# Patient Record
Sex: Female | Born: 1940
Health system: Southern US, Community
[De-identification: ages and names within clinical notes are randomized; demographics above are authoritative.]

## PROBLEM LIST (undated history)

## (undated) DIAGNOSIS — C801 Malignant (primary) neoplasm, unspecified: Secondary | ICD-10-CM

## (undated) DIAGNOSIS — E785 Hyperlipidemia, unspecified: Secondary | ICD-10-CM

## (undated) DIAGNOSIS — R569 Unspecified convulsions: Secondary | ICD-10-CM

## (undated) DIAGNOSIS — N189 Chronic kidney disease, unspecified: Secondary | ICD-10-CM

## (undated) DIAGNOSIS — M1990 Unspecified osteoarthritis, unspecified site: Secondary | ICD-10-CM

## (undated) DIAGNOSIS — D472 Monoclonal gammopathy: Secondary | ICD-10-CM

## (undated) DIAGNOSIS — I639 Cerebral infarction, unspecified: Secondary | ICD-10-CM

## (undated) DIAGNOSIS — I10 Essential (primary) hypertension: Secondary | ICD-10-CM

## (undated) DIAGNOSIS — E079 Disorder of thyroid, unspecified: Secondary | ICD-10-CM

## (undated) DIAGNOSIS — H269 Unspecified cataract: Secondary | ICD-10-CM

## (undated) HISTORY — PX: THYROID SURGERY: SHX805

## (undated) HISTORY — DX: Cerebral infarction, unspecified: I63.9

## (undated) HISTORY — PX: TUBAL LIGATION: SHX77

## (undated) HISTORY — DX: Unspecified convulsions: R56.9

## (undated) HISTORY — DX: Unspecified osteoarthritis, unspecified site: M19.90

## (undated) HISTORY — DX: Disorder of thyroid, unspecified: E07.9

## (undated) HISTORY — DX: Hyperlipidemia, unspecified: E78.5

## (undated) HISTORY — PX: HERNIA REPAIR: SHX51

## (undated) HISTORY — DX: Chronic kidney disease, unspecified: N18.9

## (undated) HISTORY — PX: EYE SURGERY: SHX253

## (undated) HISTORY — PX: JOINT REPLACEMENT: SHX530

## (undated) HISTORY — DX: Essential (primary) hypertension: I10

## (undated) HISTORY — DX: Malignant (primary) neoplasm, unspecified: C80.1

## (undated) HISTORY — PX: TONSILLECTOMY: SUR1361

## (undated) HISTORY — DX: Unspecified cataract: H26.9

## (undated) HISTORY — PX: CHOLECYSTECTOMY: SHX55

## (undated) HISTORY — PX: TOTAL ABDOMINAL HYSTERECTOMY: SHX209

## (undated) HISTORY — DX: Monoclonal gammopathy: D47.2

---

## 1992-03-10 HISTORY — PX: ROTATOR CUFF REPAIR: SHX139

## 1997-10-25 ENCOUNTER — Ambulatory Visit (HOSPITAL_COMMUNITY): Admission: RE | Admit: 1997-10-25 | Discharge: 1997-10-25 | Payer: Self-pay | Admitting: Cardiology

## 1997-12-18 ENCOUNTER — Other Ambulatory Visit: Admission: RE | Admit: 1997-12-18 | Discharge: 1997-12-18 | Payer: Self-pay | Admitting: Obstetrics and Gynecology

## 1998-03-22 ENCOUNTER — Ambulatory Visit (HOSPITAL_COMMUNITY): Admission: RE | Admit: 1998-03-22 | Discharge: 1998-03-22 | Payer: Self-pay | Admitting: Cardiology

## 1999-01-03 ENCOUNTER — Ambulatory Visit (HOSPITAL_COMMUNITY): Admission: RE | Admit: 1999-01-03 | Discharge: 1999-01-03 | Payer: Self-pay | Admitting: Cardiology

## 1999-01-04 ENCOUNTER — Encounter: Payer: Self-pay | Admitting: Cardiology

## 1999-01-10 ENCOUNTER — Ambulatory Visit (HOSPITAL_COMMUNITY): Admission: RE | Admit: 1999-01-10 | Discharge: 1999-01-10 | Payer: Self-pay | Admitting: Cardiology

## 2000-12-01 ENCOUNTER — Other Ambulatory Visit: Admission: RE | Admit: 2000-12-01 | Discharge: 2000-12-01 | Payer: Self-pay | Admitting: Obstetrics and Gynecology

## 2001-04-07 ENCOUNTER — Encounter: Payer: Self-pay | Admitting: Gastroenterology

## 2001-04-07 ENCOUNTER — Encounter: Admission: RE | Admit: 2001-04-07 | Discharge: 2001-04-07 | Payer: Self-pay | Admitting: Gastroenterology

## 2001-05-10 ENCOUNTER — Encounter: Payer: Self-pay | Admitting: Surgery

## 2001-05-17 ENCOUNTER — Inpatient Hospital Stay (HOSPITAL_COMMUNITY): Admission: RE | Admit: 2001-05-17 | Discharge: 2001-05-20 | Payer: Self-pay | Admitting: Surgery

## 2002-11-09 ENCOUNTER — Ambulatory Visit (HOSPITAL_COMMUNITY): Admission: RE | Admit: 2002-11-09 | Discharge: 2002-11-09 | Payer: Self-pay | Admitting: Gastroenterology

## 2002-11-09 ENCOUNTER — Encounter (INDEPENDENT_AMBULATORY_CARE_PROVIDER_SITE_OTHER): Payer: Self-pay | Admitting: Specialist

## 2002-12-12 ENCOUNTER — Encounter: Admission: RE | Admit: 2002-12-12 | Discharge: 2002-12-12 | Payer: Self-pay | Admitting: Gastroenterology

## 2002-12-12 ENCOUNTER — Encounter: Payer: Self-pay | Admitting: Gastroenterology

## 2003-02-15 ENCOUNTER — Encounter (HOSPITAL_COMMUNITY): Admission: RE | Admit: 2003-02-15 | Discharge: 2003-05-16 | Payer: Self-pay | Admitting: Cardiology

## 2004-05-30 ENCOUNTER — Encounter (HOSPITAL_COMMUNITY): Admission: RE | Admit: 2004-05-30 | Discharge: 2004-08-28 | Payer: Self-pay | Admitting: Cardiology

## 2004-06-11 ENCOUNTER — Inpatient Hospital Stay (HOSPITAL_BASED_OUTPATIENT_CLINIC_OR_DEPARTMENT_OTHER): Admission: RE | Admit: 2004-06-11 | Discharge: 2004-06-11 | Payer: Self-pay | Admitting: Cardiology

## 2009-05-02 ENCOUNTER — Encounter: Admission: RE | Admit: 2009-05-02 | Discharge: 2009-05-02 | Payer: Self-pay | Admitting: Orthopedic Surgery

## 2009-12-08 DIAGNOSIS — N189 Chronic kidney disease, unspecified: Secondary | ICD-10-CM

## 2009-12-08 HISTORY — DX: Chronic kidney disease, unspecified: N18.9

## 2010-02-14 ENCOUNTER — Ambulatory Visit: Payer: Self-pay | Admitting: Oncology

## 2010-03-10 DIAGNOSIS — D472 Monoclonal gammopathy: Secondary | ICD-10-CM

## 2010-03-10 HISTORY — DX: Monoclonal gammopathy: D47.2

## 2010-03-14 LAB — CBC WITH DIFFERENTIAL/PLATELET
BASO%: 1.1 % (ref 0.0–2.0)
Basophils Absolute: 0.1 10*3/uL (ref 0.0–0.1)
EOS%: 2.8 % (ref 0.0–7.0)
Eosinophils Absolute: 0.3 10*3/uL (ref 0.0–0.5)
HCT: 33.3 % — ABNORMAL LOW (ref 34.8–46.6)
HGB: 11.1 g/dL — ABNORMAL LOW (ref 11.6–15.9)
LYMPH%: 34.8 % (ref 14.0–49.7)
MCH: 32.8 pg (ref 25.1–34.0)
MCHC: 33.5 g/dL (ref 31.5–36.0)
MCV: 97.9 fL (ref 79.5–101.0)
MONO#: 0.6 10*3/uL (ref 0.1–0.9)
MONO%: 6.4 % (ref 0.0–14.0)
NEUT#: 5.3 10*3/uL (ref 1.5–6.5)
NEUT%: 54.9 % (ref 38.4–76.8)
Platelets: 303 10*3/uL (ref 145–400)
RBC: 3.4 10*6/uL — ABNORMAL LOW (ref 3.70–5.45)
RDW: 12.5 % (ref 11.2–14.5)
WBC: 9.7 10*3/uL (ref 3.9–10.3)
lymph#: 3.4 10*3/uL — ABNORMAL HIGH (ref 0.9–3.3)

## 2010-03-18 LAB — COMPREHENSIVE METABOLIC PANEL
ALT: 27 U/L (ref 0–35)
AST: 31 U/L (ref 0–37)
Albumin: 4 g/dL (ref 3.5–5.2)
Alkaline Phosphatase: 37 U/L — ABNORMAL LOW (ref 39–117)
BUN: 37 mg/dL — ABNORMAL HIGH (ref 6–23)
CO2: 20 mEq/L (ref 19–32)
Calcium: 9.5 mg/dL (ref 8.4–10.5)
Chloride: 110 mEq/L (ref 96–112)
Creatinine, Ser: 1.92 mg/dL — ABNORMAL HIGH (ref 0.40–1.20)
Glucose, Bld: 96 mg/dL (ref 70–99)
Potassium: 4.6 mEq/L (ref 3.5–5.3)
Sodium: 141 mEq/L (ref 135–145)
Total Bilirubin: 0.4 mg/dL (ref 0.3–1.2)
Total Protein: 6.5 g/dL (ref 6.0–8.3)

## 2010-03-18 LAB — IMMUNOFIXATION ELECTROPHORESIS
IgA: 179 mg/dL (ref 68–378)
IgG (Immunoglobin G), Serum: 981 mg/dL (ref 694–1618)
IgM, Serum: 49 mg/dL — ABNORMAL LOW (ref 60–263)
Total Protein, Serum Electrophoresis: 6.5 g/dL (ref 6.0–8.3)

## 2010-03-18 LAB — LACTATE DEHYDROGENASE: LDH: 98 U/L (ref 94–250)

## 2010-03-18 LAB — CREATININE CLEARANCE, URINE, 24 HOUR
Collection Interval-CRCL: 24 hours
Creatinine Clearance: 36 mL/min — ABNORMAL LOW (ref 75–115)
Creatinine, 24H Ur: 921 mg/d (ref 700–1800)
Creatinine, Urine: 141.7 mg/dL
Creatinine: 1.77 mg/dL — ABNORMAL HIGH (ref 0.40–1.20)
Urine Total Volume-CRCL: 650 mL

## 2010-03-18 LAB — BETA 2 MICROGLOBULIN, SERUM: Beta-2 Microglobulin: 6.34 mg/L — ABNORMAL HIGH (ref 1.01–1.73)

## 2010-03-18 LAB — KAPPA/LAMBDA LIGHT CHAINS
Kappa free light chain: 16.8 mg/dL — ABNORMAL HIGH (ref 0.33–1.94)
Kappa:Lambda Ratio: 15.27 — ABNORMAL HIGH (ref 0.26–1.65)
Lambda Free Lght Chn: 1.1 mg/dL (ref 0.57–2.63)

## 2010-03-20 LAB — UIFE/LIGHT CHAINS/TP QN, 24-HR UR
Albumin, U: DETECTED
Alpha 1, Urine: DETECTED — AB
Alpha 2, Urine: DETECTED — AB
Beta, Urine: DETECTED — AB
Free Kappa Lt Chains,Ur: 9.56 mg/dL — ABNORMAL HIGH (ref 0.04–1.51)
Free Kappa/Lambda Ratio: 35.41 ratio — ABNORMAL HIGH (ref 0.46–4.00)
Free Lambda Excretion/Day: 1.76 mg/d
Free Lambda Lt Chains,Ur: 0.27 mg/dL (ref 0.08–1.01)
Free Lt Chn Excr Rate: 62.14 mg/d
Gamma Globulin, Urine: DETECTED — AB
Time: 24 hours
Total Protein, Urine-Ur/day: 70 mg/d (ref 10–140)
Total Protein, Urine: 10.8 mg/dL
Volume, Urine: 650 mL

## 2010-03-31 ENCOUNTER — Encounter: Payer: Self-pay | Admitting: Cardiology

## 2010-05-10 ENCOUNTER — Encounter (HOSPITAL_BASED_OUTPATIENT_CLINIC_OR_DEPARTMENT_OTHER): Payer: Medicare Other | Admitting: Oncology

## 2010-05-10 ENCOUNTER — Other Ambulatory Visit (HOSPITAL_COMMUNITY): Payer: Self-pay | Admitting: Oncology

## 2010-05-10 DIAGNOSIS — D472 Monoclonal gammopathy: Secondary | ICD-10-CM

## 2010-05-10 DIAGNOSIS — N289 Disorder of kidney and ureter, unspecified: Secondary | ICD-10-CM

## 2010-05-10 DIAGNOSIS — D649 Anemia, unspecified: Secondary | ICD-10-CM

## 2010-05-10 LAB — CBC WITH DIFFERENTIAL/PLATELET
Basophils Absolute: 0 10*3/uL (ref 0.0–0.1)
LYMPH%: 30.6 % (ref 14.0–49.7)
MCHC: 32.8 g/dL (ref 31.5–36.0)
MONO#: 0.5 10*3/uL (ref 0.1–0.9)
MONO%: 5.8 % (ref 0.0–14.0)
Platelets: 282 10*3/uL (ref 145–400)
RBC: 3.37 10*6/uL — ABNORMAL LOW (ref 3.70–5.45)
RDW: 13.3 % (ref 11.2–14.5)
WBC: 9.2 10*3/uL (ref 3.9–10.3)

## 2010-05-10 LAB — IGG, IGA, IGM: IgM, Serum: 50 mg/dL — ABNORMAL LOW (ref 60–263)

## 2010-05-10 LAB — IRON AND TIBC
%SAT: 35 % (ref 20–55)
Iron: 149 ug/dL — ABNORMAL HIGH (ref 42–145)
TIBC: 429 ug/dL (ref 250–470)
UIBC: 280 ug/dL

## 2010-05-10 LAB — COMPREHENSIVE METABOLIC PANEL
AST: 22 U/L (ref 0–37)
Albumin: 4.4 g/dL (ref 3.5–5.2)
BUN: 36 mg/dL — ABNORMAL HIGH (ref 6–23)
Glucose, Bld: 96 mg/dL (ref 70–99)
Potassium: 4.7 mEq/L (ref 3.5–5.3)
Total Bilirubin: 0.4 mg/dL (ref 0.3–1.2)

## 2010-05-10 LAB — SEDIMENTATION RATE: Sed Rate: 17 mm/hr (ref 0–22)

## 2010-07-12 ENCOUNTER — Other Ambulatory Visit (HOSPITAL_COMMUNITY): Payer: Self-pay | Admitting: Oncology

## 2010-07-12 ENCOUNTER — Encounter (HOSPITAL_BASED_OUTPATIENT_CLINIC_OR_DEPARTMENT_OTHER): Payer: Medicare Other | Admitting: Oncology

## 2010-07-12 DIAGNOSIS — E119 Type 2 diabetes mellitus without complications: Secondary | ICD-10-CM

## 2010-07-12 DIAGNOSIS — D649 Anemia, unspecified: Secondary | ICD-10-CM

## 2010-07-12 DIAGNOSIS — N289 Disorder of kidney and ureter, unspecified: Secondary | ICD-10-CM

## 2010-07-12 DIAGNOSIS — D472 Monoclonal gammopathy: Secondary | ICD-10-CM

## 2010-07-12 DIAGNOSIS — E559 Vitamin D deficiency, unspecified: Secondary | ICD-10-CM

## 2010-07-12 LAB — CBC WITH DIFFERENTIAL/PLATELET
BASO%: 0.7 % (ref 0.0–2.0)
EOS%: 2.3 % (ref 0.0–7.0)
MCH: 32.9 pg (ref 25.1–34.0)
MCHC: 33.2 g/dL (ref 31.5–36.0)
MONO#: 0.5 10*3/uL (ref 0.1–0.9)
NEUT#: 5 10*3/uL (ref 1.5–6.5)
RBC: 3.3 10*6/uL — ABNORMAL LOW (ref 3.70–5.45)
RDW: 13.3 % (ref 11.2–14.5)
lymph#: 2.7 10*3/uL (ref 0.9–3.3)

## 2010-07-12 LAB — PROTEIN / CREATININE RATIO, URINE
Creatinine, Urine: 114.4 mg/dL
Protein Creatinine Ratio: 0.06 (ref <0.15)
Total Protein, Urine: 7 mg/dL

## 2010-07-15 LAB — COMPREHENSIVE METABOLIC PANEL
Albumin: 4.2 g/dL (ref 3.5–5.2)
BUN: 34 mg/dL — ABNORMAL HIGH (ref 6–23)
CO2: 16 mEq/L — ABNORMAL LOW (ref 19–32)
Calcium: 9.2 mg/dL (ref 8.4–10.5)
Chloride: 110 mEq/L (ref 96–112)
Potassium: 4.6 mEq/L (ref 3.5–5.3)
Sodium: 144 mEq/L (ref 135–145)
Total Protein: 6.9 g/dL (ref 6.0–8.3)

## 2010-07-15 LAB — IGG, IGA, IGM
IgG (Immunoglobin G), Serum: 959 mg/dL (ref 700–1600)
IgM, Serum: 58 mg/dL — ABNORMAL LOW (ref 60–263)

## 2010-07-15 LAB — KAPPA/LAMBDA LIGHT CHAINS: Lambda Free Lght Chn: 1.32 mg/dL (ref 0.57–2.63)

## 2010-07-26 NOTE — Discharge Summary (Signed)
[facility]  Patient:    Rita Yoder, Rita Yoder [facility] Visit Number: [id number] MRN: [id number]          Service Type: SUR Location: 3W [id number] Attending Physician:  Harl Bowie Dictated by:   Coralie Keens, M.D. Admit Date:  05/17/2001 Discharge Date: 05/20/2001                             Discharge Summary  DISCHARGE DIAGNOSES: 1. Ventral incisional hernia, status post laparoscopic, converted to open,    repair with mesh. 2. Morbid obesity.  HISTORY OF PRESENT ILLNESS:  Rita Yoder is a pleasant 70 year old female who presents with a ventral incisional hernia.  She is admitted for elective repair of this.  HOSPITAL COURSE:  The patient was admitted on May 17, 2001, and was taken to the operating room.  She underwent a laparoscopic, converted to open, ventral hernia repair with mesh.  A 12 x 12 cm Bard composite mesh was used.  She tolerated the procedure well and was taken in stable condition to a regular surgical floor.  Postoperative day 1 she was doing very well and was comfortable.  At this point her Foley catheter was removed, an abdominal binder was placed for comfort.  By postoperative day 2 she was still ambulating poorly but was improving.  Her ileus was resolving, and ambulation was encouraged.  On postoperative day 3 she was tolerating a regular diet, was having bowel movements, was tolerating oral pain medications, her incisions were healing well, and the decision was made to discharge the patient to home.  DISCHARGE INSTRUCTIONS:  Discharge diet:  Regular.  Discharge activity:  She is to do no heavy lifting for six weeks.  DISCHARGE MEDICATIONS:  She will resume her home medications.  She will take Tylox and ibuprofen as pain.  She will take milk of magnesia for constipation. I have put her on Keflex 500 mg three times a day for five days.  She may shower.  She will follow up in my office in five days for staple  removal. Dictated by:   Coralie Keens, M.D. Attending Physician:  Harl Bowie DD:  06/07/01 TD:  06/07/01 Job: [id number]

## 2010-07-26 NOTE — Op Note (Signed)
[facility]  Patient:    Rita Yoder, SALSBERRY [facility] Visit Number: [id number] MRN: [id number]          Service Type: SUR Location: 3W [id number] Attending Physician:  Harl Bowie Dictated by:   Marco Collie Ninfa Linden, M.D. Proc. Date: 05/17/01 Admit Date:  05/17/2001                             Operative Report  PREOPERATIVE DIAGNOSIS:  Ventral incisional hernia.  POSTOPERATIVE DIAGNOSIS:  Ventral incisional hernia.  PROCEDURE:  Laparoscopic converted to open ventral hernia repair with mesh.  SURGEON:  Douglas A. Ninfa Linden, M.D.  ASSISTANT:  Vernona Rieger, M.D.  ANESTHESIA:  General endotracheal.  ESTIMATED BLOOD LOSS:  Minimal.  INDICATIONS:  Rita Yoder is a 70 year old female, who has previous hysterectomy as well as an open cholecystectomy in the past through midline incisions.  She now presents with abdominal pain and an easily reducible ventral hernia.  PROCEDURE IN DETAIL:  The patient was brought to the operating room and identified as Rita Yoder.  She was placed supine on the operating room table, and general anesthesia was induced.  Her abdomen was then prepped and draped in the usual sterile fashion.  Using a #15 blade, a small transverse incision was made in the patients left upper quadrant.  The incision was carried down through the fascial layers with scalpel.  The peritoneum was identified and opened.  A pursestring suture was then placed around the fascia opening with 0 Vicryl.  The Hasson port was placed through the opening and insufflation of the abdomen was begun.  The camera was then inserted, and the patient was found to have a large amount of omentum caked to the abdominal wall.  A separate 5 mm port was placed in the patients epigastrium under direct vision; however, with blunt dissection, the omentum could not be taken away from the abdominal wall safely in order to perform the procedure laparoscopically.  At  this point, the decision was made to terminate the laparoscopic procedure and convert to an open procedure.  The patients midline was then opened around the umbilicus with a 123XX123 scalpel.  Incision was carried down to the hernia sac which was easily identified and opened.  The patients hernia sac was found to contain omentum as well as small intestines. The hernia sac was freed up circumferentially along with adhesions with the electrocautery as well as clamps and silk tied.  The contents could finally be reduced into the abdominal cavity.  Next, a piece of composite mesh from Bard in a 12 x 12 fashion was brought onto the field.  The mesh was passed through the hernia defect and then tacked circumferentially to the fascia.  Then #1 Novofil sutures were used circumferentially to tack the mesh to the fascia as well.  Excellent coverage of the hernia defect appeared to be achieved circumferentially.  Next, the subcutaneous layer was closed with interrupted 3-0 Vicryl sutures, and the skin was closed with skin staples.  The fascial defect at the camera port site was closed with figure-of-eight #1 Novofil sutures as well.  The skin here was closed with skin staples, too.  The patient tolerated the procedure well.  All sponge, needle, and instrument counts were correct at the end of the procedure.  The patient was then extubated in the operating room and taken in stable condition to the recovery room. Dictated by:   Marco Collie  Ninfa Linden, M.D. Attending Physician:  Harl Bowie DD:  05/17/01 TD:  05/17/01 Job: [id number]

## 2010-07-26 NOTE — Op Note (Signed)
NAME:  Rita Yoder, Rita Yoder                      ACCOUNT NO.:  [id number]   MEDICAL RECORD NO.:  [id number]                   PATIENT TYPE:  AMB   LOCATION:  ENDO                                 FACILITY:  [facility]   PHYSICIAN:  Nelwyn Salisbury, M.D.               DATE OF BIRTH:  31-Jul-1940   DATE OF PROCEDURE:  11/09/2002  DATE OF DISCHARGE:                                 OPERATIVE REPORT   PROCEDURE PERFORMED:  Colonoscopy with snare polypectomy times one and cold  biopsies times two.   ENDOSCOPIST:  Juanita Craver, M.D.   INSTRUMENT USED:  Olympus video colonoscope.   INDICATIONS FOR PROCEDURE:  The patient is a 70 year old African-American  female with history of change in bowel habits.  Rule out colonic polyps,  masses, etc.   PREPROCEDURE PREPARATION:  Informed consent was procured from the patient.  The patient was fasted for eight hours prior to the procedure and prepped  with a bottle of magnesium citrate and a gallon of GoLYTELY the night prior  to the procedure.   PREPROCEDURE PHYSICAL:  The patient had stable vital signs.  Neck supple.  Chest clear to auscultation.  S1 and S2 regular.  Abdomen soft with normal  bowel sounds.   DESCRIPTION OF PROCEDURE:  The patient was placed in left lateral decubitus  position and sedated with an additional 30 mg of Demerol and 3 mg of Versed  intravenously.  Once the patient was adequately sedated and maintained on  low flow oxygen and continuous cardiac monitoring, the Olympus video  colonoscope was advanced form the rectum to the cecum with difficulty.  There was a large amount of residual stool in the colon and multiple washes  were done.  A small sessile polyp was snared from the proximal right colon  but was lost in the stool.  Two small sessile polyps were biopsied from the  cecum.  There was evidence of scattered diverticulosis especially in the  left colon.  No large masses or polyps were present.  Retroflexion in the  rectum  revealed no abnormalities.   IMPRESSION:  1. Multiple colonic polyps (see description above) removed by biopsies and     snare polypectomy.  2. Scattered diverticulosis with more prominent changes in the sigmoid     colon.  3. Significant amount of residual stool in the colon, small lesions could     have been missed.   RECOMMENDATIONS:  1. Await pathology results.  2. A high fiber diet with liberal fluid intake.  3. Outpatient follow-up in the next two weeks for further recommendations.                                                   Nelwyn Salisbury, M.D.    JNM/MEDQ  D:  11/09/2002  T:  11/10/2002  Job:  [id number]   cc:   Allegra Lai. Terrence Dupont, M.D.  [address]  [place] [address]  Fax: [phone]

## 2010-07-26 NOTE — Cardiovascular Report (Signed)
Rita Yoder, Rita Yoder              ACCOUNT NO.:  [id number]   MEDICAL RECORD NO.:  [id number]          PATIENT TYPE:  OIB   LOCATION:  6501                         FACILITY:  [place]   PHYSICIAN:  Mohan N. Terrence Dupont, M.D. DATE OF BIRTH:  10/31/1940   DATE OF PROCEDURE:  06/11/2004  DATE OF DISCHARGE:                              CARDIAC CATHETERIZATION   PROCEDURE:  Left cardiac cath with selective left and right coronary  angiograph, left ventriculography via right groin using Judkins technique.   INDICATIONS FOR PROCEDURE:  Rita Yoder is a 70 year old black female with  past medical history significant for hypertension, history of mitral valve  prolapse, morbid obesity, hiatus hernia, hypercholesteremia, non-insulin-  dependent diabetes mellitus controlled by diet. Complains of retrosternal  chest pain off and on grade 3/10, relieved with sublingual nitro without  associated nausea, vomiting or diaphoresis. States chest pain lasts for 10-  15 minutes. Denies relation of chest pain to food, breathing or lying down.  The patient underwent Persantine Myoview on May 30, 2004, which showed  reversible ischemia of the anterior wall with EF of 61%. Due to multiple  risk factors and chest pain and positive Persantine Myoview, I discussed  with the patient regarding left cath and consents for the procedure.   PAST MEDICAL HISTORY:  As above.   PAST SURGICAL HISTORY:  1.  She had right shoulder surgery right shoulder rotator cuff repair in      1994.  2.  Subtotally pallidectomy 30+ years ago.  3.  Hysterectomy for fibroid tumor many years ago.   MEDICATIONS AT HOME:  1.  Toprol XL 100 mg p.o. daily.  2.  Avalide 300/12.5 mg p.o. daily.  3.  Norvasc 5 mg  p.o. daily.  4.  Cardura 4 mg p.o. q.h.s.  5.  Ditropan XL 10 mg p.o. daily.  6.  Lipitor 40 mg p.o. daily.  7.  Baby aspirin 81 mg p.o. daily.  8.  Nitrostat sublingual p.r.n.   ALLERGY:  No known drug allergies.   She is  married. His four children.  Born in [place], lives in  [place], [place].  No history of smoking or alcohol abuse.   FAMILY HISTORY:  Negative for coronary artery disease.   PHYSICAL EXAMINATION:  GENERAL:  She is alert, awake and oriented x3 in no  acute distress.  VITAL SIGNS:  Blood pressure was 140/80, pulse was 52 regular and  conjunctiva pink.  NECK: Supple. No JVD, no bruit.  LUNGS:  Clear to auscultation without rhonchi or rales.  CARDIOVASCULAR:  S1 and S2 was soft. There was no S3 gallop.  ABDOMEN:  Soft. Bowel sounds present, nontender.  EXTREMITIES: There is no clubbing, cyanosis or edema.   Discussed with the patient regarding abnormal Persantine Myoview and left  cath, its risks, i.e. death, MI, stroke, need for emergency CABG, local  vascular complications, etc. and consented for the procedure.   PROCEDURE:  After obtaining informed consent the patient was brought to the  cath lab and was placed on fluoroscopy table. Right groin was prepped and  draped in usual fashion.  Xylocaine 2% was used for local anesthesia in the  right groin.  With the help of thin-wall needle, 4-French arterial sheath  was placed. The sheath was aspirated and flushed. Next, 4-French left  Judkins catheter was advanced over the wire under fluoroscopic guidance up  to the ascending aorta. Wire was pulled out, the catheter was aspirated and  connected to the manifold. Catheter was further advanced and engaged into  left coronary ostium. Multiple views of the left system were taken. Next,  the catheter was disengaged and was pulled out over the wire was replaced  with 4-French right Judkins catheter which was advanced over the wire under  fluoroscopic guidance into the ascending aorta. Wire was pulled out, the  catheter was aspirated and connected to the manifold. Catheter was further  advanced and engaged into right coronary ostium.  Multiple views of the  right system were taken.  Next, the catheter was disengaged and was pulled  out over the wire was replaced with 4-French pigtail catheter which was  advanced over the wire under fluoroscopic guidance up to the ascending  aorta.  The wire was pulled out, the catheter was aspirated and connected to  the manifold. Catheter was further advanced across aortic valve into the LV.  LV pressures were recorded. Next, left ventriculography was done in 30  degree RAO position. Post angiographic pressures were recorded from LV and  then pullback pressures were recorded from the aorta. There was no gradient  across aortic valve. Next, a pigtail catheter was pulled out over the wire.  Sheaths aspirated and flushed.   FINDINGS:  LV showed good LV systolic function, EF of 99991111. There was 2+  MR.  Left main was long which was patent. LAD had 10-15% mid stenosis.  Diagonal I was moderate size which was patent. Diagonal II and III were  small which were patent. Left circumflex was patent. OM I was patent. Left  circumflex was patent. The patient has right dominant coronary system. The  patient tolerated procedure well. There were no complications. The patient  was transferred to recovery room in stable condition.      MNH/MEDQ  D:  06/11/2004  T:  06/11/2004  Job:  [id number]   cc:   Cath Lab

## 2010-07-26 NOTE — Op Note (Signed)
   NAME:  Rita Yoder, Rita Yoder                      ACCOUNT NO.:  [id number]   MEDICAL RECORD NO.:  [id number]                   PATIENT TYPE:  AMB   LOCATION:  ENDO                                 FACILITY:  [facility]   PHYSICIAN:  Nelwyn Salisbury, M.D.               DATE OF BIRTH:  Jun 07, 1940   DATE OF PROCEDURE:  11/09/2002  DATE OF DISCHARGE:                                 OPERATIVE REPORT   PROCEDURE PERFORMED:  Esophagogastroduodenoscopy.   ENDOSCOPIST:  Nelwyn Salisbury, M.D.   INSTRUMENT USED:  Olympus video panendoscope.   INDICATION FOR PROCEDURE:  A 70 year old African-American female with a  history of epigastric pain, status post cholecystectomy.  Rule out peptic  ulcer disease, esophagitis, gastritis, etc.   PREPROCEDURE PREPARATION:  Informed consent was procured from the patient.  The patient was fasted for eight hours prior to the procedure.   PREPROCEDURE PHYSICAL:  VITAL SIGNS:  The patient had stable vital signs.  NECK:  Supple.  CHEST:  Clear to auscultation.  S1, S2 regular.  ABDOMEN:  Obese with normal bowel sounds.   DESCRIPTION OF PROCEDURE:  The patient was placed in the left lateral  decubitus position and sedated with 100 mg of Demerol and 10 mg of Versed in  slow incremental doses.  Once the patient was adequately sedate and  maintained on low-flow oxygen and continuous cardiac monitoring, the Olympus  video panendoscope was advanced through the mouthpiece, over the tongue,  into the esophagus under direct vision.  The entire esophagus appeared  normal with a healthy Z-line.  No esophagitis, Barrett's mucosa, or  stricture was seen.  The scope was then advanced to the stomach.  There was  antral gastritis noted.  Retroflexion in the high cardia did not reveal a  hiatal hernia.  The rest of the gastric mucosa appeared healthy, and so did  the proximal small bowel.   IMPRESSION:  Mild antral gastritis, otherwise normal  esophagogastroduodenoscopy.    RECOMMENDATIONS:  Proceed with a colonoscopy at this time.  Further  recommendations will be made thereafter.                                               Nelwyn Salisbury, M.D.    JNM/MEDQ  D:  11/09/2002  T:  11/10/2002  Job:  [id number]   cc:   Allegra Lai. Terrence Dupont, M.D.  [address]  [place] [address]  Fax: [phone]

## 2010-10-18 ENCOUNTER — Other Ambulatory Visit (HOSPITAL_COMMUNITY): Payer: Self-pay | Admitting: Oncology

## 2010-10-18 ENCOUNTER — Encounter (HOSPITAL_BASED_OUTPATIENT_CLINIC_OR_DEPARTMENT_OTHER): Payer: Medicare Other | Admitting: Oncology

## 2010-10-18 DIAGNOSIS — D472 Monoclonal gammopathy: Secondary | ICD-10-CM

## 2010-10-18 DIAGNOSIS — N289 Disorder of kidney and ureter, unspecified: Secondary | ICD-10-CM

## 2010-10-18 DIAGNOSIS — D649 Anemia, unspecified: Secondary | ICD-10-CM

## 2010-10-18 LAB — PROTEIN / CREATININE RATIO, URINE
Creatinine, Urine: 215.7 mg/dL
Protein Creatinine Ratio: 0.07 (ref <0.15)
Total Protein, Urine: 15 mg/dL

## 2010-10-18 LAB — CBC WITH DIFFERENTIAL/PLATELET
BASO%: 0.4 % (ref 0.0–2.0)
Basophils Absolute: 0 10*3/uL (ref 0.0–0.1)
EOS%: 2.7 % (ref 0.0–7.0)
Eosinophils Absolute: 0.2 10*3/uL (ref 0.0–0.5)
MCH: 31.6 pg (ref 25.1–34.0)
MCHC: 31.8 g/dL (ref 31.5–36.0)
MONO#: 0.4 10*3/uL (ref 0.1–0.9)
RBC: 3.35 10*6/uL — ABNORMAL LOW (ref 3.70–5.45)
RDW: 13 % (ref 11.2–14.5)

## 2010-10-21 LAB — KAPPA/LAMBDA LIGHT CHAINS: Kappa free light chain: 40.7 mg/dL — ABNORMAL HIGH (ref 0.33–1.94)

## 2010-10-21 LAB — IGG, IGA, IGM: IgG (Immunoglobin G), Serum: 1050 mg/dL (ref 690–1700)

## 2010-10-21 LAB — LACTATE DEHYDROGENASE: LDH: 114 U/L (ref 94–250)

## 2010-10-21 LAB — COMPREHENSIVE METABOLIC PANEL
ALT: 20 U/L (ref 0–35)
AST: 23 U/L (ref 0–37)
Albumin: 4.3 g/dL (ref 3.5–5.2)
CO2: 21 mEq/L (ref 19–32)
Calcium: 9.7 mg/dL (ref 8.4–10.5)
Chloride: 109 mEq/L (ref 96–112)
Creatinine, Ser: 1.44 mg/dL — ABNORMAL HIGH (ref 0.50–1.10)
Potassium: 4.4 mEq/L (ref 3.5–5.3)
Sodium: 144 mEq/L (ref 135–145)
Total Protein: 6.8 g/dL (ref 6.0–8.3)

## 2010-12-25 ENCOUNTER — Encounter: Payer: Self-pay | Admitting: Medical Oncology

## 2011-01-20 ENCOUNTER — Other Ambulatory Visit: Payer: Medicare Other | Admitting: Lab

## 2011-01-20 ENCOUNTER — Ambulatory Visit: Payer: Medicare Other | Admitting: Oncology

## 2013-04-20 DIAGNOSIS — N183 Chronic kidney disease, stage 3 unspecified: Secondary | ICD-10-CM | POA: Insufficient documentation

## 2013-05-19 ENCOUNTER — Ambulatory Visit: Payer: Self-pay | Admitting: Podiatry

## 2015-01-18 DIAGNOSIS — M545 Low back pain: Secondary | ICD-10-CM | POA: Diagnosis not present

## 2015-01-18 DIAGNOSIS — N08 Glomerular disorders in diseases classified elsewhere: Secondary | ICD-10-CM | POA: Diagnosis not present

## 2015-01-18 DIAGNOSIS — R634 Abnormal weight loss: Secondary | ICD-10-CM | POA: Diagnosis not present

## 2015-01-18 DIAGNOSIS — E1122 Type 2 diabetes mellitus with diabetic chronic kidney disease: Secondary | ICD-10-CM | POA: Diagnosis not present

## 2015-01-18 DIAGNOSIS — N183 Chronic kidney disease, stage 3 (moderate): Secondary | ICD-10-CM | POA: Diagnosis not present

## 2015-01-18 DIAGNOSIS — E559 Vitamin D deficiency, unspecified: Secondary | ICD-10-CM | POA: Diagnosis not present

## 2015-01-18 DIAGNOSIS — I129 Hypertensive chronic kidney disease with stage 1 through stage 4 chronic kidney disease, or unspecified chronic kidney disease: Secondary | ICD-10-CM | POA: Diagnosis not present

## 2015-04-18 DIAGNOSIS — H6121 Impacted cerumen, right ear: Secondary | ICD-10-CM | POA: Diagnosis not present

## 2015-04-18 DIAGNOSIS — E559 Vitamin D deficiency, unspecified: Secondary | ICD-10-CM | POA: Diagnosis not present

## 2015-04-18 DIAGNOSIS — R5383 Other fatigue: Secondary | ICD-10-CM | POA: Diagnosis not present

## 2015-04-18 DIAGNOSIS — I129 Hypertensive chronic kidney disease with stage 1 through stage 4 chronic kidney disease, or unspecified chronic kidney disease: Secondary | ICD-10-CM | POA: Diagnosis not present

## 2015-04-18 DIAGNOSIS — R51 Headache: Secondary | ICD-10-CM | POA: Diagnosis not present

## 2015-07-05 DIAGNOSIS — N183 Chronic kidney disease, stage 3 (moderate): Secondary | ICD-10-CM | POA: Diagnosis not present

## 2015-07-05 DIAGNOSIS — I129 Hypertensive chronic kidney disease with stage 1 through stage 4 chronic kidney disease, or unspecified chronic kidney disease: Secondary | ICD-10-CM | POA: Diagnosis not present

## 2015-07-31 DIAGNOSIS — E1122 Type 2 diabetes mellitus with diabetic chronic kidney disease: Secondary | ICD-10-CM | POA: Diagnosis not present

## 2015-07-31 DIAGNOSIS — I129 Hypertensive chronic kidney disease with stage 1 through stage 4 chronic kidney disease, or unspecified chronic kidney disease: Secondary | ICD-10-CM | POA: Diagnosis not present

## 2015-07-31 DIAGNOSIS — N08 Glomerular disorders in diseases classified elsewhere: Secondary | ICD-10-CM | POA: Diagnosis not present

## 2015-07-31 DIAGNOSIS — N183 Chronic kidney disease, stage 3 (moderate): Secondary | ICD-10-CM | POA: Diagnosis not present

## 2015-08-08 DIAGNOSIS — E119 Type 2 diabetes mellitus without complications: Secondary | ICD-10-CM | POA: Diagnosis not present

## 2015-08-08 DIAGNOSIS — N182 Chronic kidney disease, stage 2 (mild): Secondary | ICD-10-CM | POA: Diagnosis not present

## 2015-08-08 DIAGNOSIS — I10 Essential (primary) hypertension: Secondary | ICD-10-CM | POA: Diagnosis not present

## 2015-08-16 DIAGNOSIS — M25552 Pain in left hip: Secondary | ICD-10-CM | POA: Diagnosis not present

## 2015-09-14 DIAGNOSIS — M25551 Pain in right hip: Secondary | ICD-10-CM | POA: Diagnosis not present

## 2015-11-16 DIAGNOSIS — M25552 Pain in left hip: Secondary | ICD-10-CM | POA: Diagnosis not present

## 2015-12-24 DIAGNOSIS — Z79899 Other long term (current) drug therapy: Secondary | ICD-10-CM | POA: Diagnosis not present

## 2015-12-24 DIAGNOSIS — N184 Chronic kidney disease, stage 4 (severe): Secondary | ICD-10-CM | POA: Diagnosis not present

## 2015-12-24 DIAGNOSIS — N183 Chronic kidney disease, stage 3 (moderate): Secondary | ICD-10-CM | POA: Diagnosis not present

## 2015-12-24 DIAGNOSIS — Z0000 Encounter for general adult medical examination without abnormal findings: Secondary | ICD-10-CM | POA: Diagnosis not present

## 2015-12-24 DIAGNOSIS — I129 Hypertensive chronic kidney disease with stage 1 through stage 4 chronic kidney disease, or unspecified chronic kidney disease: Secondary | ICD-10-CM | POA: Diagnosis not present

## 2015-12-24 DIAGNOSIS — M25552 Pain in left hip: Secondary | ICD-10-CM | POA: Diagnosis not present

## 2015-12-24 DIAGNOSIS — E1122 Type 2 diabetes mellitus with diabetic chronic kidney disease: Secondary | ICD-10-CM | POA: Diagnosis not present

## 2015-12-31 DIAGNOSIS — Z1231 Encounter for screening mammogram for malignant neoplasm of breast: Secondary | ICD-10-CM | POA: Diagnosis not present

## 2016-02-21 ENCOUNTER — Encounter: Payer: Self-pay | Admitting: Podiatry

## 2016-02-21 ENCOUNTER — Ambulatory Visit (INDEPENDENT_AMBULATORY_CARE_PROVIDER_SITE_OTHER): Payer: Medicare Other | Admitting: Podiatry

## 2016-02-21 ENCOUNTER — Ambulatory Visit (INDEPENDENT_AMBULATORY_CARE_PROVIDER_SITE_OTHER): Payer: Medicare Other

## 2016-02-21 VITALS — BP 161/78 | HR 63 | Resp 16 | Ht 61.0 in | Wt 235.0 lb

## 2016-02-21 DIAGNOSIS — E1140 Type 2 diabetes mellitus with diabetic neuropathy, unspecified: Secondary | ICD-10-CM

## 2016-02-21 DIAGNOSIS — S99921A Unspecified injury of right foot, initial encounter: Secondary | ICD-10-CM

## 2016-02-21 DIAGNOSIS — M779 Enthesopathy, unspecified: Secondary | ICD-10-CM

## 2016-02-21 DIAGNOSIS — M79676 Pain in unspecified toe(s): Secondary | ICD-10-CM

## 2016-02-21 DIAGNOSIS — E1149 Type 2 diabetes mellitus with other diabetic neurological complication: Secondary | ICD-10-CM | POA: Diagnosis not present

## 2016-02-21 DIAGNOSIS — M79604 Pain in right leg: Secondary | ICD-10-CM

## 2016-02-21 DIAGNOSIS — M79605 Pain in left leg: Secondary | ICD-10-CM

## 2016-02-21 DIAGNOSIS — B351 Tinea unguium: Secondary | ICD-10-CM

## 2016-02-21 MED ORDER — TRIAMCINOLONE ACETONIDE 10 MG/ML IJ SUSP
10.0000 mg | Freq: Once | INTRAMUSCULAR | Status: AC
  Administered 2016-02-21: 10 mg

## 2016-02-21 NOTE — Progress Notes (Signed)
   Subjective:    Patient ID: Rita Yoder, female    DOB: 02/13/1941, 75 y.o.   MRN: [id number]  HPI Chief Complaint  Patient presents with  . Foot Injury    Right foot; dorsal; swelling; pt stated, "[place] when trying to get into her car on February 08, 2016"  . Debridement    Bilateral nail trim; pt Diabetic Type 2; Sugar=did not check today; A1C=Does not know      Review of Systems  Constitutional: Positive for fatigue and unexpected weight change.  HENT: Positive for hearing loss.   Cardiovascular: Positive for leg swelling.  Gastrointestinal: Positive for diarrhea.  Endocrine: Positive for cold intolerance and polydipsia.  Genitourinary: Positive for urgency.  Musculoskeletal: Positive for myalgias.  Hematological: Bruises/bleeds easily.  All other systems reviewed and are negative.      Objective:   Physical Exam        Assessment & Plan:

## 2016-02-22 NOTE — Progress Notes (Signed)
Subjective:     Patient ID: Rita Yoder, female   DOB: [birth year], 75 y.o.   MRN: [id number]  HPI patient states she's a long-term diabetic who has pain in the right ankle nail disease that she cannot cut and become bothersome and also needs diabetic shoes and she's had them in the past and they have effectively helped her structure   Review of Systems  All other systems reviewed and are negative.      Objective:   Physical Exam  Constitutional: She is oriented to person, place, and time.  Cardiovascular: Intact distal pulses.   Musculoskeletal: Normal range of motion.  Neurological: She is oriented to person, place, and time.  Skin: Skin is warm and dry.  Nursing note and vitals reviewed.  neurovascular status was found to be diminished with diminished DP PT pulses and patient also noted to have diminished sharp [employer] vibratory. Patient's noted to have digital deformities with rotation keratotic lesion formation that can become painful and also is noted to have nail disease 1-5 both feet that become thickened incurvated and she cannot cut and states that are painful. She has quite a bit of discomfort in the right ankle within the sinus tarsi of a ankle sprain several weeks ago     Assessment:     Sprained ankle diabetic risk patient with digital deformities and diminished neurological vascular status along with nail disease and pain in thickness 1-5 both feet    Plan:     H&P diabetic education rendered and today I went ahead and I debrided nailbeds 1-5 both feet with no iatrogenic bleeding noted and debrided lesions and injected the sinus tarsi right 3 mg Kenalog 5 mg Xylocaine mixture we will get her approved for diabetic shoes  X-ray report indicates there is quite a bit of osteoporosis collapse medial longitudinal arch arthritis but no indication of fracture

## 2016-02-27 DIAGNOSIS — R350 Frequency of micturition: Secondary | ICD-10-CM | POA: Diagnosis not present

## 2016-04-02 ENCOUNTER — Telehealth: Payer: Self-pay | Admitting: *Deleted

## 2016-04-03 NOTE — Telephone Encounter (Signed)
Pt's dtr, Para Skeans states pt received a shot in her ankle at last appt, and had bruising. Ms Sales states pt now has a knot on the bottom of the same foot and wanted to know if pt needed to see a podiatrist. I told her yes a podiatrist would be able to evaluate, and took her the the back reception schedulers to set up an appt.

## 2016-04-10 ENCOUNTER — Ambulatory Visit: Payer: Medicare Other | Admitting: Podiatry

## 2016-05-13 ENCOUNTER — Ambulatory Visit (INDEPENDENT_AMBULATORY_CARE_PROVIDER_SITE_OTHER): Payer: Medicare Other | Admitting: *Deleted

## 2016-05-13 DIAGNOSIS — E1140 Type 2 diabetes mellitus with diabetic neuropathy, unspecified: Secondary | ICD-10-CM

## 2016-05-13 DIAGNOSIS — E1149 Type 2 diabetes mellitus with other diabetic neurological complication: Principal | ICD-10-CM

## 2016-05-13 NOTE — Progress Notes (Signed)
Patient ID: Rita Yoder, female   DOB: 1940/05/21, 76 y.o.   MRN: [id number]  Patient presents to be measured and molded for diabetic shoes and inserts. Ordering [employer]pex Ethel Rana A330W black in 9 wide.

## 2016-06-19 ENCOUNTER — Ambulatory Visit: Payer: Medicare Other

## 2016-06-30 ENCOUNTER — Other Ambulatory Visit: Payer: Medicare Other

## 2016-07-14 ENCOUNTER — Ambulatory Visit (INDEPENDENT_AMBULATORY_CARE_PROVIDER_SITE_OTHER): Payer: Self-pay | Admitting: Podiatry

## 2016-07-14 DIAGNOSIS — E1142 Type 2 diabetes mellitus with diabetic polyneuropathy: Secondary | ICD-10-CM | POA: Diagnosis not present

## 2016-07-14 DIAGNOSIS — M7750 Other enthesopathy of unspecified foot: Secondary | ICD-10-CM | POA: Diagnosis not present

## 2016-07-14 NOTE — Progress Notes (Signed)
Patient presents today to pick up diabetic shoes.  Patient tried on shoes and they fit her well...no concern of heel slippage or areas of pressure...full contact foot orthotics check for fit as well..patient pleased with out come..  Charges a5000 x 2  And a5513 x 6

## 2016-07-17 DIAGNOSIS — I10 Essential (primary) hypertension: Secondary | ICD-10-CM | POA: Diagnosis not present

## 2016-07-17 DIAGNOSIS — N182 Chronic kidney disease, stage 2 (mild): Secondary | ICD-10-CM | POA: Diagnosis not present

## 2016-07-17 DIAGNOSIS — E119 Type 2 diabetes mellitus without complications: Secondary | ICD-10-CM | POA: Diagnosis not present

## 2016-08-07 DIAGNOSIS — M25552 Pain in left hip: Secondary | ICD-10-CM | POA: Diagnosis not present

## 2016-08-18 DIAGNOSIS — Z1389 Encounter for screening for other disorder: Secondary | ICD-10-CM | POA: Diagnosis not present

## 2016-08-18 DIAGNOSIS — I129 Hypertensive chronic kidney disease with stage 1 through stage 4 chronic kidney disease, or unspecified chronic kidney disease: Secondary | ICD-10-CM | POA: Diagnosis not present

## 2016-08-18 DIAGNOSIS — R7309 Other abnormal glucose: Secondary | ICD-10-CM | POA: Diagnosis not present

## 2016-08-18 DIAGNOSIS — R296 Repeated falls: Secondary | ICD-10-CM | POA: Diagnosis not present

## 2016-08-18 DIAGNOSIS — N183 Chronic kidney disease, stage 3 (moderate): Secondary | ICD-10-CM | POA: Diagnosis not present

## 2016-08-21 DIAGNOSIS — R2689 Other abnormalities of gait and mobility: Secondary | ICD-10-CM | POA: Diagnosis not present

## 2016-12-08 DIAGNOSIS — M25552 Pain in left hip: Secondary | ICD-10-CM | POA: Diagnosis not present

## 2017-01-14 DIAGNOSIS — N08 Glomerular disorders in diseases classified elsewhere: Secondary | ICD-10-CM | POA: Diagnosis not present

## 2017-01-14 DIAGNOSIS — I129 Hypertensive chronic kidney disease with stage 1 through stage 4 chronic kidney disease, or unspecified chronic kidney disease: Secondary | ICD-10-CM | POA: Diagnosis not present

## 2017-01-14 DIAGNOSIS — Z1239 Encounter for other screening for malignant neoplasm of breast: Secondary | ICD-10-CM | POA: Diagnosis not present

## 2017-01-14 DIAGNOSIS — E782 Mixed hyperlipidemia: Secondary | ICD-10-CM | POA: Diagnosis not present

## 2017-01-14 DIAGNOSIS — N183 Chronic kidney disease, stage 3 (moderate): Secondary | ICD-10-CM | POA: Diagnosis not present

## 2017-01-14 DIAGNOSIS — Z0100 Encounter for examination of eyes and vision without abnormal findings: Secondary | ICD-10-CM | POA: Diagnosis not present

## 2017-01-14 DIAGNOSIS — R229 Localized swelling, mass and lump, unspecified: Secondary | ICD-10-CM | POA: Diagnosis not present

## 2017-01-14 DIAGNOSIS — Z0000 Encounter for general adult medical examination without abnormal findings: Secondary | ICD-10-CM | POA: Diagnosis not present

## 2017-02-04 ENCOUNTER — Other Ambulatory Visit: Payer: Self-pay | Admitting: Internal Medicine

## 2017-02-04 DIAGNOSIS — E2839 Other primary ovarian failure: Secondary | ICD-10-CM

## 2017-02-07 DIAGNOSIS — Z1231 Encounter for screening mammogram for malignant neoplasm of breast: Secondary | ICD-10-CM | POA: Diagnosis not present

## 2017-02-23 ENCOUNTER — Ambulatory Visit
Admission: RE | Admit: 2017-02-23 | Discharge: 2017-02-23 | Disposition: A | Discharge status: Home / Self Care | Payer: Medicare Other | Source: Ambulatory Visit | Attending: Internal Medicine | Admitting: Internal Medicine

## 2017-02-23 DIAGNOSIS — Z780 Asymptomatic menopausal state: Secondary | ICD-10-CM | POA: Diagnosis not present

## 2017-02-23 DIAGNOSIS — E2839 Other primary ovarian failure: Secondary | ICD-10-CM

## 2017-02-23 DIAGNOSIS — M85851 Other specified disorders of bone density and structure, right thigh: Secondary | ICD-10-CM | POA: Diagnosis not present

## 2017-06-18 DIAGNOSIS — N183 Chronic kidney disease, stage 3 (moderate): Secondary | ICD-10-CM | POA: Diagnosis not present

## 2017-06-18 DIAGNOSIS — M1612 Unilateral primary osteoarthritis, left hip: Secondary | ICD-10-CM | POA: Diagnosis not present

## 2017-06-18 DIAGNOSIS — R946 Abnormal results of thyroid function studies: Secondary | ICD-10-CM | POA: Diagnosis not present

## 2017-06-18 DIAGNOSIS — I129 Hypertensive chronic kidney disease with stage 1 through stage 4 chronic kidney disease, or unspecified chronic kidney disease: Secondary | ICD-10-CM | POA: Diagnosis not present

## 2017-07-07 DIAGNOSIS — H524 Presbyopia: Secondary | ICD-10-CM | POA: Diagnosis not present

## 2017-07-07 DIAGNOSIS — H43811 Vitreous degeneration, right eye: Secondary | ICD-10-CM | POA: Diagnosis not present

## 2017-07-07 DIAGNOSIS — H538 Other visual disturbances: Secondary | ICD-10-CM | POA: Diagnosis not present

## 2017-07-07 DIAGNOSIS — H2510 Age-related nuclear cataract, unspecified eye: Secondary | ICD-10-CM | POA: Diagnosis not present

## 2017-08-20 DIAGNOSIS — I129 Hypertensive chronic kidney disease with stage 1 through stage 4 chronic kidney disease, or unspecified chronic kidney disease: Secondary | ICD-10-CM | POA: Diagnosis not present

## 2017-08-20 DIAGNOSIS — Z1389 Encounter for screening for other disorder: Secondary | ICD-10-CM | POA: Diagnosis not present

## 2017-08-20 DIAGNOSIS — N183 Chronic kidney disease, stage 3 (moderate): Secondary | ICD-10-CM | POA: Diagnosis not present

## 2017-08-20 DIAGNOSIS — M1612 Unilateral primary osteoarthritis, left hip: Secondary | ICD-10-CM | POA: Diagnosis not present

## 2017-08-20 DIAGNOSIS — E039 Hypothyroidism, unspecified: Secondary | ICD-10-CM | POA: Diagnosis not present

## 2017-12-14 ENCOUNTER — Other Ambulatory Visit: Payer: Self-pay

## 2017-12-14 MED ORDER — SYNTHROID 25 MCG PO TABS: 25.0000 ug | ORAL_TABLET | Freq: Every day | ORAL | 1 refills | Status: DC

## 2018-01-20 ENCOUNTER — Encounter: Payer: Self-pay | Admitting: Internal Medicine

## 2018-01-20 ENCOUNTER — Ambulatory Visit (INDEPENDENT_AMBULATORY_CARE_PROVIDER_SITE_OTHER): Payer: Medicare Other | Admitting: Internal Medicine

## 2018-01-20 ENCOUNTER — Ambulatory Visit: Payer: Medicare Other

## 2018-01-20 VITALS — BP 130/70 | HR 60 | Temp 97.8°F | Ht 60.0 in | Wt 229.8 lb

## 2018-01-20 DIAGNOSIS — R7309 Other abnormal glucose: Secondary | ICD-10-CM

## 2018-01-20 DIAGNOSIS — Z6841 Body Mass Index (BMI) 40.0 and over, adult: Secondary | ICD-10-CM

## 2018-01-20 DIAGNOSIS — M1612 Unilateral primary osteoarthritis, left hip: Secondary | ICD-10-CM

## 2018-01-20 DIAGNOSIS — I10 Essential (primary) hypertension: Secondary | ICD-10-CM

## 2018-01-20 DIAGNOSIS — E039 Hypothyroidism, unspecified: Secondary | ICD-10-CM

## 2018-01-20 DIAGNOSIS — Z0000 Encounter for general adult medical examination without abnormal findings: Secondary | ICD-10-CM

## 2018-01-20 LAB — POCT URINALYSIS DIPSTICK
Bilirubin, UA: NEGATIVE
Blood, UA: NEGATIVE
GLUCOSE UA: NEGATIVE
Ketones, UA: NEGATIVE
LEUKOCYTES UA: NEGATIVE
Nitrite, UA: NEGATIVE
Protein, UA: NEGATIVE
SPEC GRAV UA: 1.02 (ref 1.010–1.025)
Urobilinogen, UA: 0.2 E.U./dL
pH, UA: 5 (ref 5.0–8.0)

## 2018-01-20 LAB — POCT UA - MICROALBUMIN
ALBUMIN/CREATININE RATIO, URINE, POC: 30
Creatinine, POC: 200 mg/dL
Microalbumin Ur, POC: 30 mg/L

## 2018-01-20 NOTE — Addendum Note (Signed)
Addended by: Electa Sniff on: 01/20/2018 12:25 PM   Modules accepted: Orders

## 2018-01-20 NOTE — Progress Notes (Signed)
Subjective:   Rita Yoder is a 77 y.o. female who presents for Medicare Annual (Subsequent) preventive examination.  Review of Systems:  n/a Cardiac Risk Factors include: advanced age (>60men, >12 women);diabetes mellitus;hypertension;obesity (BMI >30kg/m2);sedentary lifestyle     Objective:     Vitals: BP 130/70 (BP Location: Left Arm)   Pulse 60   Temp 97.8 F (36.6 C)   Ht 5' (1.524 m)   Wt 229 lb 12.8 oz (104.2 kg)   BMI 44.88 kg/m   Body mass index is 44.88 kg/m.  Advanced Directives 01/20/2018  Does Patient Have a Medical Advance Directive? Yes  Type of Advance Directive [place] in Chart? No - copy requested    Tobacco Social History   Tobacco Use  Smoking Status Never Smoker  Smokeless Tobacco Never Used     Counseling given: Not Answered   Clinical Intake:  Pre-visit preparation completed: Yes  Pain : 0-10 Pain Score: 7  Pain Type: Chronic pain Pain Location: Hip Pain Orientation: Left Pain Radiating Towards: none Pain Descriptors / Indicators: Other (Comment)(aggrevaiting) Pain Onset: More than a month ago Pain Frequency: Intermittent Pain Relieving Factors: APAP Effect of Pain on Daily Activities: slows down  Pain Relieving Factors: APAP  Nutritional Status: BMI > 30  Obese Nutritional Risks: None Diabetes: Yes CBG done?: No Did pt. bring in CBG monitor from home?: No  How often do you need to have someone help you when you read instructions, pamphlets, or other written materials from your doctor or pharmacy?: 1 - Never What is the last grade level you completed in school?: 11th grade  Interpreter Needed?: No  Information entered by :: NAllen LPN  Past Medical History:  Diagnosis Date  . Chronic kidney disease 12/2009  . Diabetes mellitus 2010  . Dyslipidemia   . Hypertension   . Monoclonal gammopathy 03/2010   Past Surgical History:  Procedure Laterality Date    . CHOLECYSTECTOMY    . HERNIA REPAIR    . ROTATOR CUFF REPAIR  1994  . THYROID SURGERY    . TONSILLECTOMY    . TOTAL ABDOMINAL HYSTERECTOMY     Family History  Problem Relation Age of Onset  . Heart attack Brother   . Cancer Maternal Aunt   . Cancer Maternal Uncle    Social History   Socioeconomic History  . Marital status: Married    Spouse name: Not on file  . Number of children: Not on file  . Years of education: Not on file  . Highest education level: Not on file  Occupational History  . Not on file  Social Needs  . Financial resource strain: Not hard at all  . Food insecurity:    Worry: Never true    Inability: Never true  . Transportation needs:    Medical: No    Non-medical: No  Tobacco Use  . Smoking status: Never Smoker  . Smokeless tobacco: Never Used  Substance and Sexual Activity  . Alcohol use: No  . Drug use: Never  . Sexual activity: Not Currently  Lifestyle  . Physical activity:    Days per week: 0 days    Minutes per session: 0 min  . Stress: Not at all  Relationships  . Social connections:    Talks on phone: Not on file    Gets together: Not on file    Attends religious service: Not on file    Active member of  club or organization: Not on file    Attends meetings of clubs or organizations: Not on file    Relationship status: Not on file  Other Topics Concern  . Not on file  Social History Narrative  . Not on file    Outpatient Encounter Medications as of 01/20/2018  Medication Sig  . aspirin 81 MG tablet Take 81 mg by mouth daily.    Marland Kitchen atorvastatin (LIPITOR) 20 MG tablet   . Blood Glucose Calibration (OT ULTRA/FASTTK CNTRL SOLN) SOLN   . cholecalciferol (VITAMIN D) 400 UNITS TABS Take 2,000 Units by mouth daily.    . hydrochlorothiazide (MICROZIDE) 12.5 MG capsule   . losartan (COZAAR) 100 MG tablet Take 100 mg by mouth daily.  . nitroGLYCERIN (NITROSTAT) 0.4 MG SL tablet Place 0.4 mg under the tongue every 5 (five) minutes as  needed.    Marland Kitchen SYNTHROID 25 MCG tablet Take 1 tablet (25 mcg total) by mouth daily before breakfast.  . VESICARE 5 MG tablet   . Mirabegron (MYRBETRIQ PO) Take 25 mg by mouth daily.   No facility-administered encounter medications on file as of 01/20/2018.     Activities of Daily Living In your present state of health, do you have any difficulty performing the following activities: 01/20/2018  Hearing? Y  Comment left ear some times  Vision? N  Difficulty concentrating or making decisions? N  Walking or climbing stairs? Y  Comment hip pain  Dressing or bathing? N  Doing errands, shopping? N  Preparing Food and eating ? N  Using the Toilet? N  In the past six months, have you accidently leaked urine? N  Do you have problems with loss of bowel control? N  Managing your Medications? N  Managing your Finances? N  Housekeeping or managing your Housekeeping? N  Some recent data might be hidden    Patient Care Team: Glendale Chard, MD as PCP - General (Internal Medicine) [place] [place] (Pediatric Ophthalmology) Elsie Saas, MD as Consulting Physician (Orthopedic Surgery)    Assessment:   This is a routine wellness examination for Rita Yoder.  Exercise Activities and Dietary recommendations Current Exercise Habits: The patient does not participate in regular exercise at present, Exercise limited by: orthopedic condition(s)(using rolling walker)  Goals    . Weight (lb) < 200 lb (90.7 kg) (pt-stated)     Needs to lose 20 pounds to have hip surgery       Fall Risk Fall Risk  01/20/2018  Falls in the past year? 1  Number falls in past yr: 1  Comment just fell  Injury with Fall? 0  Risk for fall due to : History of fall(s);Medication side effect;Impaired balance/gait;Impaired mobility   Is the patient's home free of loose throw rugs in walkways, pet beds, electrical cords, etc?   yes      Grab bars in the bathroom? yes      Handrails on the stairs?   n/a      Adequate  lighting?   yes  Timed Get Up and Go performed: n/a  Depression Screen PHQ 2/9 Scores 01/20/2018  PHQ - 2 Score 0     Cognitive Function     6CIT Screen 01/20/2018  What Year? 0 points  What month? 0 points  What time? 0 points  Count back from 20 0 points  Months in reverse 0 points  Repeat phrase 2 points  Total Score 2     There is no immunization history on file for this  patient.  Qualifies for Shingles Vaccine? yes  Screening Tests Health Maintenance  Topic Date Due  . HEMOGLOBIN A1C  Jan 08, 1940  . FOOT EXAM  01/16/1950  . OPHTHALMOLOGY EXAM  01/16/1950  . INFLUENZA VACCINE  01/21/2019 (Originally 10/08/2017)  . PNA vac Low Risk Adult (1 of 2 - PCV13) 01/21/2019 (Originally 01/16/2005)  . TETANUS/TDAP  11/11/2022  . DEXA SCAN  Completed    Cancer Screenings: Lung: Low Dose CT Chest recommended if Age 69-80 years, 30 pack-year currently smoking OR have quit w/in 15years. Patient does not qualify. Breast:  Up to date on Mammogram? Yes   Up to date of Bone Density/Dexa? Yes Colorectal: not required  Additional Screenings: : Hepatitis C Screening:      Plan:    Patient wants to lose 20 pounds in order to have hip surgery.   I have personally reviewed and noted the following in the patient's chart:   . Medical and social history . Use of alcohol, tobacco or illicit drugs  . Current medications and supplements . Functional ability and status . Nutritional status . Physical activity . Advanced directives . List of other physicians . Hospitalizations, surgeries, and ER visits in previous 12 months . Vitals . Screenings to include cognitive, depression, and falls . Referrals and appointments  In addition, I have reviewed and discussed with patient certain preventive protocols, quality metrics, and best practice recommendations. A written personalized care plan for preventive services as well as general preventive health recommendations were provided to  patient.     Kellie Simmering, LPN  [id number]/[id number]

## 2018-01-20 NOTE — Progress Notes (Signed)
Subjective:     Patient ID: Rita Yoder , female    DOB: 04/30/1940 , 77 y.o.   MRN: [id number]   CC- here for HTN FU  HPI  Here for HTN and hyperglycemia FU, on average glucose is 130 fasting.  Denies hypoglycemic episodes. Has not been on medication for years and her A1c the last time in June was 5.1. She is supposed to loose 20 lbs in order to have L hip replacement.   Past Medical History:  Diagnosis Date  . Chronic kidney disease 12/2009  . Diabetes mellitus 2010  . Dyslipidemia   . Hypertension   . Monoclonal gammopathy 03/2010     Family History  Problem Relation Age of Onset  . Heart attack Brother   . Cancer Maternal Aunt   . Cancer Maternal Uncle      Current Outpatient Medications:  .  aspirin 81 MG tablet, Take 81 mg by mouth daily.  , Disp: , Rfl:  .  atorvastatin (LIPITOR) 20 MG tablet, , Disp: , Rfl:  .  Blood Glucose Calibration (OT ULTRA/FASTTK CNTRL SOLN) SOLN, , Disp: , Rfl:  .  cholecalciferol (VITAMIN D) 400 UNITS TABS, Take 2,000 Units by mouth daily.  , Disp: , Rfl:  .  hydrochlorothiazide (MICROZIDE) 12.5 MG capsule, , Disp: , Rfl:  .  losartan (COZAAR) 100 MG tablet, Take 100 mg by mouth daily., Disp: , Rfl:  .  Mirabegron (MYRBETRIQ PO), Take 25 mg by mouth daily., Disp: , Rfl:  .  nitroGLYCERIN (NITROSTAT) 0.4 MG SL tablet, Place 0.4 mg under the tongue every 5 (five) minutes as needed.  , Disp: , Rfl:  .  SYNTHROID 25 MCG tablet, Take 1 tablet (25 mcg total) by mouth daily before breakfast., Disp: 90 tablet, Rfl: 1 .  VESICARE 5 MG tablet, , Disp: , Rfl:    No Known Allergies   Review of Systems  Constitutional: Negative for chills, diaphoresis, fever and unexpected weight change.       She has not been intentional to loose wt, has not changed her diet.   HENT: Negative for tinnitus.   Eyes: Negative for visual disturbance.  Respiratory: Negative for shortness of breath.   Cardiovascular: Negative for chest pain, palpitations and leg  swelling.  Endocrine: Negative for polydipsia and polyphagia.  Genitourinary: Positive for frequency. Negative for dysuria.       + incontinence which is not any worse  Musculoskeletal: Positive for arthralgias and gait problem. Negative for neck pain and neck stiffness.       Chronic L hip pain and needs to loose 20 lbs before she can have the surgery. Uses a walker to ambulate due to L hip pain  Skin: Negative for rash.  Neurological: Negative for speech difficulty, weakness and headaches.  Psychiatric/Behavioral: The patient is not nervous/anxious.      Today's Vitals   01/20/18 1127  BP: 130/70  Pulse: 60  Temp: 97.8 F (36.6 C)  TempSrc: Oral  Weight: 229 lb 12.8 oz (104.2 kg)  Height: 5' (1.524 m)   Body mass index is 44.88 kg/m.   Objective:  Physical Exam   Constitutional: She is oriented to person, place, and time. She appears well-developed and well-nourished. No distress. Uses a walker to ambulate HENT:  Head: Normocephalic and atraumatic.  Right Ear: External ear normal.  Left Ear: External ear normal.  Nose: Nose normal.  Eyes: Conjunctivae are normal. Right eye exhibits no discharge. Left eye exhibits no discharge.  No scleral icterus.  Neck: Neck supple. No thyromegaly present. No carotid bruits bilaterally  Cardiovascular: Normal rate and regular rhythm.  No murmur heard. Pulmonary/Chest: Effort normal and breath sounds normal. No respiratory distress.  Musculoskeletal: She exhibits no edema.  Lymphadenopathy:    She has no cervical adenopathy.  Neurological: She is alert and oriented to person, place, and time.  Skin: Skin is warm and dry. Capillary refill takes less than 2 seconds. No rash noted. She is not diaphoretic.  Psychiatric: She has a normal mood and affect. Her behavior is normal. Judgment and thought content normal.  Nursing note reviewed. Assessment And Plan:    1. Hypothyroidism, unspecified type- chronic - TSH - T4, Free - T3,  free  2. Essential hypertension- chronic      Pt seen for Medicare Wellness by LPN today - [id number] + Anion Gap - CBC no Diff  3. Abnormal glucose- with past hx of being a DM, but off meds now - Hemoglobin A1c 4- Obesity- advised to try to cut her portions in half.  She may continue her current medications. FU in 3 months.  5- OA L hip- no action  Taesha Goodell RODRIGUEZ-SOUTH[place], PA-C

## 2018-01-20 NOTE — Patient Instructions (Addendum)
Ms. Boileau , Thank you for taking time to come for your Medicare Wellness Visit. I appreciate your ongoing commitment to your health goals. Please review the following plan we discussed and let me know if I can assist you in the future.   Screening recommendations/referrals: Colonoscopy: not required Mammogram: pt. To make appt. Bone Density: up to date Recommended yearly ophthalmology/optometry visit for glaucoma screening and checkup Recommended yearly dental visit for hygiene and checkup  Vaccinations: Influenza vaccine: declines Pneumococcal vaccine: declines Tdap vaccine: 11/2012 Shingles vaccine: declines    Advanced directives: Please bring a copy of your POA (Power of Attorney) and/or Living Will to your next appointment.    Conditions/risks identified: obesity: States needs to lose 20 pounds in order to have hip surgery  Next appointment: 04/28/2018 at 10:00   Preventive Care 8 Years and Older, Female Preventive care refers to lifestyle choices and visits with your health care provider that can promote health and wellness. What does preventive care include?  A yearly physical exam. This is also called an annual well check.  Dental exams once or twice a year.  Routine eye exams. Ask your health care provider how often you should have your eyes checked.  Personal lifestyle choices, including:  Daily care of your teeth and gums.  Regular physical activity.  Eating a healthy diet.  Avoiding tobacco and drug use.  Limiting alcohol use.  Practicing safe sex.  Taking low-dose aspirin every day.  Taking vitamin and mineral supplements as recommended by your health care provider. What happens during an annual well check? The services and screenings done by your health care provider during your annual well check will depend on your age, overall health, lifestyle risk factors, and family history of disease. Counseling  Your health care provider may ask you  questions about your:  Alcohol use.  Tobacco use.  Drug use.  Emotional well-being.  Home and relationship well-being.  Sexual activity.  Eating habits.  History of falls.  Memory and ability to understand (cognition).  Work and work Statistician.  Reproductive health. Screening  You may have the following tests or measurements:  Height, weight, and BMI.  Blood pressure.  Lipid and cholesterol levels. These may be checked every 5 years, or more frequently if you are over 73 years old.  Skin check.  Lung cancer screening. You may have this screening every year starting at age 76 if you have a 30-pack-year history of smoking and currently smoke or have quit within the past 15 years.  Fecal occult blood test (FOBT) of the stool. You may have this test every year starting at age 65.  Flexible sigmoidoscopy or colonoscopy. You may have a sigmoidoscopy every 5 years or a colonoscopy every 10 years starting at age 37.  Hepatitis C blood test.  Hepatitis B blood test.  Sexually transmitted disease (STD) testing.  Diabetes screening. This is done by checking your blood sugar (glucose) after you have not eaten for a while (fasting). You may have this done every 1-3 years.  Bone density scan. This is done to screen for osteoporosis. You may have this done starting at age 16.  Mammogram. This may be done every 1-2 years. Talk to your health care provider about how often you should have regular mammograms. Talk with your health care provider about your test results, treatment options, and if necessary, the need for more tests. Vaccines  Your health care provider may recommend certain vaccines, such as:  Influenza vaccine. This is  recommended every year.  Tetanus, diphtheria, and acellular pertussis (Tdap, Td) vaccine. You may need a Td booster every 10 years.  Zoster vaccine. You may need this after age 42.  Pneumococcal 13-valent conjugate (PCV13) vaccine. One dose is  recommended after age 42.  Pneumococcal polysaccharide (PPSV23) vaccine. One dose is recommended after age 57. Talk to your health care provider about which screenings and vaccines you need and how often you need them. This information is not intended to replace advice given to you by your health care provider. Make sure you discuss any questions you have with your health care provider. Document Released: 03/23/2015 Document Revised: 11/14/2015 Document Reviewed: 12/26/2014 Elsevier Interactive Patient Education  2017 [place] Prevention in the Home Falls can cause injuries. They can happen to people of all ages. There are many things you can do to make your home safe and to help prevent falls. What can I do on the outside of my home?  Regularly fix the edges of walkways and driveways and fix any cracks.  Remove anything that might make you trip as you walk through a door, such as a raised step or threshold.  Trim any bushes or trees on the path to your home.  Use bright outdoor lighting.  Clear any walking paths of anything that might make someone trip, such as rocks or tools.  Regularly check to see if handrails are loose or broken. Make sure that both sides of any steps have handrails.  Any raised decks and porches should have guardrails on the edges.  Have any leaves, snow, or ice cleared regularly.  Use sand or salt on walking paths during winter.  Clean up any spills in your garage right away. This includes oil or grease spills. What can I do in the bathroom?  Use night lights.  Install grab bars by the toilet and in the tub and shower. Do not use towel bars as grab bars.  Use non-skid mats or decals in the tub or shower.  If you need to sit down in the shower, use a plastic, non-slip stool.  Keep the floor dry. Clean up any water that spills on the floor as soon as it happens.  Remove soap buildup in the tub or shower regularly.  Attach bath mats  securely with double-sided non-slip rug tape.  Do not have throw rugs and other things on the floor that can make you trip. What can I do in the bedroom?  Use night lights.  Make sure that you have a light by your bed that is easy to reach.  Do not use any sheets or blankets that are too big for your bed. They should not hang down onto the floor.  Have a firm chair that has side arms. You can use this for support while you get dressed.  Do not have throw rugs and other things on the floor that can make you trip. What can I do in the kitchen?  Clean up any spills right away.  Avoid walking on wet floors.  Keep items that you use a lot in easy-to-reach places.  If you need to reach something above you, use a strong step stool that has a grab bar.  Keep electrical cords out of the way.  Do not use floor polish or wax that makes floors slippery. If you must use wax, use non-skid floor wax.  Do not have throw rugs and other things on the floor that can make you trip.  What can I do with my stairs?  Do not leave any items on the stairs.  Make sure that there are handrails on both sides of the stairs and use them. Fix handrails that are broken or loose. Make sure that handrails are as long as the stairways.  Check any carpeting to make sure that it is firmly attached to the stairs. Fix any carpet that is loose or worn.  Avoid having throw rugs at the top or bottom of the stairs. If you do have throw rugs, attach them to the floor with carpet tape.  Make sure that you have a light switch at the top of the stairs and the bottom of the stairs. If you do not have them, ask someone to add them for you. What else can I do to help prevent falls?  Wear shoes that:  Do not have high heels.  Have rubber bottoms.  Are comfortable and fit you well.  Are closed at the toe. Do not wear sandals.  If you use a stepladder:  Make sure that it is fully opened. Do not climb a closed  stepladder.  Make sure that both sides of the stepladder are locked into place.  Ask someone to hold it for you, if possible.  Clearly mark and make sure that you can see:  Any grab bars or handrails.  First and last steps.  Where the edge of each step is.  Use tools that help you move around (mobility aids) if they are needed. These include:  Canes.  Walkers.  Scooters.  Crutches.  Turn on the lights when you go into a dark area. Replace any light bulbs as soon as they burn out.  Set up your furniture so you have a clear path. Avoid moving your furniture around.  If any of your floors are uneven, fix them.  If there are any pets around you, be aware of where they are.  Review your medicines with your doctor. Some medicines can make you feel dizzy. This can increase your chance of falling. Ask your doctor what other things that you can do to help prevent falls. This information is not intended to replace advice given to you by your health care provider. Make sure you discuss any questions you have with your health care provider. Document Released: 12/21/2008 Document Revised: 08/02/2015 Document Reviewed: 03/31/2014 Elsevier Interactive Patient Education  2017 [employer].

## 2018-01-20 NOTE — Patient Instructions (Signed)

## 2018-01-26 LAB — CMP14 + ANION GAP

## 2018-01-26 LAB — CBC
HEMATOCRIT: 34.3 % (ref 34.0–46.6)
Hemoglobin: 11.4 g/dL (ref 11.1–15.9)
MCH: 32.7 pg (ref 26.6–33.0)
MCHC: 33.2 g/dL (ref 31.5–35.7)
MCV: 98 fL — AB (ref 79–97)
Platelets: 193 10*3/uL (ref 150–450)
RBC: 3.49 x10E6/uL — ABNORMAL LOW (ref 3.77–5.28)
RDW: 11 % — ABNORMAL LOW (ref 12.3–15.4)
WBC: 9.2 10*3/uL (ref 3.4–10.8)

## 2018-01-26 LAB — HEMOGLOBIN A1C
ESTIMATED AVERAGE GLUCOSE: 94 mg/dL
HEMOGLOBIN A1C: 4.9 % (ref 4.8–5.6)

## 2018-01-26 LAB — T3, FREE: T3 FREE: 3 pg/mL (ref 2.0–4.4)

## 2018-01-26 LAB — T4, FREE: FREE T4: 1.13 ng/dL (ref 0.82–1.77)

## 2018-01-26 LAB — TSH: TSH: 3.13 u[IU]/mL (ref 0.450–4.500)

## 2018-02-09 ENCOUNTER — Other Ambulatory Visit: Payer: Self-pay | Admitting: Internal Medicine

## 2018-02-18 ENCOUNTER — Other Ambulatory Visit: Payer: Self-pay | Admitting: Internal Medicine

## 2018-02-19 ENCOUNTER — Other Ambulatory Visit: Payer: Self-pay | Admitting: Internal Medicine

## 2018-03-12 ENCOUNTER — Other Ambulatory Visit: Payer: Self-pay | Admitting: Internal Medicine

## 2018-04-01 ENCOUNTER — Other Ambulatory Visit: Payer: Self-pay

## 2018-04-01 MED ORDER — LEVOTHYROXINE SODIUM 25 MCG PO TABS: 25.0000 ug | ORAL_TABLET | Freq: Every day | ORAL | 1 refills | Status: DC

## 2018-04-28 ENCOUNTER — Ambulatory Visit: Payer: Medicare Other | Admitting: Internal Medicine

## 2018-05-14 ENCOUNTER — Other Ambulatory Visit: Payer: Self-pay | Admitting: Internal Medicine

## 2018-08-12 ENCOUNTER — Other Ambulatory Visit: Payer: Self-pay | Admitting: Internal Medicine

## 2018-08-19 ENCOUNTER — Other Ambulatory Visit: Payer: Self-pay | Admitting: Internal Medicine

## 2018-08-25 ENCOUNTER — Telehealth: Payer: Self-pay

## 2018-08-25 NOTE — Telephone Encounter (Signed)
The pt was notified that her SCAT application has been faxed and a copy has been left up front for pickup.

## 2018-10-22 ENCOUNTER — Other Ambulatory Visit: Payer: Self-pay | Admitting: Internal Medicine

## 2018-10-28 ENCOUNTER — Telehealth: Payer: Self-pay

## 2018-10-28 NOTE — Telephone Encounter (Signed)
Left a message for the pt to call the office back.  I called the pt to let her know that her handicap placard is ready for pickup.

## 2018-11-02 ENCOUNTER — Telehealth: Payer: Self-pay | Admitting: Internal Medicine

## 2018-11-02 NOTE — Telephone Encounter (Signed)
I called the patient about telephone AWV on 9/9.  She doesn't have access for interent/video, so she agreed to come into the office. VDM (DD)

## 2018-11-05 ENCOUNTER — Other Ambulatory Visit: Payer: Self-pay | Admitting: Internal Medicine

## 2018-11-12 ENCOUNTER — Ambulatory Visit: Payer: Self-pay | Admitting: Cardiology

## 2018-11-18 ENCOUNTER — Other Ambulatory Visit: Payer: Self-pay

## 2018-11-18 ENCOUNTER — Ambulatory Visit (INDEPENDENT_AMBULATORY_CARE_PROVIDER_SITE_OTHER): Payer: Medicare Other

## 2018-11-18 VITALS — BP 116/82 | HR 59 | Temp 98.2°F | Ht 59.4 in | Wt 220.8 lb

## 2018-11-18 DIAGNOSIS — Z0000 Encounter for general adult medical examination without abnormal findings: Secondary | ICD-10-CM

## 2018-11-18 NOTE — Patient Instructions (Signed)
Rita Yoder , Thank you for taking time to come for your Medicare Wellness Visit. I appreciate your ongoing commitment to your health goals. Please review the following plan we discussed and let me know if I can assist you in the future.   Screening recommendations/referrals: Colonoscopy: not required Mammogram: 02/2018 Bone Density: 02/2017 Recommended yearly ophthalmology/optometry visit for glaucoma screening and checkup Recommended yearly dental visit for hygiene and checkup  Vaccinations: Influenza vaccine: declines Pneumococcal vaccine: declines Tdap vaccine: 11/2012 Shingles vaccine: discused    Advanced directives: Advance directive discussed with you today. Even though you declined this today please call our office should you change your mind and we can give you the proper paperwork for you to fill out.   Conditions/risks identified: obesity  Next appointment: 01/26/2019 at 9:45   Preventive Care 65 Years and Older, Female Preventive care refers to lifestyle choices and visits with your health care provider that can promote health and wellness. What does preventive care include?  A yearly physical exam. This is also called an annual well check.  Dental exams once or twice a year.  Routine eye exams. Ask your health care provider how often you should have your eyes checked.  Personal lifestyle choices, including:  Daily care of your teeth and gums.  Regular physical activity.  Eating a healthy diet.  Avoiding tobacco and drug use.  Limiting alcohol use.  Practicing safe sex.  Taking low-dose aspirin every day.  Taking vitamin and mineral supplements as recommended by your health care provider. What happens during an annual well check? The services and screenings done by your health care provider during your annual well check will depend on your age, overall health, lifestyle risk factors, and family history of disease. Counseling  Your health care  provider may ask you questions about your:  Alcohol use.  Tobacco use.  Drug use.  Emotional well-being.  Home and relationship well-being.  Sexual activity.  Eating habits.  History of falls.  Memory and ability to understand (cognition).  Work and work Statistician.  Reproductive health. Screening  You may have the following tests or measurements:  Height, weight, and BMI.  Blood pressure.  Lipid and cholesterol levels. These may be checked every 5 years, or more frequently if you are over 35 years old.  Skin check.  Lung cancer screening. You may have this screening every year starting at age 79 if you have a 30-pack-year history of smoking and currently smoke or have quit within the past 15 years.  Fecal occult blood test (FOBT) of the stool. You may have this test every year starting at age 79.  Flexible sigmoidoscopy or colonoscopy. You may have a sigmoidoscopy every 5 years or a colonoscopy every 10 years starting at age 75.  Hepatitis C blood test.  Hepatitis B blood test.  Sexually transmitted disease (STD) testing.  Diabetes screening. This is done by checking your blood sugar (glucose) after you have not eaten for a while (fasting). You may have this done every 1-3 years.  Bone density scan. This is done to screen for osteoporosis. You may have this done starting at age 79.  Mammogram. This may be done every 1-2 years. Talk to your health care provider about how often you should have regular mammograms. Talk with your health care provider about your test results, treatment options, and if necessary, the need for more tests. Vaccines  Your health care provider may recommend certain vaccines, such as:  Influenza vaccine. This is recommended  every year.  Tetanus, diphtheria, and acellular pertussis (Tdap, Td) vaccine. You may need a Td booster every 10 years.  Zoster vaccine. You may need this after age 79.  Pneumococcal 13-valent conjugate (PCV13)  vaccine. One dose is recommended after age 79.  Pneumococcal polysaccharide (PPSV23) vaccine. One dose is recommended after age 55. Talk to your health care provider about which screenings and vaccines you need and how often you need them. This information is not intended to replace advice given to you by your health care provider. Make sure you discuss any questions you have with your health care provider. Document Released: 03/23/2015 Document Revised: 11/14/2015 Document Reviewed: 12/26/2014 Elsevier Interactive Patient Education  2017 [employer] Prevention in the Home Falls can cause injuries. They can happen to people of all ages. There are many things you can do to make your home safe and to help prevent falls. What can I do on the outside of my home?  Regularly fix the edges of walkways and driveways and fix any cracks.  Remove anything that might make you trip as you walk through a door, such as a raised step or threshold.  Trim any bushes or trees on the path to your home.  Use bright outdoor lighting.  Clear any walking paths of anything that might make someone trip, such as rocks or tools.  Regularly check to see if handrails are loose or broken. Make sure that both sides of any steps have handrails.  Any raised decks and porches should have guardrails on the edges.  Have any leaves, snow, or ice cleared regularly.  Use sand or salt on walking paths during winter.  Clean up any spills in your garage right away. This includes oil or grease spills. What can I do in the bathroom?  Use night lights.  Install grab bars by the toilet and in the tub and shower. Do not use towel bars as grab bars.  Use non-skid mats or decals in the tub or shower.  If you need to sit down in the shower, use a plastic, non-slip stool.  Keep the floor dry. Clean up any water that spills on the floor as soon as it happens.  Remove soap buildup in the tub or shower regularly.   Attach bath mats securely with double-sided non-slip rug tape.  Do not have throw rugs and other things on the floor that can make you trip. What can I do in the bedroom?  Use night lights.  Make sure that you have a light by your bed that is easy to reach.  Do not use any sheets or blankets that are too big for your bed. They should not hang down onto the floor.  Have a firm chair that has side arms. You can use this for support while you get dressed.  Do not have throw rugs and other things on the floor that can make you trip. What can I do in the kitchen?  Clean up any spills right away.  Avoid walking on wet floors.  Keep items that you use a lot in easy-to-reach places.  If you need to reach something above you, use a strong step stool that has a grab bar.  Keep electrical cords out of the way.  Do not use floor polish or wax that makes floors slippery. If you must use wax, use non-skid floor wax.  Do not have throw rugs and other things on the floor that can make you trip. What  can I do with my stairs?  Do not leave any items on the stairs.  Make sure that there are handrails on both sides of the stairs and use them. Fix handrails that are broken or loose. Make sure that handrails are as long as the stairways.  Check any carpeting to make sure that it is firmly attached to the stairs. Fix any carpet that is loose or worn.  Avoid having throw rugs at the top or bottom of the stairs. If you do have throw rugs, attach them to the floor with carpet tape.  Make sure that you have a light switch at the top of the stairs and the bottom of the stairs. If you do not have them, ask someone to add them for you. What else can I do to help prevent falls?  Wear shoes that:  Do not have high heels.  Have rubber bottoms.  Are comfortable and fit you well.  Are closed at the toe. Do not wear sandals.  If you use a stepladder:  Make sure that it is fully opened. Do not climb  a closed stepladder.  Make sure that both sides of the stepladder are locked into place.  Ask someone to hold it for you, if possible.  Clearly mark and make sure that you can see:  Any grab bars or handrails.  First and last steps.  Where the edge of each step is.  Use tools that help you move around (mobility aids) if they are needed. These include:  Canes.  Walkers.  Scooters.  Crutches.  Turn on the lights when you go into a dark area. Replace any light bulbs as soon as they burn out.  Set up your furniture so you have a clear path. Avoid moving your furniture around.  If any of your floors are uneven, fix them.  If there are any pets around you, be aware of where they are.  Review your medicines with your doctor. Some medicines can make you feel dizzy. This can increase your chance of falling. Ask your doctor what other things that you can do to help prevent falls. This information is not intended to replace advice given to you by your health care provider. Make sure you discuss any questions you have with your health care provider. Document Released: 12/21/2008 Document Revised: 08/02/2015 Document Reviewed: 03/31/2014 Elsevier Interactive Patient Education  2017 [employer].

## 2018-11-18 NOTE — Progress Notes (Signed)
Subjective:   Rita Yoder is a 78 y.o. female who presents for Medicare Annual (Subsequent) preventive examination.  Review of Systems:  n/a Cardiac Risk Factors include: dyslipidemia;hypertension;obesity (BMI >30kg/m2);sedentary lifestyle;advanced age (>9men, >2 women)     Objective:     Vitals: BP 116/82   Pulse (!) 59   Temp 98.2 F (36.8 C) (Oral)   Ht 4' 11.4" (1.509 m)   Wt 220 lb 12.8 oz (100.2 kg)   SpO2 99%   BMI 44.00 kg/m   Body mass index is 44 kg/m.  Advanced Directives 11/18/2018 01/20/2018  Does Patient Have a Medical Advance Directive? No Yes  Type of Advance Directive - [place] in Chart? - No - copy requested  Would patient like information on creating a medical advance directive? No - Patient declined -    Tobacco Social History   Tobacco Use  Smoking Status Never Smoker  Smokeless Tobacco Never Used     Counseling given: Not Answered   Clinical Intake:  Pre-visit preparation completed: Yes  Pain : 0-10 Pain Score: 7  Pain Type: Chronic pain Pain Location: Hip Pain Orientation: Left Pain Descriptors / Indicators: Aching Pain Onset: More than a month ago Pain Frequency: Intermittent Pain Relieving Factors: tylenol  Pain Relieving Factors: tylenol  Nutritional Status: BMI > 30  Obese Nutritional Risks: None Diabetes: No  How often do you need to have someone help you when you read instructions, pamphlets, or other written materials from your doctor or pharmacy?: 1 - Never What is the last grade level you completed in school?: 11th grade  Interpreter Needed?: No  Information entered by :: CJohnson, LPN  Past Medical History:  Diagnosis Date  . Chronic kidney disease 12/2009  . Diabetes mellitus 2010  . Dyslipidemia   . Hypertension   . Monoclonal gammopathy 03/2010   Past Surgical History:  Procedure Laterality Date  . CHOLECYSTECTOMY    . HERNIA REPAIR     . ROTATOR CUFF REPAIR  1994  . THYROID SURGERY    . TONSILLECTOMY    . TOTAL ABDOMINAL HYSTERECTOMY     Family History  Problem Relation Age of Onset  . Heart attack Brother   . Cancer Maternal Aunt   . Cancer Maternal Uncle    Social History   Socioeconomic History  . Marital status: Married    Spouse name: Not on file  . Number of children: Not on file  . Years of education: Not on file  . Highest education level: Not on file  Occupational History  . Not on file  Social Needs  . Financial resource strain: Not hard at all  . Food insecurity    Worry: Never true    Inability: Never true  . Transportation needs    Medical: No    Non-medical: No  Tobacco Use  . Smoking status: Never Smoker  . Smokeless tobacco: Never Used  Substance and Sexual Activity  . Alcohol use: No  . Drug use: Never  . Sexual activity: Not Currently  Lifestyle  . Physical activity    Days per week: 0 days    Minutes per session: 0 min  . Stress: Not at all  Relationships  . Social Herbalist on phone: Not on file    Gets together: Not on file    Attends religious service: Not on file    Active member of club or organization: Not  on file    Attends meetings of clubs or organizations: Not on file    Relationship status: Not on file  Other Topics Concern  . Not on file  Social History Narrative  . Not on file    Outpatient Encounter Medications as of 11/18/2018  Medication Sig  . aspirin 81 MG tablet Take 81 mg by mouth daily.    Marland Kitchen atorvastatin (LIPITOR) 20 MG tablet TAKE 1 TABLET BY MOUTH EVERY DAY  . Blood Glucose Calibration (OT ULTRA/FASTTK CNTRL SOLN) SOLN   . cholecalciferol (VITAMIN D) 400 UNITS TABS Take 2,000 Units by mouth daily.    . hydrochlorothiazide (MICROZIDE) 12.5 MG capsule TAKE 1 CAPSULE BY MOUTH EVERY DAY  . levothyroxine (SYNTHROID) 25 MCG tablet TAKE 1 TABLET BY MOUTH  DAILY BEFORE BREAKFAST  . losartan (COZAAR) 100 MG tablet TAKE 1 TABLET BY MOUTH   EVERY DAY  . Mirabegron (MYRBETRIQ PO) Take 25 mg by mouth daily.  . nitroGLYCERIN (NITROSTAT) 0.4 MG SL tablet DISSOLVE 1 TABLET UNDER THE TONGUE AT FIRST SIGN OF ATTACKS, MAY REPEAT EVERY 5 MINUTES UP TO 3 TABLETS, IF NO RELLIEF SEEK MEDICAL HELP  . omega-3 acid ethyl esters (LOVAZA) 1 g capsule TAKE 2 CAPSULES BY MOUTH TWICE DAILY  . VESICARE 5 MG tablet    No facility-administered encounter medications on file as of 11/18/2018.     Activities of Daily Living In your present state of health, do you have any difficulty performing the following activities: 11/18/2018 01/20/2018  Hearing? Y Y  Comment left ear left ear some times  Vision? N N  Difficulty concentrating or making decisions? N N  Walking or climbing stairs? Y Y  Comment left hip pain, walks with walker and cane hip pain  Dressing or bathing? N N  Doing errands, shopping? N N  Preparing Food and eating ? N N  Using the Toilet? N N  In the past six months, have you accidently leaked urine? N N  Do you have problems with loss of bowel control? N N  Managing your Medications? N N  Managing your Finances? N N  Housekeeping or managing your Housekeeping? N N  Some recent data might be hidden    Patient Care Team: Glendale Chard, MD as PCP - General (Internal Medicine) [place] [place] (Pediatric Ophthalmology) Elsie Saas, MD as Consulting Physician (Orthopedic Surgery)    Assessment:   This is a routine wellness examination for Rita Yoder.  Exercise Activities and Dietary recommendations Current Exercise Habits: The patient does not participate in regular exercise at present, Exercise limited by: orthopedic condition(s)  Goals    . Weight (lb) < 200 lb (90.7 kg) (pt-stated)     Needs to lose 20 pounds to have hip surgery    . Weight (lb) < 200 lb (90.7 kg)     Wants to lose about 20 lbs       Fall Risk Fall Risk  11/18/2018 01/20/2018  Falls in the past year? 1 1  Comment 3 falls -  Number falls in past yr:  1 1  Comment - just fell  Injury with Fall? 0 0  Risk for fall due to : History of fall(s);Impaired balance/gait;Medication side effect History of fall(s);Medication side effect;Impaired balance/gait;Impaired mobility  Follow up Falls evaluation completed;Falls prevention discussed -   Is the patient's home free of loose throw rugs in walkways, pet beds, electrical cords, etc?   no      Grab bars in the bathroom? yes  Handrails on the stairs?   n/a      Adequate lighting?   Yes  Timed Get Up and Go performed: n/a  Depression Screen PHQ 2/9 Scores 11/18/2018 01/20/2018  PHQ - 2 Score 0 0  PHQ- 9 Score 0 -     Cognitive Function     6CIT Screen 11/18/2018 01/20/2018  What Year? 0 points 0 points  What month? 0 points 0 points  What time? 0 points 0 points  Count back from 20 0 points 0 points  Months in reverse 0 points 0 points  Repeat phrase 0 points 2 points  Total Score 0 2     There is no immunization history on file for this patient.  Qualifies for Shingles Vaccine?Yes  Screening Tests Health Maintenance  Topic Date Due  . FOOT EXAM  01/16/1951  . OPHTHALMOLOGY EXAM  01/16/1951  . HEMOGLOBIN A1C  07/21/2018  . INFLUENZA VACCINE  10/09/2018  . PNA vac Low Risk Adult (1 of 2 - PCV13) 01/21/2019 (Originally 01/16/2005)  . TETANUS/TDAP  11/11/2022  . DEXA SCAN  Completed    Cancer Screenings: Lung: Low Dose CT Chest recommended if Age 79-80 years, 30 pack-year currently smoking OR have quit w/in 15years. Patient does not qualify. Breast:  Up to date on Mammogram? Yes   Up to date of Bone Density/Dexa? Yes Colorectal: no longer required  Additional Screenings: : Hepatitis C Screening: n/a     Plan:    Patient wants to lose 20 lbs    I have personally reviewed and noted the following in the patient's chart:   . Medical and social history . Use of alcohol, tobacco or illicit drugs  . Current medications and supplements . Functional ability and status  . Nutritional status . Physical activity . Advanced directives . List of other physicians . Hospitalizations, surgeries, and ER visits in previous 12 months . Vitals . Screenings to include cognitive, depression, and falls . Referrals and appointments  In addition, I have reviewed and discussed with patient certain preventive protocols, quality metrics, and best practice recommendations. A written personalized care plan for preventive services as well as general preventive health recommendations were provided to patient.     Kellie Simmering, LPN  [id number]

## 2018-11-19 NOTE — Progress Notes (Addendum)
Patient referred by Glendale Chard, MD for chest pain  Subjective:   Rita Yoder, female    DOB: 1940-12-16, 78 y.o.   MRN: [id number]   Chief Complaint  Patient presents with  . Hypertension  . Chest Pain  . New Patient (Initial Visit)     HPI  78 y.o. African American female with hypertension, hyperlipidemia, type 2 DM, mild nonobstructive coronary artery disease (cath 2006), monoclonal gammopathy, morbid obesity, referred for evaluation of chest pain.  Patient has had episodes of retrosternal chest pain, dull, lasting for couple minutes.  Episodes have occurred both on exertion-such as walking, improved with sublingual nitroglycerin and rest; as well as while at rest at night after eating.  She denies any significant shortness of breath or any other associated symptoms.  She is compliant with medical therapy with well-controlled blood pressure and diabetes.  She usually walks with a walker.   Past Medical History:  Diagnosis Date  . Chronic kidney disease 12/2009  . Diabetes mellitus 2010  . Dyslipidemia   . Hypertension   . Monoclonal gammopathy 03/2010     Past Surgical History:  Procedure Laterality Date  . CHOLECYSTECTOMY    . HERNIA REPAIR    . ROTATOR CUFF REPAIR  1994  . THYROID SURGERY    . TONSILLECTOMY    . TOTAL ABDOMINAL HYSTERECTOMY       Social History   Socioeconomic History  . Marital status: Married    Spouse name: Not on file  . Number of children: Not on file  . Years of education: Not on file  . Highest education level: Not on file  Occupational History  . Not on file  Social Needs  . Financial resource strain: Not hard at all  . Food insecurity    Worry: Never true    Inability: Never true  . Transportation needs    Medical: No    Non-medical: No  Tobacco Use  . Smoking status: Never Smoker  . Smokeless tobacco: Never Used  Substance and Sexual Activity  . Alcohol use: No  . Drug use: Never  . Sexual activity: Not  Currently  Lifestyle  . Physical activity    Days per week: 0 days    Minutes per session: 0 min  . Stress: Not at all  Relationships  . Social Herbalist on phone: Not on file    Gets together: Not on file    Attends religious service: Not on file    Active member of club or organization: Not on file    Attends meetings of clubs or organizations: Not on file    Relationship status: Not on file  . Intimate partner violence    Fear of current or ex partner: No    Emotionally abused: No    Physically abused: No    Forced sexual activity: No  Other Topics Concern  . Not on file  Social History Narrative  . Not on file     Family History  Problem Relation Age of Onset  . Heart attack Brother   . Cancer Maternal Aunt   . Cancer Maternal Uncle      Current Outpatient Medications on File Prior to Visit  Medication Sig Dispense Refill  . aspirin 81 MG tablet Take 81 mg by mouth daily.      Marland Kitchen atorvastatin (LIPITOR) 20 MG tablet TAKE 1 TABLET BY MOUTH EVERY DAY 90 tablet 0  . Blood Glucose Calibration (OT  ULTRA/FASTTK CNTRL SOLN) SOLN     . cholecalciferol (VITAMIN D) 400 UNITS TABS Take 2,000 Units by mouth daily.      . hydrochlorothiazide (MICROZIDE) 12.5 MG capsule TAKE 1 CAPSULE BY MOUTH EVERY DAY 90 capsule 0  . levothyroxine (SYNTHROID) 25 MCG tablet TAKE 1 TABLET BY MOUTH  DAILY BEFORE BREAKFAST 90 tablet 1  . losartan (COZAAR) 100 MG tablet TAKE 1 TABLET BY MOUTH  EVERY DAY 90 tablet 3  . Mirabegron (MYRBETRIQ PO) Take 25 mg by mouth daily.    . nitroGLYCERIN (NITROSTAT) 0.4 MG SL tablet DISSOLVE 1 TABLET UNDER THE TONGUE AT FIRST SIGN OF ATTACKS, MAY REPEAT EVERY 5 MINUTES UP TO 3 TABLETS, IF NO RELLIEF SEEK MEDICAL HELP 25 tablet 1  . omega-3 acid ethyl esters (LOVAZA) 1 g capsule TAKE 2 CAPSULES BY MOUTH TWICE DAILY 360 capsule 3  . VESICARE 5 MG tablet      No current facility-administered medications on file prior to visit.     Cardiovascular studies:   EKG 11/25/2018: Sinus rhythm 66 bpm. Normal EKG.  Echocardiogram 06/30/12: 1.Left ventricular cavity is normal in size.   Mild concentric hypertrophy.   Normal global wall motion.   Normal systolic global function.   Calculated EF 66%.   Doppler evidence of Grade I (impaired) diastolic dysfunction. 2.Left atrial cavity is mildly dilated. 3.Mitral valve structurally normal.   Trace mitral regurgitation.   Mitral valve inflow A > E ratio. 4.Tricuspid valve structurally normal.   Trace tricuspid regurgitation.  Lexiscan stress 06/18/12: 1. Resting EKG NSR, Poor R wave progression. Non specific ST-T change. Stress EKG was non diagnostic for ischemia. No ST-T changes of ischemia noted with pharmacologic stress testing.  Stress symptoms included light headed.  Stress terminated due to completion of protocol. 2. The perfusion study demonstrated mild diaphragmatic attenuation artifact both at rest and improved with stress. There was no evidence of ischemia. Dynamic gated images reveal the ventricular ejection fraction mildly depressed at 41%. Visually the EF appears normal. 3. This represents a low risk study.  Cardiac catheterization 2006:  Normal coronary arteries except for mild 10-20% focal stenosis in the LAD with normal left ventricle systolic function, 2+ mitral regurgitation.   Recent labs: No labs available   Review of Systems  Constitution: Negative for decreased appetite, malaise/fatigue, weight gain and weight loss.  HENT: Negative for congestion.   Eyes: Negative for visual disturbance.  Cardiovascular: Positive for chest pain and claudication (Occasional). Negative for dyspnea on exertion, leg swelling, palpitations and syncope.  Respiratory: Negative for cough.   Endocrine: Negative for cold intolerance.  Hematologic/Lymphatic: Does not bruise/bleed easily.  Skin: Negative for itching and rash.  Musculoskeletal: Positive for joint pain. Negative for myalgias.   Gastrointestinal: Negative for abdominal pain, nausea and vomiting.  Genitourinary: Negative for dysuria.  Neurological: Negative for dizziness and weakness.  Psychiatric/Behavioral: The patient is not nervous/anxious.   All other systems reviewed and are negative.        Vitals:   11/25/18 1408  BP: 137/80  Pulse: 80  Temp: (!) 97.3 F (36.3 C)  SpO2: 98%     Body mass index is 48.88 kg/m. Filed Weights   11/25/18 1408  Weight: 242 lb (109.8 kg)     Objective:   Physical Exam  Constitutional: She is oriented to person, place, and time. She appears well-developed and well-nourished. No distress.  Obesity   HENT:  Head: Normocephalic and atraumatic.  Eyes: Pupils are equal, round, and reactive  to light. Conjunctivae are normal.  Neck: No JVD present.  Cardiovascular: Normal rate, regular rhythm and intact distal pulses.  No murmur heard. Pulses:      Dorsalis pedis pulses are 1+ on the right side and 2+ on the left side.       Posterior tibial pulses are 0 on the right side and 1+ on the left side.  Pulmonary/Chest: Effort normal and breath sounds normal. She has no wheezes. She has no rales.  Abdominal: Soft. Bowel sounds are normal. There is no rebound.  Musculoskeletal:        General: No edema.  Lymphadenopathy:    She has no cervical adenopathy.  Neurological: She is alert and oriented to person, place, and time. No cranial nerve deficit.  Skin: Skin is warm and dry.  Psychiatric: She has a normal mood and affect.  Nursing note and vitals reviewed.         Assessment & Recommendations:   78 y.o. African American female with hypertension, hyperlipidemia, type 2 DM, mild nonobstructive coronary artery disease (cath 2006), monoclonal gammopathy, morbid obesity, referred for evaluation of chest pain.  Chest pain: Features of both typical and atypical angina.  Etiology may include coronary artery disease versus esophageal spasm.  Recommend isosorbide  mononitrate 30 mg daily.  Given her risk factors, will obtain echocardiogram and Lexiscan nuclear stress test-as her walking is limited due to hip pain.  Continue aspirin and statin.  Hypertension: Fairly well controlled.  Continue current medical therapy.  Diabetes: Continue management as per PCP.    Thank you for referring the patient to [place]. Please feel free to contact with any questions.  Nigel Mormon, MD [facility] Pager: [phone] Office: [phone] If no answer Cell [phone]

## 2018-11-25 ENCOUNTER — Ambulatory Visit: Payer: Medicare Other | Admitting: Cardiology

## 2018-11-25 ENCOUNTER — Other Ambulatory Visit: Payer: Self-pay

## 2018-11-25 ENCOUNTER — Encounter: Payer: Self-pay | Admitting: Cardiology

## 2018-11-25 VITALS — BP 137/80 | HR 80 | Temp 97.3°F | Ht 59.0 in | Wt 242.0 lb

## 2018-11-25 DIAGNOSIS — E782 Mixed hyperlipidemia: Secondary | ICD-10-CM

## 2018-11-25 DIAGNOSIS — R072 Precordial pain: Secondary | ICD-10-CM

## 2018-11-25 DIAGNOSIS — I10 Essential (primary) hypertension: Secondary | ICD-10-CM

## 2018-11-25 MED ORDER — ISOSORBIDE MONONITRATE ER 30 MG PO TB24: 30.0000 mg | ORAL_TABLET | Freq: Every day | ORAL | 3 refills | Status: DC

## 2018-12-13 ENCOUNTER — Telehealth: Payer: Self-pay

## 2018-12-13 NOTE — Telephone Encounter (Signed)
I called patient and left her a v/m to call the office so we can schedule an appointment because she has not been seen for her HTN since nov 2019 and she was supposed to f/u in feb. YRL,RMA

## 2018-12-14 ENCOUNTER — Telehealth: Payer: Self-pay

## 2018-12-14 NOTE — Telephone Encounter (Signed)
Called to make appt with pt but pt refused. Pt stated that she would call back when she got her husband situated. Offered pt a virtual visit and she still refused. Pt visit would need to be outside if scheduled to come to office

## 2018-12-20 ENCOUNTER — Ambulatory Visit (INDEPENDENT_AMBULATORY_CARE_PROVIDER_SITE_OTHER): Payer: Medicare Other

## 2018-12-20 DIAGNOSIS — R072 Precordial pain: Secondary | ICD-10-CM | POA: Diagnosis not present

## 2018-12-21 NOTE — Progress Notes (Deleted)
Patient referred by Glendale Chard, MD for chest pain  Subjective:   Rita Yoder, female    DOB: 1941/01/04, 78 y.o.   MRN: [id number]   No chief complaint on file.    HPI  78 y.o. African American female with hypertension, hyperlipidemia, type 2 DM, mild nonobstructive coronary artery disease (cath 2006), monoclonal gammopathy, morbid obesity, referred for evaluation of chest pain.  Patient has had episodes of retrosternal chest pain, dull, lasting for couple minutes.  Episodes have occurred both on exertion-such as walking, improved with sublingual nitroglycerin and rest; as well as while at rest at night after eating.  She denies any significant shortness of breath or any other associated symptoms.  She is compliant with medical therapy with well-controlled blood pressure and diabetes.  She usually walks with a walker.   Past Medical History:  Diagnosis Date  . Chronic kidney disease 12/2009  . Diabetes mellitus 2010  . Dyslipidemia   . Hypertension   . Monoclonal gammopathy 03/2010     Past Surgical History:  Procedure Laterality Date  . CHOLECYSTECTOMY    . HERNIA REPAIR    . ROTATOR CUFF REPAIR  1994  . THYROID SURGERY    . TONSILLECTOMY    . TOTAL ABDOMINAL HYSTERECTOMY       Social History   Socioeconomic History  . Marital status: Married    Spouse name: Not on file  . Number of children: 4  . Years of education: Not on file  . Highest education level: Not on file  Occupational History  . Not on file  Social Needs  . Financial resource strain: Not hard at all  . Food insecurity    Worry: Never true    Inability: Never true  . Transportation needs    Medical: No    Non-medical: No  Tobacco Use  . Smoking status: Never Smoker  . Smokeless tobacco: Never Used  Substance and Sexual Activity  . Alcohol use: No  . Drug use: Never  . Sexual activity: Not Currently  Lifestyle  . Physical activity    Days per week: 0 days    Minutes per  session: 0 min  . Stress: Not at all  Relationships  . Social Herbalist on phone: Not on file    Gets together: Not on file    Attends religious service: Not on file    Active member of club or organization: Not on file    Attends meetings of clubs or organizations: Not on file    Relationship status: Not on file  . Intimate partner violence    Fear of current or ex partner: No    Emotionally abused: No    Physically abused: No    Forced sexual activity: No  Other Topics Concern  . Not on file  Social History Narrative  . Not on file     Family History  Problem Relation Age of Onset  . Heart attack Brother   . Cancer Maternal Aunt   . Cancer Maternal Uncle      Current Outpatient Medications on File Prior to Visit  Medication Sig Dispense Refill  . aspirin 81 MG tablet Take 81 mg by mouth daily.      Marland Kitchen atorvastatin (LIPITOR) 20 MG tablet TAKE 1 TABLET BY MOUTH EVERY DAY 90 tablet 0  . Blood Glucose Calibration (OT ULTRA/FASTTK CNTRL SOLN) SOLN     . cholecalciferol (VITAMIN D) 400 UNITS TABS Take 2,000  Units by mouth daily.      . hydrochlorothiazide (MICROZIDE) 12.5 MG capsule TAKE 1 CAPSULE BY MOUTH EVERY DAY 90 capsule 0  . isosorbide mononitrate (IMDUR) 30 MG 24 hr tablet Take 1 tablet (30 mg total) by mouth daily. 90 tablet 3  . levothyroxine (SYNTHROID) 25 MCG tablet TAKE 1 TABLET BY MOUTH  DAILY BEFORE BREAKFAST 90 tablet 1  . losartan (COZAAR) 100 MG tablet TAKE 1 TABLET BY MOUTH  EVERY DAY 90 tablet 3  . Mirabegron (MYRBETRIQ PO) Take 25 mg by mouth daily.    . nitroGLYCERIN (NITROSTAT) 0.4 MG SL tablet DISSOLVE 1 TABLET UNDER THE TONGUE AT FIRST SIGN OF ATTACKS, MAY REPEAT EVERY 5 MINUTES UP TO 3 TABLETS, IF NO RELLIEF SEEK MEDICAL HELP 25 tablet 1  . omega-3 acid ethyl esters (LOVAZA) 1 g capsule TAKE 2 CAPSULES BY MOUTH TWICE DAILY 360 capsule 3  . solifenacin (VESICARE) 5 MG tablet Take 5 mg by mouth daily.     No current facility-administered  medications on file prior to visit.     Cardiovascular studies:  Echocardiogram 12/20/2018:  Normal LV systolic function with EF 56%. Left ventricle cavity is normal in size. Moderate concentric hypertrophy of the left ventricle. Normal global wall motion. Doppler evidence of grade I (impaired) diastolic dysfunction, normal LAP. Calculated EF 56%. Left atrial cavity is mild to moderately dilated by volume. Right atrial cavity is mild to moderately dilated. Mild calcification of the mitral valve annulus. Mild to moderate (Grade II) mitral regurgitation. Structurally normal tricuspid valve. Mild tricuspid regurgitation. Mild to moderate pulmonary hypertension. Estimated pulmonary artery systolic pressure is 42 mm Hg with RA pressure estimated at 8 mm Hg. RVSP measures 42 mmHg. IVC is normal in size with a respiratory response of <50%. May suggest elevated central venous pressure.  EKG 11/25/2018: Sinus rhythm 66 bpm. Normal EKG.  Echocardiogram 06/30/12: 1.Left ventricular cavity is normal in size.   Mild concentric hypertrophy.   Normal global wall motion.   Normal systolic global function.   Calculated EF 66%.   Doppler evidence of Grade I (impaired) diastolic dysfunction. 2.Left atrial cavity is mildly dilated. 3.Mitral valve structurally normal.   Trace mitral regurgitation.   Mitral valve inflow A > E ratio. 4.Tricuspid valve structurally normal.   Trace tricuspid regurgitation.  Lexiscan stress 06/18/12: 1. Resting EKG NSR, Poor R wave progression. Non specific ST-T change. Stress EKG was non diagnostic for ischemia. No ST-T changes of ischemia noted with pharmacologic stress testing.  Stress symptoms included light headed.  Stress terminated due to completion of protocol. 2. The perfusion study demonstrated mild diaphragmatic attenuation artifact both at rest and improved with stress. There was no evidence of ischemia. Dynamic gated images reveal the ventricular ejection fraction mildly  depressed at 41%. Visually the EF appears normal. 3. This represents a low risk study.  Cardiac catheterization 2006:  Normal coronary arteries except for mild 10-20% focal stenosis in the LAD with normal left ventricle systolic function, 2+ mitral regurgitation.   Recent labs: No labs available   Review of Systems  Constitution: Negative for decreased appetite, malaise/fatigue, weight gain and weight loss.  HENT: Negative for congestion.   Eyes: Negative for visual disturbance.  Cardiovascular: Positive for chest pain and claudication (Occasional). Negative for dyspnea on exertion, leg swelling, palpitations and syncope.  Respiratory: Negative for cough.   Endocrine: Negative for cold intolerance.  Hematologic/Lymphatic: Does not bruise/bleed easily.  Skin: Negative for itching and rash.  Musculoskeletal: Positive for joint  pain. Negative for myalgias.  Gastrointestinal: Negative for abdominal pain, nausea and vomiting.  Genitourinary: Negative for dysuria.  Neurological: Negative for dizziness and weakness.  Psychiatric/Behavioral: The patient is not nervous/anxious.   All other systems reviewed and are negative.       *** There were no vitals filed for this visit.  *** There is no height or weight on file to calculate BMI. There were no vitals filed for this visit.   Objective:   Physical Exam  Constitutional: She is oriented to person, place, and time. She appears well-developed and well-nourished. No distress.  Obesity   HENT:  Head: Normocephalic and atraumatic.  Eyes: Pupils are equal, round, and reactive to light. Conjunctivae are normal.  Neck: No JVD present.  Cardiovascular: Normal rate, regular rhythm and intact distal pulses.  No murmur heard. Pulses:      Dorsalis pedis pulses are 1+ on the right side and 2+ on the left side.       Posterior tibial pulses are 0 on the right side and 1+ on the left side.  Pulmonary/Chest: Effort normal and breath  sounds normal. She has no wheezes. She has no rales.  Abdominal: Soft. Bowel sounds are normal. There is no rebound.  Musculoskeletal:        General: No edema.  Lymphadenopathy:    She has no cervical adenopathy.  Neurological: She is alert and oriented to person, place, and time. No cranial nerve deficit.  Skin: Skin is warm and dry.  Psychiatric: She has a normal mood and affect.  Nursing note and vitals reviewed.         Assessment & Recommendations:   78 y.o. African American female with hypertension, hyperlipidemia, type 2 DM, mild nonobstructive coronary artery disease (cath 2006), monoclonal gammopathy, morbid obesity, referred for evaluation of chest pain.  Chest pain: Features of both typical and atypical angina.  Etiology may include coronary artery disease versus esophageal spasm.  Recommend isosorbide mononitrate 30 mg daily.  Given her risk factors, will obtain echocardiogram and Lexiscan nuclear stress test-as her walking is limited due to hip pain.  Continue aspirin and statin.  Hypertension: Fairly well controlled.  Continue current medical therapy.  Diabetes: Continue management as per PCP.    Thank you for referring the patient to [place]. Please feel free to contact with any questions.  Nigel Mormon, MD [facility] Pager: [phone] Office: [phone] If no answer Cell [phone]

## 2018-12-24 ENCOUNTER — Other Ambulatory Visit: Payer: Self-pay | Admitting: Internal Medicine

## 2018-12-27 ENCOUNTER — Ambulatory Visit: Payer: Medicare Other | Admitting: Cardiology

## 2018-12-28 ENCOUNTER — Telehealth: Payer: Self-pay

## 2018-12-28 NOTE — Telephone Encounter (Signed)
I returned the pt's daughter Rita Yoder call [phone] to let her know that the office is sorry to hear about Rita Yoder passing away but that the pt needed an appt to be evaluated for a medication to help her sleep because she was last in the office Nov. 2019 and the office needs current lab results for the pt especially due to her kidney function.

## 2018-12-29 ENCOUNTER — Other Ambulatory Visit: Payer: Self-pay | Admitting: Internal Medicine

## 2018-12-29 ENCOUNTER — Other Ambulatory Visit: Payer: Self-pay

## 2018-12-29 MED ORDER — TRAZODONE HCL 50 MG PO TABS: ORAL_TABLET | ORAL | 0 refills | Status: DC

## 2018-12-30 ENCOUNTER — Encounter: Payer: Self-pay | Admitting: Internal Medicine

## 2019-01-06 ENCOUNTER — Encounter: Payer: Self-pay | Admitting: Internal Medicine

## 2019-01-06 ENCOUNTER — Other Ambulatory Visit: Payer: Self-pay

## 2019-01-06 ENCOUNTER — Ambulatory Visit (INDEPENDENT_AMBULATORY_CARE_PROVIDER_SITE_OTHER): Payer: Medicare Other | Admitting: Internal Medicine

## 2019-01-06 VITALS — BP 132/82 | HR 74 | Temp 98.5°F | Ht 60.6 in | Wt 221.0 lb

## 2019-01-06 DIAGNOSIS — E782 Mixed hyperlipidemia: Secondary | ICD-10-CM

## 2019-01-06 DIAGNOSIS — R7309 Other abnormal glucose: Secondary | ICD-10-CM

## 2019-01-06 DIAGNOSIS — E1142 Type 2 diabetes mellitus with diabetic polyneuropathy: Secondary | ICD-10-CM | POA: Insufficient documentation

## 2019-01-06 DIAGNOSIS — Z6841 Body Mass Index (BMI) 40.0 and over, adult: Secondary | ICD-10-CM | POA: Insufficient documentation

## 2019-01-06 DIAGNOSIS — F4321 Adjustment disorder with depressed mood: Secondary | ICD-10-CM

## 2019-01-06 DIAGNOSIS — E039 Hypothyroidism, unspecified: Secondary | ICD-10-CM | POA: Diagnosis not present

## 2019-01-06 DIAGNOSIS — I10 Essential (primary) hypertension: Secondary | ICD-10-CM | POA: Diagnosis not present

## 2019-01-06 MED ORDER — LEVOTHYROXINE SODIUM 25 MCG PO TABS: ORAL_TABLET | ORAL | 1 refills | Status: DC

## 2019-01-06 MED ORDER — ATORVASTATIN CALCIUM 20 MG PO TABS: 20.0000 mg | ORAL_TABLET | Freq: Every day | ORAL | 1 refills | Status: DC

## 2019-01-06 MED ORDER — HYDROCHLOROTHIAZIDE 12.5 MG PO CAPS: ORAL_CAPSULE | ORAL | 1 refills | Status: DC

## 2019-01-06 NOTE — Patient Instructions (Signed)
Complicated Grief Grief is a normal response to the death of someone close to you. Feelings of fear, anger, and guilt can affect almost everyone who loses a loved one. It is also common to have symptoms of depression while you are grieving. These include problems with sleep, loss of appetite, and lack of energy. They may last for weeks or months after a loss. Complicated grief is different from normal grief or depression. Normal grieving involves sadness and feelings of loss, but those feelings get better and heal over time. Complicated grief is a severe type of grief that lasts for a long time, usually for several months to a year or longer. It interferes with your ability to function normally. Complicated grief may require treatment from a mental health care provider. What are the causes? The cause of this condition is not known. It is not clear why some people continue to struggle with grief and others do not. What increases the risk? You are more likely to develop this condition if:  The death of your loved one was sudden or unexpected.  The death of your loved one was due to a violent event.  Your loved one died from suicide.  Your loved one was a child or a young person.  You were very close to your loved one, or you were dependent on him or her.  You have a history of depression or anxiety. What are the signs or symptoms? Symptoms of this condition include:  Feeling disbelief or having a lack of emotion (numbness).  Being unable to enjoy good memories of your loved one.  Needing to avoid anything or anyone that reminds you of your loved one.  Being unable to stop thinking about the death.  Feeling intense anger or guilt.  Feeling alone and hopeless.  Feeling that your life is meaningless and empty.  Losing the desire to move on with your life. How is this diagnosed? This condition may be diagnosed based on:  Your symptoms. Complicated grief will be diagnosed if you have  ongoing symptoms of grief for 6-12 months or longer.  The effect of symptoms on your life. You may be diagnosed with this condition if your symptoms are interfering with your ability to live your life. Your health care provider may recommend that you see a mental health care provider. Many symptoms of depression are similar to the symptoms of complicated grief. It is important to be evaluated for complicated grief along with other mental health conditions. How is this treated? This condition is most commonly treated with talk therapy. This therapy is offered by a mental health specialist (psychiatrist). During therapy:  You will learn healthy ways to cope with the loss of your loved one.  Your mental health care provider may recommend antidepressant medicines. Follow these instructions at home: Lifestyle   Take care of yourself. ? Eat on a regular basis, and maintain a healthy diet. Eat plenty of fruits, vegetables, lean protein, and whole grains. ? Try to get some exercise each day. Aim for 30 minutes of exercise on most days of the week. ? Keep a consistent sleep schedule. Try to get 8 or more hours of sleep each night. ? Start doing the things that you used to enjoy.  Do not use drugs or alcohol to ease your symptoms.  Spend time with friends and loved ones. General instructions  Take over-the-counter and prescription medicines only as told by your health care provider.  Consider joining a grief (bereavement) support group   to help you deal with your loss.  Keep all follow-up visits as told by your health care provider. This is important. Contact a health care provider if:  Your symptoms prevent you from functioning normally.  Your symptoms do not get better with treatment. Get help right away if:  You have serious thoughts about hurting yourself or someone else.  You have suicidal feelings. If you ever feel like you may hurt yourself or others, or have thoughts about taking  your own life, get help right away. You can go to your nearest emergency department or call:  Your local emergency services (911 in the [place]  A suicide crisis helpline, such as the [place] at (951)150-5264. This is open 24 hours a day. Summary  Complicated grief is a severe type of grief that lasts for a long time. This grief is not likely to go away on its own. Get the help you need.  Some griefs are more difficult than others and can cause this condition. You may need a certain type of treatment to help you recover if the loss of your loved one was sudden, violent, or due to suicide.  You may feel guilty about moving on with your life. Getting help does not mean that you are forgetting your loved one. It means that you are taking care of yourself.  Complicated grief is best treated with talk therapy. Medicines may also be prescribed.  Seek the help you need, and find support that will help you recover. This information is not intended to replace advice given to you by your health care provider. Make sure you discuss any questions you have with your health care provider. Document Released: 02/24/2005 Document Revised: 02/06/2017 Document Reviewed: 12/10/2016 Elsevier Patient Education  2020 [employer].

## 2019-01-06 NOTE — Progress Notes (Signed)
Subjective:     Patient ID: Rita Yoder , female    DOB: 14-Jan-1941 , 78 y.o.   MRN: [id number]   Chief Complaint  Patient presents with  . Hypertension    HPI Pt is here for HTN FU, normally run around 120's/ 80's.  Her husband died last week and has been going though a lot this week.  She has not check her glucose in a long time. Denies polydipsia or polyuria.     Past Medical History:  Diagnosis Date  . Chronic kidney disease 12/2009  . Diabetes mellitus 2010  . Dyslipidemia   . Hypertension   . Monoclonal gammopathy 03/2010     Family History  Problem Relation Age of Onset  . Heart attack Brother   . Cancer Maternal Aunt   . Cancer Maternal Uncle      Current Outpatient Medications:  .  acetaminophen (TYLENOL) 500 MG tablet, Take 500 mg by mouth every 6 (six) hours as needed., Disp: , Rfl:  .  aspirin 81 MG tablet, Take 81 mg by mouth daily.  , Disp: , Rfl:  .  atorvastatin (LIPITOR) 20 MG tablet, TAKE 1 TABLET BY MOUTH EVERY DAY, Disp: 90 tablet, Rfl: 0 .  cholecalciferol (VITAMIN D) 400 UNITS TABS, Take 2,000 Units by mouth daily.  , Disp: , Rfl:  .  hydrochlorothiazide (MICROZIDE) 12.5 MG capsule, TAKE 1 CAPSULE BY MOUTH EVERY DAY, Disp: 90 capsule, Rfl: 0 .  isosorbide mononitrate (IMDUR) 30 MG 24 hr tablet, Take 1 tablet (30 mg total) by mouth daily., Disp: 90 tablet, Rfl: 3 .  levothyroxine (SYNTHROID) 25 MCG tablet, TAKE 1 TABLET BY MOUTH  DAILY BEFORE BREAKFAST, Disp: 90 tablet, Rfl: 3 .  nitroGLYCERIN (NITROSTAT) 0.4 MG SL tablet, DISSOLVE 1 TABLET UNDER THE TONGUE AT FIRST SIGN OF ATTACKS, MAY REPEAT EVERY 5 MINUTES UP TO 3 TABLETS, IF NO RELLIEF SEEK MEDICAL HELP, Disp: 25 tablet, Rfl: 1 .  omega-3 acid ethyl esters (LOVAZA) 1 g capsule, TAKE 2 CAPSULES BY MOUTH TWICE DAILY, Disp: 360 capsule, Rfl: 3 .  solifenacin (VESICARE) 5 MG tablet, Take 10 mg by mouth daily. , Disp: , Rfl:  .  traZODone (DESYREL) 50 MG tablet, 1/2 tab po qhs prn, Disp: 30  tablet, Rfl: 0 .  Blood Glucose Calibration (OT ULTRA/FASTTK CNTRL SOLN) SOLN, , Disp: , Rfl:  .  losartan (COZAAR) 100 MG tablet, TAKE 1 TABLET BY MOUTH  EVERY DAY (Patient not taking: Reported on 01/06/2019), Disp: 90 tablet, Rfl: 3 .  Mirabegron (MYRBETRIQ PO), Take 25 mg by mouth daily., Disp: , Rfl:    No Known Allergies   Review of Systems   Review of Systems  Constitutional: Negative for diaphoresis and unexpected weight change, her appetite is low.  HENT: Negative for tinnitus.   Eyes: Negative for visual disturbance.  Respiratory: Negative for chest tightness and shortness of breath.   Cardiovascular: Negative for chest pain, palpitations and leg swelling.  Gastrointestinal: Negative for constipation, diarrhea and nausea.  Endocrine: Negative for polydipsia, polyphagia and polyuria.  Genitourinary: Negative for dysuria and frequency.  Skin: Negative for rash and wound.  Neurological: Negative for dizziness, speech difficulty, weakness, numbness and headaches.  Psych- grieving the loss of her husband  Today's Vitals   01/06/19 1538  BP: 132/82  Pulse: 74  Temp: 98.5 F (36.9 C)  TempSrc: Oral  Weight: 221 lb (100.2 kg)  Height: 5' 0.6" (1.539 m)   Body mass index is 42.31 kg/m.  Objective:  Physical Exam  Constitutional: She is oriented to person, place, and time. She appears well-developed and well-nourished. No distress.  HENT:  Head: Normocephalic and atraumatic.  Right Ear: External ear normal.  Left Ear: External ear normal.  Nose: Nose normal.  Eyes: Conjunctivae are normal. Right eye exhibits no discharge. Left eye exhibits no discharge. No scleral icterus.  Neck: Neck supple. No thyromegaly present.  No carotid bruits bilaterally  Cardiovascular: Normal rate and regular rhythm.  No murmur heard. Pulmonary/Chest: Effort normal and breath sounds normal. No respiratory distress.  Musculoskeletal: Normal range of motion. She exhibits no edema.   Lymphadenopathy:    She has no cervical adenopathy.  Neurological: She is alert and oriented to person, place, and time.  Skin: Skin is warm and dry. Capillary refill takes less than 2 seconds. No rash noted. She is not diaphoretic.  Psychiatric: She has a normal mood and affect. Her behavior is normal. Judgment and thought content normal.  Nursing note reviewed.    Assessment And Plan:   1. Essential hypertension- stable. May continue current med.  - CMP14 + Anion Gap - CBC no Diff  2. Mixed hyperlipidemia- chronic.  - Lipid Profile  3. Hypothyroidism, unspecified type- chronic. May continue same med dose until we get labs back.  - TSH - T3, free - T4, Free  4. Abnormal glucose- chronic - Hemoglobin A1c  5. Grieving- new. No action.   Fu in 3-6 months with her PCP for HTN FU  Stevie Charter RODRIGUEZ-SOUTHWORTH, PA-C    THE PATIENT IS ENCOURAGED TO PRACTICE SOCIAL DISTANCING DUE TO THE COVID-19 PANDEMIC.

## 2019-01-07 LAB — LIPID PANEL
Chol/HDL Ratio: 2.9 ratio (ref 0.0–4.4)
Cholesterol, Total: 141 mg/dL (ref 100–199)
HDL: 49 mg/dL (ref >39)
LDL Chol Calc (NIH): 71 mg/dL (ref 0–99)
Triglycerides: 118 mg/dL (ref 0–149)
VLDL Cholesterol Cal: 21 mg/dL (ref 5–40)

## 2019-01-07 LAB — CMP14 + ANION GAP
ALT: 27 IU/L (ref 0–32)
AST: 28 IU/L (ref 0–40)
Albumin/Globulin Ratio: 1.5 (ref 1.2–2.2)
Albumin: 4.1 g/dL (ref 3.7–4.7)
Alkaline Phosphatase: 98 IU/L (ref 39–117)
Anion Gap: 13 mmol/L (ref 10.0–18.0)
BUN/Creatinine Ratio: 24 (ref 12–28)
BUN: 44 mg/dL — ABNORMAL HIGH (ref 8–27)
Bilirubin Total: 0.3 mg/dL (ref 0.0–1.2)
CO2: 21 mmol/L (ref 20–29)
Calcium: 9.7 mg/dL (ref 8.7–10.3)
Chloride: 107 mmol/L — ABNORMAL HIGH (ref 96–106)
Creatinine, Ser: 1.86 mg/dL — ABNORMAL HIGH (ref 0.57–1.00)
GFR calc Af Amer: 29 mL/min/{1.73_m2} — ABNORMAL LOW (ref >59)
GFR calc non Af Amer: 26 mL/min/{1.73_m2} — ABNORMAL LOW (ref >59)
Globulin, Total: 2.8 g/dL (ref 1.5–4.5)
Glucose: 108 mg/dL — ABNORMAL HIGH (ref 65–99)
Potassium: 5 mmol/L (ref 3.5–5.2)
Sodium: 141 mmol/L (ref 134–144)
Total Protein: 6.9 g/dL (ref 6.0–8.5)

## 2019-01-07 LAB — CBC
Hematocrit: 33.3 % — ABNORMAL LOW (ref 34.0–46.6)
Hemoglobin: 11.3 g/dL (ref 11.1–15.9)
MCH: 32.8 pg (ref 26.6–33.0)
MCHC: 33.9 g/dL (ref 31.5–35.7)
MCV: 97 fL (ref 79–97)
Platelets: 253 10*3/uL (ref 150–450)
RBC: 3.44 x10E6/uL — ABNORMAL LOW (ref 3.77–5.28)
RDW: 10.9 % — ABNORMAL LOW (ref 11.7–15.4)
WBC: 8.7 10*3/uL (ref 3.4–10.8)

## 2019-01-07 LAB — T4, FREE: Free T4: 1.27 ng/dL (ref 0.82–1.77)

## 2019-01-07 LAB — TSH: TSH: 2.06 u[IU]/mL (ref 0.450–4.500)

## 2019-01-07 LAB — HEMOGLOBIN A1C
Est. average glucose Bld gHb Est-mCnc: 97 mg/dL
Hgb A1c MFr Bld: 5 % (ref 4.8–5.6)

## 2019-01-07 LAB — T3, FREE: T3, Free: 2.6 pg/mL (ref 2.0–4.4)

## 2019-01-13 ENCOUNTER — Telehealth: Payer: Self-pay

## 2019-01-13 ENCOUNTER — Encounter: Payer: Self-pay | Admitting: Internal Medicine

## 2019-01-13 NOTE — Telephone Encounter (Signed)
Spoke w/pt to give her provider message, pt stated ok, per provider pt is ok to just come back at her scheduled physical appt the end of this month   Notes recorded by Shelby Mattocks, PA-C on 01/13/2019 at 8:13 AM EST  Also her cell count is stable  ------   Notes recorded by Shelby Mattocks, PA-C on 01/13/2019 at 8:13 AM EST  Please inform pt that her kidney function is a little worse than before. If she is taking the Myrbetric, needs to d/c since this can affect her kidney and needs to d/c the HCTZ. After one week of being off this, needs to come in for BP check and kidney function recheck via provider or MA visit. I have ordered the blood work. The rest of her labs are normal.   Rodriguez-Southworth, Sunday Spillers, PA-C  Candiss [place]se T, [place]        Also her cell count is stable

## 2019-01-26 ENCOUNTER — Ambulatory Visit: Payer: Medicare Other | Admitting: Internal Medicine

## 2019-01-26 ENCOUNTER — Ambulatory Visit: Payer: Medicare Other

## 2019-02-07 ENCOUNTER — Other Ambulatory Visit: Payer: Self-pay

## 2019-02-07 ENCOUNTER — Ambulatory Visit (INDEPENDENT_AMBULATORY_CARE_PROVIDER_SITE_OTHER): Payer: Medicare Other | Admitting: Internal Medicine

## 2019-02-07 ENCOUNTER — Encounter: Payer: Self-pay | Admitting: Internal Medicine

## 2019-02-07 VITALS — BP 132/86 | HR 54 | Temp 98.1°F | Ht <= 58 in | Wt 226.2 lb

## 2019-02-07 DIAGNOSIS — Z6841 Body Mass Index (BMI) 40.0 and over, adult: Secondary | ICD-10-CM

## 2019-02-07 DIAGNOSIS — E1142 Type 2 diabetes mellitus with diabetic polyneuropathy: Secondary | ICD-10-CM

## 2019-02-07 DIAGNOSIS — Z0000 Encounter for general adult medical examination without abnormal findings: Secondary | ICD-10-CM | POA: Diagnosis not present

## 2019-02-07 DIAGNOSIS — I10 Essential (primary) hypertension: Secondary | ICD-10-CM

## 2019-02-07 DIAGNOSIS — E782 Mixed hyperlipidemia: Secondary | ICD-10-CM | POA: Diagnosis not present

## 2019-02-07 NOTE — Patient Instructions (Signed)
Health Maintenance, Female Adopting a healthy lifestyle and getting preventive care are important in promoting health and wellness. Ask your health care provider about:  The right schedule for you to have regular tests and exams.  Things you can do on your own to prevent diseases and keep yourself healthy. What should I know about diet, weight, and exercise? Eat a healthy diet   Eat a diet that includes plenty of vegetables, fruits, low-fat dairy products, and lean protein.  Do not eat a lot of foods that are high in solid fats, added sugars, or sodium. Maintain a healthy weight Body mass index (BMI) is used to identify weight problems. It estimates body fat based on height and weight. Your health care provider can help determine your BMI and help you achieve or maintain a healthy weight. Get regular exercise Get regular exercise. This is one of the most important things you can do for your health. Most adults should:  Exercise for at least 150 minutes each week. The exercise should increase your heart rate and make you sweat (moderate-intensity exercise).  Do strengthening exercises at least twice a week. This is in addition to the moderate-intensity exercise.  Spend less time sitting. Even light physical activity can be beneficial. Watch cholesterol and blood lipids Have your blood tested for lipids and cholesterol at 79 years of age, then have this test every 5 years. Have your cholesterol levels checked more often if:  Your lipid or cholesterol levels are high.  You are older than 79 years of age.  You are at high risk for heart disease. What should I know about cancer screening? Depending on your health history and family history, you may need to have cancer screening at various ages. This may include screening for:  Breast cancer.  Cervical cancer.  Colorectal cancer.  Skin cancer.  Lung cancer. What should I know about heart disease, diabetes, and high blood  pressure? Blood pressure and heart disease  High blood pressure causes heart disease and increases the risk of stroke. This is more likely to develop in people who have high blood pressure readings, are of African descent, or are overweight.  Have your blood pressure checked: ? Every 3-5 years if you are 18-39 years of age. ? Every year if you are 40 years old or older. Diabetes Have regular diabetes screenings. This checks your fasting blood sugar level. Have the screening done:  Once every three years after age 40 if you are at a normal weight and have a low risk for diabetes.  More often and at a younger age if you are overweight or have a high risk for diabetes. What should I know about preventing infection? Hepatitis B If you have a higher risk for hepatitis B, you should be screened for this virus. Talk with your health care provider to find out if you are at risk for hepatitis B infection. Hepatitis C Testing is recommended for:  Everyone born from 1945 through 1965.  Anyone with known risk factors for hepatitis C. Sexually transmitted infections (STIs)  Get screened for STIs, including gonorrhea and chlamydia, if: ? You are sexually active and are younger than 79 years of age. ? You are older than 79 years of age and your health care provider tells you that you are at risk for this type of infection. ? Your sexual activity has changed since you were last screened, and you are at increased risk for chlamydia or gonorrhea. Ask your health care provider if   you are at risk.  Ask your health care provider about whether you are at high risk for HIV. Your health care provider may recommend a prescription medicine to help prevent HIV infection. If you choose to take medicine to prevent HIV, you should first get tested for HIV. You should then be tested every 3 months for as long as you are taking the medicine. Pregnancy  If you are about to stop having your period (premenopausal) and  you may become pregnant, seek counseling before you get pregnant.  Take 400 to 800 micrograms (mcg) of folic acid every day if you become pregnant.  Ask for birth control (contraception) if you want to prevent pregnancy. Osteoporosis and menopause Osteoporosis is a disease in which the bones lose minerals and strength with aging. This can result in bone fractures. If you are 65 years old or older, or if you are at risk for osteoporosis and fractures, ask your health care provider if you should:  Be screened for bone loss.  Take a calcium or vitamin D supplement to lower your risk of fractures.  Be given hormone replacement therapy (HRT) to treat symptoms of menopause. Follow these instructions at home: Lifestyle  Do not use any products that contain nicotine or tobacco, such as cigarettes, e-cigarettes, and chewing tobacco. If you need help quitting, ask your health care provider.  Do not use street drugs.  Do not share needles.  Ask your health care provider for help if you need support or information about quitting drugs. Alcohol use  Do not drink alcohol if: ? Your health care provider tells you not to drink. ? You are pregnant, may be pregnant, or are planning to become pregnant.  If you drink alcohol: ? Limit how much you use to 0-1 drink a day. ? Limit intake if you are breastfeeding.  Be aware of how much alcohol is in your drink. In the [place] one drink equals one 12 oz bottle of beer (355 mL), one 5 oz glass of wine (148 mL), or one 1 oz glass of hard liquor (44 mL). General instructions  Schedule regular health, dental, and eye exams.  Stay current with your vaccines.  Tell your health care provider if: ? You often feel depressed. ? You have ever been abused or do not feel safe at home. Summary  Adopting a healthy lifestyle and getting preventive care are important in promoting health and wellness.  Follow your health care provider's instructions about healthy  diet, exercising, and getting tested or screened for diseases.  Follow your health care provider's instructions on monitoring your cholesterol and blood pressure. This information is not intended to replace advice given to you by your health care provider. Make sure you discuss any questions you have with your health care provider. Document Released: 09/09/2010 Document Revised: 02/17/2018 Document Reviewed: 02/17/2018 Elsevier Patient Education  2020 [employer] Inc[employer]  

## 2019-02-08 LAB — POCT URINALYSIS DIPSTICK
Bilirubin, UA: NEGATIVE
Blood, UA: NEGATIVE
Glucose, UA: NEGATIVE
Ketones, UA: NEGATIVE
Leukocytes, UA: NEGATIVE
Nitrite, UA: NEGATIVE
Protein, UA: NEGATIVE
Spec Grav, UA: 1.02 (ref 1.010–1.025)
Urobilinogen, UA: 0.2 E.U./dL
pH, UA: 6 (ref 5.0–8.0)

## 2019-02-08 LAB — POCT UA - MICROALBUMIN
Albumin/Creatinine Ratio, Urine, POC: <30
Creatinine, POC: 100 mg/dL
Microalbumin Ur, POC: 30 mg/L

## 2019-02-10 ENCOUNTER — Other Ambulatory Visit: Payer: Self-pay

## 2019-02-10 MED ORDER — ATORVASTATIN CALCIUM 20 MG PO TABS: 20.0000 mg | ORAL_TABLET | Freq: Every day | ORAL | 1 refills | Status: DC

## 2019-02-11 ENCOUNTER — Ambulatory Visit: Payer: Medicare Other

## 2019-02-11 LAB — HM DIABETES EYE EXAM

## 2019-02-17 ENCOUNTER — Encounter: Payer: Self-pay | Admitting: Internal Medicine

## 2019-02-28 NOTE — Progress Notes (Signed)
This visit occurred during the SARS-CoV-2 public health emergency.  Safety protocols were in place, including screening questions prior to the visit, additional usage of staff PPE, and extensive cleaning of exam room while observing appropriate contact time as indicated for disinfecting solutions.  Subjective:     Patient ID: Rita Yoder , female    DOB: 1940-05-29 , 78 y.o.   MRN: [id number]   Chief Complaint  Patient presents with  . Annual Exam  . Diabetes  . Hypertension    HPI  She is here today for a full physical examination. She is no longer followed by GYN. She has no specific concerns or complaints at this time.   Diabetes She presents for her follow-up diabetic visit. She has type 2 diabetes mellitus. There are no hypoglycemic associated symptoms. There are no diabetic associated symptoms. Pertinent negatives for diabetes include no blurred vision and no chest pain. There are no hypoglycemic complications. There are no diabetic complications. Risk factors for coronary artery disease include diabetes mellitus, dyslipidemia, hypertension, obesity, sedentary lifestyle and post-menopausal. She is compliant with treatment most of the time. She never participates in exercise. An ACE inhibitor/angiotensin II receptor blocker is not being taken.  Hypertension This is a chronic problem. The current episode started more than 1 year ago. The problem has been gradually improving since onset. The problem is controlled. Pertinent negatives include no blurred vision, chest pain, palpitations or shortness of breath. The current treatment provides moderate improvement.     Past Medical History:  Diagnosis Date  . Chronic kidney disease 12/2009  . Diabetes mellitus 2010  . Dyslipidemia   . Hypertension   . Monoclonal gammopathy 03/2010     Family History  Problem Relation Age of Onset  . Healthy Mother   . Heart attack Brother   . Cancer Maternal Aunt   . Cancer Maternal Uncle    . Heart Problems Maternal Grandmother      Current Outpatient Medications:  .  acetaminophen (TYLENOL) 500 MG tablet, Take 500 mg by mouth every 6 (six) hours as needed., Disp: , Rfl:  .  aspirin 81 MG tablet, Take 81 mg by mouth daily.  , Disp: , Rfl:  .  Blood Glucose Calibration (OT ULTRA/FASTTK CNTRL SOLN) SOLN, , Disp: , Rfl:  .  cholecalciferol (VITAMIN D) 400 UNITS TABS, Take 2,000 Units by mouth daily.  , Disp: , Rfl:  .  hydrochlorothiazide (MICROZIDE) 12.5 MG capsule, TAKE 1 CAPSULE BY MOUTH EVERY DAY, Disp: 90 capsule, Rfl: 1 .  isosorbide mononitrate (IMDUR) 30 MG 24 hr tablet, Take 1 tablet (30 mg total) by mouth daily., Disp: 90 tablet, Rfl: 3 .  levothyroxine (SYNTHROID) 25 MCG tablet, TAKE 1 TABLET BY MOUTH  DAILY BEFORE BREAKFAST, Disp: 90 tablet, Rfl: 1 .  Mirabegron (MYRBETRIQ PO), Take 25 mg by mouth daily., Disp: , Rfl:  .  nitroGLYCERIN (NITROSTAT) 0.4 MG SL tablet, DISSOLVE 1 TABLET UNDER THE TONGUE AT FIRST SIGN OF ATTACKS, MAY REPEAT EVERY 5 MINUTES UP TO 3 TABLETS, IF NO RELLIEF SEEK MEDICAL HELP, Disp: 25 tablet, Rfl: 1 .  omega-3 acid ethyl esters (LOVAZA) 1 g capsule, TAKE 2 CAPSULES BY MOUTH TWICE DAILY, Disp: 360 capsule, Rfl: 3 .  traZODone (DESYREL) 50 MG tablet, 1/2 tab po qhs prn, Disp: 30 tablet, Rfl: 0 .  atorvastatin (LIPITOR) 20 MG tablet, Take 1 tablet (20 mg total) by mouth daily., Disp: 90 tablet, Rfl: 1   No Known Allergies  The patient states she uses post menopausal status for birth control. Last LMP was No LMP recorded.. Negative for Dysmenorrhea Negative for: breast discharge, breast lump(s), breast pain and breast self exam. Associated symptoms include abnormal vaginal bleeding. Pertinent negatives include abnormal bleeding (hematology), anxiety, decreased libido, depression, difficulty falling sleep, dyspareunia, history of infertility, nocturia, sexual dysfunction, sleep disturbances, urinary incontinence, urinary urgency, vaginal discharge  and vaginal itching. Diet regular.The patient states her exercise level is    . The patient's tobacco use is:  Social History   Tobacco Use  Smoking Status Never Smoker  Smokeless Tobacco Never Used  . She has been exposed to passive smoke. The patient's alcohol use is:  Social History   Substance and Sexual Activity  Alcohol Use No    Review of Systems  Constitutional: Negative.   HENT: Negative.   Eyes: Negative.  Negative for blurred vision.  Respiratory: Negative.  Negative for shortness of breath.   Cardiovascular: Negative.  Negative for chest pain and palpitations.  Endocrine: Negative.   Genitourinary: Negative.   Musculoskeletal: Negative.   Skin: Negative.   Allergic/Immunologic: Negative.   Neurological: Negative.   Hematological: Negative.   Psychiatric/Behavioral: Negative.      Today's Vitals   02/07/19 1441  BP: 132/86  Pulse: (!) 54  Temp: 98.1 F (36.7 C)  TempSrc: Oral  Weight: 226 lb 3.2 oz (102.6 kg)  Height: 4' 8.2" (1.427 m)  PainSc: 0-No pain   Body mass index is 50.35 kg/m.   Objective:  Physical Exam Vitals and nursing note reviewed.  Constitutional:      Appearance: Normal appearance. She is obese.  HENT:     Head: Normocephalic and atraumatic.     Right Ear: Tympanic membrane, ear canal and external ear normal.     Left Ear: Tympanic membrane, ear canal and external ear normal.     Nose:     Comments: Deferred, masked    Mouth/Throat:     Comments: Deferred, masked Eyes:     Extraocular Movements: Extraocular movements intact.     Conjunctiva/sclera: Conjunctivae normal.     Pupils: Pupils are equal, round, and reactive to light.  Cardiovascular:     Rate and Rhythm: Normal rate and regular rhythm.     Pulses: Normal pulses.          Dorsalis pedis pulses are 2+ on the right side and 2+ on the left side.     Heart sounds: Normal heart sounds.  Pulmonary:     Effort: Pulmonary effort is normal.     Breath sounds: Normal  breath sounds.  Chest:     Breasts: Tanner Score is 5.        Right: Normal.        Left: Normal.  Abdominal:     General: Bowel sounds are normal.     Palpations: Abdomen is soft.     Comments: Rounded, soft. No tenderness.   Genitourinary:    Comments: deferred Musculoskeletal:        General: Normal range of motion.     Cervical back: Normal range of motion and neck supple.  Feet:     Right foot:     Protective Sensation: 5 sites tested. 5 sites sensed.     Skin integrity: Callus and dry skin present.     Toenail Condition: Right toenails are long.     Left foot:     Protective Sensation: 5 sites tested. 5 sites sensed.  Skin integrity: Callus and dry skin present.     Toenail Condition: Left toenails are long.  Skin:    General: Skin is warm and dry.  Neurological:     General: No focal deficit present.     Mental Status: She is alert and oriented to person, place, and time.  Psychiatric:        Mood and Affect: Mood normal.        Behavior: Behavior normal.         Assessment And Plan:     1. Routine general medical examination at health care facility  A full exam was performed. She is encouraged to perform monthly self breast exams. PATIENT HAS BEEN ADVISED TO GET 30-45 MINUTES REGULAR EXERCISE NO LESS THAN FOUR TO FIVE DAYS PER WEEK - BOTH WEIGHTBEARING EXERCISES AND AEROBIC ARE RECOMMENDED.  SHE IS ADVISED TO FOLLOW A HEALTHY DIET WITH AT LEAST SIX FRUITS/VEGGIES PER DAY, DECREASE INTAKE OF RED MEAT, AND TO INCREASE FISH INTAKE TO TWO DAYS PER WEEK.  MEATS/FISH SHOULD NOT BE FRIED, BAKED OR BROILED IS PREFERABLE.  I SUGGEST WEARING SPF 50 SUNSCREEN ON EXPOSED PARTS AND ESPECIALLY WHEN IN THE DIRECT SUNLIGHT FOR AN EXTENDED PERIOD OF TIME.  PLEASE AVOID FAST FOOD RESTAURANTS AND INCREASE YOUR WATER INTAKE.   2. Diabetic polyneuropathy associated with type 2 diabetes mellitus ([place])  Diabetic foot exam was performed. I DISCUSSED WITH THE PATIENT AT LENGTH REGARDING  THE GOALS OF GLYCEMIC CONTROL AND POSSIBLE LONG-TERM COMPLICATIONS.  I  ALSO STRESSED THE IMPORTANCE OF COMPLIANCE WITH HOME GLUCOSE MONITORING, DIETARY RESTRICTIONS INCLUDING AVOIDANCE OF SUGARY DRINKS/PROCESSED FOODS,  ALONG WITH REGULAR EXERCISE.  I  ALSO STRESSED THE IMPORTANCE OF ANNUAL EYE EXAMS, SELF FOOT CARE AND COMPLIANCE WITH OFFICE VISITS.  - EKG 12-Lead - POCT Urinalysis Dipstick (81002) - POCT UA - Microalbumin  3. Essential hypertension  Chronic, controlled. She will continue with current meds. She is encouraged to avoid adding salt to her foods. EKG performed, no new changes noted. She will rto in six months for re-evaluation.   4. Mixed hyperlipidemia  Chronic. She is encouraged to avoid fried foods, increase daily activity and to increase her fish intake.   5. Class 3 severe obesity due to excess calories with serious comorbidity and body mass index (BMI) of 50.0 to 59.9 in adult [facility])  She is encouraged to strive for BMI less than 40 to decrease cardiac risk. She is encouraged to perform chair exercise five days per week and to decrease her portion sizes. Also encouraged to cut back on her intake of sugary beverages.     [name], MD    THE PATIENT IS ENCOURAGED TO PRACTICE SOCIAL DISTANCING DUE TO THE COVID-19 PANDEMIC.

## 2019-04-13 DIAGNOSIS — Z1231 Encounter for screening mammogram for malignant neoplasm of breast: Secondary | ICD-10-CM | POA: Diagnosis not present

## 2019-04-13 LAB — HM MAMMOGRAPHY

## 2019-04-18 ENCOUNTER — Encounter: Payer: Self-pay | Admitting: Internal Medicine

## 2019-04-25 ENCOUNTER — Encounter: Payer: Self-pay | Admitting: Cardiology

## 2019-04-25 ENCOUNTER — Other Ambulatory Visit: Payer: Self-pay

## 2019-04-25 ENCOUNTER — Ambulatory Visit: Payer: Medicare Other | Admitting: Cardiology

## 2019-04-25 VITALS — BP 142/64 | HR 65 | Temp 94.4°F | Ht <= 58 in | Wt 219.0 lb

## 2019-04-25 DIAGNOSIS — I10 Essential (primary) hypertension: Secondary | ICD-10-CM

## 2019-04-25 DIAGNOSIS — E782 Mixed hyperlipidemia: Secondary | ICD-10-CM

## 2019-04-25 NOTE — Progress Notes (Signed)
Patient referred by Glendale Chard, MD for chest pain  Subjective:   Rita Yoder, female    DOB: 04/22/1940, 79 y.o.   MRN: [id number]   Chief Complaint  Patient presents with  . Chest Pain  . Follow-up  . Results     HPI  79 y.o. African American female with hypertension, hyperlipidemia, type 2 DM, mild nonobstructive coronary artery disease (cath 2006), monoclonal gammopathy, morbid obesity  Nuclear stress test did not show definite evidence of ischemia. Echocardiogram findings detailed below. Since her last visit, she lost her husband. She had one episode of dizziness getting off the table at her mammogram. She has not had any chest pain, shortness of breath. Cr increased to 1.8 on BMP in 12/2018. She is seeing nephrologist Dr. Moshe Cipro.  Initial consultation HPI 11/2018: Patient has had episodes of retrosternal chest pain, dull, lasting for couple minutes.  Episodes have occurred both on exertion-such as walking, improved with sublingual nitroglycerin and rest; as well as while at rest at night after eating.  She denies any significant shortness of breath or any other associated symptoms.  She is compliant with medical therapy with well-controlled blood pressure and diabetes.  She usually walks with a walker.   Current Outpatient Medications on File Prior to Visit  Medication Sig Dispense Refill  . acetaminophen (TYLENOL) 500 MG tablet Take 500 mg by mouth every 6 (six) hours as needed.    Marland Kitchen aspirin 81 MG tablet Take 81 mg by mouth daily.      Marland Kitchen atorvastatin (LIPITOR) 20 MG tablet Take 1 tablet (20 mg total) by mouth daily. 90 tablet 1  . Blood Glucose Calibration (OT ULTRA/FASTTK CNTRL SOLN) SOLN     . cholecalciferol (VITAMIN D) 400 UNITS TABS Take 2,000 Units by mouth daily.      . hydrochlorothiazide (MICROZIDE) 12.5 MG capsule TAKE 1 CAPSULE BY MOUTH EVERY DAY 90 capsule 1  . Mirabegron (MYRBETRIQ PO) Take 25 mg by mouth daily.    . nitroGLYCERIN (NITROSTAT)  0.4 MG SL tablet DISSOLVE 1 TABLET UNDER THE TONGUE AT FIRST SIGN OF ATTACKS, MAY REPEAT EVERY 5 MINUTES UP TO 3 TABLETS, IF NO RELLIEF SEEK MEDICAL HELP 25 tablet 1  . omega-3 acid ethyl esters (LOVAZA) 1 g capsule TAKE 2 CAPSULES BY MOUTH TWICE DAILY 360 capsule 3  . losartan (COZAAR) 100 MG tablet Take 100 mg by mouth daily.    . solifenacin (VESICARE) 10 MG tablet Take 10 mg by mouth daily.     No current facility-administered medications on file prior to visit.    Cardiovascular studies:  Echocardiogram 12/20/2018:  Normal LV systolic function with EF 56%. Left ventricle cavity is normal  in size. Moderate concentric hypertrophy of the left ventricle. Normal  global wall motion. Doppler evidence of grade I (impaired) diastolic  dysfunction, normal LAP. Calculated EF 56%.  Left atrial cavity is mild to moderately dilated by volume.  Right atrial cavity is mild to moderately dilated.  Mild calcification of the mitral valve annulus. Mild to moderate (Grade  II) mitral regurgitation.  Structurally normal tricuspid valve. Mild tricuspid regurgitation. Mild to  moderate pulmonary hypertension. Estimated pulmonary artery systolic  pressure is 42 mm Hg with RA pressure estimated at 8 mm Hg. RVSP measures  42 mmHg.  IVC is normal in size with a respiratory response of <50%. May suggest  elevated central venous pressure.  [place] Stress Test 12/20/2018: 1.  Resting EKG demonstrates frequent PACs in bigeminal pattern,  stress EKG nondiagnostic due to pharmacologic stress.  There are frequent PACs and occasional atrial couplets.  Stress symptoms included dizziness and abdominal discomfort. 2.  Perfusion images reveal soft tissue attenuation artifact in the inferior wall without demonstrable ischemia or infarct.  Polar plot images do not reveal significant difference in counts.  Dynamic gated images reveal normal LVEF at 53% without wall motion abnormality. This is a low risk study.   Compared to the study done on 06/18/2012, soft tissue artifact was present, EF was 41% without ischemia previously.  EKG 11/25/2018: Sinus rhythm 66 bpm. Normal EKG.  Cardiac catheterization 2006:  Normal coronary arteries except for mild 10-20% focal stenosis in the LAD with normal left ventricle systolic function, 2+ mitral regurgitation.   Recent labs: 01/06/2019: Glucose 108, BUN/Cr 44/1.86. EGFR 26. Na/K 141/5.0. Rest of the CMP normal H/H 11/33. MCV 97. Platelets 253 HbA1C 5.0% Chol 141, TG 118, HDL 49, LDL 71 TSH 2.0 normal    Review of Systems  Cardiovascular: Negative for chest pain, dyspnea on exertion, leg swelling, palpitations and syncope.         Vitals:   04/25/19 1554  BP: (!) 142/64  Pulse: 65  Temp: (!) 94.4 F (34.7 C)  SpO2: 100%     Body mass index is 49.1 kg/m. Filed Weights   04/25/19 1554  Weight: 219 lb (99.3 kg)     Objective:   Physical Exam  Constitutional: She appears well-developed and well-nourished.  Neck: No JVD present.  Cardiovascular: Normal rate, regular rhythm, normal heart sounds and intact distal pulses.  No murmur heard. Pulmonary/Chest: Effort normal and breath sounds normal. She has no wheezes. She has no rales.  Musculoskeletal:        General: No edema.  Nursing note and vitals reviewed.         Assessment & Recommendations:   79 y.o. African American female with hypertension, hyperlipidemia, type 2 DM, mild nonobstructive coronary artery disease (cath 2006), monoclonal gammopathy, morbid obesity  Chest pain: No recurrence. No high risk findings on stress test. Known mild CAD on cath 2006. Conitnue Aspirin, statin.  Hypertension: Fairly well controlled.  Continue current medical therapy.  Diabetes: Continue management as per PCP.   Thank you for referring the patient to [place]. Please feel free to contact with any questions.  Nigel Mormon, MD [facility] Pager:  [phone] Office: [phone] If no answer Cell [phone]

## 2019-05-17 DIAGNOSIS — E1122 Type 2 diabetes mellitus with diabetic chronic kidney disease: Secondary | ICD-10-CM | POA: Diagnosis not present

## 2019-05-17 DIAGNOSIS — E119 Type 2 diabetes mellitus without complications: Secondary | ICD-10-CM | POA: Diagnosis not present

## 2019-05-17 DIAGNOSIS — N1832 Chronic kidney disease, stage 3b: Secondary | ICD-10-CM | POA: Diagnosis not present

## 2019-05-17 DIAGNOSIS — N182 Chronic kidney disease, stage 2 (mild): Secondary | ICD-10-CM | POA: Diagnosis not present

## 2019-05-17 DIAGNOSIS — I129 Hypertensive chronic kidney disease with stage 1 through stage 4 chronic kidney disease, or unspecified chronic kidney disease: Secondary | ICD-10-CM | POA: Diagnosis not present

## 2019-06-04 ENCOUNTER — Ambulatory Visit: Payer: Medicare Other | Attending: Internal Medicine

## 2019-06-04 DIAGNOSIS — Z23 Encounter for immunization: Secondary | ICD-10-CM

## 2019-06-04 NOTE — Progress Notes (Signed)
   Covid-19 Vaccination Clinic  Name:  Rita Yoder    MRN: [id number] DOB: 1941-02-01  06/04/2019  [name] was observed post Covid-19 immunization for 15 minutes without incident. She was provided with Vaccine Information Sheet and instruction to access the V-Safe system.   [name] was instructed to call 911 with any severe reactions post vaccine: Marland Kitchen Difficulty breathing  . Swelling of face and throat  . A fast heartbeat  . A bad rash all over body  . Dizziness and weakness   Immunizations Administered    Name Date Dose VIS Date Route   Pfizer COVID-19 Vaccine 06/04/2019 12:44 PM 0.3 mL 02/18/2019 Intramuscular   Manufacturer: [place]   Lot: [id number]   [place]: [id number]

## 2019-06-14 ENCOUNTER — Other Ambulatory Visit (HOSPITAL_COMMUNITY): Payer: Self-pay | Admitting: Internal Medicine

## 2019-06-14 ENCOUNTER — Encounter: Payer: Self-pay | Admitting: Internal Medicine

## 2019-06-14 ENCOUNTER — Other Ambulatory Visit: Payer: Self-pay | Admitting: Internal Medicine

## 2019-06-14 ENCOUNTER — Other Ambulatory Visit: Payer: Self-pay

## 2019-06-14 ENCOUNTER — Ambulatory Visit (INDEPENDENT_AMBULATORY_CARE_PROVIDER_SITE_OTHER): Payer: Medicare Other | Admitting: Internal Medicine

## 2019-06-14 VITALS — BP 132/84 | HR 60 | Temp 98.1°F | Ht <= 58 in | Wt 217.4 lb

## 2019-06-14 DIAGNOSIS — Z79899 Other long term (current) drug therapy: Secondary | ICD-10-CM

## 2019-06-14 DIAGNOSIS — I129 Hypertensive chronic kidney disease with stage 1 through stage 4 chronic kidney disease, or unspecified chronic kidney disease: Secondary | ICD-10-CM | POA: Diagnosis not present

## 2019-06-14 DIAGNOSIS — R1319 Other dysphagia: Secondary | ICD-10-CM | POA: Diagnosis not present

## 2019-06-14 DIAGNOSIS — R233 Spontaneous ecchymoses: Secondary | ICD-10-CM | POA: Diagnosis not present

## 2019-06-14 DIAGNOSIS — Z872 Personal history of diseases of the skin and subcutaneous tissue: Secondary | ICD-10-CM

## 2019-06-14 DIAGNOSIS — R1310 Dysphagia, unspecified: Secondary | ICD-10-CM

## 2019-06-14 DIAGNOSIS — Z6841 Body Mass Index (BMI) 40.0 and over, adult: Secondary | ICD-10-CM

## 2019-06-14 NOTE — Progress Notes (Signed)
This visit occurred during the SARS-CoV-2 public health emergency.  Safety protocols were in place, including screening questions prior to the visit, additional usage of staff PPE, and extensive cleaning of exam room while observing appropriate contact time as indicated for disinfecting solutions.  Subjective:     Patient ID: Rita Yoder , female    DOB: November 27, 1940 , 79 y.o.   MRN: [id number]   Chief Complaint  Patient presents with  . Referral    ENT, bruise on (R) leg    HPI  She is brought in today by her daughter for further evaluation of difficulty swallowing. She reports that she has most difficulty with eating meats. She denies having any issues chewing, she does have dentures. Her sx started about 2 months ago. She denies having any n/v associated with food intake. She admits that it feels as if food sometimes gets stuck in her throat. She denies having any issues with swallowing liquids. She will drink Ensure on occasions.    Past Medical History:  Diagnosis Date  . Chronic kidney disease 12/2009  . Diabetes mellitus 2010  . Dyslipidemia   . Hypertension   . Monoclonal gammopathy 03/2010     Family History  Problem Relation Age of Onset  . Healthy Mother   . Heart attack Brother   . Cancer Maternal Aunt   . Cancer Maternal Uncle   . Heart Problems Maternal Grandmother      Current Outpatient Medications:  .  acetaminophen (TYLENOL) 500 MG tablet, Take 500 mg by mouth every 6 (six) hours as needed., Disp: , Rfl:  .  aspirin 81 MG tablet, Take 81 mg by mouth daily.  , Disp: , Rfl:  .  atorvastatin (LIPITOR) 20 MG tablet, Take 1 tablet (20 mg total) by mouth daily., Disp: 90 tablet, Rfl: 1 .  cholecalciferol (VITAMIN D) 400 UNITS TABS, Take 2,000 Units by mouth daily.  , Disp: , Rfl:  .  hydrochlorothiazide (MICROZIDE) 12.5 MG capsule, TAKE 1 CAPSULE BY MOUTH EVERY DAY, Disp: 90 capsule, Rfl: 1 .  losartan (COZAAR) 100 MG tablet, Take 100 mg by mouth daily.,  Disp: , Rfl:  .  Mirabegron (MYRBETRIQ PO), Take 25 mg by mouth daily., Disp: , Rfl:  .  nitroGLYCERIN (NITROSTAT) 0.4 MG SL tablet, DISSOLVE 1 TABLET UNDER THE TONGUE AT FIRST SIGN OF ATTACKS, MAY REPEAT EVERY 5 MINUTES UP TO 3 TABLETS, IF NO RELLIEF SEEK MEDICAL HELP, Disp: 25 tablet, Rfl: 1 .  omega-3 acid ethyl esters (LOVAZA) 1 g capsule, TAKE 2 CAPSULES BY MOUTH TWICE DAILY, Disp: 360 capsule, Rfl: 3 .  solifenacin (VESICARE) 10 MG tablet, Take 10 mg by mouth daily., Disp: , Rfl:  .  Blood Glucose Calibration (OT ULTRA/FASTTK CNTRL SOLN) SOLN, , Disp: , Rfl:    No Known Allergies   Review of Systems  Constitutional: Negative.   Respiratory: Negative.   Cardiovascular: Negative.   Gastrointestinal: Negative.   Skin:       She c/o bruising on her RLE. She does not recall having any trauma to this area. She is not having any pain in this area at this time.   Neurological: Negative.   Psychiatric/Behavioral: Negative.      Today's Vitals   06/14/19 1129  BP: 132/84  Pulse: 60  Temp: 98.1 F (36.7 C)  TempSrc: Oral  SpO2: 94%  Weight: 217 lb 6.4 oz (98.6 kg)  Height: 4\' 8"  (1.422 m)   Body mass index is 48.74 kg/m.  Wt Readings from Last 3 Encounters:  06/14/19 217 lb 6.4 oz (98.6 kg)  04/25/19 219 lb (99.3 kg)  02/07/19 226 lb 3.2 oz (102.6 kg)     Objective:  Physical Exam Vitals and nursing note reviewed.  Constitutional:      Appearance: Normal appearance.  HENT:     Head: Normocephalic and atraumatic.  Cardiovascular:     Rate and Rhythm: Normal rate and regular rhythm.     Heart sounds: Normal heart sounds.  Pulmonary:     Effort: Pulmonary effort is normal.     Breath sounds: Normal breath sounds.  Skin:    General: Skin is warm.     Comments: No bruising noted on RLE  Neurological:     General: No focal deficit present.     Mental Status: She is alert.  Psychiatric:        Mood and Affect: Mood normal.        Behavior: Behavior normal.          Assessment And Plan:     1. Other dysphagia  She agrees to barium swallow. I will make further recommendations once these results are available.   2. Ecchymoses, spontaneous  Resolved. I will check labwork as listed below.   - CBC with Diff  3. Parenchymal renal hypertension, stage 1 through stage 4 or unspecified chronic kidney disease  Chronic, fair control. She will continue with current meds. She reports seeing nephrologist. I will request these records for review.   4. Class 3 severe obesity due to excess calories with serious comorbidity and body mass index (BMI) of 45.0 to 49.9 in adult (HCC)  BMI 48. She is encouraged to perform chair exercises while watching TV.  Also encouraged to increase her daily activity.   5. Drug therapy  - Vitamin B12     Rita Greenland, MD    THE PATIENT IS ENCOURAGED TO PRACTICE SOCIAL DISTANCING DUE TO THE COVID-19 PANDEMIC.

## 2019-06-15 LAB — CBC WITH DIFFERENTIAL/PLATELET
Basophils Absolute: 0.1 10*3/uL (ref 0.0–0.2)
Basos: 1 %
EOS (ABSOLUTE): 0.2 10*3/uL (ref 0.0–0.4)
Eos: 2 %
Hematocrit: 36.8 % (ref 34.0–46.6)
Hemoglobin: 11.7 g/dL (ref 11.1–15.9)
Immature Grans (Abs): 0 10*3/uL (ref 0.0–0.1)
Immature Granulocytes: 1 %
Lymphocytes Absolute: 2.3 10*3/uL (ref 0.7–3.1)
Lymphs: 31 %
MCH: 31.8 pg (ref 26.6–33.0)
MCHC: 31.8 g/dL (ref 31.5–35.7)
MCV: 100 fL — ABNORMAL HIGH (ref 79–97)
Monocytes Absolute: 0.4 10*3/uL (ref 0.1–0.9)
Monocytes: 6 %
Neutrophils Absolute: 4.6 10*3/uL (ref 1.4–7.0)
Neutrophils: 59 %
Platelets: 221 10*3/uL (ref 150–450)
RBC: 3.68 x10E6/uL — ABNORMAL LOW (ref 3.77–5.28)
RDW: 11.2 % — ABNORMAL LOW (ref 11.7–15.4)
WBC: 7.6 10*3/uL (ref 3.4–10.8)

## 2019-06-15 LAB — VITAMIN B12: Vitamin B-12: 340 pg/mL (ref 232–1245)

## 2019-06-28 ENCOUNTER — Ambulatory Visit: Payer: Medicare Other | Attending: Internal Medicine

## 2019-06-28 DIAGNOSIS — Z23 Encounter for immunization: Secondary | ICD-10-CM

## 2019-06-28 NOTE — Progress Notes (Signed)
   Covid-19 Vaccination Clinic  Name:  Rita Yoder    MRN: [id number] DOB: May 31, 1940  06/28/2019  [name] was observed post Covid-19 immunization for 15 minutes   without incident. She was provided with Vaccine Information Sheet and instruction to access the V-Safe system.   [name] was instructed to call 911 with any severe reactions post vaccine: Marland Kitchen Difficulty breathing  . Swelling of face and throat  . A fast heartbeat  . A bad rash all over body  . Dizziness and weakness   Immunizations Administered    Name Date Dose VIS Date Route   Pfizer COVID-19 Vaccine 06/28/2019 11:30 AM 0.3 mL 05/04/2018 Intramuscular   Manufacturer: [place]   Lot: [id number]   [place]: [id number]

## 2019-06-29 ENCOUNTER — Other Ambulatory Visit: Payer: Self-pay

## 2019-06-29 ENCOUNTER — Ambulatory Visit (HOSPITAL_COMMUNITY)
Admission: RE | Admit: 2019-06-29 | Discharge: 2019-06-29 | Disposition: A | Discharge status: Home / Self Care | Payer: Medicare Other | Source: Ambulatory Visit | Attending: Internal Medicine | Admitting: Internal Medicine

## 2019-06-29 DIAGNOSIS — K224 Dyskinesia of esophagus: Secondary | ICD-10-CM | POA: Diagnosis not present

## 2019-06-29 DIAGNOSIS — R1310 Dysphagia, unspecified: Secondary | ICD-10-CM | POA: Diagnosis not present

## 2019-06-29 NOTE — Progress Notes (Signed)
Barium swallow results: she has severe esophageal motility disorder. I would like to refer her to GI for further evaluation. If she agrees, please refer her to Dr. Collene Mares. (Does she recall who did her colonoscopy)

## 2019-07-01 ENCOUNTER — Other Ambulatory Visit: Payer: Self-pay

## 2019-07-01 DIAGNOSIS — R1319 Other dysphagia: Secondary | ICD-10-CM

## 2019-07-12 DIAGNOSIS — R1310 Dysphagia, unspecified: Secondary | ICD-10-CM | POA: Diagnosis not present

## 2019-07-12 DIAGNOSIS — R933 Abnormal findings on diagnostic imaging of other parts of digestive tract: Secondary | ICD-10-CM | POA: Diagnosis not present

## 2019-07-12 DIAGNOSIS — K5730 Diverticulosis of large intestine without perforation or abscess without bleeding: Secondary | ICD-10-CM | POA: Diagnosis not present

## 2019-08-05 DIAGNOSIS — M25552 Pain in left hip: Secondary | ICD-10-CM | POA: Diagnosis not present

## 2019-08-09 ENCOUNTER — Ambulatory Visit (INDEPENDENT_AMBULATORY_CARE_PROVIDER_SITE_OTHER): Payer: Medicare Other | Admitting: Internal Medicine

## 2019-08-09 ENCOUNTER — Other Ambulatory Visit: Payer: Self-pay

## 2019-08-09 ENCOUNTER — Encounter: Payer: Self-pay | Admitting: Internal Medicine

## 2019-08-09 VITALS — BP 126/74 | HR 60 | Temp 98.0°F | Ht <= 58 in | Wt 221.8 lb

## 2019-08-09 DIAGNOSIS — K224 Dyskinesia of esophagus: Secondary | ICD-10-CM | POA: Diagnosis not present

## 2019-08-09 DIAGNOSIS — I129 Hypertensive chronic kidney disease with stage 1 through stage 4 chronic kidney disease, or unspecified chronic kidney disease: Secondary | ICD-10-CM

## 2019-08-09 DIAGNOSIS — D519 Vitamin B12 deficiency anemia, unspecified: Secondary | ICD-10-CM | POA: Diagnosis not present

## 2019-08-09 DIAGNOSIS — M25552 Pain in left hip: Secondary | ICD-10-CM

## 2019-08-09 DIAGNOSIS — Z6841 Body Mass Index (BMI) 40.0 and over, adult: Secondary | ICD-10-CM

## 2019-08-09 MED ORDER — LEVOTHYROXINE SODIUM 25 MCG PO TABS: 25.0000 ug | ORAL_TABLET | Freq: Every morning | ORAL | 0 refills | Status: DC

## 2019-08-09 MED ORDER — LOSARTAN POTASSIUM 100 MG PO TABS: 100.0000 mg | ORAL_TABLET | Freq: Every day | ORAL | 1 refills | Status: DC

## 2019-08-09 MED ORDER — CYANOCOBALAMIN 1000 MCG/ML IJ SOLN
1000.0000 ug | Freq: Once | INTRAMUSCULAR | Status: AC
  Administered 2019-08-09: 1000 ug via INTRAMUSCULAR

## 2019-08-09 NOTE — Patient Instructions (Signed)
Vitamin B12 Deficiency Vitamin B12 deficiency means that your body does not have enough vitamin B12. The body needs this vitamin:  To make red blood cells.  To make genes (DNA).  To help the nerves work. If you do not have enough vitamin B12 in your body, you can have health problems. What are the causes?  Not eating enough foods that contain vitamin B12.  Not being able to absorb vitamin B12 from the food that you eat.  Certain digestive system diseases.  A condition in which the body does not make enough of a certain protein, which results in too few red blood cells (pernicious anemia).  Having a surgery in which part of the stomach or small intestine is removed.  Taking medicines that make it hard for the body to absorb vitamin B12. These medicines include: ? Heartburn medicines. ? Some antibiotic medicines. ? Other medicines that are used to treat certain conditions. What increases the risk?  Being older than age 50.  Eating a vegetarian or vegan diet, especially while you are pregnant.  Eating a poor diet while you are pregnant.  Taking certain medicines.  Having alcoholism. What are the signs or symptoms? In some cases, there are no symptoms. If the condition leads to too few blood cells or nerve damage, symptoms can occur, such as:  Feeling weak.  Feeling tired (fatigued).  Not being hungry.  Weight loss.  A loss of feeling (numbness) or tingling in your hands and feet.  Redness and burning of the tongue.  Being mixed up (confused) or having memory problems.  Sadness (depression).  Problems with your senses. This can include color blindness, ringing in the ears, or loss of taste.  Watery poop (diarrhea) or trouble pooping (constipation).  Trouble walking. If anemia is very bad, symptoms can include:  Being short of breath.  Being dizzy.  Having a very fast heartbeat. How is this treated?  Changing the way you eat and drink, such  as: ? Eating more foods that contain vitamin B12. ? Drinking little or no alcohol.  Getting vitamin B12 shots.  Taking vitamin B12 supplements. Your doctor will tell you the dose that is best for you. Follow these instructions at home: Eating and drinking   Eat lots of healthy foods that contain vitamin B12. These include: ? Meats and poultry, such as beef, pork, chicken, turkey, and organ meats, such as liver. ? Seafood, such as clams, rainbow trout, salmon, tuna, and haddock. ? Eggs. ? Cereal and dairy products that have vitamin B12 added to them. Check the label. The items listed above may not be a complete list of what you can eat and drink. Contact a dietitian for more options. General instructions  Get any shots as told by your doctor.  Take supplements only as told by your doctor.  Do not drink alcohol if your doctor tells you not to. In some cases, you may only be asked to limit alcohol use.  Keep all follow-up visits as told by your doctor. This is important. Contact a doctor if:  Your symptoms come back. Get help right away if:  You have trouble breathing.  You have a very fast heartbeat.  You have chest pain.  You get dizzy.  You pass out. Summary  Vitamin B12 deficiency means that your body is not getting enough vitamin B12.  In some cases, there are no symptoms of this condition.  Treatment may include making a change in the way you eat and drink,   getting vitamin B12 shots, or taking supplements.  Eat lots of healthy foods that contain vitamin B12. This information is not intended to replace advice given to you by your health care provider. Make sure you discuss any questions you have with your health care provider. Document Revised: 11/03/2017 Document Reviewed: 11/03/2017 Elsevier Patient Education  2020 Elsevier Inc.  

## 2019-08-09 NOTE — Progress Notes (Signed)
This visit occurred during the SARS-CoV-2 public health emergency.  Safety protocols were in place, including screening questions prior to the visit, additional usage of staff PPE, and extensive cleaning of exam room while observing appropriate contact time as indicated for disinfecting solutions.  Subjective:     Patient ID: Rita Yoder , female    DOB: 1940-07-27 , 79 y.o.   MRN: [id number]   Chief Complaint  Patient presents with  . Hypertension  . Hip Pain    left side    HPI  She presents today for BP check. She reports compliance with meds. She c/o left hip pain. She denies fall/trauma. Has been evaluated by Ortho. They have recommended that she get under 200 pounds prior to having surgery.   Hypertension This is a chronic problem. The current episode started more than 1 year ago. The problem has been gradually improving since onset. The problem is controlled. The current treatment provides moderate improvement.  Hip Pain  There was no injury mechanism. The pain is present in the left hip. The quality of the pain is described as aching. The pain is at a severity of 8/10. The pain is severe. The pain has been constant since onset. Associated symptoms include a loss of motion. She reports no foreign bodies present.     Past Medical History:  Diagnosis Date  . Chronic kidney disease 12/2009  . Diabetes mellitus 2010  . Dyslipidemia   . Hypertension   . Monoclonal gammopathy 03/2010     Family History  Problem Relation Age of Onset  . Healthy Mother   . Heart attack Brother   . Cancer Maternal Aunt   . Cancer Maternal Uncle   . Heart Problems Maternal Grandmother      Current Outpatient Medications:  .  aspirin 81 MG tablet, Take 81 mg by mouth daily.  , Disp: , Rfl:  .  atorvastatin (LIPITOR) 20 MG tablet, Take 1 tablet (20 mg total) by mouth daily., Disp: 90 tablet, Rfl: 1 .  Blood Glucose Calibration (OT ULTRA/FASTTK CNTRL SOLN) SOLN, , Disp: , Rfl:  .   cholecalciferol (VITAMIN D) 400 UNITS TABS, Take 2,000 Units by mouth daily.  , Disp: , Rfl:  .  hydrochlorothiazide (MICROZIDE) 12.5 MG capsule, TAKE 1 CAPSULE BY MOUTH EVERY DAY, Disp: 90 capsule, Rfl: 1 .  isosorbide mononitrate (IMDUR) 30 MG 24 hr tablet, Take 30 mg by mouth daily., Disp: , Rfl:  .  solifenacin (VESICARE) 10 MG tablet, Take 10 mg by mouth daily., Disp: , Rfl:  .  acetaminophen (TYLENOL) 500 MG tablet, Take 500 mg by mouth every 6 (six) hours as needed., Disp: , Rfl:  .  levothyroxine (SYNTHROID) 25 MCG tablet, Take 1 tablet (25 mcg total) by mouth every morning., Disp: 90 tablet, Rfl: 0 .  losartan (COZAAR) 100 MG tablet, Take 1 tablet (100 mg total) by mouth daily., Disp: 90 tablet, Rfl: 1 .  Mirabegron (MYRBETRIQ PO), Take 25 mg by mouth daily., Disp: , Rfl:  .  MOBIC 15 MG tablet, Take 15 mg by mouth daily as needed., Disp: , Rfl:  .  nitroGLYCERIN (NITROSTAT) 0.4 MG SL tablet, DISSOLVE 1 TABLET UNDER THE TONGUE AT FIRST SIGN OF ATTACKS, MAY REPEAT EVERY 5 MINUTES UP TO 3 TABLETS, IF NO RELLIEF SEEK MEDICAL HELP, Disp: 25 tablet, Rfl: 1 .  omega-3 acid ethyl esters (LOVAZA) 1 g capsule, TAKE 2 CAPSULES BY MOUTH TWICE DAILY, Disp: 360 capsule, Rfl: 3  Current Facility-Administered  Medications:  .  cyanocobalamin ((VITAMIN B-12)) injection 1,000 mcg, 1,000 mcg, Intramuscular, Once, Glendale Chard, MD   No Known Allergies   Review of Systems  Constitutional: Negative.   Respiratory: Negative.   Cardiovascular: Negative.   Gastrointestinal: Negative.        She is still having issues with swallowing. She has been diagnosed with esophageal motility d/o - GI told her "there is nothing more to do". This has affected her ability to eat.   Musculoskeletal: Positive for arthralgias.  Neurological: Negative.   Psychiatric/Behavioral: Negative.      Today's Vitals   08/09/19 1413  BP: 126/74  Pulse: 60  Temp: 98 F (36.7 C)  TempSrc: Other (Comment)  Weight: 221 lb 12.8  oz (100.6 kg)  Height: 4\' 8"  (1.422 m)  PainSc: 7   PainLoc: Hip   Body mass index is 49.73 kg/m.   Objective:  Physical Exam Vitals and nursing note reviewed.  Constitutional:      Appearance: Normal appearance. She is obese.  HENT:     Head: Normocephalic and atraumatic.  Cardiovascular:     Rate and Rhythm: Normal rate and regular rhythm.     Heart sounds: Normal heart sounds.  Pulmonary:     Effort: Pulmonary effort is normal.     Breath sounds: Normal breath sounds.  Skin:    General: Skin is warm.  Neurological:     General: No focal deficit present.     Mental Status: She is alert.  Psychiatric:        Mood and Affect: Mood normal.        Behavior: Behavior normal.         Assessment And Plan:     1. Parenchymal renal hypertension, stage 1 through stage 4 or unspecified chronic kidney disease  Chronic, well controlled. She will continue with current meds. She is encouraged to avoid adding salt to her foods.   2. Pain of left hip  Chronic, secondary to moderate - severe osteoarthritis. She does not wish to have rx pain meds. Encouraged to consider use of topical pain cream.   3. Esophageal motility disorder  She states this condition has greatly affected her quality of life. She is agreeable to [facility] for further evaluation and treatment options.   - Ambulatory referral to Gastroenterology  4. Anemia due to vitamin B12 deficiency, unspecified B12 deficiency type  She was given vitamin B12 IM x 1.  I will also check methylmalonic acid level. She is encouraged to comply with vitamin B12 dosing schedule.   - cyanocobalamin ((VITAMIN B-12)) injection 1,000 mcg - Methylmalonic Acid  5. Class 3 severe obesity due to excess calories with serious comorbidity and body mass index (BMI) of 45.0 to 49.9 in adult (HCC)  BMI 49. She is encouraged to strive for BMI less than 40 to decrease cardiac risk. She is encouraged to perform chair exercises. Advised to  avoid sugary beverages and processed foods as well.     [name], MD    THE PATIENT IS ENCOURAGED TO PRACTICE SOCIAL DISTANCING DUE TO THE COVID-19 PANDEMIC.

## 2019-08-12 DIAGNOSIS — M25552 Pain in left hip: Secondary | ICD-10-CM | POA: Diagnosis not present

## 2019-08-15 LAB — METHYLMALONIC ACID, SERUM: Methylmalonic Acid: 246 nmol/L (ref 0–378)

## 2019-08-24 ENCOUNTER — Other Ambulatory Visit: Payer: Self-pay

## 2019-08-24 MED ORDER — ATORVASTATIN CALCIUM 20 MG PO TABS: 20.0000 mg | ORAL_TABLET | Freq: Every day | ORAL | 1 refills | Status: DC

## 2019-08-25 DIAGNOSIS — M25551 Pain in right hip: Secondary | ICD-10-CM | POA: Diagnosis not present

## 2019-09-07 DIAGNOSIS — R350 Frequency of micturition: Secondary | ICD-10-CM | POA: Diagnosis not present

## 2019-10-03 ENCOUNTER — Other Ambulatory Visit: Payer: Self-pay

## 2019-10-03 MED ORDER — ISOSORBIDE MONONITRATE ER 30 MG PO TB24: 30.0000 mg | ORAL_TABLET | Freq: Every day | ORAL | 1 refills | Status: DC

## 2019-11-07 ENCOUNTER — Other Ambulatory Visit: Payer: Self-pay

## 2019-11-07 ENCOUNTER — Ambulatory Visit: Payer: Medicare Other | Admitting: Podiatry

## 2019-11-07 ENCOUNTER — Encounter: Payer: Self-pay | Admitting: Podiatry

## 2019-11-07 VITALS — Temp 96.6°F

## 2019-11-07 DIAGNOSIS — M722 Plantar fascial fibromatosis: Secondary | ICD-10-CM

## 2019-11-07 DIAGNOSIS — L84 Corns and callosities: Secondary | ICD-10-CM | POA: Diagnosis not present

## 2019-11-07 NOTE — Progress Notes (Signed)
Subjective:   Patient ID: Rita Yoder, female   DOB: 79 y.o.   MRN: [id number]   HPI Patient presents stating that she is developed a lot of pain on bottom of her left heel and she does not remember what might of occurred.  She does have diabetes and states that this area has been sore with ambulation.  Never smoked and does like to be active   Review of Systems  All other systems reviewed and are negative.       Objective:  Physical Exam Vitals and nursing note reviewed.  Constitutional:      Appearance: She is well-developed.  Pulmonary:     Effort: Pulmonary effort is normal.  Musculoskeletal:        General: Normal range of motion.  Skin:    General: Skin is warm.  Neurological:     Mental Status: She is alert.     Neurovascular status was found to be intact muscle strength found to be adequate range of motion within normal limits.  Patient is found to have a plantar keratotic lesion underneath the left heel that is painful when pressed and makes walking difficult and does have moderate obesity which is complicating factor and diabetes     Assessment:  Chronic lesion formation left plantar aspect left heel with risk factors     Plan:  H&P conditions reviewed and I went ahead today and I recommended debridement which was accomplished no iatrogenic bleeding along with padding and shoe gear modification.  Patient will be seen back as indicated for symptoms

## 2019-11-13 ENCOUNTER — Other Ambulatory Visit: Payer: Self-pay | Admitting: Internal Medicine

## 2019-11-16 DIAGNOSIS — N189 Chronic kidney disease, unspecified: Secondary | ICD-10-CM | POA: Diagnosis not present

## 2019-11-16 DIAGNOSIS — N182 Chronic kidney disease, stage 2 (mild): Secondary | ICD-10-CM | POA: Diagnosis not present

## 2019-11-16 DIAGNOSIS — E1122 Type 2 diabetes mellitus with diabetic chronic kidney disease: Secondary | ICD-10-CM | POA: Diagnosis not present

## 2019-11-16 DIAGNOSIS — I129 Hypertensive chronic kidney disease with stage 1 through stage 4 chronic kidney disease, or unspecified chronic kidney disease: Secondary | ICD-10-CM | POA: Diagnosis not present

## 2019-11-23 ENCOUNTER — Other Ambulatory Visit (HOSPITAL_COMMUNITY): Payer: Self-pay | Admitting: *Deleted

## 2019-11-24 ENCOUNTER — Other Ambulatory Visit: Payer: Self-pay

## 2019-11-24 ENCOUNTER — Ambulatory Visit (HOSPITAL_COMMUNITY)
Admission: RE | Admit: 2019-11-24 | Discharge: 2019-11-24 | Disposition: A | Discharge status: Home / Self Care | Payer: Medicare Other | Source: Ambulatory Visit | Attending: Nephrology | Admitting: Nephrology

## 2019-11-24 DIAGNOSIS — D631 Anemia in chronic kidney disease: Secondary | ICD-10-CM | POA: Diagnosis not present

## 2019-11-24 DIAGNOSIS — N1832 Chronic kidney disease, stage 3b: Secondary | ICD-10-CM | POA: Diagnosis not present

## 2019-11-24 MED ORDER — SODIUM CHLORIDE 0.9 % IV SOLN
510.0000 mg | INTRAVENOUS | Status: DC
  Administered 2019-11-24: 510 mg via INTRAVENOUS
  Filled 2019-11-24: qty 17

## 2019-11-24 NOTE — Discharge Instructions (Signed)

## 2019-11-30 ENCOUNTER — Encounter: Payer: Self-pay | Admitting: Internal Medicine

## 2019-11-30 ENCOUNTER — Ambulatory Visit (INDEPENDENT_AMBULATORY_CARE_PROVIDER_SITE_OTHER): Payer: Medicare Other | Admitting: Internal Medicine

## 2019-11-30 ENCOUNTER — Other Ambulatory Visit: Payer: Self-pay

## 2019-11-30 ENCOUNTER — Ambulatory Visit (INDEPENDENT_AMBULATORY_CARE_PROVIDER_SITE_OTHER): Payer: Medicare Other

## 2019-11-30 VITALS — BP 156/90 | HR 64 | Temp 98.1°F | Ht <= 58 in | Wt 225.0 lb

## 2019-11-30 DIAGNOSIS — E039 Hypothyroidism, unspecified: Secondary | ICD-10-CM | POA: Diagnosis not present

## 2019-11-30 DIAGNOSIS — E1142 Type 2 diabetes mellitus with diabetic polyneuropathy: Secondary | ICD-10-CM | POA: Diagnosis not present

## 2019-11-30 DIAGNOSIS — R2689 Other abnormalities of gait and mobility: Secondary | ICD-10-CM

## 2019-11-30 DIAGNOSIS — I129 Hypertensive chronic kidney disease with stage 1 through stage 4 chronic kidney disease, or unspecified chronic kidney disease: Secondary | ICD-10-CM | POA: Diagnosis not present

## 2019-11-30 DIAGNOSIS — R1030 Lower abdominal pain, unspecified: Secondary | ICD-10-CM | POA: Diagnosis not present

## 2019-11-30 DIAGNOSIS — Z0000 Encounter for general adult medical examination without abnormal findings: Secondary | ICD-10-CM

## 2019-11-30 DIAGNOSIS — Z6841 Body Mass Index (BMI) 40.0 and over, adult: Secondary | ICD-10-CM

## 2019-11-30 NOTE — Progress Notes (Signed)
I,[name],acting as a Education administrator for [name], MD.,have documented all relevant documentation on the behalf of [name], MD,as directed by  [name], MD while in the presence of [name], MD.  This visit occurred during the SARS-CoV-2 public health emergency.  Safety protocols were in place, including screening questions prior to the visit, additional usage of staff PPE, and extensive cleaning of exam room while observing appropriate contact time as indicated for disinfecting solutions.  Subjective:     Patient ID: Rita Yoder , female    DOB: 04-15-1940 , 79 y.o.   MRN: [id number]   Chief Complaint  Patient presents with  . Diabetes  . Hypertension    HPI  She presents today for diabetes and BP check.  She has been able to control her diabetes without medication. She reports compliance with BP meds. Denies headaches, chest pain and shortness of breath. She is accompanied by her daughter today - who states that patient's appetite has decreased Pt states this is due to abdominal discomfort.   Diabetes She presents for her follow-up diabetic visit. She has type 2 diabetes mellitus. Her disease course has been stable. There are no hypoglycemic associated symptoms. There are no diabetic associated symptoms. Diabetic complications include nephropathy. Risk factors for coronary artery disease include post-menopausal, sedentary lifestyle and hypertension. Current diabetic treatment includes diet.  Hypertension This is a chronic problem. The current episode started more than 1 year ago. The problem has been gradually improving since onset. The problem is controlled. The current treatment provides moderate improvement. Hypertensive end-organ damage includes kidney disease.     Past Medical History:  Diagnosis Date  . Chronic kidney disease 12/2009  . Diabetes mellitus 2010  . Dyslipidemia   . Hypertension   . Monoclonal gammopathy 03/2010     Family  History  Problem Relation Age of Onset  . Healthy Mother   . Heart attack Brother   . Cancer Maternal Aunt   . Cancer Maternal Uncle   . Heart Problems Maternal Grandmother      Current Outpatient Medications:  .  acetaminophen (TYLENOL) 500 MG tablet, Take 500 mg by mouth every 6 (six) hours as needed., Disp: , Rfl:  .  aspirin 81 MG tablet, Take 81 mg by mouth daily.  , Disp: , Rfl:  .  atorvastatin (LIPITOR) 20 MG tablet, Take 1 tablet (20 mg total) by mouth daily., Disp: 90 tablet, Rfl: 1 .  Blood Glucose Calibration (OT ULTRA/FASTTK CNTRL SOLN) SOLN, , Disp: , Rfl:  .  cholecalciferol (VITAMIN D) 400 UNITS TABS, Take 2,000 Units by mouth daily.  , Disp: , Rfl:  .  hydrochlorothiazide (MICROZIDE) 12.5 MG capsule, TAKE 1 CAPSULE BY MOUTH EVERY DAY, Disp: 90 capsule, Rfl: 1 .  isosorbide mononitrate (IMDUR) 30 MG 24 hr tablet, Take 1 tablet (30 mg total) by mouth daily., Disp: 90 tablet, Rfl: 1 .  levothyroxine (SYNTHROID) 25 MCG tablet, TAKE 1 TABLET(25 MCG) BY MOUTH EVERY MORNING, Disp: 90 tablet, Rfl: 0 .  losartan (COZAAR) 100 MG tablet, Take 1 tablet (100 mg total) by mouth daily., Disp: 90 tablet, Rfl: 1 .  Mirabegron (MYRBETRIQ PO), Take 25 mg by mouth daily., Disp: , Rfl:  .  MOBIC 15 MG tablet, Take 15 mg by mouth daily as needed., Disp: , Rfl:  .  nitroGLYCERIN (NITROSTAT) 0.4 MG SL tablet, DISSOLVE 1 TABLET UNDER THE TONGUE AT FIRST SIGN OF ATTACKS, MAY REPEAT EVERY 5 MINUTES UP  TO 3 TABLETS, IF NO RELLIEF SEEK MEDICAL HELP, Disp: 25 tablet, Rfl: 1 .  solifenacin (VESICARE) 10 MG tablet, Take 10 mg by mouth daily., Disp: , Rfl:  .  omega-3 acid ethyl esters (LOVAZA) 1 g capsule, TAKE 2 CAPSULES BY MOUTH TWICE DAILY (Patient not taking: Reported on 11/30/2019), Disp: 360 capsule, Rfl: 3   No Known Allergies   Review of Systems  Constitutional: Negative.   Respiratory: Negative.   Cardiovascular: Negative.   Gastrointestinal: Positive for abdominal pain.  Musculoskeletal:  Positive for arthralgias (about a few months.).  Psychiatric/Behavioral: Negative.   All other systems reviewed and are negative.    Today's Vitals   11/30/19 1411  BP: (!) 156/90  Pulse: 64  Temp: 98.1 F (36.7 C)  TempSrc: Oral  Weight: 225 lb (102.1 kg)  Height: _0  (1.422 m)  PainSc: 8   PainLoc: Abdomen   Body mass index is 50.44 kg/m.  Wt Readings from Last 3 Encounters:  11/30/19 225 lb (102.1 kg)  11/30/19 225 lb (102.1 kg)  08/09/19 221 lb 12.8 oz (100.6 kg)   Objective:  Physical Exam Vitals and nursing note reviewed.  Constitutional:      Appearance: Normal appearance. She is obese.  HENT:     Head: Normocephalic and atraumatic.  Cardiovascular:     Rate and Rhythm: Normal rate and regular rhythm.     Heart sounds: Normal heart sounds.  Pulmonary:     Breath sounds: Normal breath sounds.  Abdominal:     General: Bowel sounds are normal.     Palpations: Abdomen is soft.     Tenderness: There is abdominal tenderness.     Comments: Obese, difficult to assess organomegaly. Examined while in chair and clothed.   Skin:    General: Skin is warm.  Neurological:     General: No focal deficit present.     Mental Status: She is alert and oriented to person, place, and time.         Assessment And Plan:     1. Diabetic polyneuropathy associated with type 2 diabetes mellitus ([place]) Comments: Chronic, controlled with diet - not on any meds.  - Hemoglobin A1c - Lipid panel - CMP14+EGFR  2. Parenchymal renal hypertension, stage 1 through stage 4 or unspecified chronic kidney disease Comments: Chronic, uncontrolled. She will continue with current meds for now. Importance of compliance was discussed with the patient.   3. Lower abdominal pain Comments: I will schedule her for CT abdomen/pelvis without contrast - given her renal function. She is in agreement with her treatment plan.   4. Primary hypothyroidism Comments: I will check thyroid panel and adjust  meds as needed.  - TSH - T4, Free  5. Balance problems Comments: I will check vitamin B12 levels.  - Vitamin B12  6. Class 3 severe obesity due to excess calories with serious comorbidity and body mass index (BMI) of 50.0 to 59.9 in adult [facility]) Comments: BMI 50- Encouraged to strive for BMI less than 45 to decrease cardiac risk. Encouraged to perform chair exercises while watching TV.   She is encouraged to strive for BMI less than 30 to decrease cardiac risk. Advised to aim for at least 150 minutes of exercise per week.   Patient was given opportunity to ask questions. Patient verbalized understanding of the plan and was able to repeat key elements of the plan. All questions were answered to their satisfaction.  [name], MD   I, Theda Belfast  Baird Cancer, MD, have reviewed all documentation for this visit. The documentation on 12/05/19 for the exam, diagnosis, procedures, and orders are all accurate and complete.  THE PATIENT IS ENCOURAGED TO PRACTICE SOCIAL DISTANCING DUE TO THE COVID-19 PANDEMIC.

## 2019-11-30 NOTE — Patient Instructions (Signed)
Rita Yoder , Thank you for taking time to come for your Medicare Wellness Visit. I appreciate your ongoing commitment to your health goals. Please review the following plan we discussed and let me know if I can assist you in the future.   Screening recommendations/referrals: Colonoscopy: not required Mammogram: completed 04/13/2019 Bone Density: completed 02/23/2017 Recommended yearly ophthalmology/optometry visit for glaucoma screening and checkup Recommended yearly dental visit for hygiene and checkup  Vaccinations: Influenza vaccine: decline Pneumococcal vaccine: decline Tdap vaccine: completed 11/10/2012 Shingles vaccine: discussed   Covid-19: 06/28/2019, 06/04/2019  Advanced directives: Please bring a copy of your POA (Power of Attorney) and/or Living Will to your next appointment.   Conditions/risks identified: abdominal pain with eating (updated PCP)  Next appointment: 02/14/2020 at 2:45 Follow up in one year for your annual wellness visit    Preventive Care 65 Years and Older, Female Preventive care refers to lifestyle choices and visits with your health care provider that can promote health and wellness. What does preventive care include?  A yearly physical exam. This is also called an annual well check.  Dental exams once or twice a year.  Routine eye exams. Ask your health care provider how often you should have your eyes checked.  Personal lifestyle choices, including:  Daily care of your teeth and gums.  Regular physical activity.  Eating a healthy diet.  Avoiding tobacco and drug use.  Limiting alcohol use.  Practicing safe sex.  Taking low-dose aspirin every day.  Taking vitamin and mineral supplements as recommended by your health care provider. What happens during an annual well check? The services and screenings done by your health care provider during your annual well check will depend on your age, overall health, lifestyle risk factors, and family  history of disease. Counseling  Your health care provider may ask you questions about your:  Alcohol use.  Tobacco use.  Drug use.  Emotional well-being.  Home and relationship well-being.  Sexual activity.  Eating habits.  History of falls.  Memory and ability to understand (cognition).  Work and work Statistician.  Reproductive health. Screening  You may have the following tests or measurements:  Height, weight, and BMI.  Blood pressure.  Lipid and cholesterol levels. These may be checked every 5 years, or more frequently if you are over 18 years old.  Skin check.  Lung cancer screening. You may have this screening every year starting at age 59 if you have a 30-pack-year history of smoking and currently smoke or have quit within the past 15 years.  Fecal occult blood test (FOBT) of the stool. You may have this test every year starting at age 49.  Flexible sigmoidoscopy or colonoscopy. You may have a sigmoidoscopy every 5 years or a colonoscopy every 10 years starting at age 47.  Hepatitis C blood test.  Hepatitis B blood test.  Sexually transmitted disease (STD) testing.  Diabetes screening. This is done by checking your blood sugar (glucose) after you have not eaten for a while (fasting). You may have this done every 1-3 years.  Bone density scan. This is done to screen for osteoporosis. You may have this done starting at age 78.  Mammogram. This may be done every 1-2 years. Talk to your health care provider about how often you should have regular mammograms. Talk with your health care provider about your test results, treatment options, and if necessary, the need for more tests. Vaccines  Your health care provider may recommend certain vaccines, such as:  Influenza vaccine. This is recommended every year.  Tetanus, diphtheria, and acellular pertussis (Tdap, Td) vaccine. You may need a Td booster every 10 years.  Zoster vaccine. You may need this after  age 76.  Pneumococcal 13-valent conjugate (PCV13) vaccine. One dose is recommended after age 1.  Pneumococcal polysaccharide (PPSV23) vaccine. One dose is recommended after age 64. Talk to your health care provider about which screenings and vaccines you need and how often you need them. This information is not intended to replace advice given to you by your health care provider. Make sure you discuss any questions you have with your health care provider. Document Released: 03/23/2015 Document Revised: 11/14/2015 Document Reviewed: 12/26/2014 Elsevier Interactive Patient Education  2017 [place] Prevention in the Home Falls can cause injuries. They can happen to people of all ages. There are many things you can do to make your home safe and to help prevent falls. What can I do on the outside of my home?  Regularly fix the edges of walkways and driveways and fix any cracks.  Remove anything that might make you trip as you walk through a door, such as a raised step or threshold.  Trim any bushes or trees on the path to your home.  Use bright outdoor lighting.  Clear any walking paths of anything that might make someone trip, such as rocks or tools.  Regularly check to see if handrails are loose or broken. Make sure that both sides of any steps have handrails.  Any raised decks and porches should have guardrails on the edges.  Have any leaves, snow, or ice cleared regularly.  Use sand or salt on walking paths during winter.  Clean up any spills in your garage right away. This includes oil or grease spills. What can I do in the bathroom?  Use night lights.  Install grab bars by the toilet and in the tub and shower. Do not use towel bars as grab bars.  Use non-skid mats or decals in the tub or shower.  If you need to sit down in the shower, use a plastic, non-slip stool.  Keep the floor dry. Clean up any water that spills on the floor as soon as it  happens.  Remove soap buildup in the tub or shower regularly.  Attach bath mats securely with double-sided non-slip rug tape.  Do not have throw rugs and other things on the floor that can make you trip. What can I do in the bedroom?  Use night lights.  Make sure that you have a light by your bed that is easy to reach.  Do not use any sheets or blankets that are too big for your bed. They should not hang down onto the floor.  Have a firm chair that has side arms. You can use this for support while you get dressed.  Do not have throw rugs and other things on the floor that can make you trip. What can I do in the kitchen?  Clean up any spills right away.  Avoid walking on wet floors.  Keep items that you use a lot in easy-to-reach places.  If you need to reach something above you, use a strong step stool that has a grab bar.  Keep electrical cords out of the way.  Do not use floor polish or wax that makes floors slippery. If you must use wax, use non-skid floor wax.  Do not have throw rugs and other things on the floor that  can make you trip. What can I do with my stairs?  Do not leave any items on the stairs.  Make sure that there are handrails on both sides of the stairs and use them. Fix handrails that are broken or loose. Make sure that handrails are as long as the stairways.  Check any carpeting to make sure that it is firmly attached to the stairs. Fix any carpet that is loose or worn.  Avoid having throw rugs at the top or bottom of the stairs. If you do have throw rugs, attach them to the floor with carpet tape.  Make sure that you have a light switch at the top of the stairs and the bottom of the stairs. If you do not have them, ask someone to add them for you. What else can I do to help prevent falls?  Wear shoes that:  Do not have high heels.  Have rubber bottoms.  Are comfortable and fit you well.  Are closed at the toe. Do not wear sandals.  If you  use a stepladder:  Make sure that it is fully opened. Do not climb a closed stepladder.  Make sure that both sides of the stepladder are locked into place.  Ask someone to hold it for you, if possible.  Clearly mark and make sure that you can see:  Any grab bars or handrails.  First and last steps.  Where the edge of each step is.  Use tools that help you move around (mobility aids) if they are needed. These include:  Canes.  Walkers.  Scooters.  Crutches.  Turn on the lights when you go into a dark area. Replace any light bulbs as soon as they burn out.  Set up your furniture so you have a clear path. Avoid moving your furniture around.  If any of your floors are uneven, fix them.  If there are any pets around you, be aware of where they are.  Review your medicines with your doctor. Some medicines can make you feel dizzy. This can increase your chance of falling. Ask your doctor what other things that you can do to help prevent falls. This information is not intended to replace advice given to you by your health care provider. Make sure you discuss any questions you have with your health care provider. Document Released: 12/21/2008 Document Revised: 08/02/2015 Document Reviewed: 03/31/2014 Elsevier Interactive Patient Education  2017 [employer].

## 2019-11-30 NOTE — Progress Notes (Signed)
This visit occurred during the SARS-CoV-2 public health emergency.  Safety protocols were in place, including screening questions prior to the visit, additional usage of staff PPE, and extensive cleaning of exam room while observing appropriate contact time as indicated for disinfecting solutions.  Subjective:   Rita Yoder is a 79 y.o. female who presents for Medicare Annual (Subsequent) preventive examination.  Review of Systems     Cardiac Risk Factors include: advanced age (>92men, >87 women);diabetes mellitus;dyslipidemia;hypertension;obesity (BMI >30kg/m2);sedentary lifestyle     Objective:    Today's Vitals   11/30/19 1420 11/30/19 1421  BP: (!) 156/90   Pulse: 64   Temp: 98.1 F (36.7 C)   TempSrc: Oral   Weight: 225 lb (102.1 kg)   Height: 4\' 8"  (1.422 m)   PainSc:  8    Body mass index is 50.44 kg/m.  Advanced Directives 11/30/2019 11/18/2018 01/20/2018  Does Patient Have a Medical Advance Directive? Yes No Yes  Type of Paramedic of [place];Living will - [place] in Chart? No - copy requested - No - copy requested  Would patient like information on creating a medical advance directive? - No - Patient declined -    Current Medications (verified) Outpatient Encounter Medications as of 11/30/2019  Medication Sig  . acetaminophen (TYLENOL) 500 MG tablet Take 500 mg by mouth every 6 (six) hours as needed.  Marland Kitchen aspirin 81 MG tablet Take 81 mg by mouth daily.    Marland Kitchen atorvastatin (LIPITOR) 20 MG tablet Take 1 tablet (20 mg total) by mouth daily.  . Blood Glucose Calibration (OT ULTRA/FASTTK CNTRL SOLN) SOLN   . cholecalciferol (VITAMIN D) 400 UNITS TABS Take 2,000 Units by mouth daily.    . hydrochlorothiazide (MICROZIDE) 12.5 MG capsule TAKE 1 CAPSULE BY MOUTH EVERY DAY  . isosorbide mononitrate (IMDUR) 30 MG 24 hr tablet Take 1 tablet (30 mg total) by mouth daily.  Marland Kitchen levothyroxine  (SYNTHROID) 25 MCG tablet TAKE 1 TABLET(25 MCG) BY MOUTH EVERY MORNING  . losartan (COZAAR) 100 MG tablet Take 1 tablet (100 mg total) by mouth daily.  . Mirabegron (MYRBETRIQ PO) Take 25 mg by mouth daily.  Marland Kitchen MOBIC 15 MG tablet Take 15 mg by mouth daily as needed.  . nitroGLYCERIN (NITROSTAT) 0.4 MG SL tablet DISSOLVE 1 TABLET UNDER THE TONGUE AT FIRST SIGN OF ATTACKS, MAY REPEAT EVERY 5 MINUTES UP TO 3 TABLETS, IF NO RELLIEF SEEK MEDICAL HELP  . omega-3 acid ethyl esters (LOVAZA) 1 g capsule TAKE 2 CAPSULES BY MOUTH TWICE DAILY (Patient not taking: Reported on 11/30/2019)  . solifenacin (VESICARE) 10 MG tablet Take 10 mg by mouth daily.   No facility-administered encounter medications on file as of 11/30/2019.    Allergies (verified) Patient has no known allergies.   History: Past Medical History:  Diagnosis Date  . Chronic kidney disease 12/2009  . Diabetes mellitus 2010  . Dyslipidemia   . Hypertension   . Monoclonal gammopathy 03/2010   Past Surgical History:  Procedure Laterality Date  . CHOLECYSTECTOMY    . HERNIA REPAIR    . ROTATOR CUFF REPAIR  1994  . THYROID SURGERY    . TONSILLECTOMY    . TOTAL ABDOMINAL HYSTERECTOMY     Family History  Problem Relation Age of Onset  . Healthy Mother   . Heart attack Brother   . Cancer Maternal Aunt   . Cancer Maternal Uncle   . Heart Problems  Maternal Grandmother    Social History   Socioeconomic History  . Marital status: Married    Spouse name: Not on file  . Number of children: 4  . Years of education: Not on file  . Highest education level: Not on file  Occupational History  . Occupation: retrired  Tobacco Use  . Smoking status: Never Smoker  . Smokeless tobacco: Never Used  Vaping Use  . Vaping Use: Never used  Substance and Sexual Activity  . Alcohol use: No  . Drug use: Never  . Sexual activity: Not Currently  Other Topics Concern  . Not on file  Social History Narrative  . Not on file   Social  Determinants of Health   Financial Resource Strain: Low Risk   . Difficulty of Paying Living Expenses: Not hard at all  Food Insecurity: No Food Insecurity  . Worried About Charity fundraiser in the Last Year: Never true  . Ran Out of Food in the Last Year: Never true  Transportation Needs: No Transportation Needs  . Lack of Transportation (Medical): No  . Lack of Transportation (Non-Medical): No  Physical Activity: Inactive  . Days of Exercise per Week: 0 days  . Minutes of Exercise per Session: 0 min  Stress:   . Feeling of Stress : Not on file  Social Connections:   . Frequency of Communication with Friends and Family: Not on file  . Frequency of Social Gatherings with Friends and Family: Not on file  . Attends Religious Services: Not on file  . Active Member of Clubs or Organizations: Not on file  . Attends Archivist Meetings: Not on file  . Marital Status: Not on file    Tobacco Counseling Counseling given: Not Answered   Clinical Intake:  Pre-visit preparation completed: Yes  Pain : 0-10 Pain Score: 8  Pain Type: Chronic pain Pain Location: Abdomen Pain Descriptors / Indicators: Cramping Pain Onset: More than a month ago Pain Frequency: Intermittent     Nutritional Status: BMI > 30  Obese Nutritional Risks: None Diabetes: Yes  How often do you need to have someone help you when you read instructions, pamphlets, or other written materials from your doctor or pharmacy?: 1 - Never What is the last grade level you completed in school?: 11th grade  Diabetic? Yes Nutrition Risk Assessment:  Has the patient had any N/V/D within the last 2 months?  No  Does the patient have any non-healing wounds?  No  Has the patient had any unintentional weight loss or weight gain?  No   Diabetes:  Is the patient diabetic?  Yes  If diabetic, was a CBG obtained today?  No  Did the patient bring in their glucometer from home?  No  How often do you monitor your  CBG's? none.   Financial Strains and Diabetes Management:  Are you having any financial strains with the device, your supplies or your medication? No .  Does the patient want to be seen by Chronic Care Management for management of their diabetes?  No  Would the patient like to be referred to a Nutritionist or for Diabetic Management?  No   Diabetic Exams:  Diabetic Eye Exam: Completed 02/11/2019 Diabetic Foot Exam: Completed 02/07/2019   Interpreter Needed?: No  Information entered by :: NAllen LPN   Activities of Daily Living In your present state of health, do you have any difficulty performing the following activities: 11/30/2019 11/30/2019  Hearing? Y Y  Comment left  ear -  Vision? Y Y  Comment need some new glasses -  Difficulty concentrating or making decisions? N N  Walking or climbing stairs? N Y  Dressing or bathing? N N  Doing errands, shopping? Tempie Donning  Comment has someone with her -  Conservation officer, nature and eating ? N -  Using the Toilet? N -  In the past six months, have you accidently leaked urine? N -  Do you have problems with loss of bowel control? N -  Managing your Medications? N -  Managing your Finances? N -  Housekeeping or managing your Housekeeping? Y -  Some recent data might be hidden    Patient Care Team: Glendale Chard, MD as PCP - General (Internal Medicine) [place] [place] (Pediatric Ophthalmology) Elsie Saas, MD as Consulting Physician (Orthopedic Surgery)  Indicate any recent Medical Services you may have received from other than [facility] providers in the past year (date may be approximate).     Assessment:   This is a routine wellness examination for [place].  Hearing/Vision screen  Hearing Screening   125Hz  250Hz  500Hz  1000Hz  2000Hz  3000Hz  4000Hz  6000Hz  8000Hz   Right ear:           Left ear:           Vision Screening Comments: Regular eye exams,   Dietary issues and exercise activities discussed: Current Exercise Habits: The patient  does not participate in regular exercise at present  Goals    .  Patient Stated      11/30/2019, wants to get back to normal    .  Weight (lb) < 200 lb (90.7 kg) (pt-stated)      Needs to lose 20 pounds to have hip surgery    .  Weight (lb) < 200 lb (90.7 kg)      Wants to lose about 20 lbs      Depression Screen PHQ 2/9 Scores 11/30/2019 11/30/2019 11/18/2018 01/20/2018  PHQ - 2 Score 0 0 0 0  PHQ- 9 Score - - 0 -    Fall Risk Fall Risk  11/30/2019 01/06/2019 11/18/2018 01/20/2018  Falls in the past year? 0 0 1 1  Comment - - 3 falls -  Number falls in past yr: - - 1 1  Comment - - - just fell  Injury with Fall? - - 0 0  Risk for fall due to : Medication side effect;Impaired balance/gait - History of fall(s);Impaired balance/gait;Medication side effect History of fall(s);Medication side effect;Impaired balance/gait;Impaired mobility  Follow up Falls evaluation completed;Education provided;Falls prevention discussed - Falls evaluation completed;Falls prevention discussed -    Any stairs in or around the home? No  If so, are there any without handrails? n/a Home free of loose throw rugs in walkways, pet beds, electrical cords, etc? Yes  Adequate lighting in your home to reduce risk of falls? Yes   ASSISTIVE DEVICES UTILIZED TO PREVENT FALLS:  Life alert? No  Use of a cane, walker or w/c? No  Grab bars in the bathroom? Yes  Shower chair or bench in shower? Yes  Elevated toilet seat or a handicapped toilet? Yes   TIMED UP AND GO:  Was the test performed? No   Cognitive Function:     6CIT Screen 11/30/2019 11/18/2018 01/20/2018  What Year? 0 points 0 points 0 points  What month? 0 points 0 points 0 points  What time? 0 points 0 points 0 points  Count back from 20 2 points 0 points 0 points  Months in reverse 4 points 0 points 0 points  Repeat phrase 8 points 0 points 2 points  Total Score 14 0 2    Immunizations Immunization History  Administered Date(s) Administered   . PFIZER SARS-COV-2 Vaccination 06/04/2019, 06/28/2019    TDAP status: Up to date Flu Vaccine status: Declined, Education has been provided regarding the importance of this vaccine but patient still declined. Advised may receive this vaccine at local pharmacy or Health Dept. Aware to provide a copy of the vaccination record if obtained from local pharmacy or Health Dept. Verbalized acceptance and understanding. Pneumococcal vaccine status: Declined,  Education has been provided regarding the importance of this vaccine but patient still declined. Advised may receive this vaccine at local pharmacy or Health Dept. Aware to provide a copy of the vaccination record if obtained from local pharmacy or Health Dept. Verbalized acceptance and understanding.  Covid-19 vaccine status: Completed vaccines  Qualifies for Shingles Vaccine? Yes   Zostavax completed No   Shingrix Completed?: No.    Education has been provided regarding the importance of this vaccine. Patient has been advised to call insurance company to determine out of pocket expense if they have not yet received this vaccine. Advised may also receive vaccine at local pharmacy or Health Dept. Verbalized acceptance and understanding.  Screening Tests Health Maintenance  Topic Date Due  . HEMOGLOBIN A1C  07/07/2019  . INFLUENZA VACCINE  06/07/2020 (Originally 10/09/2019)  . Hepatitis C Screening  11/29/2020 (Originally 1941-02-24)  . PNA vac Low Risk Adult (1 of 2 - PCV13) 11/29/2020 (Originally 01/16/2005)  . FOOT EXAM  02/07/2020  . OPHTHALMOLOGY EXAM  02/11/2020  . TETANUS/TDAP  11/11/2022  . DEXA SCAN  Completed  . COVID-19 Vaccine  Completed    Health Maintenance  Health Maintenance Due  Topic Date Due  . HEMOGLOBIN A1C  07/07/2019    Colorectal cancer screening: No longer required.  Mammogram status: Completed 04/13/2019. Repeat every year Bone Density status: Completed 02/23/2017.   Lung Cancer Screening: (Low Dose CT Chest  recommended if Age 71-80 years, 30 pack-year currently smoking OR have quit w/in 15years.) does not qualify.   Lung Cancer Screening Referral: no  Additional Screening:  Hepatitis C Screening: does qualify; declines  Vision Screening: Recommended annual ophthalmology exams for early detection of glaucoma and other disorders of the eye. Is the patient up to date with their annual eye exam?  Yes  Who is the provider or what is the name of the office in which the patient attends annual eye exams? Does not remember If pt is not established with a provider, would they like to be referred to a provider to establish care? No .   Dental Screening: Recommended annual dental exams for proper oral hygiene  Community Resource Referral / Chronic Care Management: CRR required this visit?  No   CCM required this visit?  No      Plan:     I have personally reviewed and noted the following in the patient's chart:   . Medical and social history . Use of alcohol, tobacco or illicit drugs  . Current medications and supplements . Functional ability and status . Nutritional status . Physical activity . Advanced directives . List of other physicians . Hospitalizations, surgeries, and ER visits in previous 12 months . Vitals . Screenings to include cognitive, depression, and falls . Referrals and appointments  In addition, I have reviewed and discussed with patient certain preventive protocols, quality metrics, and best practice recommendations.  A written personalized care plan for preventive services as well as general preventive health recommendations were provided to patient.     Kellie Simmering, LPN   09/16/2955   Nurse Notes:

## 2019-12-01 ENCOUNTER — Ambulatory Visit (HOSPITAL_COMMUNITY)
Admission: RE | Admit: 2019-12-01 | Discharge: 2019-12-01 | Disposition: A | Discharge status: Home / Self Care | Payer: Medicare Other | Attending: Nephrology | Admitting: Nephrology

## 2019-12-01 DIAGNOSIS — D631 Anemia in chronic kidney disease: Secondary | ICD-10-CM | POA: Insufficient documentation

## 2019-12-01 DIAGNOSIS — N1832 Chronic kidney disease, stage 3b: Secondary | ICD-10-CM | POA: Diagnosis not present

## 2019-12-01 LAB — CMP14+EGFR
ALT: 21 IU/L (ref 0–32)
AST: 20 IU/L (ref 0–40)
Albumin/Globulin Ratio: 2.2 (ref 1.2–2.2)
Albumin: 4.2 g/dL (ref 3.7–4.7)
Alkaline Phosphatase: 82 IU/L (ref 44–121)
BUN/Creatinine Ratio: 18 (ref 12–28)
BUN: 47 mg/dL — ABNORMAL HIGH (ref 8–27)
Bilirubin Total: 0.3 mg/dL (ref 0.0–1.2)
CO2: 20 mmol/L (ref 20–29)
Calcium: 9.3 mg/dL (ref 8.7–10.3)
Chloride: 109 mmol/L — ABNORMAL HIGH (ref 96–106)
Creatinine, Ser: 2.63 mg/dL — ABNORMAL HIGH (ref 0.57–1.00)
GFR calc Af Amer: 19 mL/min/{1.73_m2} — ABNORMAL LOW (ref >59)
GFR calc non Af Amer: 17 mL/min/{1.73_m2} — ABNORMAL LOW (ref >59)
Globulin, Total: 1.9 g/dL (ref 1.5–4.5)
Glucose: 107 mg/dL — ABNORMAL HIGH (ref 65–99)
Potassium: 5 mmol/L (ref 3.5–5.2)
Sodium: 143 mmol/L (ref 134–144)
Total Protein: 6.1 g/dL (ref 6.0–8.5)

## 2019-12-01 LAB — VITAMIN B12: Vitamin B-12: 530 pg/mL (ref 232–1245)

## 2019-12-01 LAB — T4, FREE: Free T4: 1.17 ng/dL (ref 0.82–1.77)

## 2019-12-01 LAB — LIPID PANEL
Chol/HDL Ratio: 2.9 ratio (ref 0.0–4.4)
Cholesterol, Total: 138 mg/dL (ref 100–199)
HDL: 48 mg/dL (ref >39)
LDL Chol Calc (NIH): 72 mg/dL (ref 0–99)
Triglycerides: 93 mg/dL (ref 0–149)
VLDL Cholesterol Cal: 18 mg/dL (ref 5–40)

## 2019-12-01 LAB — TSH: TSH: 4.45 u[IU]/mL (ref 0.450–4.500)

## 2019-12-01 LAB — HEMOGLOBIN A1C
Est. average glucose Bld gHb Est-mCnc: 91 mg/dL
Hgb A1c MFr Bld: 4.8 % (ref 4.8–5.6)

## 2019-12-01 MED ORDER — SODIUM CHLORIDE 0.9 % IV SOLN
510.0000 mg | INTRAVENOUS | Status: DC
  Administered 2019-12-01: 510 mg via INTRAVENOUS
  Filled 2019-12-01: qty 17

## 2019-12-22 ENCOUNTER — Encounter: Payer: Self-pay | Admitting: Internal Medicine

## 2019-12-27 ENCOUNTER — Ambulatory Visit
Admission: RE | Admit: 2019-12-27 | Discharge: 2019-12-27 | Disposition: A | Discharge status: Home / Self Care | Payer: Medicare Other | Source: Ambulatory Visit | Attending: Internal Medicine | Admitting: Internal Medicine

## 2019-12-27 DIAGNOSIS — N200 Calculus of kidney: Secondary | ICD-10-CM | POA: Diagnosis not present

## 2019-12-27 DIAGNOSIS — R1030 Lower abdominal pain, unspecified: Secondary | ICD-10-CM

## 2020-01-17 ENCOUNTER — Other Ambulatory Visit: Payer: Self-pay | Admitting: Cardiology

## 2020-02-14 ENCOUNTER — Encounter: Payer: Self-pay | Admitting: Internal Medicine

## 2020-02-20 ENCOUNTER — Other Ambulatory Visit: Payer: Self-pay

## 2020-02-20 ENCOUNTER — Ambulatory Visit (INDEPENDENT_AMBULATORY_CARE_PROVIDER_SITE_OTHER): Payer: Medicare Other | Admitting: Internal Medicine

## 2020-02-20 ENCOUNTER — Encounter: Payer: Self-pay | Admitting: Internal Medicine

## 2020-02-20 VITALS — BP 136/84 | HR 64 | Temp 98.1°F | Ht 58.6 in | Wt 219.4 lb

## 2020-02-20 DIAGNOSIS — N184 Chronic kidney disease, stage 4 (severe): Secondary | ICD-10-CM | POA: Diagnosis not present

## 2020-02-20 DIAGNOSIS — Z6841 Body Mass Index (BMI) 40.0 and over, adult: Secondary | ICD-10-CM

## 2020-02-20 DIAGNOSIS — K5720 Diverticulitis of large intestine with perforation and abscess without bleeding: Secondary | ICD-10-CM

## 2020-02-20 DIAGNOSIS — I129 Hypertensive chronic kidney disease with stage 1 through stage 4 chronic kidney disease, or unspecified chronic kidney disease: Secondary | ICD-10-CM | POA: Diagnosis not present

## 2020-02-20 DIAGNOSIS — Z0000 Encounter for general adult medical examination without abnormal findings: Secondary | ICD-10-CM

## 2020-02-20 DIAGNOSIS — R946 Abnormal results of thyroid function studies: Secondary | ICD-10-CM | POA: Diagnosis not present

## 2020-02-20 MED ORDER — CEFTRIAXONE SODIUM 500 MG IJ SOLR
500.0000 mg | Freq: Once | INTRAMUSCULAR | Status: AC
  Administered 2020-02-20: 13:00:00 500 mg via INTRAMUSCULAR

## 2020-02-20 MED ORDER — HYDROCHLOROTHIAZIDE 12.5 MG PO CAPS: ORAL_CAPSULE | ORAL | 2 refills | Status: DC

## 2020-02-20 NOTE — Progress Notes (Signed)
I,[name],acting as a Neurosurgeon for Rita Aliment, MD.,have documented all relevant documentation on the behalf of Rita Aliment, MD,as directed by  Rita Aliment, MD while in the presence of Rita Aliment, MD.  This visit occurred during the SARS-CoV-2 public health emergency.  Safety protocols were in place, including screening questions prior to the visit, additional usage of staff PPE, and extensive cleaning of exam room while observing appropriate contact time as indicated for disinfecting solutions.  Subjective:     Patient ID: Rita Yoder , female    DOB: 1940/05/28 , 79 y.o.   MRN: [id number]   Chief Complaint  Patient presents with  . Annual Exam  . Hypertension    HPI  She is here today for a full physical exam. She is accompanied by her son. She states she feels well. However, he adds that she has been having abdominal discomfort. Denies fever/chills. Also with decreased appetite.   Hypertension This is a chronic problem. The current episode started more than 1 year ago. The problem has been gradually improving since onset. The problem is controlled. The current treatment provides moderate improvement. Compliance problems include exercise.  Hypertensive end-organ damage includes kidney disease.     Past Medical History:  Diagnosis Date  . Chronic kidney disease 12/2009  . Diabetes mellitus 2010  . Dyslipidemia   . Hypertension   . Monoclonal gammopathy 03/2010     Family History  Problem Relation Age of Onset  . Healthy Mother   . Heart attack Brother   . Cancer Maternal Aunt   . Cancer Maternal Uncle   . Heart Problems Maternal Grandmother      Current Outpatient Medications:  .  isosorbide mononitrate (IMDUR) 30 MG 24 hr tablet, Take 1 tablet (30 mg total) by mouth daily., Disp: 90 tablet, Rfl: 1 .  levothyroxine (SYNTHROID) 25 MCG tablet, TAKE 1 TABLET(25 MCG) BY MOUTH EVERY MORNING, Disp: 90 tablet, Rfl: 0 .  losartan (COZAAR) 100 MG  tablet, Take 1 tablet (100 mg total) by mouth daily., Disp: 90 tablet, Rfl: 1 .  MOBIC 15 MG tablet, Take 15 mg by mouth daily as needed., Disp: , Rfl:  .  solifenacin (VESICARE) 10 MG tablet, Take 10 mg by mouth daily., Disp: , Rfl:  .  acetaminophen (TYLENOL) 500 MG tablet, Take 500 mg by mouth every 6 (six) hours as needed., Disp: , Rfl:  .  amoxicillin-clavulanate (AUGMENTIN) 500-125 MG tablet, Take one tab po twice daily, Disp: 14 tablet, Rfl: 0 .  atorvastatin (LIPITOR) 20 MG tablet, TAKE 1 TABLET BY MOUTH ONCE DAILY, Disp: 90 tablet, Rfl: 3 .  Blood Glucose Calibration (OT ULTRA/FASTTK CNTRL SOLN) SOLN, , Disp: , Rfl:  .  Cholecalciferol (VITAMIN D3) 125 MCG (5000 UT) CAPS, Take by mouth. 1 per day, Disp: , Rfl:  .  hydrochlorothiazide (MICROZIDE) 12.5 MG capsule, TAKE 1 CAPSULE BY MOUTH EVERY DAY, Disp: 90 capsule, Rfl: 2 .  Mirabegron (MYRBETRIQ PO), Take 25 mg by mouth daily., Disp: , Rfl:  .  nitroGLYCERIN (NITROSTAT) 0.4 MG SL tablet, DISSOLVE 1 TABLET UNDER THE TONGUE AT FIRST SIGN OF ATTACKS, MAY REPEAT EVERY 5 MINUTES UP TO 3 TABLETS, IF NO RELLIEF SEEK MEDICAL HELP, Disp: 25 tablet, Rfl: 1 .  omega-3 acid ethyl esters (LOVAZA) 1 g capsule, TAKE 2 CAPSULES BY MOUTH TWICE DAILY (Patient not taking: Reported on 11/30/2019), Disp: 360 capsule, Rfl: 3   No Known Allergies    The patient states  she uses none for birth control. Last LMP was No LMP recorded.. Negative for Dysmenorrhea. Negative for: breast discharge, breast lump(s), breast pain and breast self exam. Associated symptoms include abnormal vaginal bleeding. Pertinent negatives include abnormal bleeding (hematology), anxiety, decreased libido, depression, difficulty falling sleep, dyspareunia, history of infertility, nocturia, sexual dysfunction, sleep disturbances, urinary incontinence, urinary urgency, vaginal discharge and vaginal itching. Diet regular.The patient states her exercise level is  minimal.  . The patient's tobacco  use is:  Social History   Tobacco Use  Smoking Status Never Smoker  Smokeless Tobacco Never Used  . She has been exposed to passive smoke. The patient's alcohol use is:  Social History   Substance and Sexual Activity  Alcohol Use No    Review of Systems  Constitutional: Negative.   HENT: Negative.   Eyes: Negative.   Respiratory: Negative.   Cardiovascular: Negative.   Gastrointestinal: Positive for abdominal pain.  Endocrine: Negative.   Genitourinary: Negative.   Musculoskeletal: Negative.   Skin: Negative.   Allergic/Immunologic: Negative.   Neurological: Negative.   Hematological: Negative.   Psychiatric/Behavioral: Negative.      Today's Vitals   02/20/20 1218  BP: 136/84  Pulse: 64  Temp: 98.1 F (36.7 C)  TempSrc: Oral  Weight: 219 lb 6.4 oz (99.5 kg)  Height: 4' 10.6" (1.488 m)  PainSc: 0-No pain   Body mass index is 44.92 kg/m.   Objective:  Physical Exam Vitals and nursing note reviewed.  Constitutional:      Appearance: Normal appearance. She is obese.     Comments: Pt is fully clothed for exam  HENT:     Head: Normocephalic and atraumatic.     Right Ear: Tympanic membrane, ear canal and external ear normal.     Left Ear: Tympanic membrane, ear canal and external ear normal.     Nose:     Comments: Deferred, masked    Mouth/Throat:     Comments: Deferred, masked Eyes:     Extraocular Movements: Extraocular movements intact.     Conjunctiva/sclera: Conjunctivae normal.     Pupils: Pupils are equal, round, and reactive to light.  Cardiovascular:     Rate and Rhythm: Normal rate and regular rhythm.     Pulses: Normal pulses.     Heart sounds: Normal heart sounds.  Pulmonary:     Effort: Pulmonary effort is normal.     Breath sounds: Normal breath sounds.  Chest:  Breasts:     Tanner Score is 5.      Comments: Not examined. Pt fully clothed per her request Abdominal:     General: Bowel sounds are normal.     Palpations: Abdomen is  soft.     Tenderness: There is abdominal tenderness.     Comments: Obese, soft LLQ tenderness to palpation Difficult to assess organomegaly due to body habitus  Genitourinary:    Comments: deferred Musculoskeletal:        General: Normal range of motion.     Cervical back: Normal range of motion and neck supple.  Skin:    General: Skin is warm and dry.  Neurological:     General: No focal deficit present.     Mental Status: She is alert and oriented to person, place, and time.  Psychiatric:        Mood and Affect: Mood normal.        Behavior: Behavior normal.         Assessment And Plan:  1. Routine general medical examination at health care facility Comments: A full exam was performed. She is encouraged to perform monthly self breast exams on the 9th of every month. PATIENT IS ADVISED TO GET 30-45 MINUTES REGULAR EXERCISE NO LESS THAN FOUR TO FIVE DAYS PER WEEK - BOTH WEIGHTBEARING EXERCISES AND AEROBIC ARE RECOMMENDED.  PATIENT IS ADVISED TO FOLLOW A HEALTHY DIET WITH AT LEAST SIX FRUITS/VEGGIES PER DAY, DECREASE INTAKE OF RED MEAT, AND TO INCREASE FISH INTAKE TO TWO DAYS PER WEEK.  MEATS/FISH SHOULD NOT BE FRIED, BAKED OR BROILED IS PREFERABLE.  I SUGGEST WEARING SPF 50 SUNSCREEN ON EXPOSED PARTS AND ESPECIALLY WHEN IN THE DIRECT SUNLIGHT FOR AN EXTENDED PERIOD OF TIME.  PLEASE AVOID FAST FOOD RESTAURANTS AND INCREASE YOUR WATER INTAKE.  2. Parenchymal renal hypertension, stage 1 through stage 4 or unspecified chronic kidney disease Comments: Chronic, fair control. Pt is aware optimal BP is less than 130/80. She will c/w current meds. EKG performed, SB w/o acute changes. Advised to follow low sodium diet. She will f/u in six months for re-evaluation.  - EKG 12-Lead - CBC no Diff - BMP8+EGFR - TSH - POCT Urinalysis Dipstick (81002) - POCT UA - Microalbumin  3. Chronic kidney disease, stage IV (severe) (HCC) Comments: Chronic, she is followed by Renal. Advised to keep BP  well controlled and to stay well hydrated in order to decrease risk of CKD progression.   4. Diverticulitis of large intestine with abscess without bleeding Comments: She was given Rocephin, $RemoveBeforeD'[id number]$  IM x 1. She was also given rx Augmentin, reduced dose due to renal clearance. Advised to take full course of abx. - cefTRIAXone (ROCEPHIN) injection 500 mg  5. Class 3 severe obesity due to excess calories with serious comorbidity and body mass index (BMI) of 45.0 to 49.9 in adult [facility]) Comments: She is encouraged to strive for BMI less than 40 to decrease cardiac risk. Advised to perform chair exercises while watching TV   Patient was given opportunity to ask questions. Patient verbalized understanding of the plan and was able to repeat key elements of the plan. All questions were answered to their satisfaction.  [name], MD   I, [name], MD, have reviewed all documentation for this visit. The documentation on 03/26/20 for the exam, diagnosis, procedures, and orders are all accurate and complete.  THE PATIENT IS ENCOURAGED TO PRACTICE SOCIAL DISTANCING DUE TO THE COVID-19 PANDEMIC.

## 2020-02-21 ENCOUNTER — Other Ambulatory Visit: Payer: Self-pay | Admitting: Internal Medicine

## 2020-02-21 ENCOUNTER — Telehealth: Payer: Self-pay

## 2020-02-21 LAB — BMP8+EGFR
BUN/Creatinine Ratio: 20 (ref 12–28)
BUN: 44 mg/dL — ABNORMAL HIGH (ref 8–27)
CO2: 20 mmol/L (ref 20–29)
Calcium: 9.1 mg/dL (ref 8.7–10.3)
Chloride: 112 mmol/L — ABNORMAL HIGH (ref 96–106)
Creatinine, Ser: 2.24 mg/dL — ABNORMAL HIGH (ref 0.57–1.00)
GFR calc Af Amer: 23 mL/min/{1.73_m2} — ABNORMAL LOW (ref >59)
GFR calc non Af Amer: 20 mL/min/{1.73_m2} — ABNORMAL LOW (ref >59)
Glucose: 87 mg/dL (ref 65–99)
Potassium: 5.2 mmol/L (ref 3.5–5.2)
Sodium: 143 mmol/L (ref 134–144)

## 2020-02-21 LAB — CBC
Hematocrit: 31 % — ABNORMAL LOW (ref 34.0–46.6)
Hemoglobin: 10.4 g/dL — ABNORMAL LOW (ref 11.1–15.9)
MCH: 32.3 pg (ref 26.6–33.0)
MCHC: 33.5 g/dL (ref 31.5–35.7)
MCV: 96 fL (ref 79–97)
Platelets: 187 10*3/uL (ref 150–450)
RBC: 3.22 x10E6/uL — ABNORMAL LOW (ref 3.77–5.28)
RDW: 11.4 % — ABNORMAL LOW (ref 11.7–15.4)
WBC: 7.8 10*3/uL (ref 3.4–10.8)

## 2020-02-21 LAB — TSH: TSH: 5.1 u[IU]/mL — ABNORMAL HIGH (ref 0.450–4.500)

## 2020-02-21 NOTE — Telephone Encounter (Signed)
The pt was asked how she is feeling this morning and did she tolerate the antibiotic injection that was given yesterday and the pt said she feels better and yes she tolerated the medication.

## 2020-02-22 ENCOUNTER — Other Ambulatory Visit: Payer: Self-pay | Admitting: Internal Medicine

## 2020-02-22 LAB — POCT URINALYSIS DIPSTICK
Bilirubin, UA: NEGATIVE
Blood, UA: NEGATIVE
Glucose, UA: NEGATIVE
Ketones, UA: NEGATIVE
Leukocytes, UA: NEGATIVE
Nitrite, UA: NEGATIVE
Protein, UA: POSITIVE — AB
Spec Grav, UA: 1.025 (ref 1.010–1.025)
Urobilinogen, UA: 0.2 E.U./dL
pH, UA: 5.5 (ref 5.0–8.0)

## 2020-02-22 LAB — POCT UA - MICROALBUMIN
Creatinine, POC: 300 mg/dL
Microalbumin Ur, POC: 150 mg/L

## 2020-02-22 MED ORDER — AMOXICILLIN-POT CLAVULANATE 500-125 MG PO TABS: ORAL_TABLET | ORAL | 0 refills | Status: DC

## 2020-02-27 ENCOUNTER — Encounter: Payer: Self-pay | Admitting: Internal Medicine

## 2020-02-29 ENCOUNTER — Encounter: Payer: Self-pay | Admitting: Internal Medicine

## 2020-02-29 DIAGNOSIS — H25813 Combined forms of age-related cataract, bilateral: Secondary | ICD-10-CM | POA: Diagnosis not present

## 2020-02-29 LAB — SPECIMEN STATUS REPORT

## 2020-02-29 LAB — T4, FREE: Free T4: 1.21 ng/dL (ref 0.82–1.77)

## 2020-03-24 LAB — HEMOCCULT GUIAC POC 1CARD (OFFICE)
Card #2 Fecal Occult Blod, POC: NEGATIVE
Card #3 Fecal Occult Blood, POC: NEGATIVE
Fecal Occult Blood, POC: NEGATIVE

## 2020-03-26 NOTE — Patient Instructions (Signed)
Health Maintenance, Female Adopting a healthy lifestyle and getting preventive care are important in promoting health and wellness. Ask your health care provider about:  The right schedule for you to have regular tests and exams.  Things you can do on your own to prevent diseases and keep yourself healthy. What should I know about diet, weight, and exercise? Eat a healthy diet  Eat a diet that includes plenty of vegetables, fruits, low-fat dairy products, and lean protein.  Do not eat a lot of foods that are high in solid fats, added sugars, or sodium.   Maintain a healthy weight Body mass index (BMI) is used to identify weight problems. It estimates body fat based on height and weight. Your health care provider can help determine your BMI and help you achieve or maintain a healthy weight. Get regular exercise Get regular exercise. This is one of the most important things you can do for your health. Most adults should:  Exercise for at least 150 minutes each week. The exercise should increase your heart rate and make you sweat (moderate-intensity exercise).  Do strengthening exercises at least twice a week. This is in addition to the moderate-intensity exercise.  Spend less time sitting. Even light physical activity can be beneficial. Watch cholesterol and blood lipids Have your blood tested for lipids and cholesterol at 81 years of age, then have this test every 5 years. Have your cholesterol levels checked more often if:  Your lipid or cholesterol levels are high.  You are older than 81 years of age.  You are at high risk for heart disease. What should I know about cancer screening? Depending on your health history and family history, you may need to have cancer screening at various ages. This may include screening for:  Breast cancer.  Cervical cancer.  Colorectal cancer.  Skin cancer.  Lung cancer. What should I know about heart disease, diabetes, and high blood  pressure? Blood pressure and heart disease  High blood pressure causes heart disease and increases the risk of stroke. This is more likely to develop in people who have high blood pressure readings, are of African descent, or are overweight.  Have your blood pressure checked: ? Every 3-5 years if you are 18-39 years of age. ? Every year if you are 40 years old or older. Diabetes Have regular diabetes screenings. This checks your fasting blood sugar level. Have the screening done:  Once every three years after age 40 if you are at a normal weight and have a low risk for diabetes.  More often and at a younger age if you are overweight or have a high risk for diabetes. What should I know about preventing infection? Hepatitis B If you have a higher risk for hepatitis B, you should be screened for this virus. Talk with your health care provider to find out if you are at risk for hepatitis B infection. Hepatitis C Testing is recommended for:  Everyone born from 1945 through 1965.  Anyone with known risk factors for hepatitis C. Sexually transmitted infections (STIs)  Get screened for STIs, including gonorrhea and chlamydia, if: ? You are sexually active and are younger than 81 years of age. ? You are older than 81 years of age and your health care provider tells you that you are at risk for this type of infection. ? Your sexual activity has changed since you were last screened, and you are at increased risk for chlamydia or gonorrhea. Ask your health care provider   if you are at risk.  Ask your health care provider about whether you are at high risk for HIV. Your health care provider may recommend a prescription medicine to help prevent HIV infection. If you choose to take medicine to prevent HIV, you should first get tested for HIV. You should then be tested every 3 months for as long as you are taking the medicine. Pregnancy  If you are about to stop having your period (premenopausal) and  you may become pregnant, seek counseling before you get pregnant.  Take 400 to 800 micrograms (mcg) of folic acid every day if you become pregnant.  Ask for birth control (contraception) if you want to prevent pregnancy. Osteoporosis and menopause Osteoporosis is a disease in which the bones lose minerals and strength with aging. This can result in bone fractures. If you are 65 years old or older, or if you are at risk for osteoporosis and fractures, ask your health care provider if you should:  Be screened for bone loss.  Take a calcium or vitamin D supplement to lower your risk of fractures.  Be given hormone replacement therapy (HRT) to treat symptoms of menopause. Follow these instructions at home: Lifestyle  Do not use any products that contain nicotine or tobacco, such as cigarettes, e-cigarettes, and chewing tobacco. If you need help quitting, ask your health care provider.  Do not use street drugs.  Do not share needles.  Ask your health care provider for help if you need support or information about quitting drugs. Alcohol use  Do not drink alcohol if: ? Your health care provider tells you not to drink. ? You are pregnant, may be pregnant, or are planning to become pregnant.  If you drink alcohol: ? Limit how much you use to 0-1 drink a day. ? Limit intake if you are breastfeeding.  Be aware of how much alcohol is in your drink. In the [place] one drink equals one 12 oz bottle of beer (355 mL), one 5 oz glass of wine (148 mL), or one 1 oz glass of hard liquor (44 mL). General instructions  Schedule regular health, dental, and eye exams.  Stay current with your vaccines.  Tell your health care provider if: ? You often feel depressed. ? You have ever been abused or do not feel safe at home. Summary  Adopting a healthy lifestyle and getting preventive care are important in promoting health and wellness.  Follow your health care provider's instructions about healthy  diet, exercising, and getting tested or screened for diseases.  Follow your health care provider's instructions on monitoring your cholesterol and blood pressure. This information is not intended to replace advice given to you by your health care provider. Make sure you discuss any questions you have with your health care provider. Document Revised: 02/17/2018 Document Reviewed: 02/17/2018 Elsevier Patient Education  2021 Elsevier Inc.  

## 2020-03-27 ENCOUNTER — Telehealth: Payer: Self-pay

## 2020-03-27 NOTE — Telephone Encounter (Signed)
I called the pt because Dr. Baird Cancer wanted to check and see how the pt is doing, is her appetite better and is her belly better.  The pt needs to also be scheduled for an appt for a blood pressure check in June and for a physical after 02/19/2021.

## 2020-04-04 ENCOUNTER — Other Ambulatory Visit (INDEPENDENT_AMBULATORY_CARE_PROVIDER_SITE_OTHER): Payer: Medicare Other

## 2020-04-04 DIAGNOSIS — D649 Anemia, unspecified: Secondary | ICD-10-CM

## 2020-04-19 DIAGNOSIS — H2513 Age-related nuclear cataract, bilateral: Secondary | ICD-10-CM | POA: Diagnosis not present

## 2020-04-19 DIAGNOSIS — H25043 Posterior subcapsular polar age-related cataract, bilateral: Secondary | ICD-10-CM | POA: Diagnosis not present

## 2020-04-19 DIAGNOSIS — H18413 Arcus senilis, bilateral: Secondary | ICD-10-CM | POA: Diagnosis not present

## 2020-04-19 DIAGNOSIS — H25013 Cortical age-related cataract, bilateral: Secondary | ICD-10-CM | POA: Diagnosis not present

## 2020-04-19 DIAGNOSIS — H2512 Age-related nuclear cataract, left eye: Secondary | ICD-10-CM | POA: Diagnosis not present

## 2020-05-01 ENCOUNTER — Other Ambulatory Visit: Payer: Self-pay

## 2020-05-01 MED ORDER — LOSARTAN POTASSIUM 100 MG PO TABS
100.0000 mg | ORAL_TABLET | Freq: Every day | ORAL | 1 refills | Status: DC
Start: 2020-05-01 — End: 2020-10-03

## 2020-05-02 DIAGNOSIS — H2512 Age-related nuclear cataract, left eye: Secondary | ICD-10-CM | POA: Diagnosis not present

## 2020-05-03 DIAGNOSIS — H2511 Age-related nuclear cataract, right eye: Secondary | ICD-10-CM | POA: Diagnosis not present

## 2020-05-04 ENCOUNTER — Telehealth: Payer: Self-pay

## 2020-05-04 NOTE — Telephone Encounter (Signed)
I called patient and left her a vm to call the office. Dr.Sanders wanted me to tell her that she should stop taking the mobic if she is currently taking it because it is not good for her kidneys. YL,RMA

## 2020-05-16 DIAGNOSIS — H2511 Age-related nuclear cataract, right eye: Secondary | ICD-10-CM | POA: Diagnosis not present

## 2020-06-06 ENCOUNTER — Other Ambulatory Visit: Payer: Self-pay | Admitting: Internal Medicine

## 2020-06-11 DIAGNOSIS — E1122 Type 2 diabetes mellitus with diabetic chronic kidney disease: Secondary | ICD-10-CM | POA: Diagnosis not present

## 2020-06-11 DIAGNOSIS — I129 Hypertensive chronic kidney disease with stage 1 through stage 4 chronic kidney disease, or unspecified chronic kidney disease: Secondary | ICD-10-CM | POA: Diagnosis not present

## 2020-06-11 DIAGNOSIS — M25559 Pain in unspecified hip: Secondary | ICD-10-CM | POA: Diagnosis not present

## 2020-06-11 DIAGNOSIS — N182 Chronic kidney disease, stage 2 (mild): Secondary | ICD-10-CM | POA: Diagnosis not present

## 2020-06-11 DIAGNOSIS — D509 Iron deficiency anemia, unspecified: Secondary | ICD-10-CM | POA: Diagnosis not present

## 2020-07-17 ENCOUNTER — Other Ambulatory Visit: Payer: Self-pay | Admitting: Internal Medicine

## 2020-07-23 DIAGNOSIS — Z1231 Encounter for screening mammogram for malignant neoplasm of breast: Secondary | ICD-10-CM | POA: Diagnosis not present

## 2020-07-23 LAB — HM MAMMOGRAPHY

## 2020-07-25 ENCOUNTER — Encounter: Payer: Self-pay | Admitting: Internal Medicine

## 2020-08-20 ENCOUNTER — Other Ambulatory Visit: Payer: Self-pay

## 2020-08-20 ENCOUNTER — Ambulatory Visit (INDEPENDENT_AMBULATORY_CARE_PROVIDER_SITE_OTHER): Payer: Medicare Other | Admitting: Internal Medicine

## 2020-08-20 ENCOUNTER — Encounter: Payer: Self-pay | Admitting: Internal Medicine

## 2020-08-20 VITALS — BP 128/76 | HR 62 | Temp 97.9°F | Ht <= 58 in | Wt 214.8 lb

## 2020-08-20 DIAGNOSIS — E1142 Type 2 diabetes mellitus with diabetic polyneuropathy: Secondary | ICD-10-CM

## 2020-08-20 DIAGNOSIS — R634 Abnormal weight loss: Secondary | ICD-10-CM | POA: Diagnosis not present

## 2020-08-20 DIAGNOSIS — N184 Chronic kidney disease, stage 4 (severe): Secondary | ICD-10-CM | POA: Diagnosis not present

## 2020-08-20 DIAGNOSIS — Z6841 Body Mass Index (BMI) 40.0 and over, adult: Secondary | ICD-10-CM

## 2020-08-20 DIAGNOSIS — Z79899 Other long term (current) drug therapy: Secondary | ICD-10-CM

## 2020-08-20 DIAGNOSIS — D649 Anemia, unspecified: Secondary | ICD-10-CM | POA: Diagnosis not present

## 2020-08-20 DIAGNOSIS — R7989 Other specified abnormal findings of blood chemistry: Secondary | ICD-10-CM | POA: Diagnosis not present

## 2020-08-20 DIAGNOSIS — I129 Hypertensive chronic kidney disease with stage 1 through stage 4 chronic kidney disease, or unspecified chronic kidney disease: Secondary | ICD-10-CM | POA: Diagnosis not present

## 2020-08-20 DIAGNOSIS — I700 Atherosclerosis of aorta: Secondary | ICD-10-CM

## 2020-08-20 DIAGNOSIS — M25552 Pain in left hip: Secondary | ICD-10-CM

## 2020-08-20 MED ORDER — NITROGLYCERIN 0.4 MG SL SUBL: SUBLINGUAL_TABLET | SUBLINGUAL | 1 refills | Status: AC

## 2020-08-20 NOTE — Progress Notes (Signed)
I,[name],acting as a Education administrator for [name], MD.,have documented all relevant documentation on the behalf of [name], MD,as directed by  [name], MD while in the presence of [name], MD.  This visit occurred during the SARS-CoV-2 public health emergency.  Safety protocols were in place, including screening questions prior to the visit, additional usage of staff PPE, and extensive cleaning of exam room while observing appropriate contact time as indicated for disinfecting solutions.  Subjective:     Patient ID: Rita Yoder , female    DOB: Jul 09, 1940 , 80 y.o.   MRN: [id number]   Chief Complaint  Patient presents with   Diabetes   Hypertension    HPI  She presents today for diabetes and BP check.  She reports compliance with meds. She denies headaches, chest pain and shortness of breath. She has been able to control DM with dietary changes, she is not on any meds.   Diabetes She presents for her follow-up diabetic visit. She has type 2 diabetes mellitus. Her disease course has been stable. There are no hypoglycemic associated symptoms. There are no diabetic associated symptoms. Diabetic complications include nephropathy. Risk factors for coronary artery disease include post-menopausal, sedentary lifestyle and hypertension. Current diabetic treatment includes diet.  Hypertension This is a chronic problem. The current episode started more than 1 year ago. The problem has been gradually improving since onset. The problem is controlled. The current treatment provides moderate improvement. Hypertensive end-organ damage includes kidney disease.    Past Medical History:  Diagnosis Date   Chronic kidney disease 12/2009   Diabetes mellitus 2010   Dyslipidemia    Hypertension    Monoclonal gammopathy 03/2010     Family History  Problem Relation Age of Onset   Healthy Mother    Heart attack Brother    Cancer Maternal Aunt    Cancer Maternal Uncle     Heart Problems Maternal Grandmother      Current Outpatient Medications:    atorvastatin (LIPITOR) 20 MG tablet, TAKE 1 TABLET BY MOUTH ONCE DAILY, Disp: 90 tablet, Rfl: 3   Blood Glucose Calibration (OT ULTRA/FASTTK CNTRL SOLN) SOLN, , Disp: , Rfl:    Cholecalciferol (VITAMIN D3) 50 MCG (2000 UT) capsule, Take by mouth. 1 per day, Disp: , Rfl:    hydrochlorothiazide (MICROZIDE) 12.5 MG capsule, TAKE 1 CAPSULE BY MOUTH EVERY DAY, Disp: 90 capsule, Rfl: 2   isosorbide mononitrate (IMDUR) 30 MG 24 hr tablet, TAKE 1 TABLET(30 MG) BY MOUTH DAILY, Disp: 90 tablet, Rfl: 1   levothyroxine (SYNTHROID) 25 MCG tablet, TAKE 1 TABLET(25 MCG) BY MOUTH EVERY MORNING, Disp: 90 tablet, Rfl: 0   losartan (COZAAR) 100 MG tablet, Take 1 tablet (100 mg total) by mouth daily., Disp: 90 tablet, Rfl: 1   solifenacin (VESICARE) 5 MG tablet, Take 5 mg by mouth daily., Disp: , Rfl:    acetaminophen (TYLENOL) 500 MG tablet, Take 500 mg by mouth every 6 (six) hours as needed., Disp: , Rfl:    MOBIC 15 MG tablet, Take 15 mg by mouth daily as needed. (Patient not taking: Reported on 08/20/2020), Disp: , Rfl:    nitroGLYCERIN (NITROSTAT) 0.4 MG SL tablet, DISSOLVE 1 TABLET UNDER THE TONGUE AT FIRST SIGN OF ATTACKS, MAY REPEAT EVERY 5 MINUTES UP TO 3 TABLETS, IF NO RELLIEF SEEK MEDICAL HELP (Patient not taking: Reported on 08/20/2020), Disp: 25 tablet, Rfl: 1   omega-3 acid ethyl esters (LOVAZA) 1 g capsule, TAKE 2  CAPSULES BY MOUTH TWICE DAILY (Patient not taking: Reported on 08/20/2020), Disp: 360 capsule, Rfl: 3   No Known Allergies     Review of Systems  Constitutional: Negative.   Respiratory: Negative.    Cardiovascular: Negative.   Gastrointestinal: Negative.   Musculoskeletal:        She c/o intermittent left hip pain. Denies fall/trauma.   Psychiatric/Behavioral: Negative.    All other systems reviewed and are negative.   Today's Vitals   08/20/20 1134  BP: 128/76  Pulse: 62  Temp: 97.9 F (36.6 C)   TempSrc: Oral  Weight: 214 lb 12.8 oz (97.4 kg)  Height: $Remove'4\' 10"'QsPJyew$  (1.473 m)  PainSc: 6   PainLoc: Hip   Body mass index is 44.89 kg/m.  Wt Readings from Last 3 Encounters:  08/20/20 214 lb 12.8 oz (97.4 kg)  02/20/20 219 lb 6.4 oz (99.5 kg)  11/30/19 225 lb (102.1 kg)    BP Readings from Last 3 Encounters:  08/20/20 128/76  02/20/20 136/84  12/01/19 135/79    Objective:  Physical Exam Vitals and nursing note reviewed.  Constitutional:      Appearance: Normal appearance. She is obese.  HENT:     Head: Normocephalic and atraumatic.     Nose:     Comments: Masked     Mouth/Throat:     Comments: Masked  Eyes:     Extraocular Movements: Extraocular movements intact.  Cardiovascular:     Rate and Rhythm: Normal rate and regular rhythm.     Heart sounds: Normal heart sounds.  Pulmonary:     Effort: Pulmonary effort is normal.     Breath sounds: Normal breath sounds.  Abdominal:     General: Bowel sounds are normal.     Palpations: Abdomen is soft.  Musculoskeletal:     Cervical back: Normal range of motion.  Skin:    General: Skin is warm.  Neurological:     General: No focal deficit present.     Mental Status: She is alert.  Psychiatric:        Mood and Affect: Mood normal.        Behavior: Behavior normal.        Assessment And Plan:     1. Diabetic polyneuropathy associated with type 2 diabetes mellitus ([place]) Comments: Chronic, I wil check labs as listed below. Encouraged to comply with dietary guidelines.  - Hemoglobin A1c  2. Parenchymal renal hypertension, stage 1 through stage 4 or unspecified chronic kidney disease Comments: Chronic, well controlled. Advised to avoid adding salt to her foods.  - CMP14+EGFR - Protein electrophoresis, serum - Parathyroid Hormone, Intact w/Ca - Phosphorus  3. Chronic kidney disease, stage IV (severe) ([facility]) Comments: Chronic, also followed by Nephrology. Encouraged to stay well hydrated.  I will check Renal labs today.    4. Left hip pain Comments: Chronic, states sx have improved.   5. Weight loss Comments: She has lost 5 pounds since Dec 2021. She has lost 11 pounds since Sept 2021. She states her appetite has decreased. I will also check thyroid panel.  - Prealbumin  6. Abnormal thyroid blood test Comments: I will recheck thyroid panel today.  - TSH - T4, free  7. Atherosclerosis of aorta ([place]) Comments: She is encouraged to take atorvastatin daily as prescribed and to avoid fried foods.   8. Class 3 severe obesity due to excess calories with serious comorbidity and body mass index (BMI) of 40.0 to 44.9 in adult [facility]) Comments: BMI  44. Advised to incorporate more activity into her daily routine, including chair exercises.   9. Drug therapy - Vitamin B12  Patient was given opportunity to ask questions. Patient verbalized understanding of the plan and was able to repeat key elements of the plan. All questions were answered to their satisfaction.   I, [name], MD, have reviewed all documentation for this visit. The documentation on 08/20/20 for the exam, diagnosis, procedures, and orders are all accurate and complete.  THE PATIENT IS ENCOURAGED TO PRACTICE SOCIAL DISTANCING DUE TO THE COVID-19 PANDEMIC.

## 2020-08-20 NOTE — Patient Instructions (Signed)

## 2020-08-22 ENCOUNTER — Encounter: Payer: Self-pay | Admitting: Internal Medicine

## 2020-08-22 LAB — CMP14+EGFR
ALT: 27 IU/L (ref 0–32)
AST: 20 IU/L (ref 0–40)
Albumin/Globulin Ratio: 1.7 (ref 1.2–2.2)
Albumin: 4.1 g/dL (ref 3.7–4.7)
Alkaline Phosphatase: 102 IU/L (ref 44–121)
BUN/Creatinine Ratio: 23 (ref 12–28)
BUN: 47 mg/dL — ABNORMAL HIGH (ref 8–27)
Bilirubin Total: 0.4 mg/dL (ref 0.0–1.2)
CO2: 18 mmol/L — ABNORMAL LOW (ref 20–29)
Calcium: 9.4 mg/dL (ref 8.7–10.3)
Chloride: 108 mmol/L — ABNORMAL HIGH (ref 96–106)
Creatinine, Ser: 2.07 mg/dL — ABNORMAL HIGH (ref 0.57–1.00)
Globulin, Total: 2.4 g/dL (ref 1.5–4.5)
Glucose: 95 mg/dL (ref 65–99)
Potassium: 5.1 mmol/L (ref 3.5–5.2)
Sodium: 142 mmol/L (ref 134–144)
Total Protein: 6.5 g/dL (ref 6.0–8.5)
eGFR: 24 mL/min/{1.73_m2} — ABNORMAL LOW (ref >59)

## 2020-08-22 LAB — PROTEIN ELECTROPHORESIS, SERUM
A/G Ratio: 1.3 (ref 0.7–1.7)
Albumin ELP: 3.7 g/dL (ref 2.9–4.4)
Alpha 1: 0.2 g/dL (ref 0.0–0.4)
Alpha 2: 0.9 g/dL (ref 0.4–1.0)
Beta: 1 g/dL (ref 0.7–1.3)
Gamma Globulin: 0.8 g/dL (ref 0.4–1.8)
Globulin, Total: 2.8 g/dL (ref 2.2–3.9)
M-Spike, %: 0.3 g/dL — ABNORMAL HIGH

## 2020-08-22 LAB — HEMOGLOBIN A1C
Est. average glucose Bld gHb Est-mCnc: 100 mg/dL
Hgb A1c MFr Bld: 5.1 % (ref 4.8–5.6)

## 2020-08-22 LAB — PHOSPHORUS: Phosphorus: 3.3 mg/dL (ref 3.0–4.3)

## 2020-08-22 LAB — PTH, INTACT AND CALCIUM: PTH: 79 pg/mL — ABNORMAL HIGH (ref 15–65)

## 2020-08-22 LAB — VITAMIN B12: Vitamin B-12: 535 pg/mL (ref 232–1245)

## 2020-08-22 LAB — TSH: TSH: 4.45 u[IU]/mL (ref 0.450–4.500)

## 2020-08-22 LAB — PREALBUMIN: PREALBUMIN: 29 mg/dL (ref 9–32)

## 2020-08-22 LAB — T4, FREE: Free T4: 1.16 ng/dL (ref 0.82–1.77)

## 2020-08-26 DIAGNOSIS — R7989 Other specified abnormal findings of blood chemistry: Secondary | ICD-10-CM | POA: Insufficient documentation

## 2020-08-26 DIAGNOSIS — M25552 Pain in left hip: Secondary | ICD-10-CM | POA: Insufficient documentation

## 2020-08-26 DIAGNOSIS — R634 Abnormal weight loss: Secondary | ICD-10-CM | POA: Insufficient documentation

## 2020-08-26 DIAGNOSIS — N184 Chronic kidney disease, stage 4 (severe): Secondary | ICD-10-CM | POA: Insufficient documentation

## 2020-09-04 ENCOUNTER — Other Ambulatory Visit: Payer: Self-pay

## 2020-09-04 ENCOUNTER — Ambulatory Visit: Payer: Medicare Other

## 2020-09-04 VITALS — BP 126/80 | HR 71 | Temp 98.2°F | Ht <= 58 in | Wt 220.6 lb

## 2020-09-04 DIAGNOSIS — Z79899 Other long term (current) drug therapy: Secondary | ICD-10-CM

## 2020-09-04 DIAGNOSIS — I10 Essential (primary) hypertension: Secondary | ICD-10-CM

## 2020-09-04 NOTE — Progress Notes (Signed)
Pt is here for a BPC. She is taking medications as directed. No complications. BP Readings from Last 3 Encounters:  09/04/20 126/80  08/20/20 128/76  02/20/20 136/84   Pt will continue with losartan and stop hctz, as directed by provider.

## 2020-09-05 ENCOUNTER — Encounter: Payer: Self-pay | Admitting: Internal Medicine

## 2020-09-05 LAB — BMP8+EGFR
BUN/Creatinine Ratio: 22 (ref 12–28)
BUN: 46 mg/dL — ABNORMAL HIGH (ref 8–27)
CO2: 18 mmol/L — ABNORMAL LOW (ref 20–29)
Calcium: 9.3 mg/dL (ref 8.7–10.3)
Chloride: 111 mmol/L — ABNORMAL HIGH (ref 96–106)
Creatinine, Ser: 2.09 mg/dL — ABNORMAL HIGH (ref 0.57–1.00)
Glucose: 116 mg/dL — ABNORMAL HIGH (ref 65–99)
Potassium: 5.2 mmol/L (ref 3.5–5.2)
Sodium: 145 mmol/L — ABNORMAL HIGH (ref 134–144)
eGFR: 24 mL/min/{1.73_m2} — ABNORMAL LOW (ref >59)

## 2020-09-12 ENCOUNTER — Other Ambulatory Visit: Payer: Self-pay | Admitting: Internal Medicine

## 2020-10-03 ENCOUNTER — Other Ambulatory Visit: Payer: Self-pay | Admitting: Internal Medicine

## 2020-12-05 ENCOUNTER — Telehealth: Payer: Self-pay

## 2020-12-05 ENCOUNTER — Ambulatory Visit: Payer: Medicare Other | Admitting: Internal Medicine

## 2020-12-05 NOTE — Telephone Encounter (Signed)
This nurse called patient in regards to missed appointments. Message left for patient to call back in order to reschedule for another time. 

## 2020-12-08 HISTORY — PX: CATARACT EXTRACTION: SUR2

## 2020-12-11 ENCOUNTER — Other Ambulatory Visit: Payer: Self-pay | Admitting: Internal Medicine

## 2020-12-20 ENCOUNTER — Ambulatory Visit (INDEPENDENT_AMBULATORY_CARE_PROVIDER_SITE_OTHER): Payer: Medicare Other

## 2020-12-20 VITALS — Ht 61.0 in | Wt 215.0 lb

## 2020-12-20 DIAGNOSIS — Z0000 Encounter for general adult medical examination without abnormal findings: Secondary | ICD-10-CM | POA: Diagnosis not present

## 2020-12-20 NOTE — Patient Instructions (Signed)
Rita Yoder , Thank you for taking time to come for your Medicare Wellness Visit. I appreciate your ongoing commitment to your health goals. Please review the following plan we discussed and let me know if I can assist you in the future.   Screening recommendations/referrals: Colonoscopy: not required Mammogram: completed 07/23/2020 Bone Density: completed 02/23/2017 Recommended yearly ophthalmology/optometry visit for glaucoma screening and checkup Recommended yearly dental visit for hygiene and checkup  Vaccinations: Influenza vaccine: decline Pneumococcal vaccine: decline Tdap vaccine: completed 11/10/2012, due 11/11/2022 Shingles vaccine: discussed   Covid-19: 02/20/2020, 06/28/2019, 06/04/2019  Advanced directives: Please bring a copy of your POA (Power of Attorney) and/or Living Will to your next appointment.   Conditions/risks identified: none  Next appointment: Follow up in one year for your annual wellness visit    Preventive Care 65 Years and Older, Female Preventive care refers to lifestyle choices and visits with your health care provider that can promote health and wellness. What does preventive care include? A yearly physical exam. This is also called an annual well check. Dental exams once or twice a year. Routine eye exams. Ask your health care provider how often you should have your eyes checked. Personal lifestyle choices, including: Daily care of your teeth and gums. Regular physical activity. Eating a healthy diet. Avoiding tobacco and drug use. Limiting alcohol use. Practicing safe sex. Taking low-dose aspirin every day. Taking vitamin and mineral supplements as recommended by your health care provider. What happens during an annual well check? The services and screenings done by your health care provider during your annual well check will depend on your age, overall health, lifestyle risk factors, and family history of disease. Counseling  Your health care  provider may ask you questions about your: Alcohol use. Tobacco use. Drug use. Emotional well-being. Home and relationship well-being. Sexual activity. Eating habits. History of falls. Memory and ability to understand (cognition). Work and work Statistician. Reproductive health. Screening  You may have the following tests or measurements: Height, weight, and BMI. Blood pressure. Lipid and cholesterol levels. These may be checked every 5 years, or more frequently if you are over 42 years old. Skin check. Lung cancer screening. You may have this screening every year starting at age 24 if you have a 30-pack-year history of smoking and currently smoke or have quit within the past 15 years. Fecal occult blood test (FOBT) of the stool. You may have this test every year starting at age 44. Flexible sigmoidoscopy or colonoscopy. You may have a sigmoidoscopy every 5 years or a colonoscopy every 10 years starting at age 24. Hepatitis C blood test. Hepatitis B blood test. Sexually transmitted disease (STD) testing. Diabetes screening. This is done by checking your blood sugar (glucose) after you have not eaten for a while (fasting). You may have this done every 1-3 years. Bone density scan. This is done to screen for osteoporosis. You may have this done starting at age 33. Mammogram. This may be done every 1-2 years. Talk to your health care provider about how often you should have regular mammograms. Talk with your health care provider about your test results, treatment options, and if necessary, the need for more tests. Vaccines  Your health care provider may recommend certain vaccines, such as: Influenza vaccine. This is recommended every year. Tetanus, diphtheria, and acellular pertussis (Tdap, Td) vaccine. You may need a Td booster every 10 years. Zoster vaccine. You may need this after age 19. Pneumococcal 13-valent conjugate (PCV13) vaccine. One dose is recommended  after age  61. Pneumococcal polysaccharide (PPSV23) vaccine. One dose is recommended after age 62. Talk to your health care provider about which screenings and vaccines you need and how often you need them. This information is not intended to replace advice given to you by your health care provider. Make sure you discuss any questions you have with your health care provider. Document Released: 03/23/2015 Document Revised: 11/14/2015 Document Reviewed: 12/26/2014 Elsevier Interactive Patient Education  2017 [place] Prevention in the Home Falls can cause injuries. They can happen to people of all ages. There are many things you can do to make your home safe and to help prevent falls. What can I do on the outside of my home? Regularly fix the edges of walkways and driveways and fix any cracks. Remove anything that might make you trip as you walk through a door, such as a raised step or threshold. Trim any bushes or trees on the path to your home. Use bright outdoor lighting. Clear any walking paths of anything that might make someone trip, such as rocks or tools. Regularly check to see if handrails are loose or broken. Make sure that both sides of any steps have handrails. Any raised decks and porches should have guardrails on the edges. Have any leaves, snow, or ice cleared regularly. Use sand or salt on walking paths during winter. Clean up any spills in your garage right away. This includes oil or grease spills. What can I do in the bathroom? Use night lights. Install grab bars by the toilet and in the tub and shower. Do not use towel bars as grab bars. Use non-skid mats or decals in the tub or shower. If you need to sit down in the shower, use a plastic, non-slip stool. Keep the floor dry. Clean up any water that spills on the floor as soon as it happens. Remove soap buildup in the tub or shower regularly. Attach bath mats securely with double-sided non-slip rug tape. Do not have throw  rugs and other things on the floor that can make you trip. What can I do in the bedroom? Use night lights. Make sure that you have a light by your bed that is easy to reach. Do not use any sheets or blankets that are too big for your bed. They should not hang down onto the floor. Have a firm chair that has side arms. You can use this for support while you get dressed. Do not have throw rugs and other things on the floor that can make you trip. What can I do in the kitchen? Clean up any spills right away. Avoid walking on wet floors. Keep items that you use a lot in easy-to-reach places. If you need to reach something above you, use a strong step stool that has a grab bar. Keep electrical cords out of the way. Do not use floor polish or wax that makes floors slippery. If you must use wax, use non-skid floor wax. Do not have throw rugs and other things on the floor that can make you trip. What can I do with my stairs? Do not leave any items on the stairs. Make sure that there are handrails on both sides of the stairs and use them. Fix handrails that are broken or loose. Make sure that handrails are as long as the stairways. Check any carpeting to make sure that it is firmly attached to the stairs. Fix any carpet that is loose or worn. Avoid having throw  rugs at the top or bottom of the stairs. If you do have throw rugs, attach them to the floor with carpet tape. Make sure that you have a light switch at the top of the stairs and the bottom of the stairs. If you do not have them, ask someone to add them for you. What else can I do to help prevent falls? Wear shoes that: Do not have high heels. Have rubber bottoms. Are comfortable and fit you well. Are closed at the toe. Do not wear sandals. If you use a stepladder: Make sure that it is fully opened. Do not climb a closed stepladder. Make sure that both sides of the stepladder are locked into place. Ask someone to hold it for you, if  possible. Clearly mark and make sure that you can see: Any grab bars or handrails. First and last steps. Where the edge of each step is. Use tools that help you move around (mobility aids) if they are needed. These include: Canes. Walkers. Scooters. Crutches. Turn on the lights when you go into a dark area. Replace any light bulbs as soon as they burn out. Set up your furniture so you have a clear path. Avoid moving your furniture around. If any of your floors are uneven, fix them. If there are any pets around you, be aware of where they are. Review your medicines with your doctor. Some medicines can make you feel dizzy. This can increase your chance of falling. Ask your doctor what other things that you can do to help prevent falls. This information is not intended to replace advice given to you by your health care provider. Make sure you discuss any questions you have with your health care provider. Document Released: 12/21/2008 Document Revised: 08/02/2015 Document Reviewed: 03/31/2014 Elsevier Interactive Patient Education  2017 [employer].

## 2020-12-20 NOTE — Progress Notes (Signed)
I connected with Arthor Captain today by telephone and verified that I am speaking with the correct person using two identifiers. Location patient: home Location provider: work Persons participating in the virtual visit: Rita Yoder, Glenna Durand LPN.   I discussed the limitations, risks, security and privacy concerns of performing an evaluation and management service by telephone and the availability of in person appointments. I also discussed with the patient that there may be a patient responsible charge related to this service. The patient expressed understanding and verbally consented to this telephonic visit.    Interactive audio and video telecommunications were attempted between this provider and patient, however failed, due to patient having technical difficulties OR patient did not have access to video capability.  We continued and completed visit with audio only.     Vital signs may be patient reported or missing.  Subjective:   Rita Yoder is a 80 y.o. female who presents for Medicare Annual (Subsequent) preventive examination.  Review of Systems     Cardiac Risk Factors include: advanced age (>36men, >29 women);diabetes mellitus;dyslipidemia;hypertension;obesity (BMI >30kg/m2);sedentary lifestyle     Objective:    Today's Vitals   12/20/20 1211 12/20/20 1212  Weight: 215 lb (97.5 kg)   Height: 5\' 1"  (1.549 m)   PainSc:  6    Body mass index is 40.62 kg/m.  Advanced Directives 12/20/2020 11/30/2019 11/18/2018 01/20/2018  Does Patient Have a Medical Advance Directive? Yes Yes No Yes  Type of Paramedic of [place];Living will [place];Living will - [place] in Chart? No - copy requested No - copy requested - No - copy requested  Would patient like information on creating a medical advance directive? - - No - Patient declined -    Current Medications  (verified) Outpatient Encounter Medications as of 12/20/2020  Medication Sig   acetaminophen (TYLENOL) 500 MG tablet Take 500 mg by mouth every 6 (six) hours as needed.   atorvastatin (LIPITOR) 20 MG tablet TAKE 1 TABLET BY MOUTH ONCE DAILY   Cholecalciferol (VITAMIN D3) 50 MCG (2000 UT) capsule Take by mouth. 1 per day   hydrochlorothiazide (MICROZIDE) 12.5 MG capsule TAKE 1 CAPSULE BY MOUTH  DAILY   isosorbide mononitrate (IMDUR) 30 MG 24 hr tablet TAKE 1 TABLET(30 MG) BY MOUTH DAILY   levothyroxine (SYNTHROID) 25 MCG tablet TAKE 1 TABLET(25 MCG) BY MOUTH EVERY MORNING   losartan (COZAAR) 100 MG tablet TAKE 1 TABLET BY MOUTH  DAILY   solifenacin (VESICARE) 5 MG tablet Take 5 mg by mouth daily.   Blood Glucose Calibration (OT ULTRA/FASTTK CNTRL SOLN) SOLN    MOBIC 15 MG tablet Take 15 mg by mouth daily as needed. (Patient not taking: No sig reported)   nitroGLYCERIN (NITROSTAT) 0.4 MG SL tablet DISSOLVE 1 TABLET UNDER THE TONGUE AT FIRST SIGN OF ATTACKS, MAY REPEAT EVERY 5 MINUTES UP TO 3 TABLETS, IF NO RELLIEF SEEK MEDICAL HELP (Patient not taking: No sig reported)   omega-3 acid ethyl esters (LOVAZA) 1 g capsule TAKE 2 CAPSULES BY MOUTH TWICE DAILY (Patient not taking: No sig reported)   No facility-administered encounter medications on file as of 12/20/2020.    Allergies (verified) Patient has no known allergies.   History: Past Medical History:  Diagnosis Date   Chronic kidney disease 12/2009   Diabetes mellitus 2010   Dyslipidemia    Hypertension    Monoclonal gammopathy 03/2010   Past Surgical History:  Procedure Laterality Date   CHOLECYSTECTOMY     HERNIA REPAIR     ROTATOR CUFF REPAIR  1994   THYROID SURGERY     TONSILLECTOMY     TOTAL ABDOMINAL HYSTERECTOMY     Family History  Problem Relation Age of Onset   Healthy Mother    Heart attack Brother    Cancer Maternal Aunt    Cancer Maternal Uncle    Heart Problems Maternal Grandmother    Social History    Socioeconomic History   Marital status: Married    Spouse name: Not on file   Number of children: 4   Years of education: Not on file   Highest education level: Not on file  Occupational History   Occupation: retrired  Tobacco Use   Smoking status: Never   Smokeless tobacco: Never  Vaping Use   Vaping Use: Never used  Substance and Sexual Activity   Alcohol use: No   Drug use: Never   Sexual activity: Not Currently  Other Topics Concern   Not on file  Social History Narrative   Not on file   Social Determinants of Health   Financial Resource Strain: Low Risk    Difficulty of Paying Living Expenses: Not hard at all  Food Insecurity: No Food Insecurity   Worried About Charity fundraiser in the Last Year: Never true   [place] in the Last Year: Never true  Transportation Needs: No Transportation Needs   Lack of Transportation (Medical): No   Lack of Transportation (Non-Medical): No  Physical Activity: Inactive   Days of Exercise per Week: 0 days   Minutes of Exercise per Session: 0 min  Stress: No Stress Concern Present   Feeling of Stress : Not at all  Social Connections: Not on file    Tobacco Counseling Counseling given: Not Answered   Clinical Intake:  Pre-visit preparation completed: Yes  Pain : 0-10 Pain Score: 6  Pain Type: Chronic pain Pain Location: Throat Pain Descriptors / Indicators: Throbbing Pain Onset: More than a month ago Pain Frequency: Intermittent     Nutritional Status: BMI > 30  Obese Nutritional Risks: None Diabetes: Yes  How often do you need to have someone help you when you read instructions, pamphlets, or other written materials from your doctor or pharmacy?: 1 - Never What is the last grade level you completed in school?: 11th grade  Diabetic? Yes Nutrition Risk Assessment:  Has the patient had any N/V/D within the last 2 months?  No  Does the patient have any non-healing wounds?  No  Has the patient had any  unintentional weight loss or weight gain?  No   Diabetes:  Is the patient diabetic?  Yes  If diabetic, was a CBG obtained today?  No  Did the patient bring in their glucometer from home?  No  How often do you monitor your CBG's? Does not.   Financial Strains and Diabetes Management:  Are you having any financial strains with the device, your supplies or your medication? No .  Does the patient want to be seen by Chronic Care Management for management of their diabetes?  No  Would the patient like to be referred to a Nutritionist or for Diabetic Management?  No   Diabetic Exams:  Diabetic Eye Exam: Overdue for diabetic eye exam. Pt has been advised about the importance in completing this exam. Patient advised to call and schedule an eye exam. Diabetic Foot Exam: Overdue, Pt  has been advised about the importance in completing this exam. Pt is scheduled for diabetic foot exam on next appointment.   Interpreter Needed?: No  Information entered by :: NAllen LPN   Activities of Daily Living In your present state of health, do you have any difficulty performing the following activities: 12/20/2020  Hearing? Y  Comment decreased hearing in left ear  Vision? N  Difficulty concentrating or making decisions? N  Walking or climbing stairs? Y  Dressing or bathing? N  Doing errands, shopping? N  Preparing Food and eating ? N  Using the Toilet? N  In the past six months, have you accidently leaked urine? N  Do you have problems with loss of bowel control? N  Managing your Medications? N  Managing your Finances? N  Housekeeping or managing your Housekeeping? N  Some recent data might be hidden    Patient Care Team: Glendale Chard, MD as PCP - General (Internal Medicine) [place] [place] (Pediatric Ophthalmology) Elsie Saas, MD as Consulting Physician (Orthopedic Surgery)  Indicate any recent Medical Services you may have received from other than [facility] providers in the past year  (date may be approximate).     Assessment:   This is a routine wellness examination for Rita Yoder.  Hearing/Vision screen No results found.  Dietary issues and exercise activities discussed: Current Exercise Habits: The patient does not participate in regular exercise at present   Goals Addressed             This Visit's Progress    Patient Stated       12/20/2020, wants to get hip replacement       Depression Screen PHQ 2/9 Scores 12/20/2020 11/30/2019 11/30/2019 11/18/2018 01/20/2018  PHQ - 2 Score 0 0 0 0 0  PHQ- 9 Score - - - 0 -    Fall Risk Fall Risk  12/20/2020 11/30/2019 01/06/2019 11/18/2018 01/20/2018  Falls in the past year? 0 0 0 1 1  Comment - - - 3 falls -  Number falls in past yr: - - - 1 1  Comment - - - - just fell  Injury with Fall? - - - 0 0  Risk for fall due to : Impaired balance/gait;Medication side effect Medication side effect;Impaired balance/gait - History of fall(s);Impaired balance/gait;Medication side effect History of fall(s);Medication side effect;Impaired balance/gait;Impaired mobility  Follow up Falls evaluation completed;Education provided;Falls prevention discussed Falls evaluation completed;Education provided;Falls prevention discussed - Falls evaluation completed;Falls prevention discussed -    FALL RISK PREVENTION PERTAINING TO THE HOME:  Any stairs in or around the home? No  If so, are there any without handrails?  N/a Home free of loose throw rugs in walkways, pet beds, electrical cords, etc? Yes  Adequate lighting in your home to reduce risk of falls? Yes   ASSISTIVE DEVICES UTILIZED TO PREVENT FALLS:  Life alert? Yes  Use of a cane, walker or w/c? Yes  Grab bars in the bathroom? Yes  Shower chair or bench in shower? Yes  Elevated toilet seat or a handicapped toilet? Yes   TIMED UP AND GO:  Was the test performed? No .      Cognitive Function:     6CIT Screen 12/20/2020 11/30/2019 11/18/2018 01/20/2018  What Year? 0  points 0 points 0 points 0 points  What month? 0 points 0 points 0 points 0 points  What time? 0 points 0 points 0 points 0 points  Count back from 20 0 points 2 points 0 points 0  points  Months in reverse 4 points 4 points 0 points 0 points  Repeat phrase 2 points 8 points 0 points 2 points  Total Score 6 14 0 2    Immunizations Immunization History  Administered Date(s) Administered   PFIZER(Purple Top)SARS-COV-2 Vaccination 06/04/2019, 06/28/2019, 02/20/2020    TDAP status: Up to date  Flu Vaccine status: Declined, Education has been provided regarding the importance of this vaccine but patient still declined. Advised may receive this vaccine at local pharmacy or Health Dept. Aware to provide a copy of the vaccination record if obtained from local pharmacy or Health Dept. Verbalized acceptance and understanding.  Pneumococcal vaccine status: Declined,  Education has been provided regarding the importance of this vaccine but patient still declined. Advised may receive this vaccine at local pharmacy or Health Dept. Aware to provide a copy of the vaccination record if obtained from local pharmacy or Health Dept. Verbalized acceptance and understanding.   Covid-19 vaccine status: Completed vaccines  Qualifies for Shingles Vaccine? Yes   Zostavax completed No   Shingrix Completed?: No.    Education has been provided regarding the importance of this vaccine. Patient has been advised to call insurance company to determine out of pocket expense if they have not yet received this vaccine. Advised may also receive vaccine at local pharmacy or Health Dept. Verbalized acceptance and understanding.  Screening Tests Health Maintenance  Topic Date Due   Zoster Vaccines- Shingrix (1 of 2) Never done   FOOT EXAM  02/07/2020   OPHTHALMOLOGY EXAM  02/11/2020   COVID-19 Vaccine (4 - Booster for Pfizer series) 05/14/2020   INFLUENZA VACCINE  Never done   HEMOGLOBIN A1C  02/19/2021   TETANUS/TDAP   11/11/2022   DEXA SCAN  Completed   HPV VACCINES  Aged Out    Health Maintenance  Health Maintenance Due  Topic Date Due   Zoster Vaccines- Shingrix (1 of 2) Never done   FOOT EXAM  02/07/2020   OPHTHALMOLOGY EXAM  02/11/2020   COVID-19 Vaccine (4 - Booster for Pfizer series) 05/14/2020   INFLUENZA VACCINE  Never done    Colorectal cancer screening: No longer required.   Mammogram status: Completed 07/13/2020. Repeat every year  Bone Density status: Completed 02/23/2017.  Lung Cancer Screening: (Low Dose CT Chest recommended if Age 35-80 years, 30 pack-year currently smoking OR have quit w/in 15years.) does not qualify.   Lung Cancer Screening Referral: no  Additional Screening:  Hepatitis C Screening: does not qualify;   Vision Screening: Recommended annual ophthalmology exams for early detection of glaucoma and other disorders of the eye. Is the patient up to date with their annual eye exam?  No  Who is the provider or what is the name of the office in which the patient attends annual eye exams? Dr. Clifton James If pt is not established with a provider, would they like to be referred to a provider to establish care? No .   Dental Screening: Recommended annual dental exams for proper oral hygiene  Community Resource Referral / Chronic Care Management: CRR required this visit?  No   CCM required this visit?  No      Plan:     I have personally reviewed and noted the following in the patient's chart:   Medical and social history Use of alcohol, tobacco or illicit drugs  Current medications and supplements including opioid prescriptions.  Functional ability and status Nutritional status Physical activity Advanced directives List of other physicians Hospitalizations, surgeries, and ER visits  in previous 12 months Vitals Screenings to include cognitive, depression, and falls Referrals and appointments  In addition, I have reviewed and discussed with patient  certain preventive protocols, quality metrics, and best practice recommendations. A written personalized care plan for preventive services as well as general preventive health recommendations were provided to patient.     Kellie Simmering, LPN   [id number]   Nurse Notes:

## 2020-12-23 ENCOUNTER — Encounter: Payer: Self-pay | Admitting: Internal Medicine

## 2021-01-07 ENCOUNTER — Encounter: Payer: Self-pay | Admitting: Internal Medicine

## 2021-01-07 DIAGNOSIS — N189 Chronic kidney disease, unspecified: Secondary | ICD-10-CM | POA: Diagnosis not present

## 2021-01-07 DIAGNOSIS — M25559 Pain in unspecified hip: Secondary | ICD-10-CM | POA: Diagnosis not present

## 2021-01-07 DIAGNOSIS — E1122 Type 2 diabetes mellitus with diabetic chronic kidney disease: Secondary | ICD-10-CM | POA: Diagnosis not present

## 2021-01-07 DIAGNOSIS — N182 Chronic kidney disease, stage 2 (mild): Secondary | ICD-10-CM | POA: Diagnosis not present

## 2021-01-07 DIAGNOSIS — I129 Hypertensive chronic kidney disease with stage 1 through stage 4 chronic kidney disease, or unspecified chronic kidney disease: Secondary | ICD-10-CM | POA: Diagnosis not present

## 2021-01-17 ENCOUNTER — Other Ambulatory Visit: Payer: Self-pay | Admitting: Internal Medicine

## 2021-01-24 ENCOUNTER — Other Ambulatory Visit: Payer: Self-pay | Admitting: Internal Medicine

## 2021-02-18 DIAGNOSIS — N182 Chronic kidney disease, stage 2 (mild): Secondary | ICD-10-CM | POA: Diagnosis not present

## 2021-02-27 ENCOUNTER — Encounter: Payer: Self-pay | Admitting: Nurse Practitioner

## 2021-02-28 ENCOUNTER — Ambulatory Visit (INDEPENDENT_AMBULATORY_CARE_PROVIDER_SITE_OTHER): Payer: Medicare Other | Admitting: Internal Medicine

## 2021-02-28 ENCOUNTER — Encounter: Payer: Self-pay | Admitting: Internal Medicine

## 2021-02-28 ENCOUNTER — Other Ambulatory Visit: Payer: Self-pay

## 2021-02-28 VITALS — BP 136/80 | HR 54 | Temp 97.7°F | Ht 61.0 in | Wt 213.2 lb

## 2021-02-28 DIAGNOSIS — Z0000 Encounter for general adult medical examination without abnormal findings: Secondary | ICD-10-CM | POA: Diagnosis not present

## 2021-02-28 DIAGNOSIS — E039 Hypothyroidism, unspecified: Secondary | ICD-10-CM | POA: Diagnosis not present

## 2021-02-28 DIAGNOSIS — M1612 Unilateral primary osteoarthritis, left hip: Secondary | ICD-10-CM

## 2021-02-28 DIAGNOSIS — N184 Chronic kidney disease, stage 4 (severe): Secondary | ICD-10-CM | POA: Diagnosis not present

## 2021-02-28 DIAGNOSIS — I129 Hypertensive chronic kidney disease with stage 1 through stage 4 chronic kidney disease, or unspecified chronic kidney disease: Secondary | ICD-10-CM | POA: Diagnosis not present

## 2021-02-28 DIAGNOSIS — Z6841 Body Mass Index (BMI) 40.0 and over, adult: Secondary | ICD-10-CM

## 2021-02-28 DIAGNOSIS — Z2821 Immunization not carried out because of patient refusal: Secondary | ICD-10-CM | POA: Diagnosis not present

## 2021-02-28 DIAGNOSIS — H6123 Impacted cerumen, bilateral: Secondary | ICD-10-CM

## 2021-02-28 DIAGNOSIS — E1142 Type 2 diabetes mellitus with diabetic polyneuropathy: Secondary | ICD-10-CM

## 2021-02-28 NOTE — Patient Instructions (Signed)
Health Maintenance After Age 81 After age 81, you are at a higher risk for certain long-term diseases and infections as well as injuries from falls. Falls are a major cause of broken bones and head injuries in people who are older than age 81. Getting regular preventive care can help to keep you healthy and well. Preventive care includes getting regular testing and making lifestyle changes as recommended by your health care provider. Talk with your health care provider about: Which screenings and tests you should have. A screening is a test that checks for a disease when you have no symptoms. A diet and exercise plan that is right for you. What should I know about screenings and tests to prevent falls? Screening and testing are the best ways to find a health problem early. Early diagnosis and treatment give you the best chance of managing medical conditions that are common after age 81. Certain conditions and lifestyle choices may make you more likely to have a fall. Your health care provider may recommend: Regular vision checks. Poor vision and conditions such as cataracts can make you more likely to have a fall. If you wear glasses, make sure to get your prescription updated if your vision changes. Medicine review. Work with your health care provider to regularly review all of the medicines you are taking, including over-the-counter medicines. Ask your health care provider about any side effects that may make you more likely to have a fall. Tell your health care provider if any medicines that you take make you feel dizzy or sleepy. Strength and balance checks. Your health care provider may recommend certain tests to check your strength and balance while standing, walking, or changing positions. Foot health exam. Foot pain and numbness, as well as not wearing proper footwear, can make you more likely to have a fall. Screenings, including: Osteoporosis screening. Osteoporosis is a condition that causes  the bones to get weaker and break more easily. Blood pressure screening. Blood pressure changes and medicines to control blood pressure can make you feel dizzy. Depression screening. You may be more likely to have a fall if you have a fear of falling, feel depressed, or feel unable to do activities that you used to do. Alcohol use screening. Using too much alcohol can affect your balance and may make you more likely to have a fall. Follow these instructions at home: Lifestyle Do not drink alcohol if: Your health care provider tells you not to drink. If you drink alcohol: Limit how much you have to: 0-1 drink a day for women. 0-2 drinks a day for men. Know how much alcohol is in your drink. In the [place] one drink equals one 12 oz bottle of beer (355 mL), one 5 oz glass of wine (148 mL), or one 1 oz glass of hard liquor (44 mL). Do not use any products that contain nicotine or tobacco. These products include cigarettes, chewing tobacco, and vaping devices, such as e-cigarettes. If you need help quitting, ask your health care provider. Activity  Follow a regular exercise program to stay fit. This will help you maintain your balance. Ask your health care provider what types of exercise are appropriate for you. If you need a cane or walker, use it as recommended by your health care provider. Wear supportive shoes that have nonskid soles. Safety  Remove any tripping hazards, such as rugs, cords, and clutter. Install safety equipment such as grab bars in bathrooms and safety rails on stairs. Keep rooms and walkways   well-lit. General instructions Talk with your health care provider about your risks for falling. Tell your health care provider if: You fall. Be sure to tell your health care provider about all falls, even ones that seem minor. You feel dizzy, tiredness (fatigue), or off-balance. Take over-the-counter and prescription medicines only as told by your health care provider. These include  supplements. Eat a healthy diet and maintain a healthy weight. A healthy diet includes low-fat dairy products, low-fat (lean) meats, and fiber from whole grains, beans, and lots of fruits and vegetables. Stay current with your vaccines. Schedule regular health, dental, and eye exams. Summary Having a healthy lifestyle and getting preventive care can help to protect your health and wellness after age 81. Screening and testing are the best way to find a health problem early and help you avoid having a fall. Early diagnosis and treatment give you the best chance for managing medical conditions that are more common for people who are older than age 81. Falls are a major cause of broken bones and head injuries in people who are older than age 81. Take precautions to prevent a fall at home. Work with your health care provider to learn what changes you can make to improve your health and wellness and to prevent falls. This information is not intended to replace advice given to you by your health care provider. Make sure you discuss any questions you have with your health care provider. Document Revised: 07/16/2020 Document Reviewed: 07/16/2020 Elsevier Patient Education  2022 [employer]  

## 2021-02-28 NOTE — Progress Notes (Signed)
I,Rita Yoder,acting as a Education administrator for Rita Greenland, MD.,have documented all relevant documentation on the behalf of Rita Greenland, MD,as directed by  Rita Greenland, MD while in the presence of Rita Greenland, MD.  This visit occurred during the SARS-CoV-2 public health emergency.  Safety protocols were in place, including screening questions prior to the visit, additional usage of staff PPE, and extensive cleaning of exam room while observing appropriate contact time as indicated for disinfecting solutions.  Subjective:     Patient ID: Rita Yoder , female    DOB: [birth year] , 80 y.o.   MRN: [id number]   Chief Complaint  Patient presents with   Annual Exam   Diabetes   Hypertension    HPI  She is here today for a full physical exam. She reports compliance with meds. She has been able to control her diabetes with dietary changes. She denies headaches, chest pain and shortness of breath. She has no specific concerns or complaints at this time.   Diabetes She presents for her follow-up diabetic visit. She has type 2 diabetes mellitus. Her disease course has been stable. There are no hypoglycemic associated symptoms. There are no diabetic associated symptoms. Diabetic complications include nephropathy. Risk factors for coronary artery disease include post-menopausal, sedentary lifestyle and hypertension. Current diabetic treatment includes diet.  Hypertension This is a chronic problem. The current episode started more than 1 year ago. The problem has been gradually improving since onset. The problem is controlled. The current treatment provides moderate improvement. Hypertensive end-organ damage includes kidney disease.    Past Medical History:  Diagnosis Date   Chronic kidney disease 12/2009   Diabetes mellitus 2010   Dyslipidemia    Hypertension    Monoclonal gammopathy 03/2010     Family History  Problem Relation Age of Onset   Healthy Mother    Heart attack  Brother    Cancer Maternal Aunt    Cancer Maternal Uncle    Heart Problems Maternal Grandmother      Current Outpatient Medications:    acetaminophen (TYLENOL) 500 MG tablet, Take 500 mg by mouth every 6 (six) hours as needed., Disp: , Rfl:    atorvastatin (LIPITOR) 20 MG tablet, TAKE 1 TABLET BY MOUTH ONCE DAILY, Disp: 90 tablet, Rfl: 3   hydrochlorothiazide (MICROZIDE) 12.5 MG capsule, TAKE 1 CAPSULE BY MOUTH  DAILY, Disp: 90 capsule, Rfl: 3   isosorbide mononitrate (IMDUR) 30 MG 24 hr tablet, TAKE 1 TABLET(30 MG) BY MOUTH DAILY, Disp: 90 tablet, Rfl: 1   nitroGLYCERIN (NITROSTAT) 0.4 MG SL tablet, DISSOLVE 1 TABLET UNDER THE TONGUE AT FIRST SIGN OF ATTACKS, MAY REPEAT EVERY 5 MINUTES UP TO 3 TABLETS, IF NO RELLIEF SEEK MEDICAL HELP, Disp: 25 tablet, Rfl: 1   solifenacin (VESICARE) 5 MG tablet, Take 5 mg by mouth daily., Disp: , Rfl:    Blood Glucose Calibration (OT ULTRA/FASTTK CNTRL SOLN) SOLN, , Disp: , Rfl:    Cholecalciferol (VITAMIN D3) 50 MCG (2000 UT) capsule, Take by mouth. 1 per day, Disp: , Rfl:    levothyroxine (SYNTHROID) 25 MCG tablet, TAKE 1 TABLET(25 MCG) BY MOUTH EVERY MORNING, Disp: 90 tablet, Rfl: 0   No Known Allergies    The patient states she uses post menopausal status for birth control. Last LMP was No LMP recorded.. Negative for Dysmenorrhea. Negative for: breast discharge, breast lump(s), breast pain and breast self exam. Associated symptoms include abnormal vaginal bleeding. Pertinent negatives include abnormal bleeding (hematology), anxiety, decreased  libido, depression, difficulty falling sleep, dyspareunia, history of infertility, nocturia, sexual dysfunction, sleep disturbances, urinary incontinence, urinary urgency, vaginal discharge and vaginal itching. Diet regular.The patient states her exercise level is  minimal.  . The patient's tobacco use is:  Social History   Tobacco Use  Smoking Status Never  Smokeless Tobacco Never  . She has been exposed to  passive smoke. The patient's alcohol use is:  Social History   Substance and Sexual Activity  Alcohol Use No   Review of Systems  Constitutional: Negative.   HENT: Negative.    Eyes: Negative.   Respiratory: Negative.    Cardiovascular: Negative.   Gastrointestinal: Negative.   Endocrine: Negative.   Genitourinary: Negative.   Musculoskeletal:  Positive for arthralgias.       She has chronic hip pain. States Ortho requires her to lose weight prior to surgery. States her goal is less than 200 lbs.   Skin: Negative.   Allergic/Immunologic: Negative.   Neurological: Negative.   Hematological: Negative.   Psychiatric/Behavioral: Negative.      Today's Vitals   02/28/21 0958 02/28/21 1024  BP: (!) 144/76 136/80  Pulse: (!) 54   Temp: 97.7 F (36.5 C)   Weight: 213 lb 3.2 oz (96.7 kg)   Height: _0  (1.549 m)   PainSc: 0-No pain    Body mass index is 40.28 kg/m.  Wt Readings from Last 3 Encounters:  02/28/21 213 lb 3.2 oz (96.7 kg)  12/20/20 215 lb (97.5 kg)  09/04/20 220 lb 9.6 oz (100.1 kg)    BP Readings from Last 3 Encounters:  02/28/21 136/80  09/04/20 126/80  08/20/20 128/76    Objective:  Physical Exam Vitals and nursing note reviewed.  Constitutional:      Appearance: Normal appearance.  HENT:     Head: Normocephalic and atraumatic.     Right Ear: Ear canal and external ear normal. There is impacted cerumen.     Left Ear: Ear canal and external ear normal. There is impacted cerumen.     Nose:     Comments: Masked     Mouth/Throat:     Comments: Masked  Eyes:     Extraocular Movements: Extraocular movements intact.     Conjunctiva/sclera: Conjunctivae normal.     Pupils: Pupils are equal, round, and reactive to light.  Cardiovascular:     Rate and Rhythm: Normal rate and regular rhythm.     Pulses:          Dorsalis pedis pulses are 1+ on the right side and 1+ on the left side.     Heart sounds: Normal heart sounds.  Pulmonary:     Effort:  Pulmonary effort is normal.     Breath sounds: Normal breath sounds.  Chest:  Breasts:    Tanner Score is 5.     Right: Normal.     Left: Normal.  Abdominal:     General: Bowel sounds are normal.     Palpations: Abdomen is soft.     Comments: Soft, obese. Difficult to assess organomegaly due to body habitus  Genitourinary:    Comments: deferred Musculoskeletal:        General: Normal range of motion.     Cervical back: Normal range of motion and neck supple.  Feet:     Right foot:     Protective Sensation: 5 sites tested.  5 sites sensed.     Skin integrity: Dry skin present.     Toenail Condition: Right  toenails are long.     Left foot:     Protective Sensation: 5 sites tested.  5 sites sensed.     Skin integrity: Callus and dry skin present.     Toenail Condition: Left toenails are long.  Skin:    General: Skin is warm and dry.  Neurological:     General: No focal deficit present.     Mental Status: She is alert and oriented to person, place, and time.  Psychiatric:        Mood and Affect: Mood normal.        Behavior: Behavior normal.        Assessment And Plan:     1. Routine general medical examination at health care facility Comments: A full exam was performed. Importance of monthly self breast exams was discussed with the patient. PATIENT IS ADVISED TO GET 30-45 MINUTES REGULAR EXERCISE NO LESS THAN FOUR TO FIVE DAYS PER WEEK - BOTH WEIGHTBEARING EXERCISES AND AEROBIC ARE RECOMMENDED.  PATIENT IS ADVISED TO FOLLOW A HEALTHY DIET WITH AT LEAST SIX FRUITS/VEGGIES PER DAY, DECREASE INTAKE OF RED MEAT, AND TO INCREASE FISH INTAKE TO TWO DAYS PER WEEK.  MEATS/FISH SHOULD NOT BE FRIED, BAKED OR BROILED IS PREFERABLE.  IT IS ALSO IMPORTANT TO CUT BACK ON YOUR SUGAR INTAKE. PLEASE AVOID ANYTHING WITH ADDED SUGAR, CORN SYRUP OR OTHER SWEETENERS. IF YOU MUST USE A SWEETENER, YOU CAN TRY STEVIA. IT IS ALSO IMPORTANT TO AVOID ARTIFICIALLY SWEETENERS AND DIET BEVERAGES. LASTLY, I  SUGGEST WEARING SPF 50 SUNSCREEN ON EXPOSED PARTS AND ESPECIALLY WHEN IN THE DIRECT SUNLIGHT FOR AN EXTENDED PERIOD OF TIME.  PLEASE AVOID FAST FOOD RESTAURANTS AND INCREASE YOUR WATER INTAKE.  2. Diabetic polyneuropathy associated with type 2 diabetes mellitus ([place]) Comments: Diabetic foot exam was performed. She will rto in 4-6 months for re-evaluation. I DISCUSSED WITH THE PATIENT AT LENGTH REGARDING THE GOALS OF GLYCEMIC CONTROL AND POSSIBLE LONG-TERM COMPLICATIONS.  I  ALSO STRESSED THE IMPORTANCE OF COMPLIANCE WITH HOME GLUCOSE MONITORING, DIETARY RESTRICTIONS INCLUDING AVOIDANCE OF SUGARY DRINKS/PROCESSED FOODS,  ALONG WITH REGULAR EXERCISE.  I  ALSO STRESSED THE IMPORTANCE OF ANNUAL EYE EXAMS, SELF FOOT CARE AND COMPLIANCE WITH OFFICE VISITS.  - POCT Urinalysis Dipstick (81002) - POCT UA - Microalbumin - Hemoglobin A1c - CBC - CMP14+EGFR - Lipid panel  3. Parenchymal renal hypertension, stage 1 through stage 4 or unspecified chronic kidney disease Comments: Chronic, fair control. Goal BP < 130/80. She is encouraged to follow low sodium diet. EKG performed, SB w/ frequent PACs. She will rto in 4-6 months for re-evaluation. She is also followed by Renal. - EKG 12-Lead - CBC - CMP14+EGFR - Lipid panel - Protein electrophoresis, serum  4. Chronic kidney disease, stage IV (severe) ([facility]) Comments: Chronic, Renal input is appreciated. She is encouraged to stay hydrated, keep BP at goal and avoid NSAIDs.   5. Primary hypothyroidism Comments: I will check thyroid panel and adjust meds as needed. She will rto in six months for re-evaluation. - TSH + free T4  6. Bilateral impacted cerumen AFTER OBTAINING VERBAL CONSENT, BOTH EARS WERE FLUSHED BY IRRIGATION. SHE TOLERATED PROCEDURE WELL WITHOUT ANY COMPLICATIONS. NO TM ABNORMALITIES WERE NOTED. - Ear Lavage  7. Primary osteoarthritis of left hip Comments: Chronic, she has been evaluated by Ortho. States she must lose another 20 lbs prior  to surgery. Reminded to limit pain meds to Tylenol prn.   8. Class 3 severe obesity due to excess calories with serious comorbidity  and body mass index (BMI) of 50.0 to 59.9 in adult [facility]) Comments: She is encouraged to initially strive for BMI less than 45 to decrease cardiac risk. She is reminded that chair exercises count as exercise.   9. Pneumococcal vaccination declined  10. Varicella zoster virus (VZV) vaccination declined  Patient was given opportunity to ask questions. Patient verbalized understanding of the plan and was able to repeat key elements of the plan. All questions were answered to their satisfaction.   I, Rita Greenland, MD, have reviewed all documentation for this visit. The documentation on 03/16/21 for the exam, diagnosis, procedures, and orders are all accurate and complete.   THE PATIENT IS ENCOURAGED TO PRACTICE SOCIAL DISTANCING DUE TO THE COVID-19 PANDEMIC.

## 2021-03-03 ENCOUNTER — Other Ambulatory Visit: Payer: Self-pay | Admitting: Internal Medicine

## 2021-03-05 ENCOUNTER — Other Ambulatory Visit: Payer: Self-pay | Admitting: Internal Medicine

## 2021-03-05 DIAGNOSIS — R778 Other specified abnormalities of plasma proteins: Secondary | ICD-10-CM

## 2021-03-05 DIAGNOSIS — N184 Chronic kidney disease, stage 4 (severe): Secondary | ICD-10-CM

## 2021-03-05 LAB — LIPID PANEL
Chol/HDL Ratio: 3.4 ratio (ref 0.0–4.4)
Cholesterol, Total: 162 mg/dL (ref 100–199)
HDL: 48 mg/dL (ref >39)
LDL Chol Calc (NIH): 91 mg/dL (ref 0–99)
Triglycerides: 131 mg/dL (ref 0–149)
VLDL Cholesterol Cal: 23 mg/dL (ref 5–40)

## 2021-03-05 LAB — CMP14+EGFR
ALT: 11 IU/L (ref 0–32)
AST: 14 IU/L (ref 0–40)
Albumin/Globulin Ratio: 1.8 (ref 1.2–2.2)
Albumin: 4 g/dL (ref 3.6–4.6)
Alkaline Phosphatase: 92 IU/L (ref 44–121)
BUN/Creatinine Ratio: 16 (ref 12–28)
BUN: 44 mg/dL — ABNORMAL HIGH (ref 8–27)
Bilirubin Total: 0.3 mg/dL (ref 0.0–1.2)
CO2: 15 mmol/L — ABNORMAL LOW (ref 20–29)
Calcium: 9.1 mg/dL (ref 8.7–10.3)
Chloride: 108 mmol/L — ABNORMAL HIGH (ref 96–106)
Creatinine, Ser: 2.76 mg/dL — ABNORMAL HIGH (ref 0.57–1.00)
Globulin, Total: 2.2 g/dL (ref 1.5–4.5)
Glucose: 90 mg/dL (ref 70–99)
Potassium: 4.7 mmol/L (ref 3.5–5.2)
Sodium: 142 mmol/L (ref 134–144)
Total Protein: 6.2 g/dL (ref 6.0–8.5)
eGFR: 17 mL/min/{1.73_m2} — ABNORMAL LOW (ref >59)

## 2021-03-05 LAB — TSH+FREE T4
Free T4: 1.11 ng/dL (ref 0.82–1.77)
TSH: 6.41 u[IU]/mL — ABNORMAL HIGH (ref 0.450–4.500)

## 2021-03-05 LAB — HEMOGLOBIN A1C
Est. average glucose Bld gHb Est-mCnc: 94 mg/dL
Hgb A1c MFr Bld: 4.9 % (ref 4.8–5.6)

## 2021-03-05 LAB — CBC
Hematocrit: 32.5 % — ABNORMAL LOW (ref 34.0–46.6)
Hemoglobin: 10.6 g/dL — ABNORMAL LOW (ref 11.1–15.9)
MCH: 31.6 pg (ref 26.6–33.0)
MCHC: 32.6 g/dL (ref 31.5–35.7)
MCV: 97 fL (ref 79–97)
Platelets: 159 10*3/uL (ref 150–450)
RBC: 3.35 x10E6/uL — ABNORMAL LOW (ref 3.77–5.28)
RDW: 11.5 % — ABNORMAL LOW (ref 11.7–15.4)
WBC: 7.6 10*3/uL (ref 3.4–10.8)

## 2021-03-05 LAB — PROTEIN ELECTROPHORESIS, SERUM
A/G Ratio: 1.4 (ref 0.7–1.7)
Albumin ELP: 3.6 g/dL (ref 2.9–4.4)
Alpha 1: 0.2 g/dL (ref 0.0–0.4)
Alpha 2: 0.8 g/dL (ref 0.4–1.0)
Beta: 0.8 g/dL (ref 0.7–1.3)
Gamma Globulin: 0.8 g/dL (ref 0.4–1.8)
Globulin, Total: 2.6 g/dL (ref 2.2–3.9)
M-Spike, %: 0.2 g/dL — ABNORMAL HIGH

## 2021-03-07 ENCOUNTER — Telehealth: Payer: Self-pay | Admitting: Hematology

## 2021-03-07 NOTE — Telephone Encounter (Signed)
Scheduled appt per 12/27 referral. Pt is aware of appt date and time. She is also aware to arrive 15 mins prior to her appt time.

## 2021-03-25 ENCOUNTER — Other Ambulatory Visit: Payer: Self-pay

## 2021-03-25 ENCOUNTER — Inpatient Hospital Stay: Payer: Medicare Other

## 2021-03-25 ENCOUNTER — Inpatient Hospital Stay: Payer: Medicare Other | Attending: Hematology | Admitting: Hematology

## 2021-03-25 VITALS — BP 159/77 | HR 75 | Temp 97.9°F | Resp 17 | Ht 61.0 in | Wt 214.3 lb

## 2021-03-25 DIAGNOSIS — D649 Anemia, unspecified: Secondary | ICD-10-CM | POA: Diagnosis not present

## 2021-03-25 DIAGNOSIS — D472 Monoclonal gammopathy: Secondary | ICD-10-CM | POA: Diagnosis not present

## 2021-03-25 DIAGNOSIS — E1142 Type 2 diabetes mellitus with diabetic polyneuropathy: Secondary | ICD-10-CM | POA: Insufficient documentation

## 2021-03-25 DIAGNOSIS — I129 Hypertensive chronic kidney disease with stage 1 through stage 4 chronic kidney disease, or unspecified chronic kidney disease: Secondary | ICD-10-CM | POA: Diagnosis not present

## 2021-03-25 DIAGNOSIS — E785 Hyperlipidemia, unspecified: Secondary | ICD-10-CM | POA: Insufficient documentation

## 2021-03-25 DIAGNOSIS — M1990 Unspecified osteoarthritis, unspecified site: Secondary | ICD-10-CM | POA: Diagnosis not present

## 2021-03-25 DIAGNOSIS — E039 Hypothyroidism, unspecified: Secondary | ICD-10-CM | POA: Insufficient documentation

## 2021-03-25 DIAGNOSIS — E1122 Type 2 diabetes mellitus with diabetic chronic kidney disease: Secondary | ICD-10-CM | POA: Diagnosis not present

## 2021-03-25 DIAGNOSIS — N184 Chronic kidney disease, stage 4 (severe): Secondary | ICD-10-CM | POA: Diagnosis not present

## 2021-03-25 LAB — CBC WITH DIFFERENTIAL/PLATELET
Abs Immature Granulocytes: 0.03 10*3/uL (ref 0.00–0.07)
Basophils Absolute: 0 10*3/uL (ref 0.0–0.1)
Basophils Relative: 0 %
Eosinophils Absolute: 0.2 10*3/uL (ref 0.0–0.5)
Eosinophils Relative: 2 %
HCT: 32.2 % — ABNORMAL LOW (ref 36.0–46.0)
Hemoglobin: 10.3 g/dL — ABNORMAL LOW (ref 12.0–15.0)
Immature Granulocytes: 0 %
Lymphocytes Relative: 29 %
Lymphs Abs: 2.2 10*3/uL (ref 0.7–4.0)
MCH: 32.4 pg (ref 26.0–34.0)
MCHC: 32 g/dL (ref 30.0–36.0)
MCV: 101.3 fL — ABNORMAL HIGH (ref 80.0–100.0)
Monocytes Absolute: 0.4 10*3/uL (ref 0.1–1.0)
Monocytes Relative: 5 %
Neutro Abs: 4.7 10*3/uL (ref 1.7–7.7)
Neutrophils Relative %: 64 %
Platelets: 214 10*3/uL (ref 150–400)
RBC: 3.18 MIL/uL — ABNORMAL LOW (ref 3.87–5.11)
RDW: 13.1 % (ref 11.5–15.5)
WBC: 7.5 10*3/uL (ref 4.0–10.5)
nRBC: 0 % (ref 0.0–0.2)

## 2021-03-25 LAB — SEDIMENTATION RATE: Sed Rate: 20 mm/hr (ref 0–22)

## 2021-03-25 LAB — CMP (CANCER CENTER ONLY)
ALT: 12 U/L (ref 0–44)
AST: 14 U/L — ABNORMAL LOW (ref 15–41)
Albumin: 4 g/dL (ref 3.5–5.0)
Alkaline Phosphatase: 80 U/L (ref 38–126)
Anion gap: 9 (ref 5–15)
BUN: 53 mg/dL — ABNORMAL HIGH (ref 8–23)
CO2: 21 mmol/L — ABNORMAL LOW (ref 22–32)
Calcium: 9.3 mg/dL (ref 8.9–10.3)
Chloride: 115 mmol/L — ABNORMAL HIGH (ref 98–111)
Creatinine: 2.85 mg/dL — ABNORMAL HIGH (ref 0.44–1.00)
GFR, Estimated: 16 mL/min — ABNORMAL LOW (ref >60)
Glucose, Bld: 99 mg/dL (ref 70–99)
Potassium: 4.8 mmol/L (ref 3.5–5.1)
Sodium: 145 mmol/L (ref 135–145)
Total Bilirubin: 0.5 mg/dL (ref 0.3–1.2)
Total Protein: 6.8 g/dL (ref 6.5–8.1)

## 2021-03-25 LAB — IRON AND IRON BINDING CAPACITY (CC-WL,HP ONLY)
Iron: 110 ug/dL (ref 28–170)
Saturation Ratios: 44 % — ABNORMAL HIGH (ref 10.4–31.8)
TIBC: 251 ug/dL (ref 250–450)
UIBC: 141 ug/dL — ABNORMAL LOW (ref 148–442)

## 2021-03-25 LAB — FERRITIN: Ferritin: 695 ng/mL — ABNORMAL HIGH (ref 11–307)

## 2021-03-25 NOTE — Progress Notes (Addendum)
[facility]   HEMATOLOGY/ONCOLOGY CONSULTATION NOTE  Date of Service: 03/25/2021  Patient Care Team: Glendale Chard, MD as PCP - General (Internal Medicine) [place], [place] (Pediatric Ophthalmology) Elsie Saas, MD as Consulting Physician (Orthopedic Surgery)  CHIEF COMPLAINTS/PURPOSE OF CONSULTATION:  Abnormal SPEP  HISTORY OF PRESENTING ILLNESS:   Rita Yoder is a wonderful 81 y.o. female who has been referred to [place] by Dr .Glendale Chard, MD for evaluation and management of abnormal SPEP.  Patient has a history of hypertension, dyslipidemia, diabetes, diabetic polyneuropathy, chronic kidney disease stage IV, hypothyroidism, osteoarthritis, morbid obesity and a previous history of monoclonal gammopathy of undetermined significance noted in 2012.  Patient recently was seen by her primary care physician and an SPEP was sent as a part of work-up of her chronic kidney disease and she was noted to have an M spike of 0.2 g/dL.  IFE not available. She was sent to [place] for further evaluation of her monoclonal paraproteinemia. Her recent CBC from 02/28/2021 showed chronic anemia with a hemoglobin of 10.6 MCV 97 with a normal WBC count of 7.6k normal platelet count of 159k. CMP showed creatinine of 2.76 up from 2.09 in June 2022.  CO2 15, potassium of 4.7.  Patient notes no focal new bone pains.  He does have some chronic joint pains from osteoarthritis. She notes her diabetes is generally well controlled and so is her blood pressure. She does not recollect if she has seen a nephrologist in the past.  No fevers no chills no night sweats no unexpected weight loss. No Acute new focal symptoms. 1 MEDICAL HISTORY:  Past Medical History:  Diagnosis Date   Chronic kidney disease 12/2009   Diabetes mellitus 2010   Dyslipidemia    Hypertension    Monoclonal gammopathy 03/2010    SURGICAL HISTORY: Past Surgical History:  Procedure Laterality Date   CATARACT EXTRACTION Bilateral 12/2020    CHOLECYSTECTOMY     HERNIA REPAIR     ROTATOR CUFF REPAIR  03/10/1992   THYROID SURGERY     TONSILLECTOMY     TOTAL ABDOMINAL HYSTERECTOMY      SOCIAL HISTORY: Social History   Socioeconomic History   Marital status: Married    Spouse name: Not on file   Number of children: 4   Years of education: Not on file   Highest education level: Not on file  Occupational History   Occupation: retrired  Tobacco Use   Smoking status: Never   Smokeless tobacco: Never  Vaping Use   Vaping Use: Never used  Substance and Sexual Activity   Alcohol use: No   Drug use: Never   Sexual activity: Not Currently  Other Topics Concern   Not on file  Social History Narrative   Not on file   Social Determinants of Health   Financial Resource Strain: Low Risk    Difficulty of Paying Living Expenses: Not hard at all  Food Insecurity: No Food Insecurity   Worried About Charity fundraiser in the Last Year: Never true   Arboriculturist in the Last Year: Never true  Transportation Needs: No Transportation Needs   Lack of Transportation (Medical): No   Lack of Transportation (Non-Medical): No  Physical Activity: Inactive   Days of Exercise per Week: 0 days   Minutes of Exercise per Session: 0 min  Stress: No Stress Concern Present   Feeling of Stress : Not at all  Social Connections: Not on file  Intimate Partner Violence: Not on  file    FAMILY HISTORY: Family History  Problem Relation Age of Onset   Healthy Mother    Heart attack Brother    Cancer Maternal Aunt    Cancer Maternal Uncle    Heart Problems Maternal Grandmother     ALLERGIES:  has No Known Allergies.  MEDICATIONS:  Current Outpatient Medications  Medication Sig Dispense Refill   acetaminophen (TYLENOL) 500 MG tablet Take 500 mg by mouth every 6 (six) hours as needed.     atorvastatin (LIPITOR) 20 MG tablet TAKE 1 TABLET BY MOUTH ONCE DAILY 90 tablet 3   Blood Glucose Calibration (OT ULTRA/FASTTK CNTRL SOLN) SOLN       Cholecalciferol (VITAMIN D3) 50 MCG (2000 UT) capsule Take by mouth. 1 per day     hydrochlorothiazide (MICROZIDE) 12.5 MG capsule TAKE 1 CAPSULE BY MOUTH  DAILY 90 capsule 3   isosorbide mononitrate (IMDUR) 30 MG 24 hr tablet TAKE 1 TABLET(30 MG) BY MOUTH DAILY 90 tablet 1   levothyroxine (SYNTHROID) 25 MCG tablet TAKE 1 TABLET(25 MCG) BY MOUTH EVERY MORNING 90 tablet 0   nitroGLYCERIN (NITROSTAT) 0.4 MG SL tablet DISSOLVE 1 TABLET UNDER THE TONGUE AT FIRST SIGN OF ATTACKS, MAY REPEAT EVERY 5 MINUTES UP TO 3 TABLETS, IF NO RELLIEF SEEK MEDICAL HELP 25 tablet 1   solifenacin (VESICARE) 5 MG tablet Take 5 mg by mouth daily.     No current facility-administered medications for this visit.    REVIEW OF SYSTEMS:    10 Point review of Systems was done is negative except as noted above.  PHYSICAL EXAMINATION: ECOG PERFORMANCE STATUS: 2  . Vitals:   03/25/21 1258  BP: (!) 159/77  Pulse: 75  Resp: 17  Temp: 97.9 F (36.6 C)  SpO2: 100%   Filed Weights   03/25/21 1258  Weight: 214 lb 4.8 oz (97.2 kg)   .Body mass index is 40.49 kg/m.  GENERAL:alert, in no acute distress and comfortable SKIN: no acute rashes, no significant lesions EYES: conjunctiva are pink and non-injected, sclera anicteric OROPHARYNX: MMM, no exudates, no oropharyngeal erythema or ulceration NECK: supple, no JVD LYMPH:  no palpable lymphadenopathy in the cervical, axillary or inguinal regions LUNGS: clear to auscultation b/l with normal respiratory effort HEART: regular rate & rhythm ABDOMEN:  normoactive bowel sounds , non tender, not distended. Extremity: no pedal edema PSYCH: alert & oriented x 3 with fluent speech NEURO: no focal motor/sensory deficits  LABORATORY DATA:  I have reviewed the data as listed  . CBC Latest Ref Rng & Units 03/25/2021 02/28/2021 02/20/2020  WBC 4.0 - 10.5 K/uL 7.5 7.6 7.8  Hemoglobin 12.0 - 15.0 g/dL 10.3(L) 10.6(L) 10.4(L)  Hematocrit 36.0 - 46.0 % 32.2(L) 32.5(L)  31.0(L)  Platelets 150 - 400 K/uL 214 159 187    . CMP Latest Ref Rng & Units 03/25/2021 02/28/2021 09/04/2020  Glucose 70 - 99 mg/dL 99 90 116(H)  BUN 8 - 23 mg/dL 53(H) 44(H) 46(H)  Creatinine 0.44 - 1.00 mg/dL 2.85(H) 2.76(H) 2.09(H)  Sodium 135 - 145 mmol/L 145 142 145(H)  Potassium 3.5 - 5.1 mmol/L 4.8 4.7 5.2  Chloride 98 - 111 mmol/L 115(H) 108(H) 111(H)  CO2 22 - 32 mmol/L 21(L) 15(L) 18(L)  Calcium 8.9 - 10.3 mg/dL 9.3 9.1 9.3  Total Protein 6.5 - 8.1 g/dL 6.8 6.2 -  Total Bilirubin 0.3 - 1.2 mg/dL 0.5 0.3 -  Alkaline Phos 38 - 126 U/L 80 92 -  AST 15 - 41 U/L 14(L) 14 -  ALT 0 - 44 U/L 12 11 -     RADIOGRAPHIC STUDIES: I have personally reviewed the radiological images as listed and agreed with the findings in the report. No results found.  ASSESSMENT & PLAN:   81 year old female with history of hypertension, diabetes type 2, dyslipidemia, diabetic neuropathy, chronic kidney disease and previous history of monoclonal paraproteinemia with  1) Monoclonal paraproteinemia with an M spike of 0.2 g/dL Patient has a history of MGUS since 2012. Labs were last repeated in the context of a bump in her creatinine. PLAN -I discussed in detail potential etiologies of her monoclonal paraproteinemia and the whole spectrum of potential plasma cell dyscrasias. -We should repeat labs today including CBC, CMP, beta-2 microglobulin, LDH, sedimentation rate, myeloma panel, serum free light chains. -24-hour UPEP -Whole-body skeletal survey to rule out bone lesions. -Patient might likely need bone marrow biopsy if above results are concerning.  2) chronic normocytic anemia hemoglobin in the mid 10 range. This could likely be from the patient's chronic kidney disease less likely from multiple myeloma not to be ruled out. PLAN -Myeloma work-up noted above -We will also check iron labs since her goal for ferritin would be more than 250 in the context of chronic kidney disease and iron  saturation of 20%. -If iron levels are adequate and active myeloma is ruled out patient might need to be considered for EPO.  3) chronic kidney disease stage IV with worsening creatinine. Chronic kidney disease is likely due to hypertension and diabetes. PLan -In the presence of monoclonal paraproteinemia cannot rule out AL amyloidosis or myeloma kidney the less likely. -Optimal control of hypertension and diabetes per primary care physician. -24-hour UPEP -Patient will need referral to nephrology to get their input as well.  If they do not feel hypertension and diabetes explain her changing kidney function will defer to nephrology to consider potential kidney biopsy to rule out involvement of monoclonal paraproteinemia. -Avoid any nephrotoxic medications and IV contrast.  Follow-up Labs today Whole-body skeletal survey in 1 week Phone visit with Dr. Irene Limbo in 3 weeks to discuss results  All of the patients questions were answered with apparent satisfaction. The patient knows to call the clinic with any problems, questions or concerns.  I spent  40 mins counseling the patient face to face. The total time spent in the appointment was 50 mins  and more than 50% was on counseling and direct patient cares.    Sullivan Lone MD [place] AAHIVMS [facility] Hematology/Oncology Physician [facility]  (Office):       [phone] (Work cell):  [phone] (Fax):           [phone]  03/25/2021 1:07 PM

## 2021-03-26 LAB — ERYTHROPOIETIN: Erythropoietin: 5.6 m[IU]/mL (ref 2.6–18.5)

## 2021-03-26 LAB — KAPPA/LAMBDA LIGHT CHAINS
Kappa free light chain: 312.5 mg/L — ABNORMAL HIGH (ref 3.3–19.4)
Kappa, lambda light chain ratio: 8.73 — ABNORMAL HIGH (ref 0.26–1.65)
Lambda free light chains: 35.8 mg/L — ABNORMAL HIGH (ref 5.7–26.3)

## 2021-03-29 DIAGNOSIS — M1990 Unspecified osteoarthritis, unspecified site: Secondary | ICD-10-CM | POA: Diagnosis not present

## 2021-03-29 DIAGNOSIS — E1142 Type 2 diabetes mellitus with diabetic polyneuropathy: Secondary | ICD-10-CM | POA: Diagnosis not present

## 2021-03-29 DIAGNOSIS — E039 Hypothyroidism, unspecified: Secondary | ICD-10-CM | POA: Diagnosis not present

## 2021-03-29 DIAGNOSIS — E785 Hyperlipidemia, unspecified: Secondary | ICD-10-CM | POA: Diagnosis not present

## 2021-03-29 DIAGNOSIS — E1122 Type 2 diabetes mellitus with diabetic chronic kidney disease: Secondary | ICD-10-CM | POA: Diagnosis not present

## 2021-03-29 DIAGNOSIS — I129 Hypertensive chronic kidney disease with stage 1 through stage 4 chronic kidney disease, or unspecified chronic kidney disease: Secondary | ICD-10-CM | POA: Diagnosis not present

## 2021-03-29 DIAGNOSIS — N184 Chronic kidney disease, stage 4 (severe): Secondary | ICD-10-CM | POA: Diagnosis not present

## 2021-03-29 DIAGNOSIS — D472 Monoclonal gammopathy: Secondary | ICD-10-CM | POA: Diagnosis not present

## 2021-03-29 DIAGNOSIS — D649 Anemia, unspecified: Secondary | ICD-10-CM | POA: Diagnosis not present

## 2021-03-29 LAB — MULTIPLE MYELOMA PANEL, SERUM
Albumin SerPl Elph-Mcnc: 3.6 g/dL (ref 2.9–4.4)
Albumin/Glob SerPl: 1.4 (ref 0.7–1.7)
Alpha 1: 0.2 g/dL (ref 0.0–0.4)
Alpha2 Glob SerPl Elph-Mcnc: 0.8 g/dL (ref 0.4–1.0)
B-Globulin SerPl Elph-Mcnc: 0.9 g/dL (ref 0.7–1.3)
Gamma Glob SerPl Elph-Mcnc: 0.8 g/dL (ref 0.4–1.8)
Globulin, Total: 2.7 g/dL (ref 2.2–3.9)
IgA: 163 mg/dL (ref 64–422)
IgG (Immunoglobin G), Serum: 846 mg/dL (ref 586–1602)
IgM (Immunoglobulin M), Srm: 100 mg/dL (ref 26–217)
M Protein SerPl Elph-Mcnc: 0.2 g/dL — ABNORMAL HIGH
Total Protein ELP: 6.3 g/dL (ref 6.0–8.5)

## 2021-04-01 ENCOUNTER — Ambulatory Visit (HOSPITAL_COMMUNITY)
Admission: RE | Admit: 2021-04-01 | Discharge: 2021-04-01 | Disposition: A | Discharge status: Home / Self Care | Payer: Medicare Other | Source: Ambulatory Visit | Attending: Hematology | Admitting: Hematology

## 2021-04-01 ENCOUNTER — Other Ambulatory Visit: Payer: Self-pay

## 2021-04-01 DIAGNOSIS — D472 Monoclonal gammopathy: Secondary | ICD-10-CM | POA: Insufficient documentation

## 2021-04-01 DIAGNOSIS — D649 Anemia, unspecified: Secondary | ICD-10-CM | POA: Insufficient documentation

## 2021-04-01 LAB — UPEP/UIFE/LIGHT CHAINS/TP, 24-HR UR
% BETA, Urine: 8.2 %
ALPHA 1 URINE: 1.6 %
Albumin, U: 71.6 %
Alpha 2, Urine: 6 %
Free Kappa Lt Chains,Ur: 224.56 mg/L — ABNORMAL HIGH (ref 1.17–86.46)
Free Kappa/Lambda Ratio: 16.4 — ABNORMAL HIGH (ref 1.83–14.26)
Free Lambda Lt Chains,Ur: 13.69 mg/L (ref 0.27–15.21)
GAMMA GLOBULIN URINE: 12.6 %
M-SPIKE %, Urine: 4.9 % — ABNORMAL HIGH
M-Spike, Mg/24 Hr: 32 mg/24 hr — ABNORMAL HIGH
Total Protein, Urine-Ur/day: 654 mg/24 hr — ABNORMAL HIGH (ref 30–150)
Total Protein, Urine: 100.6 mg/dL
Total Volume: 650

## 2021-04-15 ENCOUNTER — Inpatient Hospital Stay: Payer: Medicare Other | Attending: Hematology | Admitting: Hematology

## 2021-04-15 DIAGNOSIS — Z79899 Other long term (current) drug therapy: Secondary | ICD-10-CM | POA: Diagnosis not present

## 2021-04-15 DIAGNOSIS — D649 Anemia, unspecified: Secondary | ICD-10-CM

## 2021-04-15 DIAGNOSIS — E039 Hypothyroidism, unspecified: Secondary | ICD-10-CM | POA: Diagnosis not present

## 2021-04-15 DIAGNOSIS — E1122 Type 2 diabetes mellitus with diabetic chronic kidney disease: Secondary | ICD-10-CM | POA: Diagnosis not present

## 2021-04-15 DIAGNOSIS — N184 Chronic kidney disease, stage 4 (severe): Secondary | ICD-10-CM | POA: Insufficient documentation

## 2021-04-15 DIAGNOSIS — D472 Monoclonal gammopathy: Secondary | ICD-10-CM | POA: Diagnosis not present

## 2021-04-15 NOTE — Progress Notes (Signed)
Marland Kitchen   HEMATOLOGY/ONCOLOGY PHONE VISIT NOTE  Date of Service: 04/21/2021  Patient Care Team: Glendale Chard, MD as PCP - General (Internal Medicine) [place] (Pediatric Ophthalmology) Elsie Saas, MD as Consulting Physician (Orthopedic Surgery)  CHIEF COMPLAINTS/PURPOSE OF CONSULTATION:  Phone visit to discuss work-up of monoclonal paraproteinemia  HISTORY OF PRESENTING ILLNESS:   Rita Yoder is a wonderful 81 y.o. female who has been referred to [place] by Dr .Glendale Chard, MD for evaluation and management of abnormal SPEP.  Patient has a history of hypertension, dyslipidemia, diabetes, diabetic polyneuropathy, chronic kidney disease stage IV, hypothyroidism, osteoarthritis, morbid obesity and a previous history of monoclonal gammopathy of undetermined significance noted in 2012.  Patient recently was seen by her primary care physician and an SPEP was sent as a part of work-up of her chronic kidney disease and she was noted to have an M spike of 0.2 g/dL.  IFE not available. She was sent to [place] for further evaluation of her monoclonal paraproteinemia. Her recent CBC from 02/28/2021 showed chronic anemia with a hemoglobin of 10.6 MCV 97 with a normal WBC count of 7.6k normal platelet count of 159k. CMP showed creatinine of 2.76 up from 2.09 in June 2022.  CO2 15, potassium of 4.7.  Patient notes no focal new bone pains.  He does have some chronic joint pains from osteoarthritis. She notes her diabetes is generally well controlled and so is her blood pressure. She does not recollect if she has seen a nephrologist in the past.  No fevers no chills no night sweats no unexpected weight loss. No Acute new focal symptoms.  INTERVAL HISTORY  .I connected with Rita Yoder on .04/15/2021 at  2:40 PM EST by telephone visit and verified that I am speaking with the correct person using two identifiers.   I discussed the limitations, risks, security and privacy concerns  of performing an evaluation and management service by telemedicine and the availability of in-person appointments. I also discussed with the patient that there may be a patient responsible charge related to this service. The patient expressed understanding and agreed to proceed.   Other persons participating in the visit and their role in the encounter: Patient's son  Patients location: Home Providers location: [place]  Chief Complaint: Discussion of work-up for monoclonal paraproteinemia.  Patient was called to discuss results of her work-up for monoclonal paraproteinemia.  She notes no new symptoms since her clinic visit. Labs showed CBC with hemoglobin of 10.3 with MCV of 101.3, WBC count of 7.5k and platelets of 214k. CMP shows worsening chronic kidney disease with creatinine of 2.85. Ferritin of 695 with iron saturation of 44% Erythropoietin level of 5.6 Sed rate 20 CT done Kappa free light chain significantly elevated at 312, lambda free light chains 35.8 with an abnormal kappa lambda light chain ratio of 8.73. Myeloma panel shows IgG kappa monoclonal protein at 0.2 g/dL 24-hour UPEP showed Bence-Jones protein kappa type with immunofixation showing IgG kappa protein  Whole-body skeletal survey done on 04/01/2021 showed no focal lytic lesions.  I discussed that her lab results are consistent with a plasma cell dyscrasia.  Unclear if this is MGUS or smoldering myeloma or active myeloma.  She is having anemia and worsening renal insufficiency which could be due to her other chronic medical issues or could be a reflection of her plasma cell dyscrasia.  We discussed that she will need to follow-up with Dr. Moshe Cipro her nephrologist closely to determine need  for additional renal work-up including possible kidney biopsy in the context of worsening kidney function to rule out the possibility of myeloma kidney versus light chain nephropathy versus light chain deposition  disease versus AL amyloidosis in addition to her other chronic risk factors including hypertension diabetes and dyslipidemia.  This decision will be based on the nephrologists index of suspicion and the patient's goals of care.  I also discussed with the patient our recommendation to consider a bone marrow aspiration biopsy to evaluate for multiple myeloma.  Patient wants to talk to her family before making a decision regarding this.  MEDICAL HISTORY:  Past Medical History:  Diagnosis Date   Chronic kidney disease 12/2009   Diabetes mellitus 2010   Dyslipidemia    Hypertension    Monoclonal gammopathy 03/2010    SURGICAL HISTORY: Past Surgical History:  Procedure Laterality Date   CATARACT EXTRACTION Bilateral 12/2020   CHOLECYSTECTOMY     HERNIA REPAIR     ROTATOR CUFF REPAIR  03/10/1992   THYROID SURGERY     TONSILLECTOMY     TOTAL ABDOMINAL HYSTERECTOMY      SOCIAL HISTORY: Social History   Socioeconomic History   Marital status: Married    Spouse name: Not on file   Number of children: 4   Years of education: Not on file   Highest education level: Not on file  Occupational History   Occupation: retrired  Tobacco Use   Smoking status: Never   Smokeless tobacco: Never  Vaping Use   Vaping Use: Never used  Substance and Sexual Activity   Alcohol use: No   Drug use: Never   Sexual activity: Not Currently  Other Topics Concern   Not on file  Social History Narrative   Not on file   Social Determinants of Health   Financial Resource Strain: Low Risk    Difficulty of Paying Living Expenses: Not hard at all  Food Insecurity: No Food Insecurity   Worried About Charity fundraiser in the Last Year: Never true   Arboriculturist in the Last Year: Never true  Transportation Needs: No Transportation Needs   Lack of Transportation (Medical): No   Lack of Transportation (Non-Medical): No  Physical Activity: Inactive   Days of Exercise per Week: 0 days   Minutes of  Exercise per Session: 0 min  Stress: No Stress Concern Present   Feeling of Stress : Not at all  Social Connections: Not on file  Intimate Partner Violence: Not on file    FAMILY HISTORY: Family History  Problem Relation Age of Onset   Healthy Mother    Heart attack Brother    Cancer Maternal Aunt    Cancer Maternal Uncle    Heart Problems Maternal Grandmother     ALLERGIES:  has No Known Allergies.  MEDICATIONS:  Current Outpatient Medications  Medication Sig Dispense Refill   acetaminophen (TYLENOL) 500 MG tablet Take 500 mg by mouth every 6 (six) hours as needed.     atorvastatin (LIPITOR) 20 MG tablet TAKE 1 TABLET BY MOUTH ONCE DAILY 90 tablet 3   Blood Glucose Calibration (OT ULTRA/FASTTK CNTRL SOLN) SOLN      Cholecalciferol (VITAMIN D3) 50 MCG (2000 UT) capsule Take by mouth. 1 per day     hydrochlorothiazide (MICROZIDE) 12.5 MG capsule TAKE 1 CAPSULE BY MOUTH  DAILY 90 capsule 3   isosorbide mononitrate (IMDUR) 30 MG 24 hr tablet TAKE 1 TABLET(30 MG) BY MOUTH DAILY 90 tablet 1  levothyroxine (SYNTHROID) 25 MCG tablet TAKE 1 TABLET(25 MCG) BY MOUTH EVERY MORNING 90 tablet 0   nitroGLYCERIN (NITROSTAT) 0.4 MG SL tablet DISSOLVE 1 TABLET UNDER THE TONGUE AT FIRST SIGN OF ATTACKS, MAY REPEAT EVERY 5 MINUTES UP TO 3 TABLETS, IF NO RELLIEF SEEK MEDICAL HELP 25 tablet 1   solifenacin (VESICARE) 5 MG tablet Take 5 mg by mouth daily.     No current facility-administered medications for this visit.    REVIEW OF SYSTEMS:    No acute new symptoms  PHYSICAL EXAMINATION: Telemedicine visit   LABORATORY DATA:  I have reviewed the data as listed  . CBC Latest Ref Rng & Units 03/25/2021 02/28/2021 02/20/2020  WBC 4.0 - 10.5 K/uL 7.5 7.6 7.8  Hemoglobin 12.0 - 15.0 g/dL 10.3(L) 10.6(L) 10.4(L)  Hematocrit 36.0 - 46.0 % 32.2(L) 32.5(L) 31.0(L)  Platelets 150 - 400 K/uL 214 159 187    . CMP Latest Ref Rng & Units 03/25/2021 02/28/2021 09/04/2020  Glucose 70 - 99 mg/dL 99 90  116(H)  BUN 8 - 23 mg/dL 53(H) 44(H) 46(H)  Creatinine 0.44 - 1.00 mg/dL 2.85(H) 2.76(H) 2.09(H)  Sodium 135 - 145 mmol/L 145 142 145(H)  Potassium 3.5 - 5.1 mmol/L 4.8 4.7 5.2  Chloride 98 - 111 mmol/L 115(H) 108(H) 111(H)  CO2 22 - 32 mmol/L 21(L) 15(L) 18(L)  Calcium 8.9 - 10.3 mg/dL 9.3 9.1 9.3  Total Protein 6.5 - 8.1 g/dL 6.8 6.2 -  Total Bilirubin 0.3 - 1.2 mg/dL 0.5 0.3 -  Alkaline Phos 38 - 126 U/L 80 92 -  AST 15 - 41 U/L 14(L) 14 -  ALT 0 - 44 U/L 12 11 -     RADIOGRAPHIC STUDIES: I have personally reviewed the radiological images as listed and agreed with the findings in the report. DG Bone Survey Met  Result Date: 04/02/2021 CLINICAL DATA:  Monoclonal paraproteinemia EXAM: METASTATIC BONE SURVEY COMPARISON:  None. FINDINGS: There are no focal lytic lesions. No recent fracture is seen. Degenerative changes are noted in the cervical spine, thoracic spine and lumbar spine. Degenerative changes are noted in the right shoulder right AC joint and both knees. Surgical clips are seen in gallbladder fossa. Heart is enlarged in size. There are no focal abnormalities in the lung fields. There is evidence of ventral hernia repair. Degenerative changes are noted in the SI joints, more so on the left side. IMPRESSION: No focal lytic lesions are seen. Other findings as described in the body of the report. Electronically Signed   By: Elmer Picker M.D.   On: 04/02/2021 18:54    ASSESSMENT & PLAN:   81 year old female with history of hypertension, diabetes type 2, dyslipidemia, diabetic neuropathy, chronic kidney disease and previous history of monoclonal paraproteinemia with  1) Monoclonal paraproteinemia with an M spike of 0.2 g/dL Patient has a history of MGUS since 2012. Labs were last repeated in the context of a bump in her creatinine. PLAN -I discussed in detail potential etiologies of her monoclonal paraproteinemia and the whole spectrum of potential plasma cell  dyscrasias. -We should repeat labs today including CBC, CMP, beta-2 microglobulin, LDH, sedimentation rate, myeloma panel, serum free light chains. -24-hour UPEP -Whole-body skeletal survey to rule out bone lesions. -Patient might likely need bone marrow biopsy if above results are concerning.  2) chronic normocytic anemia hemoglobin in the mid 10 range. This could likely be from the patient's chronic kidney disease less likely from multiple myeloma not to be ruled out. PLAN -Myeloma  work-up noted above -We will also check iron labs since her goal for ferritin would be more than 250 in the context of chronic kidney disease and iron saturation of 20%. -If iron levels are adequate and active myeloma is ruled out patient might need to be considered for EPO.  3) chronic kidney disease stage IV with worsening creatinine. Chronic kidney disease is likely due to hypertension and diabetes. Cannot rule out AL amyloidosis versus light chain deposition disease versus myeloma kidney versus light chain nephropathy PLan Labs showed CBC with hemoglobin of 10.3 with MCV of 101.3, WBC count of 7.5k and platelets of 214k. CMP shows worsening chronic kidney disease with creatinine of 2.85. Ferritin of 695 with iron saturation of 44% Erythropoietin level of 5.6 Sed rate 20 CT done Kappa free light chain significantly elevated at 312, lambda free light chains 35.8 with an abnormal kappa lambda light chain ratio of 8.73. Myeloma panel shows IgG kappa monoclonal protein at 0.2 g/dL 24-hour UPEP showed Bence-Jones protein kappa type with immunofixation showing IgG kappa protein  Whole-body skeletal survey done on 04/01/2021 showed no focal lytic lesions.  I discussed that her lab results are consistent with a plasma cell dyscrasia.  Unclear if this is MGUS or smoldering myeloma or active myeloma.  She is having anemia and worsening renal insufficiency which could be due to her other chronic medical issues or  could be a reflection of her plasma cell dyscrasia.  We discussed that she will need to follow-up with Dr. Moshe Cipro her nephrologist closely to determine need for additional renal work-up including possible kidney biopsy in the context of worsening kidney function to rule out the possibility of myeloma kidney versus light chain nephropathy versus light chain deposition disease versus AL amyloidosis in addition to her other chronic risk factors including hypertension diabetes and dyslipidemia.  This decision will be based on the nephrologists index of suspicion and the patient's goals of care. Patient was recommended to call her nephrology clinic for an expedited appointment.  I will also forward my note to her nephrologist  I also discussed with the patient our recommendation to consider a bone marrow aspiration biopsy to evaluate for multiple myeloma.  Patient wants to talk to her family before making a decision regarding this.  -Avoid any nephrotoxic medications and IV contrast.  Follow-up Patient to call us with her decision about bone marrow aspiration and biopsy and choice regarding further work-up for plasma cell dyscrasia.  All of the patients questions were answered with apparent satisfaction. The patient knows to call the clinic with any problems, questions or concerns  . The total time spent in the appointment was 21 minutes   Sullivan Lone MD [place] AAHIVMS [facility] Hematology/Oncology Physician [facility]

## 2021-04-23 ENCOUNTER — Observation Stay (HOSPITAL_COMMUNITY): Payer: Medicare Other

## 2021-04-23 ENCOUNTER — Other Ambulatory Visit: Payer: Self-pay

## 2021-04-23 ENCOUNTER — Emergency Department (HOSPITAL_COMMUNITY): Payer: Medicare Other

## 2021-04-23 ENCOUNTER — Inpatient Hospital Stay (HOSPITAL_COMMUNITY)
Admission: EM | Admit: 2021-04-23 | Discharge: 2021-05-02 | DRG: 064 | Disposition: A | Discharge status: Rehabilitation Facility | Payer: Medicare Other | Attending: Internal Medicine | Admitting: Internal Medicine

## 2021-04-23 DIAGNOSIS — N390 Urinary tract infection, site not specified: Secondary | ICD-10-CM | POA: Diagnosis present

## 2021-04-23 DIAGNOSIS — K5900 Constipation, unspecified: Secondary | ICD-10-CM | POA: Diagnosis not present

## 2021-04-23 DIAGNOSIS — I480 Paroxysmal atrial fibrillation: Secondary | ICD-10-CM | POA: Diagnosis not present

## 2021-04-23 DIAGNOSIS — Z9841 Cataract extraction status, right eye: Secondary | ICD-10-CM

## 2021-04-23 DIAGNOSIS — R29711 NIHSS score 11: Secondary | ICD-10-CM | POA: Diagnosis not present

## 2021-04-23 DIAGNOSIS — I10 Essential (primary) hypertension: Secondary | ICD-10-CM | POA: Diagnosis not present

## 2021-04-23 DIAGNOSIS — G8191 Hemiplegia, unspecified affecting right dominant side: Secondary | ICD-10-CM | POA: Diagnosis present

## 2021-04-23 DIAGNOSIS — I69320 Aphasia following cerebral infarction: Secondary | ICD-10-CM | POA: Diagnosis not present

## 2021-04-23 DIAGNOSIS — R29710 NIHSS score 10: Secondary | ICD-10-CM | POA: Diagnosis not present

## 2021-04-23 DIAGNOSIS — R414 Neurologic neglect syndrome: Secondary | ICD-10-CM | POA: Diagnosis present

## 2021-04-23 DIAGNOSIS — E669 Obesity, unspecified: Secondary | ICD-10-CM | POA: Diagnosis present

## 2021-04-23 DIAGNOSIS — I129 Hypertensive chronic kidney disease with stage 1 through stage 4 chronic kidney disease, or unspecified chronic kidney disease: Secondary | ICD-10-CM | POA: Diagnosis present

## 2021-04-23 DIAGNOSIS — I4891 Unspecified atrial fibrillation: Secondary | ICD-10-CM | POA: Diagnosis not present

## 2021-04-23 DIAGNOSIS — E785 Hyperlipidemia, unspecified: Secondary | ICD-10-CM | POA: Diagnosis present

## 2021-04-23 DIAGNOSIS — R200 Anesthesia of skin: Secondary | ICD-10-CM | POA: Diagnosis not present

## 2021-04-23 DIAGNOSIS — R739 Hyperglycemia, unspecified: Secondary | ICD-10-CM | POA: Diagnosis not present

## 2021-04-23 DIAGNOSIS — R29700 NIHSS score 0: Secondary | ICD-10-CM | POA: Diagnosis present

## 2021-04-23 DIAGNOSIS — R079 Chest pain, unspecified: Secondary | ICD-10-CM | POA: Diagnosis not present

## 2021-04-23 DIAGNOSIS — D631 Anemia in chronic kidney disease: Secondary | ICD-10-CM | POA: Diagnosis present

## 2021-04-23 DIAGNOSIS — Z6833 Body mass index (BMI) 33.0-33.9, adult: Secondary | ICD-10-CM

## 2021-04-23 DIAGNOSIS — I609 Nontraumatic subarachnoid hemorrhage, unspecified: Secondary | ICD-10-CM | POA: Diagnosis not present

## 2021-04-23 DIAGNOSIS — I63512 Cerebral infarction due to unspecified occlusion or stenosis of left middle cerebral artery: Secondary | ICD-10-CM | POA: Diagnosis not present

## 2021-04-23 DIAGNOSIS — I2510 Atherosclerotic heart disease of native coronary artery without angina pectoris: Secondary | ICD-10-CM | POA: Diagnosis present

## 2021-04-23 DIAGNOSIS — R29709 NIHSS score 9: Secondary | ICD-10-CM | POA: Diagnosis not present

## 2021-04-23 DIAGNOSIS — I160 Hypertensive urgency: Secondary | ICD-10-CM | POA: Diagnosis present

## 2021-04-23 DIAGNOSIS — R29706 NIHSS score 6: Secondary | ICD-10-CM | POA: Diagnosis not present

## 2021-04-23 DIAGNOSIS — R4701 Aphasia: Secondary | ICD-10-CM | POA: Diagnosis present

## 2021-04-23 DIAGNOSIS — Z79899 Other long term (current) drug therapy: Secondary | ICD-10-CM

## 2021-04-23 DIAGNOSIS — E039 Hypothyroidism, unspecified: Secondary | ICD-10-CM | POA: Diagnosis not present

## 2021-04-23 DIAGNOSIS — R29818 Other symptoms and signs involving the nervous system: Secondary | ICD-10-CM | POA: Diagnosis not present

## 2021-04-23 DIAGNOSIS — N184 Chronic kidney disease, stage 4 (severe): Secondary | ICD-10-CM | POA: Diagnosis present

## 2021-04-23 DIAGNOSIS — R946 Abnormal results of thyroid function studies: Secondary | ICD-10-CM | POA: Diagnosis present

## 2021-04-23 DIAGNOSIS — R471 Dysarthria and anarthria: Secondary | ICD-10-CM | POA: Diagnosis present

## 2021-04-23 DIAGNOSIS — G459 Transient cerebral ischemic attack, unspecified: Secondary | ICD-10-CM

## 2021-04-23 DIAGNOSIS — I69351 Hemiplegia and hemiparesis following cerebral infarction affecting right dominant side: Secondary | ICD-10-CM | POA: Diagnosis not present

## 2021-04-23 DIAGNOSIS — E11649 Type 2 diabetes mellitus with hypoglycemia without coma: Secondary | ICD-10-CM | POA: Diagnosis not present

## 2021-04-23 DIAGNOSIS — Z9842 Cataract extraction status, left eye: Secondary | ICD-10-CM

## 2021-04-23 DIAGNOSIS — N179 Acute kidney failure, unspecified: Secondary | ICD-10-CM

## 2021-04-23 DIAGNOSIS — E1122 Type 2 diabetes mellitus with diabetic chronic kidney disease: Secondary | ICD-10-CM | POA: Diagnosis not present

## 2021-04-23 DIAGNOSIS — Z79890 Hormone replacement therapy (postmenopausal): Secondary | ICD-10-CM

## 2021-04-23 DIAGNOSIS — R29707 NIHSS score 7: Secondary | ICD-10-CM | POA: Diagnosis not present

## 2021-04-23 DIAGNOSIS — I4892 Unspecified atrial flutter: Secondary | ICD-10-CM | POA: Diagnosis not present

## 2021-04-23 DIAGNOSIS — Z8249 Family history of ischemic heart disease and other diseases of the circulatory system: Secondary | ICD-10-CM

## 2021-04-23 DIAGNOSIS — Z20822 Contact with and (suspected) exposure to covid-19: Secondary | ICD-10-CM | POA: Diagnosis present

## 2021-04-23 DIAGNOSIS — I639 Cerebral infarction, unspecified: Principal | ICD-10-CM | POA: Diagnosis present

## 2021-04-23 DIAGNOSIS — D472 Monoclonal gammopathy: Secondary | ICD-10-CM | POA: Diagnosis not present

## 2021-04-23 DIAGNOSIS — Z90710 Acquired absence of both cervix and uterus: Secondary | ICD-10-CM

## 2021-04-23 DIAGNOSIS — N1832 Chronic kidney disease, stage 3b: Secondary | ICD-10-CM | POA: Diagnosis not present

## 2021-04-23 DIAGNOSIS — Z9049 Acquired absence of other specified parts of digestive tract: Secondary | ICD-10-CM

## 2021-04-23 DIAGNOSIS — I63532 Cerebral infarction due to unspecified occlusion or stenosis of left posterior cerebral artery: Secondary | ICD-10-CM | POA: Diagnosis not present

## 2021-04-23 LAB — I-STAT CHEM 8, ED
BUN: 45 mg/dL — ABNORMAL HIGH (ref 8–23)
Calcium, Ion: 1.16 mmol/L (ref 1.15–1.40)
Chloride: 113 mmol/L — ABNORMAL HIGH (ref 98–111)
Creatinine, Ser: 3.3 mg/dL — ABNORMAL HIGH (ref 0.44–1.00)
Glucose, Bld: 152 mg/dL — ABNORMAL HIGH (ref 70–99)
HCT: 29 % — ABNORMAL LOW (ref 36.0–46.0)
Hemoglobin: 9.9 g/dL — ABNORMAL LOW (ref 12.0–15.0)
Potassium: 4.4 mmol/L (ref 3.5–5.1)
Sodium: 144 mmol/L (ref 135–145)
TCO2: 20 mmol/L — ABNORMAL LOW (ref 22–32)

## 2021-04-23 LAB — COMPREHENSIVE METABOLIC PANEL
ALT: 15 U/L (ref 0–44)
AST: 23 U/L (ref 15–41)
Albumin: 3.5 g/dL (ref 3.5–5.0)
Alkaline Phosphatase: 76 U/L (ref 38–126)
Anion gap: 9 (ref 5–15)
BUN: 49 mg/dL — ABNORMAL HIGH (ref 8–23)
CO2: 20 mmol/L — ABNORMAL LOW (ref 22–32)
Calcium: 9 mg/dL (ref 8.9–10.3)
Chloride: 113 mmol/L — ABNORMAL HIGH (ref 98–111)
Creatinine, Ser: 3.13 mg/dL — ABNORMAL HIGH (ref 0.44–1.00)
GFR, Estimated: 14 mL/min — ABNORMAL LOW (ref >60)
Glucose, Bld: 160 mg/dL — ABNORMAL HIGH (ref 70–99)
Potassium: 4.5 mmol/L (ref 3.5–5.1)
Sodium: 142 mmol/L (ref 135–145)
Total Bilirubin: 0.4 mg/dL (ref 0.3–1.2)
Total Protein: 6 g/dL — ABNORMAL LOW (ref 6.5–8.1)

## 2021-04-23 LAB — DIFFERENTIAL
Abs Immature Granulocytes: 0.03 10*3/uL (ref 0.00–0.07)
Basophils Absolute: 0 10*3/uL (ref 0.0–0.1)
Basophils Relative: 0 %
Eosinophils Absolute: 0.2 10*3/uL (ref 0.0–0.5)
Eosinophils Relative: 2 %
Immature Granulocytes: 0 %
Lymphocytes Relative: 33 %
Lymphs Abs: 3.2 10*3/uL (ref 0.7–4.0)
Monocytes Absolute: 0.6 10*3/uL (ref 0.1–1.0)
Monocytes Relative: 6 %
Neutro Abs: 5.7 10*3/uL (ref 1.7–7.7)
Neutrophils Relative %: 59 %

## 2021-04-23 LAB — CBG MONITORING, ED: Glucose-Capillary: 137 mg/dL — ABNORMAL HIGH (ref 70–99)

## 2021-04-23 LAB — CBC
HCT: 31.8 % — ABNORMAL LOW (ref 36.0–46.0)
Hemoglobin: 9.7 g/dL — ABNORMAL LOW (ref 12.0–15.0)
MCH: 31.9 pg (ref 26.0–34.0)
MCHC: 30.5 g/dL (ref 30.0–36.0)
MCV: 104.6 fL — ABNORMAL HIGH (ref 80.0–100.0)
Platelets: 176 10*3/uL (ref 150–400)
RBC: 3.04 MIL/uL — ABNORMAL LOW (ref 3.87–5.11)
RDW: 13 % (ref 11.5–15.5)
WBC: 9.7 10*3/uL (ref 4.0–10.5)
nRBC: 0 % (ref 0.0–0.2)

## 2021-04-23 LAB — ETHANOL: Alcohol, Ethyl (B): <10 mg/dL (ref <10)

## 2021-04-23 LAB — APTT: aPTT: 27 seconds (ref 24–36)

## 2021-04-23 LAB — PROTIME-INR
INR: 1 (ref 0.8–1.2)
Prothrombin Time: 13.3 seconds (ref 11.4–15.2)

## 2021-04-23 MED ORDER — LORAZEPAM 0.5 MG PO TABS: 0.5000 mg | ORAL_TABLET | ORAL | Status: DC | PRN

## 2021-04-23 MED ORDER — SENNOSIDES-DOCUSATE SODIUM 8.6-50 MG PO TABS: 1.0000 | ORAL_TABLET | Freq: Every evening | ORAL | Status: DC | PRN

## 2021-04-23 MED ORDER — ASPIRIN EC 81 MG PO TBEC
81.0000 mg | DELAYED_RELEASE_TABLET | Freq: Every day | ORAL | Status: DC
  Administered 2021-04-23 – 2021-04-27 (×5): 81 mg via ORAL
  Filled 2021-04-23 (×5): qty 1

## 2021-04-23 MED ORDER — HEPARIN SODIUM (PORCINE) 5000 UNIT/ML IJ SOLN
5000.0000 [IU] | Freq: Two times a day (BID) | INTRAMUSCULAR | Status: DC
  Administered 2021-04-24 – 2021-04-27 (×8): 5000 [IU] via SUBCUTANEOUS
  Filled 2021-04-23 (×8): qty 1

## 2021-04-23 MED ORDER — DARIFENACIN HYDROBROMIDE ER 7.5 MG PO TB24
7.5000 mg | ORAL_TABLET | Freq: Every day | ORAL | Status: DC
  Administered 2021-04-24 – 2021-05-02 (×9): 7.5 mg via ORAL
  Filled 2021-04-23 (×10): qty 1

## 2021-04-23 MED ORDER — ATORVASTATIN CALCIUM 10 MG PO TABS
20.0000 mg | ORAL_TABLET | Freq: Every day | ORAL | Status: DC
  Administered 2021-04-23: 20 mg via ORAL
  Filled 2021-04-23: qty 2

## 2021-04-23 MED ORDER — ACETAMINOPHEN 500 MG PO TABS
500.0000 mg | ORAL_TABLET | Freq: Four times a day (QID) | ORAL | Status: DC | PRN
  Filled 2021-04-23: qty 1

## 2021-04-23 MED ORDER — STROKE: EARLY STAGES OF RECOVERY BOOK
Freq: Once | Status: AC
  Administered 2021-04-24: 1
  Filled 2021-04-23: qty 1

## 2021-04-23 MED ORDER — LEVOTHYROXINE SODIUM 25 MCG PO TABS
25.0000 ug | ORAL_TABLET | Freq: Every day | ORAL | Status: DC
  Administered 2021-04-24 – 2021-05-02 (×9): 25 ug via ORAL
  Filled 2021-04-23 (×9): qty 1

## 2021-04-23 MED ORDER — INSULIN ASPART 100 UNIT/ML IJ SOLN: 0.0000 [IU] | Freq: Three times a day (TID) | INTRAMUSCULAR | Status: DC

## 2021-04-23 MED ORDER — HYDRALAZINE HCL 25 MG PO TABS
25.0000 mg | ORAL_TABLET | Freq: Four times a day (QID) | ORAL | Status: DC | PRN
  Administered 2021-04-23 – 2021-04-29 (×3): 25 mg via ORAL
  Filled 2021-04-23 (×3): qty 1

## 2021-04-23 NOTE — ED Provider Notes (Signed)
[facility] EMERGENCY DEPARTMENT Provider Note   CSN: [id number] Arrival date & time: 04/23/21  1600     History  Chief Complaint  Patient presents with   Code Stroke    Rita Yoder is a 81 y.o. female.  81 year old female who presents with intermittent right upper extremity paresthesias and weakness with associated expressive aphasia.  Patient states that symptoms started several hours ago and have been waxing and waning.  No prior history of same.  No associated headache or confusion.  When patient presented initially in triage she had developed some expressive aphasia.  Code stroke activated.      Home Medications Prior to Admission medications   Medication Sig Start Date End Date Taking? Authorizing Provider  acetaminophen (TYLENOL) 500 MG tablet Take 500 mg by mouth every 6 (six) hours as needed.    [provider]  atorvastatin (LIPITOR) 20 MG tablet TAKE 1 TABLET BY MOUTH ONCE DAILY 01/25/21   Glendale Chard, MD  Blood Glucose Calibration (OT ULTRA/FASTTK CNTRL SOLN) SOLN  01/10/16   [provider]  Cholecalciferol (VITAMIN D3) 50 MCG (2000 UT) capsule Take by mouth. 1 per day    [provider]  hydrochlorothiazide (MICROZIDE) 12.5 MG capsule TAKE 1 CAPSULE BY MOUTH  DAILY 10/03/20   Glendale Chard, MD  isosorbide mononitrate (IMDUR) 30 MG 24 hr tablet TAKE 1 TABLET(30 MG) BY MOUTH DAILY 01/18/21   Glendale Chard, MD  levothyroxine (SYNTHROID) 25 MCG tablet TAKE 1 TABLET(25 MCG) BY MOUTH EVERY MORNING 03/05/21   Glendale Chard, MD  nitroGLYCERIN (NITROSTAT) 0.4 MG SL tablet DISSOLVE 1 TABLET UNDER THE TONGUE AT FIRST SIGN OF ATTACKS, MAY REPEAT EVERY 5 MINUTES UP TO 3 TABLETS, IF NO RELLIEF [place] 08/20/20   Glendale Chard, MD  solifenacin (VESICARE) 5 MG tablet Take 5 mg by mouth daily. 03/16/19   [provider]      Allergies    Patient has no known allergies.    Review of Systems   Review of  Systems  All other systems reviewed and are negative.  Physical Exam Updated Vital Signs BP (!) 192/72 (BP Location: Right Arm)    Pulse 71    Temp 98.9 F (37.2 C)    Resp 20    SpO2 100%  Physical Exam Vitals and nursing note reviewed.  Constitutional:      General: She is not in acute distress.    Appearance: Normal appearance. She is well-developed. She is not toxic-appearing.  HENT:     Head: Normocephalic and atraumatic.  Eyes:     General: Lids are normal.     Conjunctiva/sclera: Conjunctivae normal.     Pupils: Pupils are equal, round, and reactive to light.  Neck:     Thyroid: No thyroid mass.     Trachea: No tracheal deviation.  Cardiovascular:     Rate and Rhythm: Normal rate and regular rhythm.     Heart sounds: Normal heart sounds. No murmur heard.   No gallop.  Pulmonary:     Effort: Pulmonary effort is normal. No respiratory distress.     Breath sounds: Normal breath sounds. No stridor. No decreased breath sounds, wheezing, rhonchi or rales.  Abdominal:     General: There is no distension.     Palpations: Abdomen is soft.     Tenderness: There is no abdominal tenderness. There is no rebound.  Musculoskeletal:        General: No tenderness. Normal range of  motion.     Cervical back: Normal range of motion and neck supple.  Skin:    General: Skin is warm and dry.     Findings: No abrasion or rash.  Neurological:     General: No focal deficit present.     Mental Status: She is alert and oriented to person, place, and time. Mental status is at baseline.     GCS: GCS eye subscore is 4. GCS verbal subscore is 5. GCS motor subscore is 6.     Cranial Nerves: No cranial nerve deficit.     Sensory: No sensory deficit.     Motor: Motor function is intact.     Coordination: Coordination is intact.  Psychiatric:        Attention and Perception: Attention normal.        Speech: Speech normal.        Behavior: Behavior normal.    ED Results / Procedures /  Treatments   Labs (all labs ordered are listed, but only abnormal results are displayed) Labs Reviewed  CBC - Abnormal; Notable for the following components:      Result Value   RBC 3.04 (*)    Hemoglobin 9.7 (*)    HCT 31.8 (*)    MCV 104.6 (*)    All other components within normal limits  I-STAT CHEM 8, ED - Abnormal; Notable for the following components:   Chloride 113 (*)    BUN 45 (*)    Creatinine, Ser 3.30 (*)    Glucose, Bld 152 (*)    TCO2 20 (*)    Hemoglobin 9.9 (*)    HCT 29.0 (*)    All other components within normal limits  CBG MONITORING, ED - Abnormal; Notable for the following components:   Glucose-Capillary 137 (*)    All other components within normal limits  RESP PANEL BY RT-PCR (FLU A&B, COVID) ARPGX2  PROTIME-INR  APTT  DIFFERENTIAL  ETHANOL  COMPREHENSIVE METABOLIC PANEL  RAPID URINE DRUG SCREEN, HOSP PERFORMED  URINALYSIS, ROUTINE W REFLEX MICROSCOPIC    EKG None  Radiology CT HEAD CODE STROKE WO CONTRAST  Result Date: 04/23/2021 CLINICAL DATA:  Code stroke. Acute neuro deficit. Left-sided gaze. Difficulty speaking. EXAM: CT HEAD WITHOUT CONTRAST TECHNIQUE: Contiguous axial images were obtained from the base of the skull through the vertex without intravenous contrast. RADIATION DOSE REDUCTION: This exam was performed according to the departmental dose-optimization program which includes automated exposure control, adjustment of the mA and/or kV according to patient size and/or use of iterative reconstruction technique. COMPARISON:  None. FINDINGS: Brain: Mild atrophy. Moderate white matter hypodensity bilaterally. This is symmetric and most likely chronic microvascular ischemia. Negative for acute hemorrhage or mass. 8 mm hypodensity left thalamus compatible with infarct of indeterminate age. Vascular: Negative for hyperdense vessel Skull: Negative Sinuses/Orbits: Retention cyst right maxillary sinus. Remaining sinuses clear. Bilateral cataract  extraction Other: None ASPECTS ([place] Stroke Program Early CT Score) - Ganglionic level infarction (caudate, lentiform nuclei, internal capsule, insula, M1-M3 cortex): 7 - Supraganglionic infarction (M4-M6 cortex): 3 Total score (0-10 with 10 being normal): 10 IMPRESSION: 1. 8 mm hypodensity left thalamus compatible with infarct of indeterminate age. Possibly acute. No acute hemorrhage. 2. ASPECTS is 10 3. Atrophy and moderate chronic microvascular ischemic change. 4. Code stroke imaging results were communicated on 04/23/2021 at 4:39 pm to provider Palikh via text page Electronically Signed   By: Franchot Gallo M.D.   On: 04/23/2021 16:41    Procedures Procedures  Medications Ordered in ED Medications - No data to display  ED Course/ Medical Decision Making/ A&P                           Medical Decision Making Amount and/or Complexity of Data Reviewed ECG/medicine tests: ordered.   Patient presented as code stroke on arrival here.  Seen by neurology on arrival as well.  Patient was found to be hypertensive with blood pressure above 240.  Repeat was 190.  Head CT did not show any acute bleed or mass.  Per neurology recommendations, patient's blood pressure will be kept with a systolic below 784.  Patient is neurological exam is normal at this time.  She is mentating appropriately.  They recommend patient be admitted for further work-up at this time.        Final Clinical Impression(s) / ED Diagnoses Final diagnoses:  None    Rx / DC Orders ED Discharge Orders     None         Lacretia Leigh, MD 04/23/21 1724

## 2021-04-23 NOTE — Code Documentation (Addendum)
Stroke Response Nurse Documentation Code Documentation  Starlynn Klinkner is a 81 y.o. female arriving to [facility] ED via Private Vehicle on 04/23/21 with past medical hx of CKD. On No antithrombotic. Code stroke was activated by Triage RN.  Patient from home where she was LKW at 1500. Daughter reports around this time she started experiencing some generalized weakness and right hand tingling. Daughter drove the patient to the hospital due to concerns for stroke. In triage the daughter reports that the patient began experiencing  difficulty speaking and gauze changes. When the patient was first seen at the bridge she did have some aphasia which quickly resolved to baseline.  Stroke team at the bedside on patient arrival. Labs drawn and patient cleared for CT by EDP. Patient to CT with team. NIHSS 1, see documentation for details and code stroke times. Patient with Expressive aphasia  on exam. The following imaging was completed: CT. By the time CT completed patient back to baseline with NIHSS 0. Patient is not a candidate for IV Thrombolytic due to resolved symptoms. Patient is not a candidate for IR due to no suspected LVO.  Care/Plan: Code stroke cancelled, discussed with RN   Bedside handoff with ED RN Josh.  Meda Klinefelter  Stroke Response RN

## 2021-04-23 NOTE — ED Notes (Signed)
Patient transported to MRI 

## 2021-04-23 NOTE — ED Triage Notes (Signed)
Patient brought in with c/o weakness to right arm. Initially pt alert and talking to staff and following commands. In triage room patient became unresponsive and not following commands. EDP activated code stroke.

## 2021-04-23 NOTE — Consult Note (Addendum)
Stroke Neurology Consultation Note  Consult Requested by: ED  Reason for Consult: Code stroke.  Consult Date: 04/23/21   History of Present Illness:  Rita Yoder is a 81 y.o. African American female with PMH of CKD, DM, HTN. Brought to the ED by her daughter as she was weak noted to have word finding difficulties and right arm tingling. While in the waiting room, she got worse with gaze deviation and not speaking. Code stroke activated. By the time I saw her in the CT scanner her symptoms had resolved completely. BP was over 413 systolic.   LSN: 1500. tPA Given: No: rapid symptom resolution.  Unable to get CTA due to Kidney disease.  mRS: 0  Past Medical History:  Diagnosis Date   Chronic kidney disease 12/2009   Diabetes mellitus 2010   Dyslipidemia    Hypertension    Monoclonal gammopathy 03/2010    Past Surgical History:  Procedure Laterality Date   CATARACT EXTRACTION Bilateral 12/2020   CHOLECYSTECTOMY     HERNIA REPAIR     ROTATOR CUFF REPAIR  03/10/1992   THYROID SURGERY     TONSILLECTOMY     TOTAL ABDOMINAL HYSTERECTOMY      Family History  Problem Relation Age of Onset   Healthy Mother    Heart attack Brother    Cancer Maternal Aunt    Cancer Maternal Uncle    Heart Problems Maternal Grandmother     Social History:  reports that she has never smoked. She has never used smokeless tobacco. She reports that she does not drink alcohol and does not use drugs.  Allergies: No Known Allergies  No current facility-administered medications on file prior to encounter.   Current Outpatient Medications on File Prior to Encounter  Medication Sig Dispense Refill   acetaminophen (TYLENOL) 500 MG tablet Take 500 mg by mouth every 6 (six) hours as needed.     atorvastatin (LIPITOR) 20 MG tablet TAKE 1 TABLET BY MOUTH ONCE DAILY 90 tablet 3   Blood Glucose Calibration (OT ULTRA/FASTTK CNTRL SOLN) SOLN      Cholecalciferol (VITAMIN D3) 50 MCG (2000 UT) capsule  Take by mouth. 1 per day     hydrochlorothiazide (MICROZIDE) 12.5 MG capsule TAKE 1 CAPSULE BY MOUTH  DAILY 90 capsule 3   isosorbide mononitrate (IMDUR) 30 MG 24 hr tablet TAKE 1 TABLET(30 MG) BY MOUTH DAILY 90 tablet 1   levothyroxine (SYNTHROID) 25 MCG tablet TAKE 1 TABLET(25 MCG) BY MOUTH EVERY MORNING 90 tablet 0   nitroGLYCERIN (NITROSTAT) 0.4 MG SL tablet DISSOLVE 1 TABLET UNDER THE TONGUE AT FIRST SIGN OF ATTACKS, MAY REPEAT EVERY 5 MINUTES UP TO 3 TABLETS, IF NO RELLIEF SEEK MEDICAL HELP 25 tablet 1   solifenacin (VESICARE) 5 MG tablet Take 5 mg by mouth daily.      Review of Systems: A full 12 point ROS was attempted today and was  able to be performed.  Systems assessed include - Constitutional, Eyes, HENT, Respiratory, Cardiovascular, Gastrointestinal, Genitourinary, Integument/breast, Hematologic/lymphatic, Musculoskeletal, Neurological, Behavioral/Psych, Endocrine, Allergic/Immunologic - with pertinent responses as per HPI.  Physical Examination: Temp:  [98.9 F (37.2 C)] 98.9 F (37.2 C) (02/14 1603) Pulse Rate:  [71] 71 (02/14 1603) Resp:  [20] 20 (02/14 1603) BP: (152-241)/(72-79) 192/72 (02/14 1649) SpO2:  [100 %] 100 % (02/14 1603)  General - well nourished, well developed, in no apparent distress   Ophthalmologic - fundi not visualized due to noncooperation.    Cardiovascular - regular rhythm and  rate  Mental Status -  Level of arousal and orientation to time, place, and person were intact No aphasia.follows all commands.   Cranial Nerves II - XII - II - Vision intact OU III, IV, VI - Extraocular movements intact V - Facial sensation intact bilaterally VII - Facial movement intact bilaterally VIII - Hearing & vestibular intact bilaterally X - Palate elevates symmetrically XI - Chin turning & shoulder shrug intact bilaterally XII - Tongue protrusion intact  Motor Strength - The patients strength was normal in all extremities and pronator drift was  absent  Motor Tone & Bulk - Muscle tone was assessed at the neck and appendages and was normal.  Bulk was normal and fasciculations were absent  Reflexes - The patients reflexes were normal in all extremities and she had no pathological reflexes  Sensory - Light touch normal. No DSE.  Coordination - The patient had normal movements in the hands and feet with no ataxia or dysmetria.  Tremor was absent  Gait and Station - deferred NIHSS: 0  Interval: Initial (02/14 1630) Level of Consciousness (1a.)   : Alert, keenly responsive (02/14 1640) LOC Questions (1b. )   +: Answers both questions correctly (02/14 1640) LOC Commands (1c. )   + : Performs both tasks correctly (02/14 1640) Best Gaze (2. )  +: Normal (02/14 1640) Visual (3. )  +: No visual loss (02/14 1640) Facial Palsy (4. )    : Normal symmetrical movements (02/14 1640) Motor Arm, Left (5a. )   +: No drift (02/14 1640) Motor Arm, Right (5b. )   +: No drift (02/14 1640) Motor Leg, Left (6a. )   +: No drift (02/14 1640) Motor Leg, Right (6b. )   +: No drift (02/14 1640) Limb Ataxia (7. ): Absent (02/14 1640) Sensory (8. )   +: Normal, no sensory loss (02/14 1640) Best Language (9. )   +: No aphasia (02/14 1640) Dysarthria (10. ): Normal (02/14 1640) Extinction/Inattention (11.)   +: No Abnormality (02/14 1640) Modified SS Total  +: 0 (02/14 1640) Complete NIHSS TOTAL: 0 (02/14 1640)   Data Reviewed: DG Bone Survey Met  Result Date: 04/02/2021 CLINICAL DATA:  Monoclonal paraproteinemia EXAM: METASTATIC BONE SURVEY COMPARISON:  None. FINDINGS: There are no focal lytic lesions. No recent fracture is seen. Degenerative changes are noted in the cervical spine, thoracic spine and lumbar spine. Degenerative changes are noted in the right shoulder right AC joint and both knees. Surgical clips are seen in gallbladder fossa. Heart is enlarged in size. There are no focal abnormalities in the lung fields. There is evidence of ventral  hernia repair. Degenerative changes are noted in the SI joints, more so on the left side. IMPRESSION: No focal lytic lesions are seen. Other findings as described in the body of the report. Electronically Signed   By: Elmer Picker M.D.   On: 04/02/2021 18:54   CT HEAD CODE STROKE WO CONTRAST  Result Date: 04/23/2021 CLINICAL DATA:  Code stroke. Acute neuro deficit. Left-sided gaze. Difficulty speaking. EXAM: CT HEAD WITHOUT CONTRAST TECHNIQUE: Contiguous axial images were obtained from the base of the skull through the vertex without intravenous contrast. RADIATION DOSE REDUCTION: This exam was performed according to the departmental dose-optimization program which includes automated exposure control, adjustment of the mA and/or kV according to patient size and/or use of iterative reconstruction technique. COMPARISON:  None. FINDINGS: Brain: Mild atrophy. Moderate white matter hypodensity bilaterally. This is symmetric and most likely chronic microvascular  ischemia. Negative for acute hemorrhage or mass. 8 mm hypodensity left thalamus compatible with infarct of indeterminate age. Vascular: Negative for hyperdense vessel Skull: Negative Sinuses/Orbits: Retention cyst right maxillary sinus. Remaining sinuses clear. Bilateral cataract extraction Other: None ASPECTS ([place] Stroke Program Early CT Score) - Ganglionic level infarction (caudate, lentiform nuclei, internal capsule, insula, M1-M3 cortex): 7 - Supraganglionic infarction (M4-M6 cortex): 3 Total score (0-10 with 10 being normal): 10 IMPRESSION: 1. 8 mm hypodensity left thalamus compatible with infarct of indeterminate age. Possibly acute. No acute hemorrhage. 2. ASPECTS is 10 3. Atrophy and moderate chronic microvascular ischemic change. 4. Code stroke imaging results were communicated on 04/23/2021 at 4:39 pm to provider Joniyah Mallinger via text page Electronically Signed   By: Franchot Gallo M.D.   On: 04/23/2021 16:41    Assessment: 81 y.o. female with  history of chronic kidney disease, diabetes, dyslipidemia hypertension and monoclonal gammopathy.  Had acute onset aphasia and left-sided weakness while in the waiting room in the ER.  During my exam her symptoms had resolved.  She was hypertensive and there is concern for possible hypertensive urgency.  Code stroke was canceled.  It appears from her CT she has numerous white matter lesions.  It appears that she has left thalamic completed stroke on CT.  Unable to do CTA due to elevated creatinine.  She had rapid resolution of symptoms with no focal findings.  She may have had a TIA or completed left thalamic CVA versus hypertensive urgency.  Stroke Risk Factors - diabetes mellitus and hypertension  Plan: - HgbA1c, fasting lipid panel Permissive hypertension (OK if <220/120) for 24-48 hours post stroke and then gradually normalized within 5-7 days.  - MRI,A of the brain without contrast -Carotid dopplers. - PT consult, OT consult, Speech consult - Echocardiogram - Prophylactic therapy-Antiplatelet med: Aspirin - dose $Remo'325mg'IuayI$ . - Risk factor modification - Telemetry monitoring - Frequent neuro checks q 4 hrs.   Plan discussed with ED physician.  Thank you for this consultation and allowing [place] to participate in the care of this patient.   ATTENDING ATTESTATION:  MDM: High. This patient is critically ill due to CVA, HTN urgency and at significant risk of neurological worsening, death form heart failure, respiratory failure, recurrent stroke, bleeding from [facility], seizure, sepsis. This patient's care requires constant monitoring of vital signs, hemodynamics, respiratory and cardiac monitoring, review of multiple databases, neurological assessment, discussion with family, other specialists and medical decision making of high complexity.   ADDENDUM: MRI with acute/subacute strokes. MRA with multiple stenosis but not thrombus.  Stroke team to follow.  Jonavin Seder,MD

## 2021-04-23 NOTE — ED Provider Triage Note (Signed)
Emergency Medicine Provider Triage Evaluation Note  Rita Yoder , a 81 y.o. female  was evaluated in triage.  Pt complains of weakness.  Started feeling weak this afternoon around 1 PM.  While in triage she started having leftward gaze.  She became unresponsive, not following commands or communicative which is very different than when she was first brought in..  Review of Systems  Positive: above Negative: above  Physical Exam  BP (!) 152/79    Pulse 71    Temp 98.9 F (37.2 C)    Resp 20    SpO2 100%  Gen:   Awake, no distress   Resp:  Normal effort  MSK:   Moves extremities without difficulty  Other:  Leftward gaze.  Patient is not communicative, slight slurred speech in the room.  Unable to follow commands.  Medical Decision Making  Medically screening exam initiated at 4:19 PM.  Appropriate orders placed.  Rita Yoder was informed that the remainder of the evaluation will be completed by another provider, this initial triage assessment does not replace that evaluation, and the importance of remaining in the ED until their evaluation is complete.  Code stroke    Sherrill Raring, [place] 04/23/21 1620

## 2021-04-23 NOTE — H&P (Signed)
History and Physical    Rita Yoder ZCH:[id number] DOB: 27-Feb-1941 DOA: 04/23/2021  PCP: Glendale Chard, MD (Confirm with patient/family/NH records and if not entered, this has to be entered at [facility] point of entry) Patient coming from: Home  I have personally briefly reviewed patient's old medical records in [place]  Chief Complaint: Right sided numbness  HPI: Rita Yoder is a 81 y.o. female with medical history significant of HTN, IIDM, HLD, stage IIIb, normocytic anemia, came with new onset of right arm and hand numbness tingling.  Symptoms started around 1 PM, she denies any weakness of any of the limbs.  According to daughter, patient was briefly not talking and gaze to the right side but resolved soon.  At that time patient arrived in the ED, most of her symptoms of right-sided numbness has resolved.  Denies any headache or blurry vision.  She used to take aspirin which was discontinued by PCP several years ago.  Daughter reported patient blood pressure has been fairly controlled at home SBP= 140s.  ED Course: Blood pressure elevated SBP>160.  Creatinine 3.1, bicarb 20, BUN 49, glucose 160 hemoglobin 9.7 compared to baseline 10.3.  CT had hypodensity of left thalamus suspicious for acute/subacute stroke.  Review of Systems: As per HPI otherwise 14 point review of systems negative.    Past Medical History:  Diagnosis Date   Chronic kidney disease 12/2009   Diabetes mellitus 2010   Dyslipidemia    Hypertension    Monoclonal gammopathy 03/2010    Past Surgical History:  Procedure Laterality Date   CATARACT EXTRACTION Bilateral 12/2020   CHOLECYSTECTOMY     HERNIA REPAIR     ROTATOR CUFF REPAIR  03/10/1992   THYROID SURGERY     TONSILLECTOMY     TOTAL ABDOMINAL HYSTERECTOMY       reports that she has never smoked. She has never used smokeless tobacco. She reports that she does not drink alcohol and does not use drugs.  No Known Allergies  Family  History  Problem Relation Age of Onset   Healthy Mother    Heart attack Brother    Cancer Maternal Aunt    Cancer Maternal Uncle    Heart Problems Maternal Grandmother      Prior to Admission medications   Medication Sig Start Date End Date Taking? Authorizing Provider  acetaminophen (TYLENOL) 500 MG tablet Take 500 mg by mouth every 6 (six) hours as needed.    [provider]  atorvastatin (LIPITOR) 20 MG tablet TAKE 1 TABLET BY MOUTH ONCE DAILY 01/25/21   Glendale Chard, MD  Blood Glucose Calibration (OT ULTRA/FASTTK CNTRL SOLN) SOLN  01/10/16   [provider]  Cholecalciferol (VITAMIN D3) 50 MCG (2000 UT) capsule Take by mouth. 1 per day    [provider]  hydrochlorothiazide (MICROZIDE) 12.5 MG capsule TAKE 1 CAPSULE BY MOUTH  DAILY 10/03/20   Glendale Chard, MD  isosorbide mononitrate (IMDUR) 30 MG 24 hr tablet TAKE 1 TABLET(30 MG) BY MOUTH DAILY 01/18/21   Glendale Chard, MD  levothyroxine (SYNTHROID) 25 MCG tablet TAKE 1 TABLET(25 MCG) BY MOUTH EVERY MORNING 03/05/21   Glendale Chard, MD  nitroGLYCERIN (NITROSTAT) 0.4 MG SL tablet DISSOLVE 1 TABLET UNDER THE TONGUE AT FIRST SIGN OF ATTACKS, MAY REPEAT EVERY 5 MINUTES UP TO 3 TABLETS, IF NO RELLIEF [place] 08/20/20   Glendale Chard, MD  solifenacin (VESICARE) 5 MG tablet Take 5 mg by mouth daily. 03/16/19   [provider]    Physical Exam: Vitals:   04/23/21 1603 04/23/21 1640 04/23/21 1649 04/23/21 1700  BP: (!) 152/79 (!) 241/77 (!) 192/72 (!) 153/77  Pulse: 71   (!) 51  Resp: 20   17  Temp: 98.9 F (37.2 C)     SpO2: 100%   100%    Constitutional: NAD, calm, comfortable Vitals:   04/23/21 1603 04/23/21 1640 04/23/21 1649 04/23/21 1700  BP: (!) 152/79 (!) 241/77 (!) 192/72 (!) 153/77  Pulse: 71   (!) 51  Resp: 20   17  Temp: 98.9 F (37.2 C)     SpO2: 100%   100%   Eyes: PERRL, lids and conjunctivae normal ENMT: Mucous membranes are moist. Posterior pharynx clear of any  exudate or lesions.Normal dentition.  Neck: normal, supple, no masses, no thyromegaly Respiratory: clear to auscultation bilaterally, no wheezing, no crackles. Normal respiratory effort. No accessory muscle use.  Cardiovascular: Regular rate and rhythm, no murmurs / rubs / gallops. No extremity edema. 2+ pedal pulses. No carotid bruits.  Abdomen: no tenderness, no masses palpated. No hepatosplenomegaly. Bowel sounds positive.  Musculoskeletal: no clubbing / cyanosis. No joint deformity upper and lower extremities. Good ROM, no contractures. Normal muscle tone.  Skin: no rashes, lesions, ulcers. No induration Neurologic: CN 2-12 grossly intact. Sensation intact, DTR normal. Strength 5/5 in all 4.  Psychiatric: Normal judgment and insight. Alert and oriented x 3. Normal mood.     Labs on Admission: I have personally reviewed following labs and imaging studies  CBC: Recent Labs  Lab 04/23/21 1623 04/23/21 1629  WBC 9.7  --   NEUTROABS 5.7  --   HGB 9.7* 9.9*  HCT 31.8* 29.0*  MCV 104.6*  --   PLT 176  --    Basic Metabolic Panel: Recent Labs  Lab 04/23/21 1623 04/23/21 1629  NA 142 144  K 4.5 4.4  CL 113* 113*  CO2 20*  --   GLUCOSE 160* 152*  BUN 49* 45*  CREATININE 3.13* 3.30*  CALCIUM 9.0  --    GFR: CrCl cannot be calculated (Unknown ideal weight.). Liver Function Tests: Recent Labs  Lab 04/23/21 1623  AST 23  ALT 15  ALKPHOS 76  BILITOT 0.4  PROT 6.0*  ALBUMIN 3.5   No results for input(s): LIPASE, AMYLASE in the last 168 hours. No results for input(s): AMMONIA in the last 168 hours. Coagulation Profile: Recent Labs  Lab 04/23/21 1623  INR 1.0   Cardiac Enzymes: No results for input(s): CKTOTAL, CKMB, CKMBINDEX, TROPONINI in the last 168 hours. BNP (last 3 results) No results for input(s): PROBNP in the last 8760 hours. HbA1C: No results for input(s): HGBA1C in the last 72 hours. CBG: Recent Labs  Lab 04/23/21 1620  GLUCAP 137*   Lipid  Profile: No results for input(s): CHOL, HDL, LDLCALC, TRIG, CHOLHDL, LDLDIRECT in the last 72 hours. Thyroid Function Tests: No results for input(s): TSH, T4TOTAL, FREET4, T3FREE, THYROIDAB in the last 72 hours. Anemia Panel: No results for input(s): VITAMINB12, FOLATE, FERRITIN, TIBC, IRON, RETICCTPCT in the last 72 hours. Urine analysis:    Component Value Date/Time   BILIRUBINUR Negative 02/22/2020 1716   PROTEINUR Positive (A) 02/22/2020 1716   UROBILINOGEN 0.2 02/22/2020 1716   NITRITE Negative 02/22/2020 1716   LEUKOCYTESUR Negative 02/22/2020 1716    Radiological Exams on Admission: CT HEAD CODE STROKE WO CONTRAST  Result Date: 04/23/2021 CLINICAL DATA:  Code stroke. Acute neuro deficit. Left-sided gaze. Difficulty speaking. EXAM: CT  HEAD WITHOUT CONTRAST TECHNIQUE: Contiguous axial images were obtained from the base of the skull through the vertex without intravenous contrast. RADIATION DOSE REDUCTION: This exam was performed according to the departmental dose-optimization program which includes automated exposure control, adjustment of the mA and/or kV according to patient size and/or use of iterative reconstruction technique. COMPARISON:  None. FINDINGS: Brain: Mild atrophy. Moderate white matter hypodensity bilaterally. This is symmetric and most likely chronic microvascular ischemia. Negative for acute hemorrhage or mass. 8 mm hypodensity left thalamus compatible with infarct of indeterminate age. Vascular: Negative for hyperdense vessel Skull: Negative Sinuses/Orbits: Retention cyst right maxillary sinus. Remaining sinuses clear. Bilateral cataract extraction Other: None ASPECTS ([place] Stroke Program Early CT Score) - Ganglionic level infarction (caudate, lentiform nuclei, internal capsule, insula, M1-M3 cortex): 7 - Supraganglionic infarction (M4-M6 cortex): 3 Total score (0-10 with 10 being normal): 10 IMPRESSION: 1. 8 mm hypodensity left thalamus compatible with infarct of  indeterminate age. Possibly acute. No acute hemorrhage. 2. ASPECTS is 10 3. Atrophy and moderate chronic microvascular ischemic change. 4. Code stroke imaging results were communicated on 04/23/2021 at 4:39 pm to provider Palikh via text page Electronically Signed   By: Franchot Gallo M.D.   On: 04/23/2021 16:41    EKG: Independently reviewed.  Sinus, no acute ST changes.  Assessment/Plan Principal Problem:   Stroke (cerebrum) ([place])  (please populate well all problems here in Problem List. (For example, if patient is on BP meds at home and you resume or decide to hold them, it is a problem that needs to be her. Same for CAD, COPD, HLD and so on)  Right-sided paresthesia -Secondary to acute left ischemic thalamus stroke -Restart aspirin -Risk factor modifications, check A1c and lipid panel.  Continue statin, start sliding scale. -Allow permissive hypertension 200/110, as needed hydralazine. -Brain MRI and MRI without contrast. -Monitoring and echocardiogram.  Question of temporary right 3rd nerve palsy and aphasia -Cannot explain by left thalamus stroke, will check MRI to rule out other stroke source.  HTN -Uncontrolled, allow permissive hypertension for 48 hours.  IIDM -Sliding scale, check A1c.  CKD stage IIIb -Euvolemic, creatinine level stable.  No significant electrolyte or acid-base abnormalities.  Chronic normocytic anemia -H&H stable, might be related to CKD.  Outpatient follow-up with PCP and nephrology.  DVT prophylaxis: Heparin subcu Code Status: Full code Family Communication: Daughter at bedside Disposition Plan: Expect less than 2 midnight hospital stay. Consults called: Neurology Admission status: Telemetry observation   Rita Halt MD [facility] Pager [phone]  04/23/2021, 5:45 PM

## 2021-04-23 NOTE — ED Notes (Signed)
Pt back on monitor. Family at bedside.

## 2021-04-23 NOTE — ED Notes (Signed)
Pt returned from MRI °

## 2021-04-24 ENCOUNTER — Observation Stay (HOSPITAL_COMMUNITY): Payer: Medicare Other

## 2021-04-24 DIAGNOSIS — I609 Nontraumatic subarachnoid hemorrhage, unspecified: Secondary | ICD-10-CM | POA: Diagnosis present

## 2021-04-24 DIAGNOSIS — R739 Hyperglycemia, unspecified: Secondary | ICD-10-CM | POA: Diagnosis not present

## 2021-04-24 DIAGNOSIS — D631 Anemia in chronic kidney disease: Secondary | ICD-10-CM | POA: Diagnosis not present

## 2021-04-24 DIAGNOSIS — E039 Hypothyroidism, unspecified: Secondary | ICD-10-CM | POA: Diagnosis not present

## 2021-04-24 DIAGNOSIS — I639 Cerebral infarction, unspecified: Secondary | ICD-10-CM | POA: Diagnosis present

## 2021-04-24 DIAGNOSIS — R079 Chest pain, unspecified: Secondary | ICD-10-CM

## 2021-04-24 DIAGNOSIS — R29707 NIHSS score 7: Secondary | ICD-10-CM | POA: Diagnosis not present

## 2021-04-24 DIAGNOSIS — N184 Chronic kidney disease, stage 4 (severe): Secondary | ICD-10-CM | POA: Diagnosis present

## 2021-04-24 DIAGNOSIS — N390 Urinary tract infection, site not specified: Secondary | ICD-10-CM | POA: Diagnosis present

## 2021-04-24 DIAGNOSIS — I69351 Hemiplegia and hemiparesis following cerebral infarction affecting right dominant side: Secondary | ICD-10-CM | POA: Diagnosis not present

## 2021-04-24 DIAGNOSIS — I69320 Aphasia following cerebral infarction: Secondary | ICD-10-CM | POA: Diagnosis not present

## 2021-04-24 DIAGNOSIS — N179 Acute kidney failure, unspecified: Secondary | ICD-10-CM | POA: Diagnosis present

## 2021-04-24 DIAGNOSIS — R29709 NIHSS score 9: Secondary | ICD-10-CM | POA: Diagnosis not present

## 2021-04-24 DIAGNOSIS — R414 Neurologic neglect syndrome: Secondary | ICD-10-CM | POA: Diagnosis present

## 2021-04-24 DIAGNOSIS — G8191 Hemiplegia, unspecified affecting right dominant side: Secondary | ICD-10-CM | POA: Diagnosis present

## 2021-04-24 DIAGNOSIS — N1832 Chronic kidney disease, stage 3b: Secondary | ICD-10-CM | POA: Diagnosis not present

## 2021-04-24 DIAGNOSIS — R29710 NIHSS score 10: Secondary | ICD-10-CM | POA: Diagnosis not present

## 2021-04-24 DIAGNOSIS — E1122 Type 2 diabetes mellitus with diabetic chronic kidney disease: Secondary | ICD-10-CM | POA: Diagnosis not present

## 2021-04-24 DIAGNOSIS — D472 Monoclonal gammopathy: Secondary | ICD-10-CM | POA: Diagnosis present

## 2021-04-24 DIAGNOSIS — R29706 NIHSS score 6: Secondary | ICD-10-CM | POA: Diagnosis not present

## 2021-04-24 DIAGNOSIS — K5900 Constipation, unspecified: Secondary | ICD-10-CM | POA: Diagnosis not present

## 2021-04-24 DIAGNOSIS — E669 Obesity, unspecified: Secondary | ICD-10-CM | POA: Diagnosis not present

## 2021-04-24 DIAGNOSIS — I480 Paroxysmal atrial fibrillation: Secondary | ICD-10-CM | POA: Diagnosis not present

## 2021-04-24 DIAGNOSIS — I129 Hypertensive chronic kidney disease with stage 1 through stage 4 chronic kidney disease, or unspecified chronic kidney disease: Secondary | ICD-10-CM | POA: Diagnosis not present

## 2021-04-24 DIAGNOSIS — I10 Essential (primary) hypertension: Secondary | ICD-10-CM | POA: Diagnosis not present

## 2021-04-24 DIAGNOSIS — R200 Anesthesia of skin: Secondary | ICD-10-CM | POA: Diagnosis present

## 2021-04-24 DIAGNOSIS — R29711 NIHSS score 11: Secondary | ICD-10-CM | POA: Diagnosis not present

## 2021-04-24 DIAGNOSIS — R29700 NIHSS score 0: Secondary | ICD-10-CM | POA: Diagnosis present

## 2021-04-24 DIAGNOSIS — I4892 Unspecified atrial flutter: Secondary | ICD-10-CM | POA: Diagnosis not present

## 2021-04-24 DIAGNOSIS — I4891 Unspecified atrial fibrillation: Secondary | ICD-10-CM | POA: Diagnosis not present

## 2021-04-24 DIAGNOSIS — Z20822 Contact with and (suspected) exposure to covid-19: Secondary | ICD-10-CM | POA: Diagnosis present

## 2021-04-24 DIAGNOSIS — E11649 Type 2 diabetes mellitus with hypoglycemia without coma: Secondary | ICD-10-CM | POA: Diagnosis not present

## 2021-04-24 LAB — CBG MONITORING, ED
Glucose-Capillary: 87 mg/dL (ref 70–99)
Glucose-Capillary: 79 mg/dL (ref 70–99)
Glucose-Capillary: 64 mg/dL — ABNORMAL LOW (ref 70–99)
Glucose-Capillary: 74 mg/dL (ref 70–99)
Glucose-Capillary: 61 mg/dL — ABNORMAL LOW (ref 70–99)
Glucose-Capillary: 75 mg/dL (ref 70–99)

## 2021-04-24 LAB — LIPID PANEL
Cholesterol: 130 mg/dL (ref 0–200)
HDL: 34 mg/dL — ABNORMAL LOW (ref >40)
LDL Cholesterol: 78 mg/dL (ref 0–99)
Total CHOL/HDL Ratio: 3.8 RATIO
Triglycerides: 90 mg/dL (ref <150)
VLDL: 18 mg/dL (ref 0–40)

## 2021-04-24 LAB — ECHOCARDIOGRAM COMPLETE
AR max vel: 2.3 cm2
AV Area VTI: 2.7 cm2
AV Area mean vel: 2.36 cm2
AV Mean grad: 5 mmHg
AV Peak grad: 10.2 mmHg
Ao pk vel: 1.6 m/s
Area-P 1/2: 1.94 cm2
S' Lateral: 2.8 cm

## 2021-04-24 LAB — HEMOGLOBIN A1C
Hgb A1c MFr Bld: 4.5 % — ABNORMAL LOW (ref 4.8–5.6)
Mean Plasma Glucose: 82.45 mg/dL

## 2021-04-24 LAB — RESP PANEL BY RT-PCR (FLU A&B, COVID) ARPGX2
Influenza A by PCR: NEGATIVE
Influenza B by PCR: NEGATIVE
SARS Coronavirus 2 by RT PCR: NEGATIVE

## 2021-04-24 LAB — GLUCOSE, CAPILLARY: Glucose-Capillary: 83 mg/dL (ref 70–99)

## 2021-04-24 LAB — TSH: TSH: 5.275 u[IU]/mL — ABNORMAL HIGH (ref 0.350–4.500)

## 2021-04-24 MED ORDER — CLOPIDOGREL BISULFATE 75 MG PO TABS
75.0000 mg | ORAL_TABLET | Freq: Every day | ORAL | Status: DC
  Administered 2021-04-24 – 2021-04-27 (×4): 75 mg via ORAL
  Filled 2021-04-24 (×4): qty 1

## 2021-04-24 MED ORDER — LEVETIRACETAM 500 MG PO TABS
500.0000 mg | ORAL_TABLET | Freq: Two times a day (BID) | ORAL | Status: DC
  Administered 2021-04-24 – 2021-05-02 (×17): 500 mg via ORAL
  Filled 2021-04-24 (×17): qty 1

## 2021-04-24 MED ORDER — LEVETIRACETAM IN NACL 1000 MG/100ML IV SOLN
1000.0000 mg | INTRAVENOUS | Status: AC
  Administered 2021-04-24 (×2): 1000 mg via INTRAVENOUS
  Filled 2021-04-24 (×2): qty 100

## 2021-04-24 MED ORDER — ATORVASTATIN CALCIUM 40 MG PO TABS
40.0000 mg | ORAL_TABLET | Freq: Every day | ORAL | Status: DC
  Administered 2021-04-24 – 2021-05-02 (×9): 40 mg via ORAL
  Filled 2021-04-24 (×9): qty 1

## 2021-04-24 MED ORDER — SODIUM CHLORIDE 0.9 % IV SOLN: 2000.0000 mg | Freq: Once | INTRAVENOUS | Status: DC

## 2021-04-24 NOTE — Progress Notes (Addendum)
STROKE TEAM PROGRESS NOTE   INTERVAL HISTORY Her daughter is at the bedside.  Patient had an event overnight in which she had worsening dysarthria and rhythmic R hand twitching concerning for a focal seizure, so she was loaded with Keppra 2000 mg IV x 1 and initiated on maintenance Keppra 500 mg BID and scheduled to have EEG in the AM.   Patient's daughter reports that patient had word finding difficulties, R arm tingling and weakness, discontinuation of speech, and lethargy so she was brought to ED. It is documented that while in the waiting room, she developed gaze deviation, SBP >200. By the time she went to CT, sx resolved.   Vitals:   04/24/21 0100 04/24/21 0200 04/24/21 0300 04/24/21 0640  BP: (!) 165/68 (!) 157/64 (!) 155/86 (!) 161/144  Pulse: 60 (!) 49 (!) 53 (!) 47  Resp: 19 18 15 14   Temp:      SpO2: 100% 99% 100% 100%   CBC:  Recent Labs  Lab 04/23/21 1623 04/23/21 1629  WBC 9.7  --   NEUTROABS 5.7  --   HGB 9.7* 9.9*  HCT 31.8* 29.0*  MCV 104.6*  --   PLT 176  --    Basic Metabolic Panel:  Recent Labs  Lab 04/23/21 1623 04/23/21 1629  NA 142 144  K 4.5 4.4  CL 113* 113*  CO2 20*  --   GLUCOSE 160* 152*  BUN 49* 45*  CREATININE 3.13* 3.30*  CALCIUM 9.0  --    Lipid Panel:  Recent Labs  Lab 04/24/21 0258  CHOL 130  TRIG 90  HDL 34*  CHOLHDL 3.8  VLDL 18  LDLCALC 78   HgbA1c:  Recent Labs  Lab 04/24/21 0258  HGBA1C 4.5*   Urine Drug Screen: No results for input(s): LABOPIA, COCAINSCRNUR, LABBENZ, AMPHETMU, THCU, LABBARB in the last 168 hours.  Alcohol Level  Recent Labs  Lab 04/23/21 1623  ETH <10    IMAGING past 24 hours MR ANGIO HEAD WO CONTRAST  Result Date: 04/23/2021 CLINICAL DATA:  Provided history: Stroke, follow-up. EXAM: MRA HEAD WITHOUT CONTRAST TECHNIQUE: Angiographic images of the Circle of Willis were acquired using MRA technique without intravenous contrast. COMPARISON:  No pertinent prior exam. FINDINGS: Anterior  circulation: The intracranial internal carotid arteries are patent. The M1 middle cerebral arteries are patent. Severe stenosis within the mid-to-distal M1 left MCA. No M2 proximal branch occlusion is identified. Atherosclerotic irregularity of the M2 and more distal MCA vessels on the left. The anterior cerebral arteries are patent. No intracranial aneurysm is identified. Posterior circulation: The intracranial vertebral arteries are patent. The basilar artery is patent. The posterior cerebral arteries are patent. Atherosclerotic irregularity of both vessels. Most notably, there are multiple sites of moderate/severe stenosis within the P2 left PCA. Posterior communicating arteries are present bilaterally. Anatomic variants: None significant. IMPRESSION: No intracranial large vessel occlusion is identified. Intracranial atherosclerotic disease with multifocal stenoses, most notably as follows. Multiple sites of moderate/severe stenosis within the P2 left PCA. Severe stenosis within the mid-to-distal M1 left MCA. Electronically Signed   By: Kellie Simmering D.O.   On: 04/23/2021 19:18   MR ANGIO NECK WO CONTRAST  Result Date: 04/23/2021 CLINICAL DATA:  Provided history: Stroke, follow-up. EXAM: MRA NECK WITHOUT CONTRAST TECHNIQUE: Angiographic images of the neck were acquired using MRA technique without intravenous contrast. Carotid stenosis measurements (when applicable) are obtained utilizing NASCET criteria, using the distal internal carotid diameter as the denominator. COMPARISON:  Noncontrast head CT performed  earlier today 04/23/2021. Concurrently performed MRA of the head. FINDINGS: Examination limited by motion degradation and non-contrast technique. Common origin of the innominate and left common carotid arteries. Motion degradation and non-contrast technique limits evaluation of the proximal right common carotid artery. Within this limitation, the common carotid and internal carotid arteries appear patent  within the neck without hemodynamically significant stenosis. Mild atherosclerotic plaque within the mid left CCA. Mild irregularity of the cervical internal carotid arteries, bilaterally, which may reflect atherosclerotic disease or sequela of fibromuscular dysplasia. Motion degradation and non-contrast technique limits evaluation of the origins of the vertebral arteries. Elsewhere, the dominant left vertebral artery is patent within the neck without appreciable stenosis. The right vertebral artery is developmentally diminutive, but appears patent within the neck. IMPRESSION: Examination limited by motion degradation and non-contrast technique. No appreciable hemodynamically significant stenosis within the common carotid or internal carotid arteries within the neck. Mild atherosclerotic plaque within the mid left common carotid artery. Mild irregularity of the mid cervical internal carotid arteries, bilaterally, which may reflect atherosclerotic disease or sequela of fibromuscular dysplasia. Additionally, there is a partially retropharyngeal course of the cervical internal carotid arteries. Motion degradation and non-contrast technique limits evaluation of the vertebral artery origins. Elsewhere, the dominant left vertebral artery is patent within the neck without appreciable stenosis. The right vertebral artery is developmentally diminutive, but appears patent within the neck. Electronically Signed   By: Kellie Simmering D.O.   On: 04/23/2021 19:27   MR BRAIN WO CONTRAST  Result Date: 04/23/2021 CLINICAL DATA:  Provided history: Stroke, follow-up. EXAM: MRI HEAD WITHOUT CONTRAST TECHNIQUE: Multiplanar, multiecho pulse sequences of the brain and surrounding structures were obtained without intravenous contrast. COMPARISON:  Noncontrast head CT performed earlier today 04/23/2021. Concurrently performed brain MRI and MRA head 04/23/2021. FINDINGS: Mild intermittent motion degradation. Brain: Mild generalized cerebral  and cerebellar atrophy. Small patchy acute cortical/subcortical infarcts within the left parietal lobe. The largest infarct at this site is cortically-based, measuring 2.6 cm in greatest dimension (series 3, image 8). Background moderate to moderately advanced patchy and confluent T2 FLAIR hyperintense signal abnormality within the cerebral white matter, nonspecific but compatible chronic small vessel ischemic disease. Chronic small vessel ischemic changes are also present within the bilateral deep gray nuclei. Additionally, mild chronic small vessel ischemic changes are present within the pons. There is mild chronic hemosiderin deposition along the right parietooccipital lobes, suggesting remote subarachnoid hemorrhage at this site. Additionally, there are clustered chronic parenchymal microhemorrhages within the right parietal and occipital lobes. There are a few additional punctate chronic microhemorrhages elsewhere within the supratentorial and infratentorial brain. No evidence of an intracranial mass. No extra-axial fluid collection. No midline shift. Vascular: Maintained flow voids within the proximal large arterial vessels. Skull and upper cervical spine: No focal suspicious marrow lesion. Mild C3-C4 grade 1 retrolisthesis. Incompletely assessed cervical spondylosis. Sinuses/Orbits: Visualized orbits show no acute finding. Bilateral ocular lens replacements. 18 mm mucous retention cyst within the right maxillary sinus. Trace mucosal thickening within the bilateral ethmoid sinuses. IMPRESSION: Small patchy acute cortical/subcortical infarcts within the left parietal lobe. Background moderate to moderately advanced chronic small vessel ischemic changes within the cerebral white matter. To a lesser degree, chronic small vessel ischemic changes are also present within the bilateral deep gray nuclei and within the pons. Mild chronic hemosiderin deposition along the right parietooccipital lobes, suggesting remote  subarachnoid hemorrhage at this site. Additionally, clustered chronic parenchymal microhemorrhages are present within the right parietooccipital lobes. Findings are nonspecific, but may reflect  manifestations of cerebral amyloid angiopathy. Mild generalized parenchymal atrophy. Paranasal sinus disease, as described. Electronically Signed   By: Kellie Simmering D.O.   On: 04/23/2021 19:38   CT HEAD CODE STROKE WO CONTRAST  Result Date: 04/23/2021 CLINICAL DATA:  Code stroke. Acute neuro deficit. Left-sided gaze. Difficulty speaking. EXAM: CT HEAD WITHOUT CONTRAST TECHNIQUE: Contiguous axial images were obtained from the base of the skull through the vertex without intravenous contrast. RADIATION DOSE REDUCTION: This exam was performed according to the departmental dose-optimization program which includes automated exposure control, adjustment of the mA and/or kV according to patient size and/or use of iterative reconstruction technique. COMPARISON:  None. FINDINGS: Brain: Mild atrophy. Moderate white matter hypodensity bilaterally. This is symmetric and most likely chronic microvascular ischemia. Negative for acute hemorrhage or mass. 8 mm hypodensity left thalamus compatible with infarct of indeterminate age. Vascular: Negative for hyperdense vessel Skull: Negative Sinuses/Orbits: Retention cyst right maxillary sinus. Remaining sinuses clear. Bilateral cataract extraction Other: None ASPECTS ([place] Stroke Program Early CT Score) - Ganglionic level infarction (caudate, lentiform nuclei, internal capsule, insula, M1-M3 cortex): 7 - Supraganglionic infarction (M4-M6 cortex): 3 Total score (0-10 with 10 being normal): 10 IMPRESSION: 1. 8 mm hypodensity left thalamus compatible with infarct of indeterminate age. Possibly acute. No acute hemorrhage. 2. ASPECTS is 10 3. Atrophy and moderate chronic microvascular ischemic change. 4. Code stroke imaging results were communicated on 04/23/2021 at 4:39 pm to provider Palikh  via text page Electronically Signed   By: Franchot Gallo M.D.   On: 04/23/2021 16:41    PHYSICAL EXAM  Physical Exam  Constitutional: Appears well-developed and well-nourished elderly African-American woman in no acute distress.  Psych: Affect appropriate to situation Eyes: No scleral injection HENT: No OP obstrucion MSK: no joint deformities.  Cardiovascular: Normal rate and regular rhythm.  Respiratory: Effort normal, non-labored breathing GI: Soft.  No distension. There is no tenderness.  Skin: WDI  Neuro: Mental Status: Patient is awake, alert, oriented to person, but not place, month, year, nor situation Patient is unable to give a clear and coherent history. Defers to daughter. Signs of both aphasia and R sided neglect. 0/3 3-item recall Difficulty naming objects; unable to name "paper" and "thumb" Only able to give 2 animals that walk on 4 legs. Dysarthria and aphasia on repetition.  Cranial Nerves: II: Blinks to threat on the L and inconsistently on the R. III,IV, VI: EOM with L sided gaze preference and R palsy without ptosis or diploplia.  V: Facial sensation is symmetric to light touch VII: Facial movement is with R sided droop resting and smiling VIII: Hearing is intact to voice X: Palate elevates symmetrically XI: Shoulder shrug is weak, L>R XII: Tongue protrudes midline without atrophy or fasciculations.  Motor: Tone is normal. Bulk is normal. 4/5 strength was present in LUE and BLE. RUE 0/5. Sensory: Patient reports sensation is felt bilaterally to light touch in the arms and legs. However, extinction to DSS present, as patient only reports this writer touching her L side. Cerebellar: Patient uncooperative with assessment on L, R sided hemi-neglect Gait: Deferred   ASSESSMENT/PLAN [name] is a 81 y.o. female with history of HTN, T2DM, HLD, CKD stage IIIb, normocytic anemia, presenting with generalized weakness and right hand tingling.    Overnight: rhythmic R hand twitching that she is unable to volitionally stop and is concerning for a focal seizure- EEG and keppra ordered  Stroke:  left sided patchy infarcts likely secondary to embolic source Code  Stroke 8 mm hypodensity left thalamus compatible with infarct of indeterminate age. Possibly acute. No acute hemorrhage, SVD MRI small patchy acute cortical/subcortical infarcts within the left parietal lobe, small vessel ischemic changes, possible SAH along the right parietoccipital lobe Micro hemorrhages MRA  No significant stenosis, no LVO, posterior circulation hypoplastic, fetal pattern of origin 2D Echo Pending Labs pending: HIV, TSH, RPR LDL 78 HgbA1c 4.5 VTE prophylaxis - SCDs    Diet   Diet Heart Room service appropriate? Yes; Fluid consistency: Thin   No antithrombotic prior to admission, now on aspirin 81 mg daily. ASA 81mg . DAPT not recommended  Therapy recommendations:  Pending PT/OT/SLP Disposition:  Pending PT/OT  Hypertension Home meds:  HCTZ 12.5 mg, Imdur 30 mg Unstable Permissive hypertension (OK if < 220/120) but gradually normalize in 5-7 days Long-term BP goal normotensive  Hyperlipidemia Home meds:  Atorvastatin 20mg , resumed in hospital LDL 78, goal < 70 Increase to Atrorvastatin 40mg   Continue statin at discharge  Possible Seizure Activity Keppra load 2000mg  then 500BID EEG unable to be obtained 2/2 patient's hair   Other Stroke Risk Factors Advanced Age >/= 65  ETOH use, alcohol level <10, advised to drink no more than 1-2 drink(s) a day Substance abuse - UDS:  THC No results found for requested labs within last 26280 hours., Cocaine No results found for requested labs within last 26280 hours..  Obesity, There is no height or weight on file to calculate BMI., BMI >/= 30 associated with increased stroke risk, recommend weight loss, diet and exercise as appropriate   Other Active Problems CKD IIIb Chronic normocytic anemia Cognitive  impairment likely underlying amyloid angiopathy  Hospital day # 0  Rosezetta Schlatter, MD PGY-1 Neuro Psych Resident 04/24/2021  9:23 AM  STROKE MD NOTE : I have personally obtained history,examined this patient, reviewed notes, independently viewed imaging studies, participated in medical decision making and plan of care.ROS completed by me personally and pertinent positives fully documented  I have made any additions or clarifications directly to the above note. Agree with note above.  Patient presented with slurred speech and word finding difficulties which resolved after arrival with subsequently had focal seizures and was loaded with Keppra.  MRI scan shows patchy left parietal embolic infarct.  Patient cannot have an EEG due to a week which is due to her head.  She remains at risk for recurrent seizures and strokes and continue Keppra for seizures and ongoing stroke work-up.  Recommend aspirin alone due to increased risk for bleeding and aggressive risk factor modification.  Long discussion with patient and daughter at the bedside and answered questions.  Patient appears to have cognitive impairment and lives alone and MRI scan brain and echo images multiple microhemorrhages and hemosiderin deposition in the right parietal occipital lobes suggestive of possible underlying amyloid angiopathy and dementia hence she is not a good long-term anticoagulation candidate hence we will not do long-term cardiac monitoring for A-fib.  Greater than 50% time during this 50-minute visit spent in counseling and coordination of care about her embolic stroke and seizures and answered questions.  Antony Contras, MD Medical Director [facility] Pager: [phone] 04/24/2021 4:20 PM   To contact Stroke Continuity provider, please refer to [email or web address] After hours, contact General Neurology

## 2021-04-24 NOTE — Progress Notes (Signed)
Echocardiogram 2D Echocardiogram has been performed.  Arlyss Gandy 04/24/2021, 9:49 AM

## 2021-04-24 NOTE — Plan of Care (Signed)
°  Problem: Education: Goal: Knowledge of disease or condition will improve Outcome: Progressing Goal: Knowledge of secondary prevention will improve (SELECT ALL) Outcome: Progressing Goal: Knowledge of patient specific risk factors will improve (INDIVIDUALIZE FOR PATIENT) Outcome: Progressing Goal: Individualized Educational Video(s) Outcome: Progressing   Problem: Coping: Goal: Will verbalize positive feelings about self Outcome: Progressing Goal: Will identify appropriate support needs Outcome: Progressing   Problem: Health Behavior/Discharge Planning: Goal: Ability to manage health-related needs will improve Outcome: Progressing   Problem: Self-Care: Goal: Ability to participate in self-care as condition permits will improve Outcome: Progressing Goal: Verbalization of feelings and concerns over difficulty with self-care will improve Outcome: Progressing Goal: Ability to communicate needs accurately will improve Outcome: Progressing   Problem: Nutrition: Goal: Risk of aspiration will decrease Outcome: Progressing   Problem: Ischemic Stroke/TIA Tissue Perfusion: Goal: Complications of ischemic stroke/TIA will be minimized Outcome: Progressing

## 2021-04-24 NOTE — Progress Notes (Signed)
Brief Neuro Update:  Was notified of worsening dysarthria. On evaluation, patient is awake, alert, oriented to self and place but not to month and year. She is able to name most objects. She has rhythmic R hand twitching that she is unable to volitionally stop and is concerning for a focal seizure.  Recs: - Keppra 2000mg  IV once and started on Keppra 500mg  BID. - routine EEG in AM.  [place] Pager Number [phone]

## 2021-04-24 NOTE — ED Notes (Signed)
OJ with 2 sugar packets and gingerale given for CBG 61. Patient remains A/O. Speech improving.

## 2021-04-24 NOTE — Progress Notes (Addendum)
Progress Note  Patient: Rita Yoder WEX:[id number] DOB: 02-06-1941  DOA: 04/23/2021  DOS: 04/24/2021    Brief hospital course: Rita Yoder is a 81 y.o. female with medical history significant of HTN, IIDM, HLD, stage IIIb, normocytic anemia, came with new onset of right arm and hand numbness tingling.  Symptoms started around 1 PM, she denies any weakness of any of the limbs.  According to daughter, patient was briefly not talking and gaze to the right side but resolved soon.  At that time patient arrived in the ED, most of her symptoms of right-sided numbness has resolved.  Denies any headache or blurry vision.  She used to take aspirin which was discontinued by PCP several years ago.  Daughter reported patient blood pressure has been fairly controlled at home SBP= 140s.   ED Course: Blood pressure elevated SBP>160.  Creatinine 3.1, bicarb 20, BUN 49, glucose 160 hemoglobin 9.7 compared to baseline 10.3.   CT had hypodensity of left thalamus suspicious for acute/subacute stroke.  Assessment and Plan: Acute left ischemic thalamus stroke: Also left parietal cortical and subcortical infarcts. Could consider loop recorder for suspicion of cardioembolic mechanism, though there is concern for right parietal microhemorrhages suggestive of cerebral amyloid angiopathy and previous SAH. Pt is a poor long term anticoagulation candidate per neurology, so will continue with aspirin alone.  - Continue ASA - LDL 78, slightly above goal of 70, augment atorvastatin 20mg  > 40mg . - Permissive HTN as below - Echo still pending - PT recommends CIR at discharge.    Suspicion for focal seizure:  - Continue keppra. Pt's wig is not able to be removed for EEG.   HTN:  - Permissive HTN x48 hours.    T2DM: HbA1c 4.5% - SSI   CKD stage IIIb -Euvolemic, creatinine level stable.  No significant electrolyte or acid-base abnormalities.   Anemia of CKD: Stable - Outpatient follow-up with PCP and  nephrology.  Obesity: Estimated body mass index is 40.49 kg/m as calculated from the following:   Height as of 03/25/21: 5\' 1"  (1.549 m).   Weight as of 03/25/21: 97.2 kg.  Subjective: Sparse speech, denies pain, not hungry, did get some sleep last night per family at bedside. Had right hand twitching as noted overnight, none now.  Objective: Vitals:   04/24/21 0640 04/24/21 1035 04/24/21 1100 04/24/21 1500  BP: (!) 161/144 (!) 156/80 (!) 174/75 (!) 171/83  Pulse: (!) 47 (!) 49 (!) 43 (!) 48  Resp: 14 17 15 12   Temp:   98.7 F (37.1 C) 98.2 F (36.8 C)  TempSrc:   Oral Oral  SpO2: 100% 100% 100% 100%   Gen: Elderly obese female in no distress Pulm: Nonlabored breathing room air.  CV: Regular rate and rhythm. No murmur, rub, or gallop. No JVD, no pitting dependent edema. GI: Abdomen soft, non-tender, non-distended, with normoactive bowel sounds.  Ext: Warm, no new deformities Skin: No rashes, lesions or ulcers on visualized skin. Neuro: Alert, do not suspect she is oriented. Appears to neglect right arm and visual field, though does not let right arm drop when passively raised, follows only some commands. Psych: Judgement and insight appear impaired. Mood euthymic & affect congruent. Behavior is appropriate.    Data Personally reviewed:  CBC: Recent Labs  Lab 04/23/21 1623 04/23/21 1629  WBC 9.7  --   NEUTROABS 5.7  --   HGB 9.7* 9.9*  HCT 31.8* 29.0*  MCV 104.6*  --   PLT 176  --  Basic Metabolic Panel: Recent Labs  Lab 04/23/21 1623 04/23/21 1629  NA 142 144  K 4.5 4.4  CL 113* 113*  CO2 20*  --   GLUCOSE 160* 152*  BUN 49* 45*  CREATININE 3.13* 3.30*  CALCIUM 9.0  --    GFR: CrCl cannot be calculated (Unknown ideal weight.). Liver Function Tests: Recent Labs  Lab 04/23/21 1623  AST 23  ALT 15  ALKPHOS 76  BILITOT 0.4  PROT 6.0*  ALBUMIN 3.5   No results for input(s): LIPASE, AMYLASE in the last 168 hours. No results for input(s): AMMONIA in  the last 168 hours. Coagulation Profile: Recent Labs  Lab 04/23/21 1623  INR 1.0   Cardiac Enzymes: No results for input(s): CKTOTAL, CKMB, CKMBINDEX, TROPONINI in the last 168 hours. BNP (last 3 results) No results for input(s): PROBNP in the last 8760 hours. HbA1C: Recent Labs    04/24/21 0258  HGBA1C 4.5*   CBG: Recent Labs  Lab 04/23/21 1620 04/24/21 0322 04/24/21 0737 04/24/21 1114 04/24/21 1208  GLUCAP 137* 87 79 64* 74   Lipid Profile: Recent Labs    04/24/21 0258  CHOL 130  HDL 34*  LDLCALC 78  TRIG 90  CHOLHDL 3.8   Thyroid Function Tests: No results for input(s): TSH, T4TOTAL, FREET4, T3FREE, THYROIDAB in the last 72 hours. Anemia Panel: No results for input(s): VITAMINB12, FOLATE, FERRITIN, TIBC, IRON, RETICCTPCT in the last 72 hours. Urine analysis:    Component Value Date/Time   BILIRUBINUR Negative 02/22/2020 1716   PROTEINUR Positive (A) 02/22/2020 1716   UROBILINOGEN 0.2 02/22/2020 1716   NITRITE Negative 02/22/2020 1716   LEUKOCYTESUR Negative 02/22/2020 1716   Recent Results (from the past 240 hour(s))  Resp Panel by RT-PCR (Flu A&B, Covid) Nasopharyngeal Swab     Status: None   Collection Time: 04/23/21  4:20 PM   Specimen: Nasopharyngeal Swab; Nasopharyngeal(NP) swabs in vial transport medium  Result Value Ref Range Status   SARS Coronavirus 2 by RT PCR NEGATIVE NEGATIVE Final    Comment: (NOTE) SARS-CoV-2 target nucleic acids are NOT DETECTED.  The SARS-CoV-2 RNA is generally detectable in upper respiratory specimens during the acute phase of infection. The lowest concentration of SARS-CoV-2 viral copies this assay can detect is 138 copies/mL. A negative result does not preclude SARS-Cov-2 infection and should not be used as the sole basis for treatment or other patient management decisions. A negative result may occur with  improper specimen collection/handling, submission of specimen other than nasopharyngeal swab, presence of  viral mutation(s) within the areas targeted by this assay, and inadequate number of viral copies(<138 copies/mL). A negative result must be combined with clinical observations, patient history, and epidemiological information. The expected result is Negative.  Fact Sheet for Patients:  EntrepreneurPulse.com.au  Fact Sheet for Healthcare Providers:  IncredibleEmployment.be  This test is no t yet approved or cleared by the [place] FDA and  has been authorized for detection and/or diagnosis of SARS-CoV-2 by FDA under an Emergency Use Authorization (EUA). This EUA will remain  in effect (meaning this test can be used) for the duration of the COVID-19 declaration under Section 564(b)(1) of the Act, 21 U.S.C.section 360bbb-3(b)(1), unless the authorization is terminated  or revoked sooner.       Influenza A by PCR NEGATIVE NEGATIVE Final   Influenza B by PCR NEGATIVE NEGATIVE Final    Comment: (NOTE) The Xpert Xpress SARS-CoV-2/FLU/RSV plus assay is intended as an aid in the diagnosis of influenza from  Nasopharyngeal swab specimens and should not be used as a sole basis for treatment. Nasal washings and aspirates are unacceptable for Xpert Xpress SARS-CoV-2/FLU/RSV testing.  Fact Sheet for Patients: EntrepreneurPulse.com.au  Fact Sheet for Healthcare Providers: IncredibleEmployment.be  This test is not yet approved or cleared by the [place] FDA and has been authorized for detection and/or diagnosis of SARS-CoV-2 by FDA under an Emergency Use Authorization (EUA). This EUA will remain in effect (meaning this test can be used) for the duration of the COVID-19 declaration under Section 564(b)(1) of the Act, 21 U.S.C. section 360bbb-3(b)(1), unless the authorization is terminated or revoked.  Performed at [facility], [place] [address], [place], [place] [address]      MR ANGIO HEAD WO  CONTRAST  Result Date: 04/23/2021 CLINICAL DATA:  Provided history: Stroke, follow-up. EXAM: MRA HEAD WITHOUT CONTRAST TECHNIQUE: Angiographic images of the Circle of Willis were acquired using MRA technique without intravenous contrast. COMPARISON:  No pertinent prior exam. FINDINGS: Anterior circulation: The intracranial internal carotid arteries are patent. The M1 middle cerebral arteries are patent. Severe stenosis within the mid-to-distal M1 left MCA. No M2 proximal branch occlusion is identified. Atherosclerotic irregularity of the M2 and more distal MCA vessels on the left. The anterior cerebral arteries are patent. No intracranial aneurysm is identified. Posterior circulation: The intracranial vertebral arteries are patent. The basilar artery is patent. The posterior cerebral arteries are patent. Atherosclerotic irregularity of both vessels. Most notably, there are multiple sites of moderate/severe stenosis within the P2 left PCA. Posterior communicating arteries are present bilaterally. Anatomic variants: None significant. IMPRESSION: No intracranial large vessel occlusion is identified. Intracranial atherosclerotic disease with multifocal stenoses, most notably as follows. Multiple sites of moderate/severe stenosis within the P2 left PCA. Severe stenosis within the mid-to-distal M1 left MCA. Electronically Signed   By: Kellie Simmering D.O.   On: 04/23/2021 19:18   MR ANGIO NECK WO CONTRAST  Result Date: 04/23/2021 CLINICAL DATA:  Provided history: Stroke, follow-up. EXAM: MRA NECK WITHOUT CONTRAST TECHNIQUE: Angiographic images of the neck were acquired using MRA technique without intravenous contrast. Carotid stenosis measurements (when applicable) are obtained utilizing NASCET criteria, using the distal internal carotid diameter as the denominator. COMPARISON:  Noncontrast head CT performed earlier today 04/23/2021. Concurrently performed MRA of the head. FINDINGS: Examination limited by motion  degradation and non-contrast technique. Common origin of the innominate and left common carotid arteries. Motion degradation and non-contrast technique limits evaluation of the proximal right common carotid artery. Within this limitation, the common carotid and internal carotid arteries appear patent within the neck without hemodynamically significant stenosis. Mild atherosclerotic plaque within the mid left CCA. Mild irregularity of the cervical internal carotid arteries, bilaterally, which may reflect atherosclerotic disease or sequela of fibromuscular dysplasia. Motion degradation and non-contrast technique limits evaluation of the origins of the vertebral arteries. Elsewhere, the dominant left vertebral artery is patent within the neck without appreciable stenosis. The right vertebral artery is developmentally diminutive, but appears patent within the neck. IMPRESSION: Examination limited by motion degradation and non-contrast technique. No appreciable hemodynamically significant stenosis within the common carotid or internal carotid arteries within the neck. Mild atherosclerotic plaque within the mid left common carotid artery. Mild irregularity of the mid cervical internal carotid arteries, bilaterally, which may reflect atherosclerotic disease or sequela of fibromuscular dysplasia. Additionally, there is a partially retropharyngeal course of the cervical internal carotid arteries. Motion degradation and non-contrast technique limits evaluation of the vertebral artery origins. Elsewhere, the dominant left vertebral  artery is patent within the neck without appreciable stenosis. The right vertebral artery is developmentally diminutive, but appears patent within the neck. Electronically Signed   By: Kellie Simmering D.O.   On: 04/23/2021 19:27   MR BRAIN WO CONTRAST  Result Date: 04/23/2021 CLINICAL DATA:  Provided history: Stroke, follow-up. EXAM: MRI HEAD WITHOUT CONTRAST TECHNIQUE: Multiplanar, multiecho pulse  sequences of the brain and surrounding structures were obtained without intravenous contrast. COMPARISON:  Noncontrast head CT performed earlier today 04/23/2021. Concurrently performed brain MRI and MRA head 04/23/2021. FINDINGS: Mild intermittent motion degradation. Brain: Mild generalized cerebral and cerebellar atrophy. Small patchy acute cortical/subcortical infarcts within the left parietal lobe. The largest infarct at this site is cortically-based, measuring 2.6 cm in greatest dimension (series 3, image 8). Background moderate to moderately advanced patchy and confluent T2 FLAIR hyperintense signal abnormality within the cerebral white matter, nonspecific but compatible chronic small vessel ischemic disease. Chronic small vessel ischemic changes are also present within the bilateral deep gray nuclei. Additionally, mild chronic small vessel ischemic changes are present within the pons. There is mild chronic hemosiderin deposition along the right parietooccipital lobes, suggesting remote subarachnoid hemorrhage at this site. Additionally, there are clustered chronic parenchymal microhemorrhages within the right parietal and occipital lobes. There are a few additional punctate chronic microhemorrhages elsewhere within the supratentorial and infratentorial brain. No evidence of an intracranial mass. No extra-axial fluid collection. No midline shift. Vascular: Maintained flow voids within the proximal large arterial vessels. Skull and upper cervical spine: No focal suspicious marrow lesion. Mild C3-C4 grade 1 retrolisthesis. Incompletely assessed cervical spondylosis. Sinuses/Orbits: Visualized orbits show no acute finding. Bilateral ocular lens replacements. 18 mm mucous retention cyst within the right maxillary sinus. Trace mucosal thickening within the bilateral ethmoid sinuses. IMPRESSION: Small patchy acute cortical/subcortical infarcts within the left parietal lobe. Background moderate to moderately advanced  chronic small vessel ischemic changes within the cerebral white matter. To a lesser degree, chronic small vessel ischemic changes are also present within the bilateral deep gray nuclei and within the pons. Mild chronic hemosiderin deposition along the right parietooccipital lobes, suggesting remote subarachnoid hemorrhage at this site. Additionally, clustered chronic parenchymal microhemorrhages are present within the right parietooccipital lobes. Findings are nonspecific, but may reflect manifestations of cerebral amyloid angiopathy. Mild generalized parenchymal atrophy. Paranasal sinus disease, as described. Electronically Signed   By: Kellie Simmering D.O.   On: 04/23/2021 19:38   ECHOCARDIOGRAM COMPLETE  Result Date: 04/24/2021    ECHOCARDIOGRAM REPORT   Patient Name:   Rita Yoder Date of Exam: 04/24/2021 Medical Rec #:  [id number]          Height:       61.0 in Accession #:    [id number]         Weight:       214.3 lb Date of Birth:  Jul 09, 1940          BSA:          1.945 m Patient Age:    72 years           BP:           161/110 mmHg Patient Gender: F                  HR:           118 bpm. Exam Location:  Inpatient Procedure: 2D Echo Indications:    Chest pain  History:        Patient has no prior  history of Echocardiogram examinations.                 Risk Factors:Hypertension.  Sonographer:    Arlyss Gandy Referring Phys: [id number] [place] Comments: Image acquisition challenging due to patient body habitus. IMPRESSIONS  1. Left ventricular ejection fraction, by estimation, is 60 to 65%. The left ventricle has normal function. The left ventricle has no regional wall motion abnormalities. There is mild left ventricular hypertrophy. Left ventricular diastolic parameters are consistent with Grade I diastolic dysfunction (impaired relaxation).  2. Right ventricular systolic function is normal. The right ventricular size is normal. Tricuspid regurgitation signal is inadequate for assessing  PA pressure.  3. Left atrial size was mild to moderately dilated.  4. The mitral valve is normal in structure. No evidence of mitral valve regurgitation. No evidence of mitral stenosis.  5. The aortic valve is tricuspid. Aortic valve regurgitation is not visualized. Aortic valve sclerosis/calcification is present, without any evidence of aortic stenosis.  6. The inferior vena cava is normal in size with greater than 50% respiratory variability, suggesting right atrial pressure of 3 mmHg. FINDINGS  Left Ventricle: Left ventricular ejection fraction, by estimation, is 60 to 65%. The left ventricle has normal function. The left ventricle has no regional wall motion abnormalities. The left ventricular internal cavity size was normal in size. There is  mild left ventricular hypertrophy. Left ventricular diastolic parameters are consistent with Grade I diastolic dysfunction (impaired relaxation). Right Ventricle: The right ventricular size is normal. No increase in right ventricular wall thickness. Right ventricular systolic function is normal. Tricuspid regurgitation signal is inadequate for assessing PA pressure. Left Atrium: Left atrial size was mild to moderately dilated. Right Atrium: Right atrial size was normal in size. Pericardium: There is no evidence of pericardial effusion. Mitral Valve: The mitral valve is normal in structure. Mild mitral annular calcification. No evidence of mitral valve regurgitation. No evidence of mitral valve stenosis. Tricuspid Valve: The tricuspid valve is normal in structure. Tricuspid valve regurgitation is not demonstrated. Aortic Valve: The aortic valve is tricuspid. Aortic valve regurgitation is not visualized. Aortic valve sclerosis/calcification is present, without any evidence of aortic stenosis. Aortic valve mean gradient measures 5.0 mmHg. Aortic valve peak gradient measures 10.2 mmHg. Aortic valve area, by VTI measures 2.70 cm. Pulmonic Valve: The pulmonic valve was normal  in structure. Pulmonic valve regurgitation is not visualized. Aorta: The aortic root is normal in size and structure. Venous: The inferior vena cava is normal in size with greater than 50% respiratory variability, suggesting right atrial pressure of 3 mmHg. IAS/Shunts: No atrial level shunt detected by color flow Doppler.  LEFT VENTRICLE PLAX 2D LVIDd:         4.36 cm   Diastology LVIDs:         2.80 cm   LV e' medial:    4.35 cm/s LV PW:         1.27 cm   LV E/e' medial:  17.1 LV IVS:        1.31 cm   LV e' lateral:   4.68 cm/s LVOT diam:     2.00 cm   LV E/e' lateral: 15.9 LV SV:         100 LV SV Index:   52 LVOT Area:     3.14 cm  RIGHT VENTRICLE RV Basal diam:  3.70 cm RV Mid diam:    2.95 cm RV S prime:     10.30 cm/s TAPSE (M-mode):  2.2 cm LEFT ATRIUM             Index        RIGHT ATRIUM           Index LA diam:        3.70 cm 1.90 cm/m   RA Area:     17.50 cm 9.00 cm/m LA Vol (A2C):   53.2 ml 27.35 ml/m LA Vol (A4C):   78.0 ml 40.10 ml/m LA Biplane Vol: 67.8 ml 34.86 ml/m  AORTIC VALVE AV Area (Vmax):    2.30 cm AV Area (Vmean):   2.36 cm AV Area (VTI):     2.70 cm AV Vmax:           160.00 cm/s AV Vmean:          107.000 cm/s AV VTI:            0.371 m AV Peak Grad:      10.2 mmHg AV Mean Grad:      5.0 mmHg LVOT Vmax:         117.00 cm/s LVOT Vmean:        80.400 cm/s LVOT VTI:          0.319 m LVOT/AV VTI ratio: 0.86  AORTA Ao Root diam: 2.80 cm Ao Asc diam:  3.10 cm MITRAL VALVE MV Area (PHT): 1.94 cm    SHUNTS MV Decel Time: 391 msec    Systemic VTI:  0.32 m MV E velocity: 74.60 cm/s  Systemic Diam: 2.00 cm MV A velocity: 81.00 cm/s MV E/A ratio:  0.92 Dalton McleanMD Electronically signed by Franki Monte Signature Date/Time: 04/24/2021/4:11:07 PM    Final    CT HEAD CODE STROKE WO CONTRAST  Result Date: 04/23/2021 CLINICAL DATA:  Code stroke. Acute neuro deficit. Left-sided gaze. Difficulty speaking. EXAM: CT HEAD WITHOUT CONTRAST TECHNIQUE: Contiguous axial images were obtained from  the base of the skull through the vertex without intravenous contrast. RADIATION DOSE REDUCTION: This exam was performed according to the departmental dose-optimization program which includes automated exposure control, adjustment of the mA and/or kV according to patient size and/or use of iterative reconstruction technique. COMPARISON:  None. FINDINGS: Brain: Mild atrophy. Moderate white matter hypodensity bilaterally. This is symmetric and most likely chronic microvascular ischemia. Negative for acute hemorrhage or mass. 8 mm hypodensity left thalamus compatible with infarct of indeterminate age. Vascular: Negative for hyperdense vessel Skull: Negative Sinuses/Orbits: Retention cyst right maxillary sinus. Remaining sinuses clear. Bilateral cataract extraction Other: None ASPECTS ([place] Stroke Program Early CT Score) - Ganglionic level infarction (caudate, lentiform nuclei, internal capsule, insula, M1-M3 cortex): 7 - Supraganglionic infarction (M4-M6 cortex): 3 Total score (0-10 with 10 being normal): 10 IMPRESSION: 1. 8 mm hypodensity left thalamus compatible with infarct of indeterminate age. Possibly acute. No acute hemorrhage. 2. ASPECTS is 10 3. Atrophy and moderate chronic microvascular ischemic change. 4. Code stroke imaging results were communicated on 04/23/2021 at 4:39 pm to provider Palikh via text page Electronically Signed   By: Franchot Gallo M.D.   On: 04/23/2021 16:41     Family Communication: At bedside  Disposition: Status is: Inpatient Remains inpatient appropriate because: Stroke work up, CIR disposition sought Planned Discharge Destination: Rehab      Patrecia Pour, MD 04/24/2021 4:16 PM Page by [email or web address]

## 2021-04-24 NOTE — Progress Notes (Signed)
Inpatient Rehab Admissions Coordinator Note:  Per therapy recommendations, pt was screened for candidacy for CIR by Michel Santee, PT.  At this time, pt does not appear to be able to tolerate the intensity of our CIR program at this time.  No consult recommended, however CIR will follow from a distance and if pt demonstrates improved tolerance we will request/place an order at that time per our protocol.    Shann Medal, PT, DPT [phone] 04/24/21  4:42 PM

## 2021-04-24 NOTE — ED Notes (Signed)
Dr. Milas Gain noted twitching to right hand which began during his assessment. He suspects possible seizure activity.

## 2021-04-24 NOTE — ED Notes (Signed)
Rita Yoder niece [phone] requesting an update

## 2021-04-24 NOTE — ED Notes (Signed)
OJ given for CBG 74

## 2021-04-24 NOTE — ED Notes (Signed)
PT demonstrating stroke like symptoms again.  Neurology notified. Dr. Milas Gain at bedside.

## 2021-04-24 NOTE — Progress Notes (Signed)
Physical Therapy Evaluation Patient Details Name: Rita Yoder MRN: [id number] DOB: September 01, 1940 Today's Date: 04/24/2021  History of Present Illness  81 y/o female presenting on 2/14 with R sided numbness. MRI with small patchy acute cortical/subcortical infarcts in L parietal lobe. Noted worsening dysarthria and twitching of R hand 2/15- plan for EEG.  PMH includes: CKD, HTN.  Clinical Impression  Pt was seen for mobility today, but is voicing being fearful of falling with any attempts to stand.  Her granddaughter is in attendance to be supportive and encourage pt.  Did orthostatics to be sure pt was not having dizzy complaints over this. Supine:  156/80 Sitting:  158/75, sat 100%   Recommending AIR care due to her findings of stroke along with recent higher level of function and family support. Pt is recently ambulatory and should be able to make timely progress with her family voicing support as well.  Follow acutely to progress her to more standing and increase balance along with gait control.  Focus on R hip strength and encouraging OOB to chair for endurance.     Recommendations for follow up therapy are one component of a multi-disciplinary discharge planning process, led by the attending physician.  Recommendations may be updated based on patient status, additional functional criteria and insurance authorization.  Follow Up Recommendations Acute inpatient rehab (3hours/day)    Assistance Recommended at Discharge Frequent or constant Supervision/Assistance  Patient can return home with the following  Two people to help with walking and/or transfers;A lot of help with bathing/dressing/bathroom;Assistance with cooking/housework;Assist for transportation;Help with stairs or ramp for entrance    Equipment Recommendations Rolling walker (2 wheels);Wheelchair (measurements PT);Wheelchair cushion (measurements PT) (if pt does not have these things)  Recommendations for Other Services   Rehab consult    Functional Status Assessment Patient has had a recent decline in their functional status and demonstrates the ability to make significant improvements in function in a reasonable and predictable amount of time.     Precautions / Restrictions Precautions Precautions: Fall Precaution Comments: pt is fearful to stand Restrictions Weight Bearing Restrictions: No Other Position/Activity Restrictions: L hip pain from OA      Mobility  Bed Mobility Overal bed mobility: Needs Assistance Bed Mobility: Supine to Sit, Sit to Supine     Supine to sit: Mod assist Sit to supine: Mod assist   General bed mobility comments: max assist of two to scoot up in bed    Transfers Overall transfer level: Needs assistance                 General transfer comment: pt was sitting with feet supported on foot stool, declined to be assisted to try to stand, fearful    Ambulation/Gait               General Gait Details: pt declined to try  Stairs            Wheelchair Mobility    Modified Rankin (Stroke Patients Only)       Balance Overall balance assessment: Needs assistance Sitting-balance support: Feet supported, Single extremity supported Sitting balance-Leahy Scale: Fair         Standing balance comment: declined to try                             Pertinent Vitals/Pain Pain Assessment Pain Assessment: Faces Faces Pain Scale: Hurts little more Pain Location: L hip OA Pain Descriptors / Indicators:  Guarding Pain Intervention(s): Limited activity within patient's tolerance, Monitored during session, Repositioned    Home Living Family/patient expects to be discharged to:: Skilled nursing facility Living Arrangements: Other relatives Available Help at Discharge: Family;Available 24 hours/day Type of Home: House Home Access: Level entry       Home Layout: One level Home Equipment: Cane - single Barista (2  wheels) Additional Comments: has been walking unassisted at times and at times with AD    Prior Function Prior Level of Function : Needs assist       Physical Assist : Mobility (physical) Mobility (physical): Gait   Mobility Comments: using a SPC or RW depending on her L hip pain       Hand Dominance   Dominant Hand: Right    Extremity/Trunk Assessment   Upper Extremity Assessment Upper Extremity Assessment: Generalized weakness    Lower Extremity Assessment Lower Extremity Assessment: Generalized weakness (4 to 4+ on BLE's)    Cervical / Trunk Assessment Cervical / Trunk Assessment: Kyphotic (mild)  Communication   Communication: No difficulties  Cognition Arousal/Alertness: Awake/alert, Lethargic Behavior During Therapy: Flat affect Overall Cognitive Status: Impaired/Different from baseline Area of Impairment: Problem solving, Awareness, Following commands, Attention, Orientation                 Orientation Level: Situation, Time Current Attention Level: Selective   Following Commands: Follows one step commands inconsistently, Follows one step commands with increased time   Awareness: Intellectual Problem Solving: Slow processing, Requires verbal cues, Requires tactile cues General Comments: requires extra time and        General Comments General comments (skin integrity, edema, etc.): pt was seen for progression of supine to sit but declines to stand.  Her efforts are good, making attempt to move and then deciding she is not comfortable trying.    Exercises     Assessment/Plan    PT Assessment Patient needs continued PT services  PT Problem List Decreased strength;Decreased range of motion;Decreased activity tolerance;Decreased balance;Decreased mobility;Decreased cognition;Decreased safety awareness       PT Treatment Interventions DME instruction;Gait training;Functional mobility training;Therapeutic activities;Therapeutic exercise;Balance  training;Neuromuscular re-education;Patient/family education    PT Goals (Current goals can be found in the Care Plan section)  Acute Rehab PT Goals Patient Stated Goal: none stated PT Goal Formulation: With patient Time For Goal Achievement: 05/08/21 Potential to Achieve Goals: Good    Frequency Min 4X/week     Co-evaluation               AM-PAC PT "6 Clicks" Mobility  Outcome Measure Help needed turning from your back to your side while in a flat bed without using bedrails?: A Lot Help needed moving from lying on your back to sitting on the side of a flat bed without using bedrails?: A Lot Help needed moving to and from a bed to a chair (including a wheelchair)?: Total Help needed standing up from a chair using your arms (e.g., wheelchair or bedside chair)?: Total Help needed to walk in hospital room?: Total Help needed climbing 3-5 steps with a railing? : Total 6 Click Score: 8    End of Session   Activity Tolerance: Patient limited by fatigue;Treatment limited secondary to medical complications (Comment) Patient left: in bed;with call bell/phone within reach;with family/visitor present Nurse Communication: Mobility status;Other (comment) (discharge planning) PT Visit Diagnosis: Muscle weakness (generalized) (M62.81);Difficulty in walking, not elsewhere classified (R26.2)    Time: 8850-2774 PT Time Calculation (min) (ACUTE ONLY): 26 min  Charges:   PT Evaluation $PT Eval Moderate Complexity: 1 Mod PT Treatments $Therapeutic Activity: 8-22 mins       Ramond Dial 04/24/2021, 2:59 PM  Mee Hives, PT PhD Acute Rehab Dept. Number: [place] and [place]

## 2021-04-24 NOTE — ED Notes (Signed)
Breakfast Orders Placed °

## 2021-04-25 ENCOUNTER — Encounter (HOSPITAL_COMMUNITY): Payer: Self-pay | Admitting: Internal Medicine

## 2021-04-25 ENCOUNTER — Other Ambulatory Visit: Payer: Self-pay

## 2021-04-25 LAB — GLUCOSE, CAPILLARY
Glucose-Capillary: 81 mg/dL (ref 70–99)
Glucose-Capillary: 131 mg/dL — ABNORMAL HIGH (ref 70–99)
Glucose-Capillary: 94 mg/dL (ref 70–99)
Glucose-Capillary: 76 mg/dL (ref 70–99)

## 2021-04-25 LAB — HIV ANTIBODY (ROUTINE TESTING W REFLEX): HIV Screen 4th Generation wRfx: NONREACTIVE

## 2021-04-25 LAB — RPR: RPR Ser Ql: NONREACTIVE

## 2021-04-25 NOTE — Evaluation (Signed)
Occupational Therapy Evaluation Patient Details Name: Rita Yoder MRN: [id number] DOB: June 29, 1940 Today's Date: 04/25/2021   History of Present Illness 81 y/o female presenting on 2/14 with R sided numbness. MRI with small patchy acute cortical/subcortical infarcts in L parietal lobe. Noted worsening dysarthria and twitching of R hand 2/15- plan for EEG.  PMH includes: CKD, HTN.   Clinical Impression   Pt admitted for concerns listed above. PTA pt's family reported that she was overall independent with all ADL's and functional mobility. At this time, pt is limited by R inattention, weakness, balance deficits, cognitive deficits. With assist however this session, pt stood in the stedy for 10 mins to get cleaned up and follows 75% of simple commands. Recommending AIR as pt has good motivation, support at home, and potential to make progress. OT will follow acutely.      Recommendations for follow up therapy are one component of a multi-disciplinary discharge planning process, led by the attending physician.  Recommendations may be updated based on patient status, additional functional criteria and insurance authorization.   Follow Up Recommendations  Acute inpatient rehab (3hours/day)    Assistance Recommended at Discharge Frequent or constant Supervision/Assistance  Patient can return home with the following Two people to help with walking and/or transfers;Two people to help with bathing/dressing/bathroom;Assistance with cooking/housework;Assistance with feeding;Direct supervision/assist for medications management;Direct supervision/assist for financial management;Help with stairs or ramp for entrance    Functional Status Assessment  Patient has had a recent decline in their functional status and demonstrates the ability to make significant improvements in function in a reasonable and predictable amount of time.  Equipment Recommendations  Other (comment) (TBD)    Recommendations for  Other Services Rehab consult     Precautions / Restrictions Precautions Precautions: Fall Restrictions Weight Bearing Restrictions: No Other Position/Activity Restrictions: L hip pain from OA      Mobility Bed Mobility Overal bed mobility: Needs Assistance Bed Mobility: Supine to Sit, Sit to Supine     Supine to sit: Min assist, HOB elevated, Mod assist     General bed mobility comments: Min assist to come to sitting with pt initiiating long sitting well.  Did need assist to scoot hips to EOB with use of pad.    Transfers Overall transfer level: Needs assistance   Transfers: Sit to/from Stand, Bed to chair/wheelchair/BSC Sit to Stand: Mod assist, +2 physical assistance, From elevated surface           General transfer comment: Pt able to stand to [facility] with mod assist to stand initially but once up pt was able to stand with min guard to min assist and cues to maintain hip extension as well as assist to keep right UE in place on Stedy to be cleaned for up to 8 min as she had BM and urine.  Stood from the [employer] min assist after moved pt to recliner.  Mod assist for stand to sit fromSTedy with assist for right UE. Transfer via Lift Equipment: Stedy    Balance Overall balance assessment: Needs assistance Sitting-balance support: Feet supported, Single extremity supported Sitting balance-Leahy Scale: Fair Sitting balance - Comments: can sit for extended period of time at EOB.  NEeds assist to scoot right hip but can scoot left hip on her own with guard assist and cues as she wants to keep scooting the left side too far. .   Standing balance support: Bilateral upper extremity supported, During functional activity, Reliant on assistive device for balance Standing balance-Leahy Scale:  Poor Standing balance comment: Relies on Support of Stedy and external support to stand                           ADL either performed or assessed with clinical judgement   ADL  Overall ADL's : Needs assistance/impaired                                       General ADL Comments: Pt requiring max-total A for all ADL's at this time due to L neglect, vision deficits, balance deficits, weakness, and cognitive deficits.     Vision Baseline Vision/History: 1 Wears glasses Ability to See in Adequate Light: 0 Adequate Patient Visual Report: Peripheral vision impairment Vision Assessment?: Yes Eye Alignment: Within Functional Limits Ocular Range of Motion: Restricted on the left;Restricted looking up;Restricted looking down Alignment/Gaze Preference: Gaze right Tracking/Visual Pursuits: Left eye does not track laterally;Left eye does not track medially;Requires cues, head turns, or add eye shifts to track Saccades: Impaired - to be further tested in functional context Visual Fields: Left visual field deficit;Impaired-to be further tested in functional context Depth Perception: Overshoots Additional Comments: Pt with L inattention     Perception     Praxis      Pertinent Vitals/Pain Pain Assessment Pain Assessment: No/denies pain     Hand Dominance Right   Extremity/Trunk Assessment Upper Extremity Assessment Upper Extremity Assessment: LUE deficits/detail LUE Deficits / Details: Pt not moving LUE on command, however seen completing elbow flexion, small shoulder flexion, and abduction movements. LUE Sensation: decreased proprioception;decreased light touch LUE Coordination: decreased fine motor;decreased gross motor   Lower Extremity Assessment Lower Extremity Assessment: Defer to PT evaluation   Cervical / Trunk Assessment Cervical / Trunk Assessment: Kyphotic   Communication Communication Communication: No difficulties   Cognition Arousal/Alertness: Awake/alert Behavior During Therapy: Flat affect Overall Cognitive Status: Difficult to assess (due to aphasia) Area of Impairment: Problem solving, Awareness, Following commands,  Attention, Orientation                 Orientation Level: Situation, Time Current Attention Level: Selective   Following Commands: Follows one step commands inconsistently, Follows one step commands with increased time   Awareness: Intellectual Problem Solving: Slow processing, Requires verbal cues, Requires tactile cues General Comments: Pt with visual impairment with double vision. Pt with inattention to left hemibody with difficulty getting pt to turn head or eyes past midline.  Pt perseverating on using bathroom.  Of note, pt with large BM and purewick leaked once she was in standing.     General Comments  VSS on RA    Exercises     Shoulder Instructions      Home Living Family/patient expects to be discharged to:: Private residence Living Arrangements: Children Available Help at Discharge: Family;Available 24 hours/day Type of Home: House Home Access: Level entry     Home Layout: One level     Bathroom Shower/Tub: Teacher, early years/pre: Standard     Home Equipment: Cane - single Barista (2 wheels)   Additional Comments: has been walking unassisted at times and at times with AD      Prior Functioning/Environment Prior Level of Function : Needs assist             Mobility Comments: using a SPC or RW depending on her L hip pain ADLs  Comments: indep        OT Problem List: Decreased strength;Decreased range of motion;Decreased activity tolerance;Impaired balance (sitting and/or standing);Impaired vision/perception;Decreased coordination;Decreased cognition;Decreased safety awareness;Decreased knowledge of use of DME or AE;Impaired sensation;Obesity;Impaired UE functional use      OT Treatment/Interventions: Self-care/ADL training;Therapeutic exercise;Energy conservation;DME and/or AE instruction;Therapeutic activities;Cognitive remediation/compensation;Visual/perceptual remediation/compensation;Patient/family education;Balance  training    OT Goals(Current goals can be found in the care plan section) Acute Rehab OT Goals Patient Stated Goal: None stated OT Goal Formulation: With patient/family Time For Goal Achievement: 05/09/21 Potential to Achieve Goals: Good ADL Goals Pt Will Perform Grooming: with mod assist;sitting Pt Will Perform Upper Body Bathing: with mod assist;sitting Pt Will Perform Lower Body Bathing: with max assist;sitting/lateral leans;sit to/from stand Pt Will Perform Upper Body Dressing: with mod assist;sitting Pt Will Perform Lower Body Dressing: with max assist;sitting/lateral leans;sit to/from stand Pt Will Transfer to Toilet: with max assist;stand pivot transfer Pt Will Perform Toileting - Clothing Manipulation and hygiene: with max assist;sitting/lateral leans;sit to/from stand  OT Frequency: Min 2X/week    Co-evaluation PT/OT/SLP Co-Evaluation/Treatment: Yes Reason for Co-Treatment: Complexity of the patient's impairments (multi-system involvement);For patient/therapist safety   OT goals addressed during session: Strengthening/ROM;ADL's and self-care      AM-PAC OT "6 Clicks" Daily Activity     Outcome Measure Help from another person eating meals?: A Lot Help from another person taking care of personal grooming?: A Lot Help from another person toileting, which includes using toliet, bedpan, or urinal?: A Lot Help from another person bathing (including washing, rinsing, drying)?: A Lot Help from another person to put on and taking off regular upper body clothing?: A Lot Help from another person to put on and taking off regular lower body clothing?: A Lot 6 Click Score: 12   End of Session Equipment Utilized During Treatment: Gait belt;Rolling walker (2 wheels) Nurse Communication: Mobility status;Need for lift equipment  Activity Tolerance: Patient tolerated treatment well Patient left: in chair;with call bell/phone within reach;with chair alarm set  OT Visit Diagnosis:  Unsteadiness on feet (R26.81);Other abnormalities of gait and mobility (R26.89);Muscle weakness (generalized) (M62.81);Low vision, both eyes (H54.2);Hemiplegia and hemiparesis Hemiplegia - Right/Left: Left Hemiplegia - dominant/non-dominant: Non-Dominant Hemiplegia - caused by: Cerebral infarction                Time: 1040-1120 OT Time Calculation (min): 40 min Charges:  OT General Charges $OT Visit: 1 Visit OT Evaluation $OT Eval Moderate Complexity: 1 Mod OT Treatments $Self Care/Home Management : 8-22 mins  Cai Anfinson H., OTR/L Acute Rehabilitation  Brenisha Tsui Elane Yolanda Bonine 04/25/2021, 6:36 PM

## 2021-04-25 NOTE — Plan of Care (Signed)
°  Problem: Education: Goal: Knowledge of disease or condition will improve Outcome: Progressing Goal: Knowledge of secondary prevention will improve (SELECT ALL) Outcome: Progressing Goal: Knowledge of patient specific risk factors will improve (INDIVIDUALIZE FOR PATIENT) Outcome: Progressing   Problem: Ischemic Stroke/TIA Tissue Perfusion: Goal: Complications of ischemic stroke/TIA will be minimized Outcome: Progressing   

## 2021-04-25 NOTE — TOC Initial Note (Signed)
Transition of Care [facility]) - Initial/Assessment Note    Patient Details  Name: Rita Yoder MRN: [id number] Date of Birth: 30-Nov-1940  [facility]) CM/SW Contact:    Coralee Pesa, [place] Phone Number: 04/25/2021, 12:20 PM  Clinical Narrative:                 CSW noted that pt is disoriented at this time and spoke with Dtr [name] about PT recommendations. She was advised that CIR did not think she would be a good candidate at this time, and they would need to consider SNF vs Home. [name] stated that pt had been very independent PTA, and she would like to get her back home if possible. She requested her brother, Izell [place], be called to discuss disposition. CSW spoke with Izell [place] and SNF vs HH was discussed. He stated he would need to discuss with the family to determine what level of care they can provide. Pt has a walker at home and they may have a wheelchair. Pt has nurses come out from her MD ofice, but does not actively have [place]. Pt has not been to SNF, as far as the son is aware. They will discuss options and get back to CSW with choice. TOC will continue to follow for DC needs.  Expected Discharge Plan: [place] Barriers to Discharge: Continued Medical Work up, Barriers Unresolved (comment) (Family deciding on disposition)   Patient Goals and CMS Choice Patient states their goals for this hospitalization and ongoing recovery are:: Pt unable to participate in goal setting due to disorientation. CMS Medicare.gov Compare Post Acute Care list provided to:: Patient Represenative (must comment) (Children) Choice offered to / list presented to : Adult Children  Expected Discharge Plan and Services Expected Discharge Plan: [place] Choice: [place] arrangements for the past 2 months: Single Family Home                                      Prior Living Arrangements/Services Living arrangements for the  past 2 months: Single Family Home Lives with:: Adult Children Patient language and need for interpreter reviewed:: Yes Do you feel safe going back to the place where you live?: Yes      Need for Family Participation in Patient Care: Yes (Comment) Care giver support system in place?: Yes (comment)   Criminal Activity/Legal Involvement Pertinent to Current Situation/Hospitalization: No - Comment as needed  Activities of Daily Living Home Assistive Devices/Equipment: None ADL Screening (condition at time of admission) Patient's cognitive ability adequate to safely complete daily activities?: Yes Is the patient deaf or have difficulty hearing?: No Does the patient have difficulty seeing, even when wearing glasses/contacts?: No Does the patient have difficulty concentrating, remembering, or making decisions?: No Patient able to express need for assistance with ADLs?: Yes Does the patient have difficulty dressing or bathing?: Yes Independently performs ADLs?: No Communication: Needs assistance Is this a change from baseline?: Change from baseline, expected to last <3 days Dressing (OT): Needs assistance Is this a change from baseline?: Change from baseline, expected to last <3days Grooming: Needs assistance Is this a change from baseline?: Change from baseline, expected to last <3 days Feeding: Independent Bathing: Needs assistance Is this a change from baseline?: Change from baseline, expected to last <3 days Toileting: Needs assistance Is this a change  from baseline?: Change from baseline, expected to last <3 days Does the patient have difficulty walking or climbing stairs?: Yes Weakness of Legs: None Weakness of Arms/Hands: Right  Permission Sought/Granted Permission sought to share information with : Family Supports Permission granted to share information with : Yes, Verbal Permission Granted  Share Information with NAME: Gatha Mayer     Permission granted to share info w  Relationship: Daughter  Permission granted to share info w Contact Information: [phone]  Emotional Assessment Appearance:: Appears stated age Attitude/Demeanor/Rapport: Unable to Assess Affect (typically observed): Unable to Assess Orientation: : Oriented to Self, Oriented to Place Alcohol / Substance Use: Not Applicable Psych Involvement: No (comment)  Admission diagnosis:  TIA (transient ischemic attack) [G45.9] Stroke [facility]) [I63.9] Stroke (cerebrum) ([place]) [I63.9] Patient Active Problem List   Diagnosis Date Noted   Stroke ([place]) 04/24/2021   Stroke (cerebrum) ([place]) 04/23/2021   Chronic kidney disease, stage IV (severe) ([place]) 08/26/2020   Weight loss 08/26/2020   Abnormal thyroid blood test 08/26/2020   Left hip pain 08/26/2020   Atherosclerosis of aorta ([place]) 08/20/2020   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   Ecchymoses, spontaneous 06/14/2019   Class 3 severe obesity due to excess calories with serious comorbidity and body mass index (BMI) of 45.0 to 49.9 in adult ([place]) 06/14/2019   Diabetic polyneuropathy associated with type 2 diabetes mellitus ([place]) 01/06/2019   BMI 40.0-44.9, adult ([place]) 01/06/2019   Essential hypertension 11/25/2018   Mixed hyperlipidemia 11/25/2018   Precordial pain 11/25/2018   CKD (chronic kidney disease), symptom management only, stage 3 (moderate) ([place]) 04/20/2013   PCP:  [place] Chard, MD Pharmacy:   [facility] [place] [place] [address] Phone: [phone] Fax: [phone]     Social Determinants of Health (SDOH) Interventions    Readmission Risk Interventions No flowsheet data found.

## 2021-04-25 NOTE — Progress Notes (Signed)
Speech Language Pathology Treatment: Cognitive-Linquistic (Aphasia/dysarthria)  Patient Details Name: Rita Yoder MRN: [id number] DOB: 12-10-1940 Today's Date: 04/25/2021 Time: 0946-1000 SLP Time Calculation (min) (ACUTE ONLY): 14 min  Assessment / Plan / Recommendation Clinical Impression  Pt was seen for treatment with her daughter present. Pt was cooperative during the session. Pt and her daughter were educated regarding the nature of dysarthria, and compensatory strategies to improve speech intelligibility. Both parties verbalized understanding, but the extent of pt's comprehension is questioned. Pt's daughter was educated regarding strategies to improve auditory comprehension, facilitate communication with the pt, and to reduce the pt's frustration. She verbalized understanding as well as agreement and was noted to use some of the strategies during the session. Aphasia and dysarthria educational literature were given. Pt demonstrated use of increased vocal intensity at the single-word level with 70% accuracy given intermittent cues and models. SLP will continue to follow pt.     HPI HPI: Pt is an 81 y.o. female who presented with new onset of right arm and hand numbness tingling. MRI brain: Small patchy acute cortical/subcortical infarcts within the left parietal lobe. Worsening dysarthria and rhythmic R hand twitching that she was unable to volitionally stop 2/15; focal seizure questioned by neurology. EEG could not be completed. PMH: HTN, IIDM, HLD, stage IIIb, normocytic anemia.      SLP Plan  Continue with current plan of care  Patient needs continued Speech Lanaguage Pathology Services   Recommendations for follow up therapy are one component of a multi-disciplinary discharge planning process, led by the attending physician.  Recommendations may be updated based on patient status, additional functional criteria and insurance authorization.    Recommendations                    Follow Up Recommendations: Acute inpatient rehab (3hours/day) Assistance recommended at discharge: Frequent or constant Supervision/Assistance SLP Visit Diagnosis: Aphasia (R47.01);Dysarthria and anarthria (R47.1) Plan: Continue with current plan of care         Tobiah Celestine I. Hardin Negus, [place], [place] Office number [phone] Pager [place]  04/25/2021, 10:33 AM

## 2021-04-25 NOTE — Progress Notes (Signed)
Inpatient Rehab Admissions Coordinator:   Note pt progressing well.  Will place a CIR consult for full assessment by one of our ACs.   Shann Medal, PT, DPT Admissions Coordinator [phone] 04/25/21  3:49 PM

## 2021-04-25 NOTE — Progress Notes (Signed)
Physical Therapy Treatment Patient Details Name: Rita Yoder MRN: [id number] DOB: [birth year] Today's Date: 04/25/2021   History of Present Illness 81 y/o female presenting on 2/14 with R sided numbness. MRI with small patchy acute cortical/subcortical infarcts in L parietal lobe. Noted worsening dysarthria and twitching of R hand 2/15- plan for EEG.  PMH includes: CKD, HTN.    PT Comments    Pt admitted with above diagnosis. Pt making good progress today able to stand with Stedy for up to 10 min in standing.  Pt with inattention to right hemibody but can attend with cues and assist. Pt does have active movement of right hemibody.  Daughter present and she and son can care for pt at home x 24 hour care.  They have handicapped accessible home as well and understand pt may go home at wheelchair level and they can accomodate this.  Pt and daughter are very hopeful for AIR.  PT agrees. Pt currently with functional limitations due to balance and endurance deficits. Pt will benefit from skilled PT to increase their independence and safety with mobility to allow discharge to the venue listed below.      Recommendations for follow up therapy are one component of a multi-disciplinary discharge planning process, led by the attending physician.  Recommendations may be updated based on patient status, additional functional criteria and insurance authorization.  Follow Up Recommendations  Acute inpatient rehab (3hours/day)     Assistance Recommended at Discharge Frequent or constant Supervision/Assistance  Patient can return home with the following Two people to help with walking and/or transfers;A lot of help with bathing/dressing/bathroom;Assistance with cooking/housework;Assist for transportation;Help with stairs or ramp for entrance   Equipment Recommendations  Rolling walker (2 wheels)    Recommendations for Other Services Rehab consult     Precautions / Restrictions  Precautions Precautions: Fall Restrictions Weight Bearing Restrictions: No Other Position/Activity Restrictions: L hip pain from OA     Mobility  Bed Mobility Overal bed mobility: Needs Assistance Bed Mobility: Supine to Sit, Sit to Supine     Supine to sit: Min assist, HOB elevated, Mod assist     General bed mobility comments: Min assist to come to sitting with pt initiiating long sitting well.  Did need assist to scoot hips to EOB with use of pad.    Transfers Overall transfer level: Needs assistance   Transfers: Sit to/from Stand, Bed to chair/wheelchair/BSC Sit to Stand: Mod assist, +2 physical assistance, From elevated surface           General transfer comment: Pt able to stand to [facility] with mod assist to stand initially but once up pt was able to stand with min guard to min assist and cues to maintain hip extension as well as assist to keep right UE in place on Stedy to be cleaned for up to 8 min as she had BM and urine.  Stood from the [employer] min assist after moved pt to recliner.  Mod assist for stand to sit fromSTedy with assist for right UE. Transfer via Lift Equipment: Stedy  Ambulation/Gait               General Gait Details: pt declined to try   Stairs             Wheelchair Mobility    Modified Rankin (Stroke Patients Only)       Balance Overall balance assessment: Needs assistance Sitting-balance support: Feet supported, Single extremity supported Sitting balance-Leahy Scale: Fair Sitting balance -  Comments: can sit for extended period of time at EOB.  NEeds assist to scoot right hip but can scoot left hip on her own with guard assist and cues as she wants to keep scooting the left side too far. .   Standing balance support: Bilateral upper extremity supported, During functional activity, Reliant on assistive device for balance Standing balance-Leahy Scale: Poor Standing balance comment: Relies on Support of Stedy and external  support to stand                            Cognition Arousal/Alertness: Awake/alert Behavior During Therapy: Flat affect Overall Cognitive Status: Difficult to assess (due to aphasia) Area of Impairment: Problem solving, Awareness, Following commands, Attention, Orientation                 Orientation Level: Situation, Time Current Attention Level: Selective   Following Commands: Follows one step commands inconsistently, Follows one step commands with increased time   Awareness: Intellectual Problem Solving: Slow processing, Requires verbal cues, Requires tactile cues General Comments: Pt with visual impairment with double vision. Pt with inattention to left hemibody with difficulty getting pt to turn head or eyes past midline.  Pt perseverating on using bathroom.  Of note, pt with large BM and purewick leaked once she was in standing.        Exercises General Exercises - Lower Extremity Long Arc Quad: AROM, Both, 10 reps, Seated Heel Slides: AROM, Both, 10 reps, Supine    General Comments        Pertinent Vitals/Pain Pain Assessment Pain Assessment: No/denies pain    Home Living                          Prior Function            PT Goals (current goals can now be found in the care plan section) Acute Rehab PT Goals Patient Stated Goal: none stated Progress towards PT goals: Progressing toward goals    Frequency    Min 4X/week      PT Plan Current plan remains appropriate    Co-evaluation PT/OT/SLP Co-Evaluation/Treatment: Yes Reason for Co-Treatment: Complexity of the patient's impairments (multi-system involvement);For patient/therapist safety PT goals addressed during session: Mobility/safety with mobility        AM-PAC PT "6 Clicks" Mobility   Outcome Measure  Help needed turning from your back to your side while in a flat bed without using bedrails?: A Lot Help needed moving from lying on your back to sitting on the  side of a flat bed without using bedrails?: A Lot Help needed moving to and from a bed to a chair (including a wheelchair)?: Total Help needed standing up from a chair using your arms (e.g., wheelchair or bedside chair)?: Total Help needed to walk in hospital room?: Total Help needed climbing 3-5 steps with a railing? : Total 6 Click Score: 8    End of Session Equipment Utilized During Treatment: Gait belt Activity Tolerance: Patient limited by fatigue Patient left: with call bell/phone within reach;with family/visitor present;in chair;with chair alarm set Nurse Communication: Mobility status;Need for lift equipment (Stedy vs [place]ove) PT Visit Diagnosis: Muscle weakness (generalized) (M62.81);Difficulty in walking, not elsewhere classified (R26.2)     Time: 2841-3244 PT Time Calculation (min) (ACUTE ONLY): 45 min  Charges:  $Therapeutic Exercise: 8-22 mins $Therapeutic Activity: 8-22 mins  Alonzo Owczarzak M,PT Acute Rehab Services [phone] (pager)    Alvira Philips 04/25/2021, 1:28 PM

## 2021-04-25 NOTE — Evaluation (Signed)
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   Ecchymoses, spontaneous 06/14/2019   Class 3 severe obesity due to excess calories with serious comorbidity and body mass index (BMI) of 45.0 to 49.9 in adult ([place]) 06/14/2019   Diabetic polyneuropathy associated with type 2 diabetes mellitus ([place]) 01/06/2019   BMI 40.0-44.9, adult ([place]) 01/06/2019   Essential hypertension 11/25/2018   Mixed hyperlipidemia 11/25/2018   Precordial pain 11/25/2018   CKD (chronic kidney disease), symptom management only, stage 3 (moderate) ([place]) 04/20/2013   Past Medical History:  Past Medical History:  Diagnosis Date   Chronic kidney disease 12/2009   Diabetes mellitus 2010   Dyslipidemia    Hypertension    Monoclonal gammopathy 03/2010   Past Surgical History:  Past Surgical History:  Procedure Laterality Date   CATARACT EXTRACTION Bilateral 12/2020   CHOLECYSTECTOMY     HERNIA REPAIR     ROTATOR CUFF REPAIR  03/10/1992   THYROID SURGERY     TONSILLECTOMY     TOTAL ABDOMINAL HYSTERECTOMY     HPI:  Pt is an 81 y.o. female who presented with new onset of right arm and hand numbness tingling. MRI brain: Small patchy acute cortical/subcortical infarcts within the left parietal lobe. Worsening dysarthria and rhythmic R hand twitching that she was unable to volitionally stop 2/15; focal seizure questioned by neurology. EEG could not be completed. PMH: HTN, IIDM, HLD, stage IIIb,  normocytic anemia.   Assessment / Plan / Recommendation Clinical Impression  Pt was seen for speech-language evaluation with her daughter present. She reported that the pt lives with her and her brother. She denied the pt having any baseline deficits in speech, language or cognition. Pt presented with moderate dysarthria characterized by reduced vocal intensity and reduced articulatory precision. Aphasia was noted with impairments in receptive and expressive language. She was able to follow limited 1-step commands with verbal prompts and tactile cues, but she exhibited difficulty beyond this level. Verbalization was noted during structured naming tasks when support was provided and she independently responded to questions with vocalization or yes/no. However, verbal expression did not extend beyond single-word utterances. Perseveration was noted and paraphasic errors demonstrated, but further linguistic analysis was difficulty due to pt's dysarthria. Skilled SLP services are clinically indicated at this time.    SLP Assessment  SLP Recommendation/Assessment: Patient needs continued Speech Lanaguage Pathology Services SLP Visit Diagnosis: Aphasia (R47.01);Dysarthria and anarthria (R47.1)    Recommendations for follow up therapy are one component of a multi-disciplinary discharge planning process, led by the attending physician.  Recommendations may be updated based on patient status, additional functional criteria and insurance authorization.    Follow Up Recommendations  Acute inpatient rehab (3hours/day)    Assistance Recommended at Discharge  Frequent or constant Supervision/Assistance  Functional Status Assessment Patient has had a recent decline in their functional status and demonstrates the ability to make significant improvements in function in a reasonable and predictable amount of time.  Frequency and Duration min 2x/week  2 weeks  SLP Evaluation Cognition  Overall Cognitive  Status: Difficult to assess (due to aphasia) Arousal/Alertness: Awake/alert Orientation Level: Oriented to person;Oriented to place;Oriented to time;Oriented to situation Attention: Focused Focused Attention: Appears intact       Comprehension  Auditory Comprehension Overall Auditory Comprehension: Impaired Yes/No Questions: Impaired Basic Immediate Environment Questions:  (2/5) Complex Questions:  (2/5; perservation on no) Commands: Impaired Interfering Components: Visual impairments    Expression Expression Primary Mode of Expression: Verbal Verbal Expression Overall Verbal Expression: Impaired Initiation: Impaired Automatic Speech:  (Counting: 0/10) Repetition: Impaired Level of Impairment: Phrase level (0/4) Naming: Impairment Responsive:  (0/3) Confrontation:  (0/4) Convergent:  (Phrase completion: 1/5 with cues) Interfering Components: Speech intelligibility   Oral / Motor  Oral Motor/Sensory Function Overall Oral Motor/Sensory Function: Mild impairment Facial ROM: Reduced right;Suspected CN VII (facial) dysfunction Facial Symmetry: Abnormal symmetry right;Suspected CN VII (facial) dysfunction Facial Strength: Reduced right;Suspected CN VII (facial) dysfunction Motor Speech Overall Motor Speech: Impaired Respiration: Impaired Level of Impairment: Phrase Phonation: Low vocal intensity Resonance: Within functional limits Articulation: Impaired Level of Impairment: Sentence Intelligibility: Intelligibility reduced Word: 75-100% accurate Phrase: 75-100% accurate Sentence: 50-74% accurate Conversation: 50-74% accurate Motor Planning: Witnin functional limits Motor Speech Errors: Aware;Consistent Effective Techniques: Slow rate;Over-articulate           Nashon Erbes I. Hardin Negus, [place], [place] Office number [phone] Pager [place] 04/25/2021, 10:28 AM

## 2021-04-25 NOTE — Progress Notes (Signed)
Progress Note  Patient: Rita Yoder PYP:[id number] DOB: 1940/06/29  DOA: 04/23/2021  DOS: 04/25/2021    Brief hospital course: Rita Yoder is a 81 y.o. female with medical history significant of HTN, IIDM, HLD, stage IIIb, normocytic anemia, came with new onset of right arm and hand numbness tingling.  Symptoms started around 1 PM, she denies any weakness of any of the limbs.  According to daughter, patient was briefly not talking and gaze to the right side but resolved soon.  At that time patient arrived in the ED, most of her symptoms of right-sided numbness has resolved.  Denies any headache or blurry vision.  She used to take aspirin which was discontinued by PCP several years ago.  Daughter reported patient blood pressure has been fairly controlled at home SBP= 140s.   ED Course: Blood pressure elevated SBP>160.  Creatinine 3.1, bicarb 20, BUN 49, glucose 160 hemoglobin 9.7 compared to baseline 10.3.   CT had hypodensity of left thalamus suspicious for acute/subacute stroke. MRI revealed left sided patchy infarcts suggestive of embolic source, small vessel ischemic changes, and possible SAH along the right parieto-occipital lobe. Due to concern for microhemorrhages, DAPT not recommended, and the patient was started on aspirin alone. Due to right hand twitching and concern for focal seizure, keppra was loaded, with resolution of tremor. EEG not performed due to glued wig in place. due to her deficits, acute inpatient rehabilitation was recommended, though CIR recommends SNF placement due to pt's inability to tolerate intensity of CIR program. This is being pursued.    Assessment and Plan: Acute multifocal strokes including left ischemic thalamus stroke and left parietal cortical and subcortical infarcts with concomitant right-sided weakness.  - Could consider loop recorder for suspicion of cardioembolic mechanism, though there is concern for right parietal microhemorrhages suggestive of  cerebral amyloid angiopathy and previous SAH. Pt is a poor long term anticoagulation candidate per neurology, so will continue with aspirin alone. - Continue ASA - LDL 78, slightly above goal of 70, augmented atorvastatin 20mg  > 40mg . - Permissive HTN as below - Echo without IAS or CES. - PT recommends CIR at discharge. CIR recommends SNF. D/w TOC and pt/family who are amenable to SNF which is being pursued.   Suspicion for focal seizure:  - Continue keppra. Pt's wig is not able to be removed for EEG.    HTN:  - Permissive HTN x48 hours.    T2DM: HbA1c 4.5%.  - SSI stopped due to hypoglycemia.   CKD stage IIIb - Euvolemic, creatinine level stable.  No acute electrolyte or acid-base abnormalities.   Anemia of CKD: Stable - Outpatient follow-up with PCP and nephrology.  Mildly elevated TSH:  - Recheck after discharge.   Obesity: Estimated body mass index is 40.49 kg/m as calculated from the following:   Height as of 03/25/21: 5\' 1"  (1.549 m).   Weight as of 03/25/21: 97.2 kg.  Obesity: Estimated body mass index is 33.84 kg/m as calculated from the following:   Height as of this encounter: 5\' 6"  (1.676 m).   Weight as of this encounter: 95.1 kg.  Subjective: More alert today, pt's daughter says she's usually "go, go, go" at home and wanted to leave last night, required frequent redirection to stay in bed.   Objective: Vitals:   04/24/21 2300 04/25/21 0320 04/25/21 0733 04/25/21 1131  BP: (!) 162/70 (!) 177/65 (!) 147/59 (!) 144/73  Pulse: (!) 54 (!) 51 (!) 50 (!) 56  Resp: 20 20 16  16  Temp: 98.7 F (37.1 C) 98.7 F (37.1 C) 98.4 F (36.9 C) 98.5 F (36.9 C)  TempSrc: Oral Oral Oral Oral  SpO2: 100% 100% 100% 99%  Weight:      Height:       Gen: Elderly, obese female in no distress Pulm: Nonlabored breathing room air.  CV: Regular rate and rhythm. No murmur, rub, or gallop. No JVD, no pitting dependent edema. GI: Abdomen soft, non-tender, non-distended, with  normoactive bowel sounds.  Ext: Warm, no deformities Skin: No rashes, lesions or ulcers on visualized skin. Neuro: Alert, dysarthric, sparse speech, 2/5 strength RLE, RUE without movement on command. No tremor. Psych: Judgement and insight appear impaired due to poor cognitive capacity.    Data Personally reviewed: WBC 9.7k, hgb 9.7 > 9.9. Plt 176.  Creatinine 3.13 > 3.30.  LFTs wnl HDL 34, LDL 78 TSH 5.275.     MR ANGIO HEAD WO CONTRAST  Result Date: 04/23/2021 CLINICAL DATA:  Provided history: Stroke, follow-up. EXAM: MRA HEAD WITHOUT CONTRAST TECHNIQUE: Angiographic images of the Circle of Willis were acquired using MRA technique without intravenous contrast. COMPARISON:  No pertinent prior exam. FINDINGS: Anterior circulation: The intracranial internal carotid arteries are patent. The M1 middle cerebral arteries are patent. Severe stenosis within the mid-to-distal M1 left MCA. No M2 proximal branch occlusion is identified. Atherosclerotic irregularity of the M2 and more distal MCA vessels on the left. The anterior cerebral arteries are patent. No intracranial aneurysm is identified. Posterior circulation: The intracranial vertebral arteries are patent. The basilar artery is patent. The posterior cerebral arteries are patent. Atherosclerotic irregularity of both vessels. Most notably, there are multiple sites of moderate/severe stenosis within the P2 left PCA. Posterior communicating arteries are present bilaterally. Anatomic variants: None significant. IMPRESSION: No intracranial large vessel occlusion is identified. Intracranial atherosclerotic disease with multifocal stenoses, most notably as follows. Multiple sites of moderate/severe stenosis within the P2 left PCA. Severe stenosis within the mid-to-distal M1 left MCA. Electronically Signed   By: Kellie Simmering D.O.   On: 04/23/2021 19:18   MR ANGIO NECK WO CONTRAST  Result Date: 04/23/2021 CLINICAL DATA:  Provided history: Stroke, follow-up.  EXAM: MRA NECK WITHOUT CONTRAST TECHNIQUE: Angiographic images of the neck were acquired using MRA technique without intravenous contrast. Carotid stenosis measurements (when applicable) are obtained utilizing NASCET criteria, using the distal internal carotid diameter as the denominator. COMPARISON:  Noncontrast head CT performed earlier today 04/23/2021. Concurrently performed MRA of the head. FINDINGS: Examination limited by motion degradation and non-contrast technique. Common origin of the innominate and left common carotid arteries. Motion degradation and non-contrast technique limits evaluation of the proximal right common carotid artery. Within this limitation, the common carotid and internal carotid arteries appear patent within the neck without hemodynamically significant stenosis. Mild atherosclerotic plaque within the mid left CCA. Mild irregularity of the cervical internal carotid arteries, bilaterally, which may reflect atherosclerotic disease or sequela of fibromuscular dysplasia. Motion degradation and non-contrast technique limits evaluation of the origins of the vertebral arteries. Elsewhere, the dominant left vertebral artery is patent within the neck without appreciable stenosis. The right vertebral artery is developmentally diminutive, but appears patent within the neck. IMPRESSION: Examination limited by motion degradation and non-contrast technique. No appreciable hemodynamically significant stenosis within the common carotid or internal carotid arteries within the neck. Mild atherosclerotic plaque within the mid left common carotid artery. Mild irregularity of the mid cervical internal carotid arteries, bilaterally, which may reflect atherosclerotic disease or sequela of fibromuscular dysplasia.  Additionally, there is a partially retropharyngeal course of the cervical internal carotid arteries. Motion degradation and non-contrast technique limits evaluation of the vertebral artery origins.  Elsewhere, the dominant left vertebral artery is patent within the neck without appreciable stenosis. The right vertebral artery is developmentally diminutive, but appears patent within the neck. Electronically Signed   By: Kellie Simmering D.O.   On: 04/23/2021 19:27   MR BRAIN WO CONTRAST  Result Date: 04/23/2021 CLINICAL DATA:  Provided history: Stroke, follow-up. EXAM: MRI HEAD WITHOUT CONTRAST TECHNIQUE: Multiplanar, multiecho pulse sequences of the brain and surrounding structures were obtained without intravenous contrast. COMPARISON:  Noncontrast head CT performed earlier today 04/23/2021. Concurrently performed brain MRI and MRA head 04/23/2021. FINDINGS: Mild intermittent motion degradation. Brain: Mild generalized cerebral and cerebellar atrophy. Small patchy acute cortical/subcortical infarcts within the left parietal lobe. The largest infarct at this site is cortically-based, measuring 2.6 cm in greatest dimension (series 3, image 8). Background moderate to moderately advanced patchy and confluent T2 FLAIR hyperintense signal abnormality within the cerebral white matter, nonspecific but compatible chronic small vessel ischemic disease. Chronic small vessel ischemic changes are also present within the bilateral deep gray nuclei. Additionally, mild chronic small vessel ischemic changes are present within the pons. There is mild chronic hemosiderin deposition along the right parietooccipital lobes, suggesting remote subarachnoid hemorrhage at this site. Additionally, there are clustered chronic parenchymal microhemorrhages within the right parietal and occipital lobes. There are a few additional punctate chronic microhemorrhages elsewhere within the supratentorial and infratentorial brain. No evidence of an intracranial mass. No extra-axial fluid collection. No midline shift. Vascular: Maintained flow voids within the proximal large arterial vessels. Skull and upper cervical spine: No focal suspicious  marrow lesion. Mild C3-C4 grade 1 retrolisthesis. Incompletely assessed cervical spondylosis. Sinuses/Orbits: Visualized orbits show no acute finding. Bilateral ocular lens replacements. 18 mm mucous retention cyst within the right maxillary sinus. Trace mucosal thickening within the bilateral ethmoid sinuses. IMPRESSION: Small patchy acute cortical/subcortical infarcts within the left parietal lobe. Background moderate to moderately advanced chronic small vessel ischemic changes within the cerebral white matter. To a lesser degree, chronic small vessel ischemic changes are also present within the bilateral deep gray nuclei and within the pons. Mild chronic hemosiderin deposition along the right parietooccipital lobes, suggesting remote subarachnoid hemorrhage at this site. Additionally, clustered chronic parenchymal microhemorrhages are present within the right parietooccipital lobes. Findings are nonspecific, but may reflect manifestations of cerebral amyloid angiopathy. Mild generalized parenchymal atrophy. Paranasal sinus disease, as described. Electronically Signed   By: Kellie Simmering D.O.   On: 04/23/2021 19:38   ECHOCARDIOGRAM COMPLETE  Result Date: 04/24/2021    ECHOCARDIOGRAM REPORT   Patient Name:   GAILENE YOUKHANA Deen Date of Exam: 04/24/2021 Medical Rec #:  [id number]          Height:       61.0 in Accession #:    [id number]         Weight:       214.3 lb Date of Birth:  Nov 21, 1940          BSA:          1.945 m Patient Age:    37 years           BP:           161/110 mmHg Patient Gender: F                  HR:  118 bpm. Exam Location:  Inpatient Procedure: 2D Echo Indications:    Chest pain  History:        Patient has no prior history of Echocardiogram examinations.                 Risk Factors:Hypertension.  Sonographer:    Arlyss Gandy Referring Phys: [id number] [place] Comments: Image acquisition challenging due to patient body habitus. IMPRESSIONS  1. Left ventricular  ejection fraction, by estimation, is 60 to 65%. The left ventricle has normal function. The left ventricle has no regional wall motion abnormalities. There is mild left ventricular hypertrophy. Left ventricular diastolic parameters are consistent with Grade I diastolic dysfunction (impaired relaxation).  2. Right ventricular systolic function is normal. The right ventricular size is normal. Tricuspid regurgitation signal is inadequate for assessing PA pressure.  3. Left atrial size was mild to moderately dilated.  4. The mitral valve is normal in structure. No evidence of mitral valve regurgitation. No evidence of mitral stenosis.  5. The aortic valve is tricuspid. Aortic valve regurgitation is not visualized. Aortic valve sclerosis/calcification is present, without any evidence of aortic stenosis.  6. The inferior vena cava is normal in size with greater than 50% respiratory variability, suggesting right atrial pressure of 3 mmHg. FINDINGS  Left Ventricle: Left ventricular ejection fraction, by estimation, is 60 to 65%. The left ventricle has normal function. The left ventricle has no regional wall motion abnormalities. The left ventricular internal cavity size was normal in size. There is  mild left ventricular hypertrophy. Left ventricular diastolic parameters are consistent with Grade I diastolic dysfunction (impaired relaxation). Right Ventricle: The right ventricular size is normal. No increase in right ventricular wall thickness. Right ventricular systolic function is normal. Tricuspid regurgitation signal is inadequate for assessing PA pressure. Left Atrium: Left atrial size was mild to moderately dilated. Right Atrium: Right atrial size was normal in size. Pericardium: There is no evidence of pericardial effusion. Mitral Valve: The mitral valve is normal in structure. Mild mitral annular calcification. No evidence of mitral valve regurgitation. No evidence of mitral valve stenosis. Tricuspid Valve: The  tricuspid valve is normal in structure. Tricuspid valve regurgitation is not demonstrated. Aortic Valve: The aortic valve is tricuspid. Aortic valve regurgitation is not visualized. Aortic valve sclerosis/calcification is present, without any evidence of aortic stenosis. Aortic valve mean gradient measures 5.0 mmHg. Aortic valve peak gradient measures 10.2 mmHg. Aortic valve area, by VTI measures 2.70 cm. Pulmonic Valve: The pulmonic valve was normal in structure. Pulmonic valve regurgitation is not visualized. Aorta: The aortic root is normal in size and structure. Venous: The inferior vena cava is normal in size with greater than 50% respiratory variability, suggesting right atrial pressure of 3 mmHg. IAS/Shunts: No atrial level shunt detected by color flow Doppler.  LEFT VENTRICLE PLAX 2D LVIDd:         4.36 cm   Diastology LVIDs:         2.80 cm   LV e' medial:    4.35 cm/s LV PW:         1.27 cm   LV E/e' medial:  17.1 LV IVS:        1.31 cm   LV e' lateral:   4.68 cm/s LVOT diam:     2.00 cm   LV E/e' lateral: 15.9 LV SV:         100 LV SV Index:   52 LVOT Area:     3.14 cm  RIGHT VENTRICLE RV Basal diam:  3.70 cm RV Mid diam:    2.95 cm RV S prime:     10.30 cm/s TAPSE (M-mode): 2.2 cm LEFT ATRIUM             Index        RIGHT ATRIUM           Index LA diam:        3.70 cm 1.90 cm/m   RA Area:     17.50 cm 9.00 cm/m LA Vol (A2C):   53.2 ml 27.35 ml/m LA Vol (A4C):   78.0 ml 40.10 ml/m LA Biplane Vol: 67.8 ml 34.86 ml/m  AORTIC VALVE AV Area (Vmax):    2.30 cm AV Area (Vmean):   2.36 cm AV Area (VTI):     2.70 cm AV Vmax:           160.00 cm/s AV Vmean:          107.000 cm/s AV VTI:            0.371 m AV Peak Grad:      10.2 mmHg AV Mean Grad:      5.0 mmHg LVOT Vmax:         117.00 cm/s LVOT Vmean:        80.400 cm/s LVOT VTI:          0.319 m LVOT/AV VTI ratio: 0.86  AORTA Ao Root diam: 2.80 cm Ao Asc diam:  3.10 cm MITRAL VALVE MV Area (PHT): 1.94 cm    SHUNTS MV Decel Time: 391 msec     Systemic VTI:  0.32 m MV E velocity: 74.60 cm/s  Systemic Diam: 2.00 cm MV A velocity: 81.00 cm/s MV E/A ratio:  0.92 Dalton McleanMD Electronically signed by Franki Monte Signature Date/Time: 04/24/2021/4:11:07 PM    Final    CT HEAD CODE STROKE WO CONTRAST  Result Date: 04/23/2021 CLINICAL DATA:  Code stroke. Acute neuro deficit. Left-sided gaze. Difficulty speaking. EXAM: CT HEAD WITHOUT CONTRAST TECHNIQUE: Contiguous axial images were obtained from the base of the skull through the vertex without intravenous contrast. RADIATION DOSE REDUCTION: This exam was performed according to the departmental dose-optimization program which includes automated exposure control, adjustment of the mA and/or kV according to patient size and/or use of iterative reconstruction technique. COMPARISON:  None. FINDINGS: Brain: Mild atrophy. Moderate white matter hypodensity bilaterally. This is symmetric and most likely chronic microvascular ischemia. Negative for acute hemorrhage or mass. 8 mm hypodensity left thalamus compatible with infarct of indeterminate age. Vascular: Negative for hyperdense vessel Skull: Negative Sinuses/Orbits: Retention cyst right maxillary sinus. Remaining sinuses clear. Bilateral cataract extraction Other: None ASPECTS ([place] Stroke Program Early CT Score) - Ganglionic level infarction (caudate, lentiform nuclei, internal capsule, insula, M1-M3 cortex): 7 - Supraganglionic infarction (M4-M6 cortex): 3 Total score (0-10 with 10 being normal): 10 IMPRESSION: 1. 8 mm hypodensity left thalamus compatible with infarct of indeterminate age. Possibly acute. No acute hemorrhage. 2. ASPECTS is 10 3. Atrophy and moderate chronic microvascular ischemic change. 4. Code stroke imaging results were communicated on 04/23/2021 at 4:39 pm to provider Palikh via text page Electronically Signed   By: Franchot Gallo M.D.   On: 04/23/2021 16:41    Family Communication: At bedside  Disposition: Status is:  Inpatient Remains inpatient appropriate because: unsafe DC Planned Discharge Destination: [place], [place] 04/25/2021 11:44 AM Page by [email or web address]

## 2021-04-25 NOTE — Progress Notes (Addendum)
STROKE TEAM PROGRESS NOTE   INTERVAL HISTORY Her daughters and OT are at the bedside.  They report no improvements to patient's aphasia and L sided gaze preference.  And right hemiplegia CTH with hypodensity in the left thalamus compatible with infarct of indeterminate age. Possibly acute. No acute hemorrhage, SVD. MRI with small patchy acute cortical/subcortical infarcts within the left parietal lobe, small vessel ischemic changes, possible SAH along the right parietoccipital lobe, and micro hemorrhages.  Vitals:   04/24/21 1955 04/24/21 2300 04/25/21 0320 04/25/21 0733  BP: (!) 160/68 (!) 162/70 (!) 177/65 (!) 147/59  Pulse: (!) 51 (!) 54 (!) 51 (!) 50  Resp: 17 20 20 16   Temp: 98.5 F (36.9 C) 98.7 F (37.1 C) 98.7 F (37.1 C) 98.4 F (36.9 C)  TempSrc: Oral Oral Oral Oral  SpO2: 100% 100% 100% 100%  Weight: 95.1 kg     Height: 5\' 6"  (1.676 m)      CBC:  Recent Labs  Lab 04/23/21 1623 04/23/21 1629  WBC 9.7  --   NEUTROABS 5.7  --   HGB 9.7* 9.9*  HCT 31.8* 29.0*  MCV 104.6*  --   PLT 176  --     Basic Metabolic Panel:  Recent Labs  Lab 04/23/21 1623 04/23/21 1629  NA 142 144  K 4.5 4.4  CL 113* 113*  CO2 20*  --   GLUCOSE 160* 152*  BUN 49* 45*  CREATININE 3.13* 3.30*  CALCIUM 9.0  --     Lipid Panel:  Recent Labs  Lab 04/24/21 0258  CHOL 130  TRIG 90  HDL 34*  CHOLHDL 3.8  VLDL 18  LDLCALC 78    HgbA1c:  Recent Labs  Lab 04/24/21 0258  HGBA1C 4.5*    Urine Drug Screen: No results for input(s): LABOPIA, COCAINSCRNUR, LABBENZ, AMPHETMU, THCU, LABBARB in the last 168 hours.  Alcohol Level  Recent Labs  Lab 04/23/21 1623  ETH <10     IMAGING past 24 hours ECHOCARDIOGRAM COMPLETE  Result Date: 04/24/2021    ECHOCARDIOGRAM REPORT   Patient Name:   Rita Yoder Date of Exam: 04/24/2021 Medical Rec #:  [id number]          Height:       61.0 in Accession #:    [id number]         Weight:       214.3 lb Date of Birth:  [birth year]           BSA:          1.945 m Patient Age:    81 years           BP:           161/110 mmHg Patient Gender: F                  HR:           118 bpm. Exam Location:  Inpatient Procedure: 2D Echo Indications:    Chest pain  History:        Patient has no prior history of Echocardiogram examinations.                 Risk Factors:Hypertension.  Sonographer:    Arlyss Gandy Referring Phys: [id number] Esm[place] Comments: Image acquisition challenging due to patient body habitus. IMPRESSIONS  1. Left ventricular ejection fraction, by estimation, is 60 to 65%. The left ventricle has normal function. The left ventricle has no regional  wall motion abnormalities. There is mild left ventricular hypertrophy. Left ventricular diastolic parameters are consistent with Grade I diastolic dysfunction (impaired relaxation).  2. Right ventricular systolic function is normal. The right ventricular size is normal. Tricuspid regurgitation signal is inadequate for assessing PA pressure.  3. Left atrial size was mild to moderately dilated.  4. The mitral valve is normal in structure. No evidence of mitral valve regurgitation. No evidence of mitral stenosis.  5. The aortic valve is tricuspid. Aortic valve regurgitation is not visualized. Aortic valve sclerosis/calcification is present, without any evidence of aortic stenosis.  6. The inferior vena cava is normal in size with greater than 50% respiratory variability, suggesting right atrial pressure of 3 mmHg. FINDINGS  Left Ventricle: Left ventricular ejection fraction, by estimation, is 60 to 65%. The left ventricle has normal function. The left ventricle has no regional wall motion abnormalities. The left ventricular internal cavity size was normal in size. There is  mild left ventricular hypertrophy. Left ventricular diastolic parameters are consistent with Grade I diastolic dysfunction (impaired relaxation). Right Ventricle: The right ventricular size is normal. No increase in  right ventricular wall thickness. Right ventricular systolic function is normal. Tricuspid regurgitation signal is inadequate for assessing PA pressure. Left Atrium: Left atrial size was mild to moderately dilated. Right Atrium: Right atrial size was normal in size. Pericardium: There is no evidence of pericardial effusion. Mitral Valve: The mitral valve is normal in structure. Mild mitral annular calcification. No evidence of mitral valve regurgitation. No evidence of mitral valve stenosis. Tricuspid Valve: The tricuspid valve is normal in structure. Tricuspid valve regurgitation is not demonstrated. Aortic Valve: The aortic valve is tricuspid. Aortic valve regurgitation is not visualized. Aortic valve sclerosis/calcification is present, without any evidence of aortic stenosis. Aortic valve mean gradient measures 5.0 mmHg. Aortic valve peak gradient measures 10.2 mmHg. Aortic valve area, by VTI measures 2.70 cm. Pulmonic Valve: The pulmonic valve was normal in structure. Pulmonic valve regurgitation is not visualized. Aorta: The aortic root is normal in size and structure. Venous: The inferior vena cava is normal in size with greater than 50% respiratory variability, suggesting right atrial pressure of 3 mmHg. IAS/Shunts: No atrial level shunt detected by color flow Doppler.  LEFT VENTRICLE PLAX 2D LVIDd:         4.36 cm   Diastology LVIDs:         2.80 cm   LV e' medial:    4.35 cm/s LV PW:         1.27 cm   LV E/e' medial:  17.1 LV IVS:        1.31 cm   LV e' lateral:   4.68 cm/s LVOT diam:     2.00 cm   LV E/e' lateral: 15.9 LV SV:         100 LV SV Index:   52 LVOT Area:     3.14 cm  RIGHT VENTRICLE RV Basal diam:  3.70 cm RV Mid diam:    2.95 cm RV S prime:     10.30 cm/s TAPSE (M-mode): 2.2 cm LEFT ATRIUM             Index        RIGHT ATRIUM           Index LA diam:        3.70 cm 1.90 cm/m   RA Area:     17.50 cm 9.00 cm/m LA Vol (A2C):   53.2 ml 27.35 ml/m LA  Vol (A4C):   78.0 ml 40.10 ml/m LA  Biplane Vol: 67.8 ml 34.86 ml/m  AORTIC VALVE AV Area (Vmax):    2.30 cm AV Area (Vmean):   2.36 cm AV Area (VTI):     2.70 cm AV Vmax:           160.00 cm/s AV Vmean:          107.000 cm/s AV VTI:            0.371 m AV Peak Grad:      10.2 mmHg AV Mean Grad:      5.0 mmHg LVOT Vmax:         117.00 cm/s LVOT Vmean:        80.400 cm/s LVOT VTI:          0.319 m LVOT/AV VTI ratio: 0.86  AORTA Ao Root diam: 2.80 cm Ao Asc diam:  3.10 cm MITRAL VALVE MV Area (PHT): 1.94 cm    SHUNTS MV Decel Time: 391 msec    Systemic VTI:  0.32 m MV E velocity: 74.60 cm/s  Systemic Diam: 2.00 cm MV A velocity: 81.00 cm/s MV E/A ratio:  0.92 Dalton McleanMD Electronically signed by Franki Monte Signature Date/Time: 04/24/2021/4:11:07 PM    Final     PHYSICAL EXAM  Physical Exam  Constitutional: Appears well-developed and well-nourished obese, elderly African-American woman in no acute distress.  Psych: Affect appropriate to situation Eyes: No scleral injection HENT: No OP obstrucion MSK: no joint deformities.  Cardiovascular: Normal rate and regular rhythm.  Respiratory: Effort normal, non-labored breathing GI: Soft.  No distension. There is no tenderness.  Skin: WDI  Neuro: Mental Status: Patient is awake, alert, but not oriented to person (gives her daughter's name), place, month, year, nor situation Patient is unable to give a clear and coherent history.  Signs of both aphasia and R sided neglect. With less dysarthria .  Blinks to threat on the L and inconsistently on the R. EOM with L sided gaze preference and R palsy without ptosis. R sided facial droop resting and smiling. Hearing is intact to voice. 4/5 strength was present in LLE. RUE 0/5. LUE 4/5 when arm raised for her, did not mimic motion of arm raising. Patient did not lift RLE, but lifted LLE x 2. Patient uncooperative with FNF, did not seem to understand and did not mimic movements. Gait deferred.   ASSESSMENT/PLAN Rita Yoder  is a 81 y.o. female with history of HTN, T2DM, HLD, CKD stage IIIb, normocytic anemia, presenting with generalized weakness and right hand tingling. This visit complicated by rhythmic R hand twitching that she is unable to volitionally stop and is concerning for a focal seizure- EEG and keppra ordered (2/14).  Stroke:  left sided patchy infarcts likely secondary to embolic source Code Stroke 8 mm hypodensity left thalamus compatible with infarct of indeterminate age. Possibly acute. No acute hemorrhage, SVD MRI small patchy acute cortical/subcortical infarcts within the left parietal lobe, small vessel ischemic changes, possible SAH along the right parietoccipital lobe multiple microhemorrhages and hemosiderin deposition in the right parietal occipital lobes suggestive of possible underlying amyloid angiopathy and dementia hence she is not a good long-term anticoagulation candidate and will not need long-term cardiac monitoring for A-fib. MRA  No significant stenosis, no LVO, posterior circulation hypoplastic, fetal pattern of origin 2D Echo LVEF 60-65%, mild LVH, Grade I diastolic dysfunction, LA mild-mod dilated, tricuspid aortic valve with sclerosis/calcification.  Labs pending: HIV, TSH, RPR LDL 78  HgbA1c 4.5 VTE prophylaxis - SCDs    Diet   Diet Heart Room service appropriate? Yes; Fluid consistency: Thin   No antithrombotic prior to admission, now on aspirin 81 mg daily. ASA 81mg . DAPT not recommended due to increased risk for bleeding and aggressive risk factor modification Therapy recommendations: AIR vs SNF Disposition:  Pending PT/OT  Hypertension Home meds:  HCTZ 12.5 mg, Imdur 30 mg Unstable Permissive hypertension (OK if < 220/120) but gradually normalize in 5-7 days Long-term BP goal normotensive  Hyperlipidemia Home meds:  Atorvastatin 20mg , resumed in hospital LDL 78, goal < 70 Increase to Atrorvastatin 40mg   Continue statin at discharge  Possible Seizure  Activity Keppra load 2000mg  then 500BID EEG unable to be obtained 2/2 patient's hair   Other Stroke Risk Factors Advanced Age >/= 65  ETOH use, alcohol level <10, advised to drink no more than 1-2 drink(s) a day Substance abuse - UDS:  THC No results found for requested labs within last 26280 hours., Cocaine No results found for requested labs within last 26280 hours..  Obesity, Body mass index is 33.84 kg/m., BMI >/= 30 associated with increased stroke risk, recommend weight loss, diet and exercise as appropriate   Other Active Problems CKD IIIb Chronic normocytic anemia Cognitive impairment likely underlying amyloid angiopathy  Hospital day # 1  Rosezetta Schlatter, MD PGY-1 Neuro Psych Resident 04/25/2021  9:10 AM I have personally obtained history,examined this patient, reviewed notes, independently viewed imaging studies, participated in medical decision making and plan of care.ROS completed by me personally and pertinent positives fully documented  I have made any additions or clarifications directly to the above note. Agree with note above.  Patient remains with significant aphasia and  Right hemiparesis..  Family would like inpatient rehab but rehab team recommends SNF.  Patient is not a good long-term anticoagulation candidate and will not do prolonged cardiac monitoring. As   MRI suggesting hemosiderosis and multiple microhemorrhages. Continue Keppra for seizures and aspirin alone for stroke .transfer to SNF rehab when bed available.  .   Stroke team will sign off.  Long discussion with patient's 2 daughters at the bedside and answered questions.  Discussed with Dr. Thurmond Butts.  Greater than 50% time during this 35-minute visit was spent in counseling about her embolic stroke discussion with patient about treatment and prevention and answering questions.  Antony Contras, MD Medical Director [facility] Pager: [phone] 04/25/2021 12:40 PM    To contact Stroke Continuity  provider, please refer to [email or web address] After hours, contact General Neurology

## 2021-04-26 ENCOUNTER — Encounter: Payer: Self-pay | Admitting: Internal Medicine

## 2021-04-26 LAB — GLUCOSE, CAPILLARY
Glucose-Capillary: 90 mg/dL (ref 70–99)
Glucose-Capillary: 133 mg/dL — ABNORMAL HIGH (ref 70–99)
Glucose-Capillary: 91 mg/dL (ref 70–99)
Glucose-Capillary: 94 mg/dL (ref 70–99)

## 2021-04-26 MED ORDER — LOPERAMIDE HCL 2 MG PO CAPS
2.0000 mg | ORAL_CAPSULE | ORAL | Status: DC | PRN
  Administered 2021-04-26: 2 mg via ORAL
  Filled 2021-04-26: qty 1

## 2021-04-26 NOTE — Significant Event (Addendum)
TRH night coverage note:  Called by RN: informed that pt went into A.Fib, HR 100-110, asymptomatic.  1) will leave HR alone for the moment since  A) pt asymptomatic and  B) looks like shes been S.Brady for much of the admit when not in A.Fib. 2) holding off on starting [facility] for the moment given very recent acute strokes, also concern for amyloid angiopathy.  Ill send a message to neurology on call to make sure stroke team looks at this in AM.

## 2021-04-26 NOTE — Progress Notes (Signed)
Physical Therapy Treatment Patient Details Name: Rita Yoder MRN: [id number] DOB: 06-03-1940 Today's Date: 04/26/2021   History of Present Illness 81 y/o female presenting on 2/14 with R sided numbness. MRI with small patchy acute cortical/subcortical infarcts in L parietal lobe. Noted worsening dysarthria and twitching of R hand 2/15- plan for EEG.  PMH includes: CKD, HTN.    PT Comments    Pt admitted with above diagnosis. Pt was able to stand to [facility] with less assist each stand. Pt was fatigued fairly quickly as she has been up in chair today.  Granddaughter very attentive to pt.  Pt is progressing needing less assist for bed mobility. Pt continues to need cues for attention to right hemibody as pt with poor awareness currently.  Pt currently with functional limitations due to balance and endurance deficits. Pt will benefit from skilled PT to increase their independence and safety with mobility to allow discharge to the venue listed below.      Recommendations for follow up therapy are one component of a multi-disciplinary discharge planning process, led by the attending physician.  Recommendations may be updated based on patient status, additional functional criteria and insurance authorization.  Follow Up Recommendations  Acute inpatient rehab (3hours/day)     Assistance Recommended at Discharge Frequent or constant Supervision/Assistance  Patient can return home with the following Two people to help with walking and/or transfers;A lot of help with bathing/dressing/bathroom;Assistance with cooking/housework;Assist for transportation;Help with stairs or ramp for entrance   Equipment Recommendations  Rolling walker (2 wheels)    Recommendations for Other Services Rehab consult     Precautions / Restrictions Precautions Precautions: Fall Restrictions Weight Bearing Restrictions: No     Mobility  Bed Mobility Overal bed mobility: Needs Assistance Bed Mobility: Supine to  Sit, Sit to Supine     Supine to sit: HOB elevated, Min guard Sit to supine: Mod assist   General bed mobility comments: Min guard assist to come to sitting with pt initiiating long sitting well.  Did not need  assist to scoot hips to EOB    Transfers Overall transfer level: Needs assistance   Transfers: Sit to/from Stand, Bed to chair/wheelchair/BSC Sit to Stand: Mod assist, +2 physical assistance, From elevated surface, Min assist           General transfer comment: Pt able to stand to [facility] with mod assist to stand initially but once up pt was able to stand with min guard to min assist and cues to maintain hip extension as well as assist to keep right UE in place on Stedy  Stood from the [place] min assist each subsequent attempt practicing sit to stand and weight shifting. Pt recently back to bed so did not transfer to chair.  Fatigued after 3- 2 min stands. Transfer via Lift Equipment: Stedy  Ambulation/Gait               General Gait Details: pt declined to try   Stairs             Wheelchair Mobility    Modified Rankin (Stroke Patients Only)       Balance Overall balance assessment: Needs assistance Sitting-balance support: Feet supported, Single extremity supported Sitting balance-Leahy Scale: Fair Sitting balance - Comments: can sit for extended period of time at EOB on her own with guard assist and cues .   Standing balance support: Bilateral upper extremity supported, During functional activity, Reliant on assistive device for balance Standing balance-Leahy Scale: Poor Standing  balance comment: Relies on Support of Stedy and external support to stand                            Cognition Arousal/Alertness: Awake/alert Behavior During Therapy: Flat affect Overall Cognitive Status: Difficult to assess (due to aphasia) Area of Impairment: Problem solving, Awareness, Following commands, Attention, Orientation                  Orientation Level: Situation, Time Current Attention Level: Selective   Following Commands: Follows one step commands inconsistently, Follows one step commands with increased time   Awareness: Intellectual Problem Solving: Slow processing, Requires verbal cues, Requires tactile cues General Comments: Pt with visual impairment with double vision. Pt with inattention to left hemibody with difficulty getting pt to turn head or eyes past midline.        Exercises General Exercises - Lower Extremity Long Arc Quad: AROM, Both, 10 reps, Seated Heel Slides: AROM, Both, 10 reps, Supine    General Comments        Pertinent Vitals/Pain Pain Assessment Pain Assessment: No/denies pain    Home Living                          Prior Function            PT Goals (current goals can now be found in the care plan section) Acute Rehab PT Goals Patient Stated Goal: none stated Progress towards PT goals: Progressing toward goals    Frequency    Min 4X/week      PT Plan Current plan remains appropriate    Co-evaluation              AM-PAC PT "6 Clicks" Mobility   Outcome Measure  Help needed turning from your back to your side while in a flat bed without using bedrails?: A Lot Help needed moving from lying on your back to sitting on the side of a flat bed without using bedrails?: A Lot Help needed moving to and from a bed to a chair (including a wheelchair)?: Total Help needed standing up from a chair using your arms (e.g., wheelchair or bedside chair)?: Total Help needed to walk in hospital room?: Total Help needed climbing 3-5 steps with a railing? : Total 6 Click Score: 8    End of Session Equipment Utilized During Treatment: Gait belt Activity Tolerance: Patient limited by fatigue Patient left: with call bell/phone within reach;with family/visitor present;in bed;with bed alarm set Nurse Communication: Mobility status;Need for lift equipment (Stedy vs  Maximove) PT Visit Diagnosis: Muscle weakness (generalized) (M62.81);Difficulty in walking, not elsewhere classified (R26.2)     Time: 4431-5400 PT Time Calculation (min) (ACUTE ONLY): 22 min  Charges:  $Therapeutic Activity: 8-22 mins                     Georgetta Crafton M,PT Acute Rehab Services [phone] (pager)    Alvira Philips 04/26/2021, 4:40 PM

## 2021-04-26 NOTE — Progress Notes (Signed)
Now in sinus tachycardia with HR 110-125's remain asymptomatic and resting comfortably. Granddaughter at bedside. Message Dr. Alcario Drought thru [place] with update.

## 2021-04-26 NOTE — Progress Notes (Signed)
Inpatient Rehab Admissions Coordinator:   I met with pt. And family to discuss potential CIR admission. Pt. Is interested, family states that they can provide insurance auth. I will open a case for insurance auth and pursue for potential admission.   Clemens Catholic, [place], [place] Admissions Coordinator  [phone] ([facility]) [phone] (office)

## 2021-04-26 NOTE — Progress Notes (Signed)
Progress Note  Patient: Rita Yoder VFI:[id number] DOB: 08-28-1940  DOA: 04/23/2021  DOS: 04/26/2021    Brief hospital course: Rita Yoder is a 81 y.o. female with medical history significant of HTN, IIDM, HLD, stage IIIb, normocytic anemia, came with new onset of right arm and hand numbness tingling.  Symptoms started around 1 PM, she denies any weakness of any of the limbs.  According to daughter, patient was briefly not talking and gaze to the right side but resolved soon.  At that time patient arrived in the ED, most of her symptoms of right-sided numbness has resolved.  Denies any headache or blurry vision.  She used to take aspirin which was discontinued by PCP several years ago.  Daughter reported patient blood pressure has been fairly controlled at home SBP= 140s.   ED Course: Blood pressure elevated SBP>160.  Creatinine 3.1, bicarb 20, BUN 49, glucose 160 hemoglobin 9.7 compared to baseline 10.3.   CT had hypodensity of left thalamus suspicious for acute/subacute stroke. MRI revealed left sided patchy infarcts suggestive of embolic source, small vessel ischemic changes, and possible SAH along the right parieto-occipital lobe. Due to concern for microhemorrhages, DAPT not recommended, and the patient was started on aspirin alone. Due to right hand twitching and concern for focal seizure, keppra was loaded, with resolution of tremor. EEG not performed due to glued wig in place. due to her deficits, acute inpatient rehabilitation was recommended, though CIR recommends SNF placement due to pt's inability to tolerate intensity of CIR program. This is being pursued.    Assessment and Plan: Acute multifocal strokes including left ischemic thalamus stroke and left parietal cortical and subcortical infarcts with concomitant right-sided weakness.  - Could consider loop recorder for suspicion of cardioembolic mechanism, though there is concern for right parietal microhemorrhages suggestive of  cerebral amyloid angiopathy and previous SAH. Pt is a poor long term anticoagulation candidate per neurology, so will continue with aspirin alone. - Continue ASA - LDL 78, slightly above goal of 70, augmented atorvastatin 20mg  > 40mg . - Permissive HTN as below - Echo without IAS or CES. - PT recommends CIR at discharge. CIR formally consulted.     Suspicion for focal seizure:  - Continue keppra. Pt's wig is not able to be removed for EEG.    HTN:  - Permissive HTN x48 hours. Plan to restart imdur once cleared by neurology. Note bradycardia would limit use of AV nodal agents, and CrCl would currently suggest diminished effectiveness of thiazide.   T2DM: HbA1c 4.5%.  - SSI stopped due to hypoglycemia.   CKD stage IIIb - Euvolemic, creatinine level stable.  No acute electrolyte or acid-base abnormalities. Recheck BMP to confirm this is improved. Holding HCTZ.   Anemia of CKD: Stable - Outpatient follow-up with PCP and nephrology.  Mildly elevated TSH:  - Recheck after discharge.   Obesity: Estimated body mass index is 40.49 kg/m as calculated from the following:   Height as of 03/25/21: 5\' 1"  (1.549 m).   Weight as of 03/25/21: 97.2 kg.  Obesity: Estimated body mass index is 33.84 kg/m as calculated from the following:   Height as of this encounter: 5\' 6"  (1.676 m).   Weight as of this encounter: 95.1 kg.  Subjective: Becoming more alert, eating better, worked with PT much more yesterday, wants to go to CIR. No chest pain, dyspnea, HA, palpitations reported.  Objective: Vitals:   04/25/21 2000 04/26/21 0005 04/26/21 0450 04/26/21 0716  BP: (!) 163/72 (!) 154/80 Marland Kitchen)  162/89 (!) 165/64  Pulse: (!) 55 (!) 51 (!) 56 (!) 56  Resp:  16 16   Temp: 99.2 F (37.3 C) 99 F (37.2 C) 99.1 F (37.3 C) 98 F (36.7 C)  TempSrc: Oral Oral Oral Oral  SpO2:  100% 100% 100%  Weight:      Height:      Gen: 81 y.o. female in no distress Pulm: Nonlabored breathing room air. Clear. CV:  Regular bradycardia ~60bpm. No murmur, rub, or gallop. No JVD, no pitting dependent edema. GI: Abdomen soft, non-tender, non-distended, with normoactive bowel sounds.  Ext: Warm, no deformities Skin: No new rashes, lesions or ulcers on visualized skin. Neuro: Alert and mostly oriented, interactive, R side neglect with weakness but able to move R extremities when physically prompted. No new focal neurological deficits. Psych: Judgement and insight appear impaired but improved. Mood euthymic & affect congruent. Behavior is appropriate.    Data Personally reviewed: WBC 9.7k, hgb 9.7 > 9.9. Plt 176.  Creatinine 3.13 > 3.30.  LFTs wnl HDL 34, LDL 78 TSH 5.275.  MR brain 2/14: Patchy acute cortical/subcortical left parietal lobe infarcts on background moderate-severe chronic small vessel ischemic disease. Hemosiderin and chronic microhemorrhages within/along right parietooccipital lobe suggest remote [facility] and may reflect cerebral amyloid angiopathy.  Family Communication: Granddaughter and son in person  Disposition: Status is: Inpatient Remains inpatient appropriate because: unsafe DC. MEDICALLY STABLE FOR DISCHARGE. Planned Discharge Destination: Skilled nursing facility vs. CIR (PM&R consult pending)  Patrecia Pour, MD 04/26/2021 10:02 AM Page by [email or web address]

## 2021-04-26 NOTE — Care Management Important Message (Signed)
Important Message  Patient Details  Name: Rita Yoder MRN: [id number] Date of Birth: [birth year]   Medicare Important Message Given:  Yes     Orbie Pyo 04/26/2021, 2:19 PM

## 2021-04-27 LAB — GLUCOSE, CAPILLARY
Glucose-Capillary: 102 mg/dL — ABNORMAL HIGH (ref 70–99)
Glucose-Capillary: 103 mg/dL — ABNORMAL HIGH (ref 70–99)
Glucose-Capillary: 65 mg/dL — ABNORMAL LOW (ref 70–99)
Glucose-Capillary: 70 mg/dL (ref 70–99)
Glucose-Capillary: 118 mg/dL — ABNORMAL HIGH (ref 70–99)

## 2021-04-27 LAB — BASIC METABOLIC PANEL
Anion gap: 9 (ref 5–15)
BUN: 46 mg/dL — ABNORMAL HIGH (ref 8–23)
CO2: 21 mmol/L — ABNORMAL LOW (ref 22–32)
Calcium: 9 mg/dL (ref 8.9–10.3)
Chloride: 109 mmol/L (ref 98–111)
Creatinine, Ser: 2.8 mg/dL — ABNORMAL HIGH (ref 0.44–1.00)
GFR, Estimated: 16 mL/min — ABNORMAL LOW (ref >60)
Glucose, Bld: 87 mg/dL (ref 70–99)
Potassium: 4.5 mmol/L (ref 3.5–5.1)
Sodium: 139 mmol/L (ref 135–145)

## 2021-04-27 MED ORDER — APIXABAN 2.5 MG PO TABS
2.5000 mg | ORAL_TABLET | Freq: Two times a day (BID) | ORAL | Status: DC
  Administered 2021-04-27 – 2021-05-02 (×10): 2.5 mg via ORAL
  Filled 2021-04-27 (×10): qty 1

## 2021-04-27 NOTE — Plan of Care (Signed)
°  Problem: Education: Goal: Knowledge of patient specific risk factors will improve (INDIVIDUALIZE FOR PATIENT) Outcome: Progressing   Problem: Self-Care: Goal: Ability to communicate needs accurately will improve Outcome: Progressing   Problem: Ischemic Stroke/TIA Tissue Perfusion: Goal: Complications of ischemic stroke/TIA will be minimized Outcome: Progressing

## 2021-04-27 NOTE — Progress Notes (Signed)
ANTICOAGULATION CONSULT NOTE - Initial Consult  Pharmacy Consult for heparin Indication: atrial fibrillation  Allergies  Allergen Reactions   Losartan Other (See Comments)    MD discontinued this because of decreased kidney function    Patient Measurements: Height: 5\' 6"  (167.6 cm) Weight: 95.1 kg (209 lb 10.5 oz) IBW/kg (Calculated) : 59.3    Vital Signs: Temp: 98.2 F (36.8 C) (02/18 1233) Temp Source: Oral (02/18 1233) BP: 159/68 (02/18 1233) Pulse Rate: 53 (02/18 1233)  Labs: Recent Labs    04/27/21 0430  CREATININE 2.80*    Estimated Creatinine Clearance: 18.3 mL/min (A) (by C-G formula based on SCr of 2.8 mg/dL (H)).   Medical History: Past Medical History:  Diagnosis Date   Chronic kidney disease 12/2009   Diabetes mellitus 2010   Dyslipidemia    Hypertension    Monoclonal gammopathy 03/2010    Medications:  Medications Prior to Admission  Medication Sig Dispense Refill Last Dose   acetaminophen (TYLENOL) 500 MG tablet Take 500 mg by mouth every 6 (six) hours as needed for headache or mild pain.   unk   atorvastatin (LIPITOR) 20 MG tablet TAKE 1 TABLET BY MOUTH ONCE DAILY (Patient taking differently: Take 20 mg by mouth daily.) 90 tablet 3 04/22/2021   Cholecalciferol (VITAMIN D3) 50 MCG (2000 UT) capsule Take 2,000 Units by mouth daily.   04/22/2021   hydrochlorothiazide (MICROZIDE) 12.5 MG capsule TAKE 1 CAPSULE BY MOUTH  DAILY (Patient taking differently: Take 12.5 mg by mouth daily.) 90 capsule 3 04/22/2021   isosorbide mononitrate (IMDUR) 30 MG 24 hr tablet TAKE 1 TABLET(30 MG) BY MOUTH DAILY (Patient taking differently: Take 30 mg by mouth daily.) 90 tablet 1 04/22/2021   levothyroxine (SYNTHROID) 25 MCG tablet TAKE 1 TABLET(25 MCG) BY MOUTH EVERY MORNING (Patient taking differently: Take 25 mcg by mouth daily before breakfast.) 90 tablet 0 04/22/2021 at am   nitroGLYCERIN (NITROSTAT) 0.4 MG SL tablet DISSOLVE 1 TABLET UNDER THE TONGUE AT FIRST SIGN OF  ATTACKS, MAY REPEAT EVERY 5 MINUTES UP TO 3 TABLETS, IF NO RELLIEF SEEK MEDICAL HELP (Patient taking differently: Place 0.4 mg under the tongue every 5 (five) minutes x 3 doses as needed for chest pain (SEEK HELP, IF NO RELIEF).) 25 tablet 1 unk   solifenacin (VESICARE) 5 MG tablet Take 5 mg by mouth daily.   04/22/2021   Blood Glucose Calibration (OT ULTRA/FASTTK CNTRL SOLN) SOLN       Scheduled:   atorvastatin  40 mg Oral Daily   darifenacin  7.5 mg Oral Daily   levETIRAcetam  500 mg Oral BID   levothyroxine  25 mcg Oral Q0600    Assessment: 81 yo female with CVA and new onset afib. Pharmacy consulted to dose apixaban -SCr= 2.8 (down; BL ~ 2-2.7) -Hg= 9.9 on 2/14   Goal of Therapy:  Monitor platelets by anticoagulation protocol: Yes   Plan:  -Start apixaban 2.5mg  po bid -Will look into medication cost on Monday  Hildred Laser, PharmD Clinical Pharmacist **Pharmacist phone directory can now be found on [email or web address] ([email or web address]).  Listed under [place].

## 2021-04-27 NOTE — Progress Notes (Signed)
Progress Note  Patient: Rita Yoder IDP:[id number] DOB: 1940-09-09  DOA: 04/23/2021  DOS: 04/27/2021    Brief hospital course: Rita Yoder is a 81 y.o. female with medical history significant of HTN, IIDM, HLD, stage IIIb, normocytic anemia, came with new onset of right arm and hand numbness tingling.  Symptoms started around 1 PM, she denies any weakness of any of the limbs.  According to daughter, patient was briefly not talking and gaze to the right side but resolved soon.  At that time patient arrived in the ED, most of her symptoms of right-sided numbness has resolved.  Denies any headache or blurry vision.  She used to take aspirin which was discontinued by PCP several years ago.  Daughter reported patient blood pressure has been fairly controlled at home SBP= 140s.   ED Course: Blood pressure elevated SBP>160.  Creatinine 3.1, bicarb 20, BUN 49, glucose 160 hemoglobin 9.7 compared to baseline 10.3.   CT had hypodensity of left thalamus suspicious for acute/subacute stroke. MRI revealed left sided patchy infarcts suggestive of embolic source, small vessel ischemic changes, and possible SAH along the right parieto-occipital lobe. Due to concern for microhemorrhages, DAPT not recommended, and the patient was started on aspirin alone. Due to right hand twitching and concern for focal seizure, keppra was loaded, with resolution of tremor. EEG not performed due to glued wig in place. due to her deficits, acute inpatient rehabilitation was recommended, though CIR recommends SNF placement due to pt's inability to tolerate intensity of CIR program. This is being pursued.    Assessment and Plan: Acute multifocal strokes including left ischemic thalamus stroke and left parietal cortical and subcortical infarcts with concomitant right-sided weakness.  - Given detection of atrial fibrillation on 2/17 - 2/18, discussed with neurology. We will initiate anticoagulation with DOAC and DC DAPT.   -  LDL 78, slightly above goal of 70, augmented atorvastatin 20mg  > 40mg . - Permissive HTN as below - Echo without IAS or CES. - PT recommends CIR at discharge. CIR formally consulted.     New diagnosis paroxysmal atrial fibrillation:  - Start DOAC, eliquis preferred with lower risk of GI and IC hemorrhage.  - Avoiding AV nodal agents at this time with baseline sinus bradycardia.  Suspicion for focal seizure:  - Continue keppra. Pt's wig is not able to be removed for EEG.    HTN:  - Permissive HTN x48 hours. Plan to restart imdur 2/19 if ok w/neurology. Note bradycardia would limit use of AV nodal agents, and CrCl would currently suggest diminished effectiveness of thiazide.   T2DM: HbA1c 4.5%.  - SSI stopped due to hypoglycemia.   CKD stage IIIb - Euvolemic, creatinine level stable.  No acute electrolyte or acid-base abnormalities. Recheck BMP to confirm this is improved. Holding HCTZ.   Anemia of CKD: Stable - Outpatient follow-up with PCP and nephrology.  Mildly elevated TSH:  - Recheck after discharge.   Obesity: Estimated body mass index is 40.49 kg/m as calculated from the following:   Height as of 03/25/21: 5\' 1"  (1.549 m).   Weight as of 03/25/21: 97.2 kg.  Obesity: Estimated body mass index is 33.84 kg/m as calculated from the following:   Height as of this encounter: 5\' 6"  (1.676 m).   Weight as of this encounter: 95.1 kg.  Subjective: Continues to feel stronger day by day, still very weak on right. Eating better. No chest pain, dyspnea or palpitations overnight.  Objective: Vitals:   04/26/21 4431 04/27/21 0402  04/27/21 0852 04/27/21 1233  BP: (!) 168/79 (!) 159/67 (!) 155/54 (!) 159/68  Pulse: 76 65 (!) 51 (!) 53  Resp: 20 14 16    Temp: 98.6 F (37 C) 98.6 F (37 C) 98.1 F (36.7 C) 98.2 F (36.8 C)  TempSrc: Oral Oral Oral Oral  SpO2: 100% 100% 100% 99%  Weight:      Height:      Gen: 81 y.o. female in no distress Pulm: Nonlabored breathing room air.  Clear. CV: Regular borderline bradycardia without murmur, rub, or gallop. No JVD, no dependent edema. GI: Abdomen soft, non-tender, non-distended, with normoactive bowel sounds.  Ext: Warm, no deformities Skin: No rashes, lesions or ulcers on visualized skin. Neuro: Alert and oriented. Stable right neglect and weakness. No new focal neurological deficits. Psych: Judgement and insight appear fair. Mood euthymic & affect congruent. Behavior is appropriate.    Telemetry (personal review): Irregular mild tachycardia with absence of p waves overnight that terminated spontaneously, consistent with atrial fibrillation.  Data Personally reviewed: WBC 9.7k, hgb 9.7 > 9.9. Plt 176.  Creatinine 3.13 > 3.30.  LFTs wnl HDL 34, LDL 78 TSH 5.275.  MR brain 2/14: Patchy acute cortical/subcortical left parietal lobe infarcts on background moderate-severe chronic small vessel ischemic disease. Hemosiderin and chronic microhemorrhages within/along right parietooccipital lobe suggest remote [facility] and may reflect cerebral amyloid angiopathy.  Family Communication: Granddaughter and son in person  Disposition: Status is: Inpatient Remains inpatient appropriate because: unsafe DC. MEDICALLY STABLE FOR DISCHARGE. Planned Discharge Destination: Skilled nursing facility vs. CIR (insurance auth and bed availability pending)  Patrecia Pour, MD 04/27/2021 3:27 PM Page by [email or web address]

## 2021-04-27 NOTE — Progress Notes (Signed)
Hypoglycemic Event  CBG: 65  Treatment: 4 oz juice/soda  Symptoms: None  Follow-up CBG: Time: 1704 CBG Result:70  Possible Reasons for Event: Inadequate meal intake  Comments/MD notified: Mathews Robinsons MD

## 2021-04-27 NOTE — Progress Notes (Signed)
Tele called and said patient is in A.fib HR 100-113. Went to her room and assesed, she is asymptomatic and lying comfortably in her bed. Paged Dr. Alcario Drought.

## 2021-04-28 LAB — GLUCOSE, CAPILLARY
Glucose-Capillary: 86 mg/dL (ref 70–99)
Glucose-Capillary: 119 mg/dL — ABNORMAL HIGH (ref 70–99)
Glucose-Capillary: 149 mg/dL — ABNORMAL HIGH (ref 70–99)
Glucose-Capillary: 148 mg/dL — ABNORMAL HIGH (ref 70–99)

## 2021-04-28 MED ORDER — ISOSORBIDE MONONITRATE ER 30 MG PO TB24
30.0000 mg | ORAL_TABLET | Freq: Every day | ORAL | Status: DC
  Administered 2021-04-28 – 2021-05-02 (×5): 30 mg via ORAL
  Filled 2021-04-28 (×5): qty 1

## 2021-04-28 NOTE — Progress Notes (Signed)
Progress Note  Patient: Rita Yoder HRC:[id number] DOB: 1940/12/21  DOA: 04/23/2021  DOS: 04/28/2021    Brief hospital course: Rita Yoder is a 81 y.o. female with medical history significant of HTN, IIDM, HLD, stage IIIb, normocytic anemia, came with new onset of right arm and hand numbness tingling.  Symptoms started around 1 PM, she denies any weakness of any of the limbs.  According to daughter, patient was briefly not talking and gaze to the right side but resolved soon.  At that time patient arrived in the ED, most of her symptoms of right-sided numbness has resolved.  Denies any headache or blurry vision.  She used to take aspirin which was discontinued by PCP several years ago.  Daughter reported patient blood pressure has been fairly controlled at home SBP= 140s.   ED Course: Blood pressure elevated SBP>160.  Creatinine 3.1, bicarb 20, BUN 49, glucose 160 hemoglobin 9.7 compared to baseline 10.3.   CT had hypodensity of left thalamus suspicious for acute/subacute stroke. MRI revealed left sided patchy infarcts suggestive of embolic source, small vessel ischemic changes, and possible SAH along the right parieto-occipital lobe. Due to concern for microhemorrhages, DAPT not recommended, and the patient was started on aspirin alone. Due to right hand twitching and concern for focal seizure, keppra was loaded, with resolution of tremor. EEG not performed due to glued wig in place. due to her deficits, acute inpatient rehabilitation was recommended, though CIR recommends SNF placement due to pt's inability to tolerate intensity of CIR program. This is being pursued.    Assessment and Plan: Acute multifocal strokes including left ischemic thalamus stroke and left parietal cortical and subcortical infarcts with concomitant right-sided weakness.  - Given detection of atrial fibrillation on 2/17 - 2/18, started DOAC and DC DAPT.   - LDL 78, slightly above goal of 70, augmented atorvastatin  20mg  > 40mg . - Echo without IAS or CES. - PT recommends CIR at discharge. CIR formally consulted.     New diagnosis paroxysmal atrial fibrillation: Back in sinus rhythm quickly. - Started DOAC, eliquis preferred with lower risk of GI and IC hemorrhage.  - Avoiding AV nodal agents at this time with baseline sinus bradycardia.  Suspicion for focal seizure:  - Continue keppra. Pt's wig is not able to be removed for EEG.    HTN:  - Restart imdur 2/19. Note bradycardia would limit use of AV nodal agents, and CrCl would currently suggest diminished effectiveness of thiazide.   T2DM: HbA1c 4.5%.  - SSI stopped due to hypoglycemia. Encourage frequent meals   CKD stage IIIb: CrCl ~68ml/min. - Euvolemic, creatinine level stable/improved from admission to baseline.  No acute electrolyte or acid-base abnormalities. Holding HCTZ.   Anemia of CKD: Stable - Outpatient follow-up with PCP and nephrology.  Mildly elevated TSH: Not diagnostic in acute setting. - Recheck after discharge.   Obesity: Estimated body mass index is 40.49 kg/m as calculated from the following:   Height as of 03/25/21: 5\' 1"  (1.549 m).   Weight as of 03/25/21: 97.2 kg.  Obesity: Estimated body mass index is 33.84 kg/m as calculated from the following:   Height as of this encounter: 5\' 6"  (1.676 m).   Weight as of this encounter: 95.1 kg.  Subjective: Had low blood sugar yesterday improved with juice. Being encouraged to eat more often. No new numbness or weakness or other complaints.   Objective: Vitals:   04/27/21 2327 04/28/21 0407 04/28/21 0916 04/28/21 1126  BP: (!) 143/65 Marland Kitchen)  152/64 (!) 187/71 (!) 149/78  Pulse: (!) 56 (!) 57 (!) 57 61  Resp: 18 20 18 20   Temp: 98.2 F (36.8 C) 98.5 F (36.9 C) 98.7 F (37.1 C) 98.9 F (37.2 C)  TempSrc: Oral Oral Oral Oral  SpO2: 100% 100% 98% 100%  Weight:      Height:      Gen: 81 y.o. female in no distress Pulm: Nonlabored breathing room air. Clear. CV: Regular  rate and rhythm. No murmur, rub, or gallop. No JVD, no pitting dependent edema. GI: Abdomen soft, non-tender, non-distended, with normoactive bowel sounds.  Ext: Warm, no deformities Skin: No new rashes, lesions or ulcers on visualized skin. Neuro: Alert and oriented. Right visual and extremity neglect, weakness without new focal neurological deficits. Psych: Judgement and insight appear fair. Mood euthymic & affect congruent. Behavior is appropriate.    Telemetry (personal review): Sinus rhythm/bradycardia, did have atrial fibrillation 2/18.  Data Personally reviewed: WBC 9.7k, hgb 9.7 > 9.9. Plt 176.  Creatinine 3.13 > 3.30.  LFTs wnl HDL 34, LDL 78 TSH 5.275.  MR brain 2/14: Patchy acute cortical/subcortical left parietal lobe infarcts on background moderate-severe chronic small vessel ischemic disease. Hemosiderin and chronic microhemorrhages within/along right parietooccipital lobe suggest remote [facility] and may reflect cerebral amyloid angiopathy.  Family Communication: Granddaughter at bedside  Disposition: Status is: Inpatient Remains inpatient appropriate because: unsafe DC. MEDICALLY STABLE FOR DISCHARGE. Planned Discharge Destination: Skilled nursing facility vs. CIR (insurance auth and bed availability pending)  Patrecia Pour, MD 04/28/2021 12:27 PM Page by [email or web address]

## 2021-04-28 NOTE — Discharge Instructions (Signed)

## 2021-04-29 ENCOUNTER — Other Ambulatory Visit (HOSPITAL_COMMUNITY): Payer: Self-pay

## 2021-04-29 LAB — GLUCOSE, CAPILLARY
Glucose-Capillary: 107 mg/dL — ABNORMAL HIGH (ref 70–99)
Glucose-Capillary: 153 mg/dL — ABNORMAL HIGH (ref 70–99)
Glucose-Capillary: 163 mg/dL — ABNORMAL HIGH (ref 70–99)
Glucose-Capillary: 111 mg/dL — ABNORMAL HIGH (ref 70–99)

## 2021-04-29 NOTE — PMR Pre-admission (Signed)
PMR Admission Coordinator Pre-Admission Assessment  Patient: Rita Yoder is an 81 y.o., female MRN: [id number] DOB: [birth year] Height: $RemoveBeforeDE'5\' 6"'[id number]$  (167.6 cm) Weight: 95.1 kg  Insurance Information HMO:     PPO:      PCP:      IPA:      80/20:      OTHER:  PRIMARY: U[employer] Medicare      Policy#: [id number]      Subscriber: Pt. CM Name:       Phone#: [phone]     Fax#: [phone] Pre-Cert#: [id number]  from Gilmore Laroche at [facility] for 7 days with update due on 05/06/21     Employer:  Benefits:  Phone #: [phone]    Name: [email or web address] on line Eff Date: 03/10/2021 - still active Deductible: $0 (does not have deductible) OOP Max: $4,500 ($45 met) CIR: $325/day co-pay for days 1-5, $0/day co-pay for days 6+ SNF: $0/day copay for days 1-20, $196/day copay for days 21-43, $0/day copay for days 44-100; limited to 100 days/cal yr Outpatient: $20/visit co-pay Home Health:  100% coverage, 0% co-insurance; limited by medical necessity DME: 80% coverage; 20% co-insurance Providers: in network  SECONDARY: none      Policy#:      Phone#:    Development worker, community: none      Phone#:   The Actuary for patients in Inpatient Rehabilitation Facilities with attached Privacy Act [place] Records was provided and verbally reviewed with: Patient and Family  Emergency Contact Information Contact Information     Name Relation Home Work Mobile   [name] Son [phone]     Sales,Rita Yoder Daughter   [phone]   Rita Yoder, [name] Daughter   [phone]       Current Medical History  Patient Admitting Diagnosis: L CVA  History of Present Illness: An 81 year old right-handed female with history of nonobstructive CAD, hypertension, hypothyroidism, type II diabetes mellitus, hyperlipidemia, normocytic anemia/monoclonal gammopathy followed by Dr. Irene Limbo, CKD stage III.  Per chart review independent prior to admission occasional straight point cane due to left  hip pain.  1 level home with level entry.  She has a son and daughter with good support.  Presented 04/23/2021 with acute onset of right side weakness and word finding difficulties.  Cranial CT scan showed 8 mm hypodensity left thalamus compatible with infarct of indeterminate age.  No acute hemorrhage.  MRA head and neck no intracranial large vessel occlusion identified.  Multiple sites of moderate/severe stenosis within the P2 left PCA.  Severe stenosis within the mid to distal M1 left MCA.  MRI follow-up small patchy acute cortical subcortical infarct within the left parietal lobe.  Background moderate to moderately advanced chronic small vessel ischemic changes within the cerebral white matter additionally, clustered chronic parenchymal microhemorrhages present within the right parieto-occipital lobes.  Patient did not receive tPA.  Admission chemistries unremarkable except chloride 113 glucose 160 BUN 49 creatinine 3.13, hemoglobin 9.7, alcohol negative, hemoglobin A1c 4.5, TSH 5.275.  Echocardiogram ejection fraction of 60 to 65% no wall motion abnormalities grade 1 diastolic dysfunction.  Neurology follow-up due to concerns for microhemorrhages DAPT not recommended and initially placed on aspirin alone.  Patient did develop some right hand twitching concern for focal seizure loaded with Keppra with resolution of tremor.  EEG not performed due to glued wig in place.  Hospital course PAF with RVR received IV metoprolol and subsequently had episode of bradycardia with heart rate in the 50s with cardiology services consulted.  She was cleared to begin  Eliquis for both atrial fibrillation and CVA prophylaxis and initial aspirin discontinued.  Cardiac rate remains controlled maintained on amiodarone 100 mg daily.  AKI on CKD with baseline creatinine 2.8 her HCTZ was on hold.  Tolerating a regular consistency diet.  Therapy evaluations completed due to patient decreased functional mobility right side weakness and  word finding difficulties.  Patient to be admitted for a comprehensive inpatient rehab program.  Complete NIHSS TOTAL: 9  Patient's medical record from [facility]  has been reviewed by the rehabilitation admission coordinator and physician.  Past Medical History  Past Medical History:  Diagnosis Date   Chronic kidney disease 12/2009   Diabetes mellitus 2010   Dyslipidemia    Hypertension    Monoclonal gammopathy 03/2010    Has the patient had major surgery during 100 days prior to admission? No  Family History   family history includes Cancer in her maternal aunt and maternal uncle; Healthy in her mother; Heart Problems in her maternal grandmother; Heart attack in her brother.  Current Medications  Current Facility-Administered Medications:    acetaminophen (TYLENOL) tablet 500 mg, 500 mg, Oral, Q6H PRN, Wynetta Fines T, MD   amiodarone (PACERONE) tablet 100 mg, 100 mg, Oral, Daily, Harwani, Mohan, MD, 100 mg at 05/02/21 0814   amLODipine (NORVASC) tablet 5 mg, 5 mg, Oral, Daily, Regalado, Belkys A, MD, 5 mg at 05/02/21 0817   apixaban (ELIQUIS) tablet 2.5 mg, 2.5 mg, Oral, BID, Kris Mouton, RPH, 2.5 mg at 05/02/21 0813   atorvastatin (LIPITOR) tablet 40 mg, 40 mg, Oral, Daily, Vance Gather B, MD, 40 mg at 05/02/21 0814   cefTRIAXone (ROCEPHIN) 1 g in sodium chloride 0.9 % 100 mL IVPB, 1 g, Intravenous, Q24H, Regalado, Belkys A, MD, Last Rate: 200 mL/hr at 05/02/21 0946, 1 g at 05/02/21 0946   darifenacin (ENABLEX) 24 hr tablet 7.5 mg, 7.5 mg, Oral, Daily, [place], Ping T, MD, 7.5 mg at 05/02/21 4132   hydrALAZINE (APRESOLINE) tablet 25 mg, 25 mg, Oral, Q6H PRN, Etta Quill, DO, 25 mg at 04/30/21 2012   isosorbide mononitrate (IMDUR) 24 hr tablet 30 mg, 30 mg, Oral, Daily, Vance Gather B, MD, 30 mg at 05/02/21 0814   levETIRAcetam (KEPPRA) tablet 500 mg, 500 mg, Oral, BID, Donnetta Simpers, MD, 500 mg at 05/02/21 4401   levothyroxine (SYNTHROID) tablet 25 mcg,  25 mcg, Oral, Q0600, Wynetta Fines T, MD, 25 mcg at 05/02/21 0272   loperamide (IMODIUM) capsule 2 mg, 2 mg, Oral, PRN, Vance Gather B, MD, 2 mg at 04/26/21 1339   LORazepam (ATIVAN) tablet 0.5 mg, 0.5 mg, Oral, Q4H PRN, Wynetta Fines T, MD   metoprolol tartrate (LOPRESSOR) injection 5 mg, 5 mg, Intravenous, Q5 min PRN, Alcario Drought, Jared M, DO, 5 mg at 04/30/21 0157   polyethylene glycol (MIRALAX / GLYCOLAX) packet 17 g, 17 g, Oral, BID, Regalado, Belkys A, MD, 17 g at 05/02/21 0813   senna (SENOKOT) tablet 8.6 mg, 1 tablet, Oral, Daily, Regalado, Belkys A, MD, 8.6 mg at 05/02/21 5366  Patients Current Diet:  Diet Order             Diet Heart Room service appropriate? Yes with Assist; Fluid consistency: Thin  Diet effective now                   Precautions / Restrictions Precautions Precautions: Fall Precaution Comments: pt with sever R sided neglect Restrictions Weight Bearing Restrictions: No Other Position/Activity Restrictions: L hip pain  from OA   Has the patient had 2 or more falls or a fall with injury in the past year? No  Prior Activity Level    Prior Functional Level Self Care: Did the patient need help bathing, dressing, using the toilet or eating? Independent  Indoor Mobility: Did the patient need assistance with walking from room to room (with or without device)? Independent  Stairs: Did the patient need assistance with internal or external stairs (with or without device)? Independent  Functional Cognition: Did the patient need help planning regular tasks such as shopping or remembering to take medications? Independent  Patient Information Are you of Hispanic, Latino/a,or Spanish origin?: A. No, not of Hispanic, Latino/a, or Spanish origin What is your race?: B. Black or African American Do you need or want an interpreter to communicate with a doctor or health care staff?: 0. No  Patient's Response To:  Health Literacy and Transportation Is the patient able to  respond to health literacy and transportation needs?: Yes Health Literacy - How often do you need to have someone help you when you read instructions, pamphlets, or other written material from your doctor or pharmacy?: Never In the past 12 months, has lack of transportation kept you from medical appointments or from getting medications?: No In the past 12 months, has lack of transportation kept you from meetings, work, or from getting things needed for daily living?: No  Development worker, international aid / [place] Devices/Equipment: None Home Equipment: Cane - single point, Conservation officer, nature (2 wheels)  Prior Device Use: Indicate devices/aids used by the patient prior to current illness, exacerbation or injury? Walker  Current Functional Level Cognition  Arousal/Alertness: Awake/alert Overall Cognitive Status: Impaired/Different from baseline Current Attention Level: Sustained Orientation Level: Oriented to person, Oriented to place Following Commands: Follows one step commands inconsistently, Follows one step commands with increased time Safety/Judgement: Decreased awareness of safety, Decreased awareness of deficits General Comments: Patient  continues to demonstrate severe right neglect and requires verbal and tactile cues to tend to R UE and LE, unable to track past midline to the R Attention: Focused Focused Attention: Appears intact    Extremity Assessment (includes Sensation/Coordination)  Upper Extremity Assessment: RUE deficits/detail RUE Deficits / Details: difficulty with RUE movements on commands, attempts to use with functional tasks RUE Sensation: decreased light touch LUE Deficits / Details: Pt not moving LUE on command, however seen completing elbow flexion, small shoulder flexion, and abduction movements. LUE Sensation: decreased proprioception, decreased light touch LUE Coordination: decreased fine motor, decreased gross motor  Lower Extremity Assessment: Defer to  PT evaluation    ADLs  Overall ADL's : Needs assistance/impaired Grooming: Wash/dry hands, Minimal assistance, Moderate assistance Grooming Details (indicate cue type and reason): performed face washing on EOB with LUE and min verbal cues, mod assist and assistance with LUE to use RUE.  Attempted to use RUE while standing at sink with max assist Upper Body Bathing: Moderate assistance, Sitting Upper Body Bathing Details (indicate cue type and reason): assistance for back and to LUE Lower Body Bathing: Maximal assistance, Sit to/from stand Lower Body Bathing Details (indicate cue type and reason): patient able to assist with bathing legs seated and max assist when standing in Stedy Upper Body Dressing : Maximal assistance Upper Body Dressing Details (indicate cue type and reason): donned gown seated on EOB General ADL Comments: grooming performed with attempts to increase RUE use and right visual field scanning    Mobility  Overal bed mobility: Needs Assistance  Bed Mobility: Supine to Sit Supine to sit: HOB elevated, Max assist, +2 for physical assistance Sit to supine: Mod assist General bed mobility comments: requires assistance with BLEs and trunk elevation with verbal cues to use LUE to assist    Transfers  Overall transfer level: Needs assistance Equipment used: 2 person hand held assist (with use of bed pad and gait belt with face to face transfer) Transfers: Sit to/from Stand, Bed to chair/wheelchair/BSC Sit to Stand: Mod assist, +2 physical assistance Bed to/from chair/wheelchair/BSC transfer type:: Step pivot Step pivot transfers: Mod assist, Max assist, +2 physical assistance Transfer via Lift Equipment: Stedy General transfer comment: Assist of 2 to power up and weight shift to perform step pivot transfer into recliner    Ambulation / Gait / Stairs / Wheelchair Mobility  Ambulation/Gait General Gait Details: pt unable at this time    Posture / Balance Dynamic Sitting  Balance Sitting balance - Comments: min guard for sitting balance onEOB while performing grooming tasks and reaching task to increase attention to right Balance Overall balance assessment: Needs assistance Sitting-balance support: Feet supported, Single extremity supported Sitting balance-Leahy Scale: Fair Sitting balance - Comments: min guard for sitting balance onEOB while performing grooming tasks and reaching task to increase attention to right Standing balance support: Bilateral upper extremity supported, During functional activity, Reliant on assistive device for balance Standing balance-Leahy Scale: Poor Standing balance comment: Stood at sink with max assist of 2    Special needs/care consideration IV yes IV ABX and Diabetic management Yes h/o DM   Previous Home Environment (from acute therapy documentation) Living Arrangements: Children  Lives With: Family Available Help at Discharge: Family, Available 24 hours/day Type of Home: House Home Layout: One level Home Access: Level entry Bathroom Shower/Tub: Engineer, manufacturing systems: Standard Home Care Services: No Additional Comments: has been walking unassisted at times and at times with AD  Discharge Living Setting Plans for Discharge Living Setting: Patient's home Type of Home at Discharge: House Discharge Home Layout: One level Discharge Home Access: Level entry Discharge Bathroom Shower/Tub: Tub/shower unit Discharge Bathroom Toilet: Standard Discharge Bathroom Accessibility: Yes How Accessible: Accessible via walker Does the patient have any problems obtaining your medications?: No  Social/Family/Support Systems Patient Roles: Other (Comment) Contact Information: [phone] Anticipated Caregiver: Bennie Pierini Anticipated Caregiver's Contact Information: [phone] Ability/Limitations of Caregiver: Min A Caregiver Availability: 24/7 (24/7 (lives with daughter who works, other son and daughter to rotate  days while she works)) Discharge Plan Discussed with Primary Caregiver: Yes Is Caregiver In Agreement with Plan?: Yes Does Caregiver/Family have Issues with Lodging/Transportation while Pt is in Rehab?: No  Goals Patient/Family Goal for Rehab: PT/OT/SLP Supervision Expected length of stay: 14-16 days Pt/Family Agrees to Admission and willing to participate: Yes Program Orientation Provided & Reviewed with Pt/Caregiver Including Roles  & Responsibilities: Yes  Decrease burden of Care through IP rehab admission: Specialzed equipment needs, Decrease number of caregivers, Bowel and bladder program, and Patient/family education  Possible need for SNF placement upon discharge: not anticipated   Patient Condition: I have reviewed medical records from [facility] , spoken with CM, and patient. I met with patient at the bedside for inpatient rehabilitation assessment.  Patient will benefit from ongoing PT, OT, and SLP, can actively participate in 3 hours of therapy a day 5 days of the week, and can make measurable gains during the admission.  Patient will also benefit from the coordinated team approach during an Inpatient Acute Rehabilitation admission.  The patient will receive intensive therapy as well as Rehabilitation physician, nursing, social worker, and care management interventions.  Due to safety, skin/wound care, disease management, medication administration, pain management, and patient education the patient requires 24 hour a day rehabilitation nursing.  The patient is currently mod A-mod+2 with mobility and basic ADLs.  Discharge setting and therapy post discharge at home with home health is anticipated.  Patient has agreed to participate in the Acute Inpatient Rehabilitation Program and will admit today.  Preadmission Screen Completed By:  Retta Diones, 05/02/2021 10:02 AM ______________________________________________________________________   Discussed status with Dr.  Dagoberto Ligas on 05/02/21 at 0930 and received approval for admission today.  Admission Coordinator:  Retta Diones, RN, time 1010/Date 05/02/21   Assessment/Plan: Diagnosis: Does the need for close, 24 hr/day Medical supervision in concert with the patient's rehab needs make it unreasonable for this patient to be served in a less intensive setting? Yes Co-Morbidities requiring supervision/potential complications: CKD IV; pAfib embolic L thalamic strokes; seizure-s local- on keppra; on Eliquis; HTN; DM- A1c 4.5 Due to bladder management, bowel management, safety, skin/wound care, disease management, medication administration, pain management, and patient education, does the patient require 24 hr/day rehab nursing? Yes Does the patient require coordinated care of a physician, rehab nurse, PT, OT, and SLP to address physical and functional deficits in the context of the above medical diagnosis(es)? Yes Addressing deficits in the following areas: balance, endurance, locomotion, strength, transferring, bowel/bladder control, bathing, dressing, feeding, grooming, toileting, and swallowing Can the patient actively participate in an intensive therapy program of at least 3 hrs of therapy 5 days a week? Yes The potential for patient to make measurable gains while on inpatient rehab is good Anticipated functional outcomes upon discharge from inpatient rehab: supervision PT, supervision OT, supervision SLP Estimated rehab length of stay to reach the above functional goals is: 14-16 days Anticipated discharge destination: Home 10. Overall Rehab/Functional Prognosis: good   MD Signature:

## 2021-04-29 NOTE — Progress Notes (Signed)
Physical Therapy Treatment Patient Details Name: Rita Yoder MRN: [id number] DOB: 03/17/1940 Today's Date: 04/29/2021   History of Present Illness 81 y/o female presenting on 2/14 with R sided numbness. MRI with small patchy acute cortical/subcortical infarcts in L parietal lobe. Noted worsening dysarthria and twitching of R hand 2/15- plan for EEG.  PMH includes: CKD, HTN.    PT Comments    Pt remains to have severe R sided neglect limiting ability to use R UE and LE functionally. Pt requiring max verbal cues to try to turn head to R and track with eyes, pt stops at midline. Educated granddaughter on sitting on her R side and to have pt find items on her tray table that are placed on the right side to continue to increase awareness to R side. Pt modAx2 to stand in stedy, remains the safety to transfer in chair with stedy. Continue to recommend AIR upon d/c for maximal functional recovery and to decrease burden of care on family who can provide 24/7 assist.   Recommendations for follow up therapy are one component of a multi-disciplinary discharge planning process, led by the attending physician.  Recommendations may be updated based on patient status, additional functional criteria and insurance authorization.  Follow Up Recommendations  Acute inpatient rehab (3hours/day)     Assistance Recommended at Discharge Frequent or constant Supervision/Assistance  Patient can return home with the following Two people to help with walking and/or transfers;A lot of help with bathing/dressing/bathroom;Assistance with cooking/housework;Assist for transportation;Help with stairs or ramp for entrance   Equipment Recommendations  Rolling walker (2 wheels)    Recommendations for Other Services Rehab consult     Precautions / Restrictions Precautions Precautions: Fall Precaution Comments: pt with sever R sided neglect Restrictions Weight Bearing Restrictions: No     Mobility  Bed  Mobility Overal bed mobility: Needs Assistance Bed Mobility: Supine to Sit     Supine to sit: HOB elevated, Mod assist     General bed mobility comments: max verbal cues to tend to R UE and LE, once patient given tactile cues to thigh and UE pt would begin to move them however doesn't initiate movement of R UE or LE.    Transfers Overall transfer level: Needs assistance Equipment used:  (stedy) Transfers: Sit to/from Stand, Bed to chair/wheelchair/BSC Sit to Stand: Mod assist, +2 physical assistance, From elevated surface, Min assist           General transfer comment: pt initiates well with hand over hand assist with R UE on stedy, pt powers up however requires modAx2/maxA to maintain standing for hygiene due to being soiled with urine. completed 3 sit to stands Transfer via Lift Equipment: Stedy  Ambulation/Gait               General Gait Details: pt unable at this time   Marine scientist Rankin (Stroke Patients Only) Modified Rankin (Stroke Patients Only) Pre-Morbid Rankin Score: Moderate disability Modified Rankin: Severe disability     Balance Overall balance assessment: Needs assistance Sitting-balance support: Feet supported, Single extremity supported Sitting balance-Leahy Scale: Fair Sitting balance - Comments: can sit for extended period of time at EOB on her own with guard assist and cues .   Standing balance support: Bilateral upper extremity supported, During functional activity, Reliant on assistive device for balance Standing balance-Leahy Scale: Poor Standing balance comment: Relies on Support of Stedy and  external support to stand                            Cognition Arousal/Alertness: Awake/alert Behavior During Therapy: Flat affect Overall Cognitive Status: Impaired/Different from baseline (due to aphasia) Area of Impairment: Problem solving, Awareness, Following commands, Attention,  Orientation, Safety/judgement                 Orientation Level: Situation, Time Current Attention Level: Sustained (difficulty tending to R side despite max verbal cues)   Following Commands: Follows one step commands inconsistently, Follows one step commands with increased time Safety/Judgement: Decreased awareness of safety, Decreased awareness of deficits (R sided neglect) Awareness: Intellectual Problem Solving: Slow processing, Requires verbal cues, Requires tactile cues, Difficulty sequencing, Decreased initiation General Comments: Pt with visual impairment with double vision. Pt with inattention to Right hemibody with difficulty getting pt to cross midline with eyes, able to turn head to the R but very difficulty to have eyes follow        Exercises      General Comments General comments (skin integrity, edema, etc.): pt dependent for pericare      Pertinent Vitals/Pain Pain Assessment Pain Assessment: No/denies pain    Home Living Family/patient expects to be discharged to:: Private residence Living Arrangements: Children Available Help at Discharge: Family;Available 24 hours/day Type of Home: House Home Access: Level entry       Home Layout: One level Home Equipment: Cane - single Barista (2 wheels) Additional Comments: has been walking unassisted at times and at times with AD    Prior Function            PT Goals (current goals can now be found in the care plan section) Acute Rehab PT Goals Patient Stated Goal: none stated PT Goal Formulation: With patient Time For Goal Achievement: 05/08/21 Potential to Achieve Goals: Good Progress towards PT goals: Progressing toward goals    Frequency    Min 4X/week      PT Plan Current plan remains appropriate    Co-evaluation PT/OT/SLP Co-Evaluation/Treatment: Yes Reason for Co-Treatment: To address functional/ADL transfers PT goals addressed during session: Mobility/safety with  mobility        AM-PAC PT "6 Clicks" Mobility   Outcome Measure  Help needed turning from your back to your side while in a flat bed without using bedrails?: A Lot Help needed moving from lying on your back to sitting on the side of a flat bed without using bedrails?: A Lot Help needed moving to and from a bed to a chair (including a wheelchair)?: Total Help needed standing up from a chair using your arms (e.g., wheelchair or bedside chair)?: Total Help needed to walk in hospital room?: Total Help needed climbing 3-5 steps with a railing? : Total 6 Click Score: 8    End of Session Equipment Utilized During Treatment: Gait belt Activity Tolerance: Patient limited by fatigue Patient left: with call bell/phone within reach;with family/visitor present;in chair Nurse Communication: Mobility status;Need for lift equipment (Stedy, soiled in urine, bed changed, need for new purwick) PT Visit Diagnosis: Muscle weakness (generalized) (M62.81);Difficulty in walking, not elsewhere classified (R26.2)     Time: 8329-1916 PT Time Calculation (min) (ACUTE ONLY): 30 min  Charges:  $Therapeutic Activity: 8-22 mins                     Kittie Plater, PT, DPT Acute Rehabilitation Services Pager #: [phone] Office #:  [place] 04/29/2021, 8:52 AM

## 2021-04-29 NOTE — Progress Notes (Signed)
Inpatient Rehab Admissions Coordinator:   I do not have a bed for insurance auth for CIR this morning. Peer to peer completed this AM and updated therapy note sent. I will follow for potential admit pending insurance auth and bed availability.  Clemens Catholic, [place], [place] Admissions Coordinator  [phone] ([place]) [phone] (office)

## 2021-04-29 NOTE — Consult Note (Signed)
° °  [facility] Inpatient Consult   04/29/2021  Rita Yoder Jan 21, 1941 [id number]  [place] [facility] [ACO] Patient: [employer] Medicare  Primary Care Provider:  Glendale Chard, MD, [facility] is an embedded provider with a Chronic Care Management team and program, and is listed for the transition of care follow up and appointments.  Patient was screened for Embedded practice service needs for chronic care management for transition of care and progress notes reveals patient/family is pursing an inpatient rehab level of care for transition.  Plan: Follow for progress.  Please contact for further questions,     Natividad Brood, RN BSN [facility] Liaison  [phone] business mobile phone Toll free office [phone]  Fax number: [phone] [email or web address]

## 2021-04-29 NOTE — Progress Notes (Signed)
Occupational Therapy Treatment Patient Details Name: Rita Yoder MRN: [id number] DOB: 05-Oct-1940 Today's Date: 04/29/2021   History of present illness 81 y/o female presenting on 2/14 with R sided numbness. MRI with small patchy acute cortical/subcortical infarcts in L parietal lobe. Noted worsening dysarthria and twitching of R hand 2/15- plan for EEG.  PMH includes: CKD, HTN.   OT comments  Patient seen for PT/OT session for bed mobility, EOB sitting balance, self care sitting on EOB, and transfer to recliner with Stedy. Patient continues to demonstrate right neglect but was able to attend during functional tasks and attempted to use RUE during bathing. Patient continues to be a good candidate for AIR. Acute OT to continue to follow.    Recommendations for follow up therapy are one component of a multi-disciplinary discharge planning process, led by the attending physician.  Recommendations may be updated based on patient status, additional functional criteria and insurance authorization.    Follow Up Recommendations  Acute inpatient rehab (3hours/day)    Assistance Recommended at Discharge Frequent or constant Supervision/Assistance  Patient can return home with the following  Two people to help with walking and/or transfers;Two people to help with bathing/dressing/bathroom;Assistance with cooking/housework;Assistance with feeding;Direct supervision/assist for medications management;Direct supervision/assist for financial management;Help with stairs or ramp for entrance   Equipment Recommendations  Other (comment) (TBD)    Recommendations for Other Services      Precautions / Restrictions Precautions Precautions: Fall Precaution Comments: pt with sever R sided neglect Restrictions Weight Bearing Restrictions: No       Mobility Bed Mobility                    Transfers                         Balance                                            ADL either performed or assessed with clinical judgement   ADL Overall ADL's : Needs assistance/impaired     Grooming: Wash/dry hands;Wash/dry face;Minimal assistance;Sitting Grooming Details (indicate cue type and reason): performed seated on EOB Upper Body Bathing: Moderate assistance;Sitting Upper Body Bathing Details (indicate cue type and reason): assistance for back and to LUE Lower Body Bathing: Maximal assistance;Sit to/from stand Lower Body Bathing Details (indicate cue type and reason): patient able to assist with bathing legs seated and max assist when standing in Stedy Upper Body Dressing : Maximal assistance Upper Body Dressing Details (indicate cue type and reason): donned gown seated on EOB                   General ADL Comments: bathing and dressing performed seated on EOB with patient standing in Stedy for peri area cleaning    Extremity/Trunk Assessment Upper Extremity Assessment LUE Deficits / Details: Pt not moving LUE on command, however seen completing elbow flexion, small shoulder flexion, and abduction movements. LUE Sensation: decreased proprioception;decreased light touch LUE Coordination: decreased fine motor;decreased gross motor       Cervical / Trunk Assessment Cervical / Trunk Assessment: Kyphotic    Vision   Eye Alignment: Within Functional Limits Ocular Range of Motion: Restricted on the left;Restricted looking up;Restricted looking down Alignment/Gaze Preference: Gaze right Tracking/Visual Pursuits: Left eye does not track laterally;Left eye does not track medially;Requires cues,  head turns, or add eye shifts to track Saccades: Impaired - to be further tested in functional context Visual Fields: Left visual field deficit;Impaired-to be further tested in functional context Depth Perception: Overshoots Additional Comments: verbal cues to attend to right   Perception     Praxis      Cognition                                                 Exercises      Shoulder Instructions       General Comments pt dependent for pericare    Pertinent Vitals/ Pain          Home Living Family/patient expects to be discharged to:: Private residence Living Arrangements: Children Available Help at Discharge: Family;Available 24 hours/day Type of Home: House Home Access: Level entry     Home Layout: One level     Bathroom Shower/Tub: Teacher, early years/pre: Standard     Home Equipment: Cane - single Barista (2 wheels)   Additional Comments: has been walking unassisted at times and at times with AD  Lives With: Family    Prior Functioning/Environment              Frequency  Min 2X/week        Progress Toward Goals  OT Goals(current goals can now be found in the care plan section)  Progress towards OT goals: Progressing toward goals  Acute Rehab OT Goals Patient Stated Goal: go to rehab OT Goal Formulation: With patient/family Time For Goal Achievement: 05/09/21 Potential to Achieve Goals: Good ADL Goals Pt Will Perform Grooming: with mod assist;sitting Pt Will Perform Upper Body Bathing: with mod assist;sitting Pt Will Perform Lower Body Bathing: with max assist;sitting/lateral leans;sit to/from stand Pt Will Perform Upper Body Dressing: with mod assist;sitting Pt Will Perform Lower Body Dressing: with max assist;sitting/lateral leans;sit to/from stand Pt Will Transfer to Toilet: with max assist;stand pivot transfer Pt Will Perform Toileting - Clothing Manipulation and hygiene: with max assist;sitting/lateral leans;sit to/from stand  Plan Discharge plan remains appropriate    Co-evaluation    PT/OT/SLP Co-Evaluation/Treatment: Yes Reason for Co-Treatment: To address functional/ADL transfers PT goals addressed during session: Mobility/safety with mobility OT goals addressed during session: ADL's and self-care      AM-PAC OT "6  Clicks" Daily Activity     Outcome Measure   Help from another person eating meals?: A Lot Help from another person taking care of personal grooming?: A Little Help from another person toileting, which includes using toliet, bedpan, or urinal?: A Lot Help from another person bathing (including washing, rinsing, drying)?: A Lot Help from another person to put on and taking off regular upper body clothing?: A Lot Help from another person to put on and taking off regular lower body clothing?: A Lot 6 Click Score: 13    End of Session Equipment Utilized During Treatment: Gait belt;Other (comment) Charlaine Dalton)  OT Visit Diagnosis: Unsteadiness on feet (R26.81);Other abnormalities of gait and mobility (R26.89);Muscle weakness (generalized) (M62.81);Low vision, both eyes (H54.2);Hemiplegia and hemiparesis Hemiplegia - Right/Left: Left Hemiplegia - dominant/non-dominant: Non-Dominant Hemiplegia - caused by: Cerebral infarction   Activity Tolerance Patient tolerated treatment well   Patient Left in chair;with call bell/phone within reach;with family/visitor present   Nurse Communication Mobility status;Need for lift equipment  Time: 0370-4888 OT Time Calculation (min): 30 min  Charges: OT General Charges $OT Visit: 1 Visit OT Treatments $Self Care/Home Management : 8-22 mins  Lodema Hong, [place]  Pager [phone] Office [place] 04/29/2021, 11:44 AM

## 2021-04-29 NOTE — TOC Benefit Eligibility Note (Signed)
Patient Teacher, English as a foreign language completed.    The patient is currently admitted and upon discharge could be taking Eliquis 2.5 mg.  The current 30 day co-pay is, $47.00.   The patient is insured through [place], [place] Patient Advocate Specialist [place] Patient Advocate Team Direct Number: [phone]  Fax: [phone]

## 2021-04-29 NOTE — Progress Notes (Signed)
Progress Note  Patient: Rita Yoder MLY:[id number] DOB: 12-Feb-1941  DOA: 04/23/2021  DOS: 04/29/2021    Brief hospital course: Rita Yoder is a 81 y.o. female with medical history significant of HTN, IIDM, HLD, stage IIIb, normocytic anemia, came with new onset of right arm and hand numbness tingling.  Symptoms started around 1 PM, she denies any weakness of any of the limbs.  According to daughter, patient was briefly not talking and gaze to the right side but resolved soon.  At that time patient arrived in the ED, most of her symptoms of right-sided numbness has resolved.  Denies any headache or blurry vision.  She used to take aspirin which was discontinued by PCP several years ago.  Daughter reported patient blood pressure has been fairly controlled at home SBP= 140s.   ED Course: Blood pressure elevated SBP>160.  Creatinine 3.1, bicarb 20, BUN 49, glucose 160 hemoglobin 9.7 compared to baseline 10.3.   CT had hypodensity of left thalamus suspicious for acute/subacute stroke. MRI revealed left sided patchy infarcts suggestive of embolic source, small vessel ischemic changes, and possible SAH along the right parieto-occipital lobe. Due to concern for microhemorrhages, DAPT not recommended, and the patient was started on aspirin alone. Due to right hand twitching and concern for focal seizure, keppra was loaded, with resolution of tremor. EEG not performed due to glued wig in place. due to her deficits, acute inpatient rehabilitation was recommended, though CIR recommends SNF placement due to pt's inability to tolerate intensity of CIR program. This is being pursued.    Assessment and Plan: Acute multifocal strokes including left ischemic thalamus stroke and left parietal cortical and subcortical infarcts with concomitant right-sided weakness.  - Given detection of atrial fibrillation on 2/17 - 2/18, started DOAC and DC DAPT.   - LDL 78, slightly above goal of 70, augmented atorvastatin  20mg  > 40mg . - Echo without IAS or CES. - PT recommends CIR at discharge. Peer to peer with [facility], Dr. Alycia Patten, performed by me this morning. New PT notes to be sent.   New diagnosis paroxysmal atrial fibrillation: Back in sinus rhythm quickly. - Started DOAC, eliquis preferred with lower risk of GI and IC hemorrhage.  - Avoiding AV nodal agents at this time with baseline sinus bradycardia.  Suspicion for focal seizure:  - Continue keppra. Pt's wig is not able to be removed for EEG.    HTN:  - Restart imdur 2/19. Note bradycardia would limit use of AV nodal agents, and CrCl would currently suggest diminished effectiveness of thiazide.   T2DM: HbA1c 4.5%.  - SSI stopped due to hypoglycemia. Encourage frequent meals   CKD stage IIIb: CrCl ~98ml/min. - Euvolemic, creatinine level stable/improved from admission to baseline.  No acute electrolyte or acid-base abnormalities. Holding HCTZ.   Anemia of CKD: Stable - Outpatient follow-up with PCP and nephrology.  Mildly elevated TSH: Not diagnostic in acute setting. - Recheck after discharge.   Obesity: Estimated body mass index is 40.49 kg/m as calculated from the following:   Height as of 03/25/21: 5\' 1"  (1.549 m).   Weight as of 03/25/21: 97.2 kg.  Obesity: Estimated body mass index is 33.84 kg/m as calculated from the following:   Height as of this encounter: 5\' 6"  (1.676 m).   Weight as of this encounter: 95.1 kg.  Subjective: No hypoglycemia, no bleeding, denies chest pain, dyspnea, palpitations. Working with PT at time of encounter this AM.  Objective: Vitals:   04/29/21 0002 04/29/21 8127  04/29/21 0737 04/29/21 1130  BP: (!) 187/73 (!) 195/65 (!) 192/60 (!) 153/64  Pulse: 65 60 60 65  Resp: 18 16 18 18   Temp: 98.9 F (37.2 C)  98.5 F (36.9 C) 98.6 F (37 C)  TempSrc: Oral  Oral Oral  SpO2: 99% 100% 100% 100%  Weight:      Height:      Gen: 81 y.o. female in no distress Pulm: Nonlabored breathing room air.  Clear. CV: Regular rate and rhythm. No murmur, rub, or gallop. No JVD, no pitting dependent edema. GI: Abdomen soft, non-tender, non-distended, with normoactive bowel sounds.  Ext: Warm, no deformities Skin: No new rashes, lesions or ulcers on visualized skin. Neuro: Alert and oriented. Right-side neglect is stable, no new focal neurological deficits. Psych: Judgement and insight appear fair. Mood euthymic & affect congruent. Behavior is appropriate.    Telemetry (personal review): Sinus rhythm/bradycardia, did have atrial fibrillation 2/18.  Data Personally reviewed: WBC 9.7k, hgb 9.7 > 9.9. Plt 176.  Creatinine 3.13 > 3.30.  LFTs wnl HDL 34, LDL 78 TSH 5.275.  MR brain 2/14: Patchy acute cortical/subcortical left parietal lobe infarcts on background moderate-severe chronic small vessel ischemic disease. Hemosiderin and chronic microhemorrhages within/along right parietooccipital lobe suggest remote [facility] and may reflect cerebral amyloid angiopathy.  Family Communication: Granddaughter at bedside  Disposition: Status is: Inpatient Remains inpatient appropriate because: unsafe DC. MEDICALLY STABLE FOR DISCHARGE. Planned Discharge Destination: Skilled nursing facility vs. CIR (awaiting insurance authorization s/p peer to peer 2/20, and bed availability)  Patrecia Pour, MD 04/29/2021 11:33 AM Page by [email or web address]

## 2021-04-30 LAB — GLUCOSE, CAPILLARY
Glucose-Capillary: 110 mg/dL — ABNORMAL HIGH (ref 70–99)
Glucose-Capillary: 121 mg/dL — ABNORMAL HIGH (ref 70–99)
Glucose-Capillary: 125 mg/dL — ABNORMAL HIGH (ref 70–99)
Glucose-Capillary: 127 mg/dL — ABNORMAL HIGH (ref 70–99)
Glucose-Capillary: 108 mg/dL — ABNORMAL HIGH (ref 70–99)

## 2021-04-30 MED ORDER — AMLODIPINE BESYLATE 5 MG PO TABS
5.0000 mg | ORAL_TABLET | Freq: Every day | ORAL | Status: DC
  Administered 2021-04-30 – 2021-05-02 (×3): 5 mg via ORAL
  Filled 2021-04-30 (×3): qty 1

## 2021-04-30 MED ORDER — AMIODARONE HCL 200 MG PO TABS
200.0000 mg | ORAL_TABLET | Freq: Every day | ORAL | Status: DC
  Administered 2021-04-30 – 2021-05-01 (×2): 200 mg via ORAL
  Filled 2021-04-30 (×2): qty 1

## 2021-04-30 MED ORDER — HYDRALAZINE HCL 25 MG PO TABS
25.0000 mg | ORAL_TABLET | Freq: Four times a day (QID) | ORAL | Status: DC | PRN
  Administered 2021-04-30: 25 mg via ORAL
  Filled 2021-04-30: qty 1

## 2021-04-30 MED ORDER — METOPROLOL TARTRATE 5 MG/5ML IV SOLN
5.0000 mg | INTRAVENOUS | Status: DC | PRN
  Administered 2021-04-30: 5 mg via INTRAVENOUS
  Filled 2021-04-30: qty 5

## 2021-04-30 MED ORDER — HYDRALAZINE HCL 25 MG PO TABS: 25.0000 mg | ORAL_TABLET | Freq: Four times a day (QID) | ORAL | Status: DC | PRN

## 2021-04-30 NOTE — Progress Notes (Signed)
Progress Note  Patient: Rita Yoder DDU:[id number] DOB: 1941/01/23  DOA: 04/23/2021  DOS: 04/30/2021    Brief hospital course: Rita Yoder is a 81 y.o. female with medical history significant of HTN, IIDM, HLD, stage IIIb, normocytic anemia, came with new onset of right arm and hand numbness tingling.  Admitted with acute stroke.     CT had hypodensity of left thalamus suspicious for acute/subacute stroke. MRI revealed left sided patchy infarcts suggestive of embolic source, small vessel ischemic changes, and possible SAH along the right parieto-occipital lobe. Due to concern for microhemorrhages, DAPT not recommended, and the patient was started on aspirin alone. Due to right hand twitching and concern for focal seizure, keppra was loaded, with resolution of tremor. EEG not performed due to glued wig in place.   She develops Paroxysmal A fib, Dr Bonner Puna discussed with neurology, patient was started on eliquis.   Assessment and Plan: Acute multifocal strokes including left ischemic thalamus stroke and left parietal cortical and subcortical infarcts with concomitant right-sided weakness.  - Given detection of atrial fibrillation on 2/17 - 2/18, started DOAC and DC DAPT.   - LDL 78, slightly above goal of 70, augmented atorvastatin 20mg  > 40mg . - Echo without IAS or CES. - PT recommends CIR at discharge. Peer to peer denied, undergoing appeal process.    New diagnosis paroxysmal atrial fibrillation: Back in sinus rhythm quickly. - Started DOAC, eliquis preferred with lower risk of GI and IC hemorrhage.  - Avoiding AV nodal agents at this time with baseline sinus bradycardia. -develops ARV last night. Dr Terrence Dupont consulted. Plan to start Amiodarone.   Suspicion for focal seizure:  - Continue keppra. Pt's wig is not able to be removed for EEG.    HTN:  - Continue with Imdur.    T2DM: HbA1c 4.5%.  - SSI stopped due to hypoglycemia. Encourage frequent meals   CKD stage IIIb: CrCl  ~79ml/min. - Euvolemic, creatinine level stable/improved from admission to baseline.  Holding HCTZ.  -Prior Cr 2.8  Anemia of CKD: Stable - Outpatient follow-up with PCP and nephrology.  Mildly elevated TSH: Not diagnostic in acute setting. - Recheck after discharge.   Obesity: Estimated body mass index is 40.49 kg/m as calculated from the following:   Height as of 03/25/21: 5\' 1"  (1.549 m).   Weight as of 03/25/21: 97.2 kg.  Obesity: Estimated body mass index is 33.84 kg/m as calculated from the following:   Height as of this encounter: 5\' 6"  (1.676 m).   Weight as of this encounter: 95.1 kg.  Subjective:  She is alert, granddaughter at bedside. She was able to recognize granddaughter. Denies pain.   Objective: Vitals:   04/29/21 2300 04/30/21 0319 04/30/21 0832 04/30/21 1114  BP: (!) 182/63 (!) 140/53 (!) 142/60 (!) 156/57  Pulse: 67 63 96 63  Resp: 18 17 18 20   Temp: 97.9 F (36.6 C) 98.3 F (36.8 C) 98.8 F (37.1 C) 98.7 F (37.1 C)  TempSrc: Oral Axillary Axillary Oral  SpO2: 100% 100% 99% 99%  Weight:      Height:      Gen: 81 y.o. female NAD Pulm: CTA CV: S 1, S 2 RRR GI: BS present, sfot, nt Neuro: Alert and oriented. Right side neglect.    Telemetry (personal review): Sinus rhythm/bradycardia, did have atrial fibrillation 2/18. RVR 2/21   MR brain 2/14: Patchy acute cortical/subcortical left parietal lobe infarcts on background moderate-severe chronic small vessel ischemic disease. Hemosiderin and chronic microhemorrhages within/along right  parietooccipital lobe suggest remote [facility] and may reflect cerebral amyloid angiopathy.  Family Communication: Granddaughter at bedside  Disposition: Status is: Inpatient Remains inpatient appropriate because: unsafe DC. MEDICALLY STABLE FOR DISCHARGE. Planned Discharge Destination: Home vs. CIR (awaiting appeal)  Rita Shiley, MD 04/30/2021 1:43 PM Page by [email or web address]

## 2021-04-30 NOTE — Progress Notes (Signed)
Physical Therapy Treatment Patient Details Name: Rita Yoder MRN: [id number] DOB: [birth year] Today's Date: 04/30/2021   History of Present Illness 81 y/o female presenting on 2/14 with R sided numbness. MRI with small patchy acute cortical/subcortical infarcts in L parietal lobe. Noted worsening dysarthria and twitching of R hand 2/15- plan for EEG.  PMH includes: CKD, HTN.    PT Comments    Pt continues with severe R sided neglect requiring max tactile and verbal cues to tend to R side. Pt did complete sit to stand and then step pvt to chair with mod/maxAX2 today. Pt with no R LE buckling and was able to hold pt's weight. Due to impaired proprioception pt with difficulty acknowledging R UE and LE are present to use functionally. Pt progressing well towards all goals. Pt remains appropriate for AIR upon d/c to decreased burden of care on family. Acute PT to cont to follow.   Recommendations for follow up therapy are one component of a multi-disciplinary discharge planning process, led by the attending physician.  Recommendations may be updated based on patient status, additional functional criteria and insurance authorization.  Follow Up Recommendations  Acute inpatient rehab (3hours/day)     Assistance Recommended at Discharge Frequent or constant Supervision/Assistance  Patient can return home with the following Two people to help with walking and/or transfers;A lot of help with bathing/dressing/bathroom;Assistance with cooking/housework;Assist for transportation;Help with stairs or ramp for entrance   Equipment Recommendations  Rolling walker (2 wheels)    Recommendations for Other Services Rehab consult     Precautions / Restrictions Precautions Precautions: Fall Precaution Comments: pt with sever R sided neglect Restrictions Weight Bearing Restrictions: No     Mobility  Bed Mobility Overal bed mobility: Needs Assistance Bed Mobility: Supine to Sit     Supine to  sit: HOB elevated, Max assist, +2 for physical assistance     General bed mobility comments: max verbal cues to tend to R UE and LE, once patient given tactile cues to thigh and UE pt would begin to move them however doesn't initiate movement of R UE or LE, unable to grip bed rail with R UE, maxAx2 for trunk elevation and to scoot hips to EOB    Transfers Overall transfer level: Needs assistance Equipment used: 2 person hand held assist (face to face transfer with bed pad and gait belt) Transfers: Sit to/from Stand, Bed to chair/wheelchair/BSC Sit to Stand: Mod assist, +2 physical assistance   Step pivot transfers: Mod assist, Max assist, +2 physical assistance       General transfer comment: pt powers up well. pt completed 1 stand for approx 30 sec prior to needing to sit, no R knee buckling, performed L LE marching x4 and was able to hold weight on R LE without buckling. pt maxAx2 to give tactile cues for weight shifting to complete step pvt to chair    Ambulation/Gait               General Gait Details: pt unable at this time   Stairs             Wheelchair Mobility    Modified Rankin (Stroke Patients Only) Modified Rankin (Stroke Patients Only) Pre-Morbid Rankin Score: Moderate disability Modified Rankin: Severe disability     Balance Overall balance assessment: Needs assistance Sitting-balance support: Feet supported, Single extremity supported Sitting balance-Leahy Scale: Fair Sitting balance - Comments: can sit for extended period of time at EOB on her own with guard assist and  cues .   Standing balance support: Bilateral upper extremity supported, During functional activity, Reliant on assistive device for balance Standing balance-Leahy Scale: Poor Standing balance comment: Relies on Support of Stedy and external support to stand                            Cognition Arousal/Alertness: Awake/alert Behavior During Therapy: Flat  affect Overall Cognitive Status: Impaired/Different from baseline (due to aphasia) Area of Impairment: Problem solving, Awareness, Following commands, Attention, Orientation, Safety/judgement                 Orientation Level: Situation, Time Current Attention Level: Sustained (difficulty tending to R side despite max verbal cues)   Following Commands: Follows one step commands inconsistently, Follows one step commands with increased time Safety/Judgement: Decreased awareness of safety, Decreased awareness of deficits (R sided neglect) Awareness: Intellectual Problem Solving: Slow processing, Requires verbal cues, Requires tactile cues, Difficulty sequencing, Decreased initiation General Comments: pt continues to have severe R sided neglect an inabilty to track eyes to the R past midline despite max tactile and verbal cues        Exercises General Exercises - Lower Extremity Long Arc Quad: AROM, Both, 10 reps, Seated    General Comments General comments (skin integrity, edema, etc.): VSS      Pertinent Vitals/Pain Pain Assessment Pain Assessment: No/denies pain    Home Living                          Prior Function            PT Goals (current goals can now be found in the care plan section) Acute Rehab PT Goals Patient Stated Goal: none stated PT Goal Formulation: With patient Time For Goal Achievement: 05/08/21 Potential to Achieve Goals: Good Progress towards PT goals: Progressing toward goals    Frequency    Min 4X/week      PT Plan Current plan remains appropriate    Co-evaluation              AM-PAC PT "6 Clicks" Mobility   Outcome Measure  Help needed turning from your back to your side while in a flat bed without using bedrails?: A Lot Help needed moving from lying on your back to sitting on the side of a flat bed without using bedrails?: A Lot Help needed moving to and from a bed to a chair (including a wheelchair)?:  Total Help needed standing up from a chair using your arms (e.g., wheelchair or bedside chair)?: Total Help needed to walk in hospital room?: Total Help needed climbing 3-5 steps with a railing? : Total 6 Click Score: 8    End of Session Equipment Utilized During Treatment: Gait belt Activity Tolerance: Patient limited by fatigue Patient left: with call bell/phone within reach;with family/visitor present;in chair Nurse Communication: Mobility status;Need for lift equipment (use stedy) PT Visit Diagnosis: Muscle weakness (generalized) (M62.81);Difficulty in walking, not elsewhere classified (R26.2)     Time: 9381-0175 PT Time Calculation (min) (ACUTE ONLY): 23 min  Charges:  $Therapeutic Exercise: 8-22 mins $Therapeutic Activity: 8-22 mins                     Kittie Plater, PT, DPT Acute Rehabilitation Services Pager #: [phone] Office #: [phone]    Berline Lopes 04/30/2021, 12:00 PM

## 2021-04-30 NOTE — Progress Notes (Signed)
Inpatient Rehab Admissions Coordinator:   I received a denial of our case for CIR today. Pt. And family were notified and state that they wish to appeal this decision. Appeal was filed 04/29/21 at 4pm. Expedited appeals typically take 1-2 days, but can take up to 72 hours for a decision to be rendered. I will continue to follow for potential admit pending insurance auth and bed availability.  Clemens Catholic, [place], [place] Admissions Coordinator  [phone] ([place]) [phone] (office)

## 2021-04-30 NOTE — Consult Note (Signed)
Reason for Consult:paroxysmal atrial fibrillation/bradycardia Referring Physician: [facility] hospitalist  [facility] Kuc is an 81 y.o. female.  HPI: patient is a 81 year old female with past medical history significant for nonobstructive CAD and remote past, hypertension, diabetes mellitus, hyperlipidemia, chronic kidney disease stage IV, monoclonal gammopathy,was admitted approximately one week ago because of right arm and hand numbness, tingling, associated with brief aphagia which resolved spontaneously. MRI done showed small patchy acute cortical/subcortical infarct within the left parietal lobe with moderately advanced chronic small vessel ischemic changes within the cerebral white matter and possible remote subarachnoid hemorrhage.Cardiologic consultation is called as patient was noted to have paroxysmal A. Fib with RVR last night, received IV metoprolol and subsequently had an episode of bradycardia, heart rate in 50s.  Patient denies any palpitations, dizziness or syncopal episode.  Denies any chest pain, nausea, vomiting, diaphoresis.  Patient denies any history of thyroid problems.  Patient is alert, awake, now sitting in the chair.  Denies any complaints, has remained in sinus rhythm, heart rate in 60s to 70s now.  Past Medical History:  Diagnosis Date   Chronic kidney disease 12/2009   Diabetes mellitus 2010   Dyslipidemia    Hypertension    Monoclonal gammopathy 03/2010    Past Surgical History:  Procedure Laterality Date   CATARACT EXTRACTION Bilateral 12/2020   CHOLECYSTECTOMY     HERNIA REPAIR     ROTATOR CUFF REPAIR  03/10/1992   THYROID SURGERY     TONSILLECTOMY     TOTAL ABDOMINAL HYSTERECTOMY      Family History  Problem Relation Age of Onset   Healthy Mother    Heart attack Brother    Cancer Maternal Aunt    Cancer Maternal Uncle    Heart Problems Maternal Grandmother     Social History:  reports that she has never smoked. She has never used smokeless tobacco.  She reports that she does not drink alcohol and does not use drugs.  Allergies:  Allergies  Allergen Reactions   Losartan Other (See Comments)    MD discontinued this because of decreased kidney function    Medications: I have reviewed the patient's current medications.  Results for orders placed or performed during the hospital encounter of 04/23/21 (from the past 48 hour(s))  Glucose, capillary     Status: Abnormal   Collection Time: 04/28/21  1:09 PM  Result Value Ref Range   Glucose-Capillary 119 (H) 70 - 99 mg/dL    Comment: Glucose reference range applies only to samples taken after fasting for at least 8 hours.  Glucose, capillary     Status: Abnormal   Collection Time: 04/28/21  4:13 PM  Result Value Ref Range   Glucose-Capillary 149 (H) 70 - 99 mg/dL    Comment: Glucose reference range applies only to samples taken after fasting for at least 8 hours.  Glucose, capillary     Status: Abnormal   Collection Time: 04/28/21 10:06 PM  Result Value Ref Range   Glucose-Capillary 148 (H) 70 - 99 mg/dL    Comment: Glucose reference range applies only to samples taken after fasting for at least 8 hours.  Glucose, capillary     Status: Abnormal   Collection Time: 04/29/21  6:12 AM  Result Value Ref Range   Glucose-Capillary 107 (H) 70 - 99 mg/dL    Comment: Glucose reference range applies only to samples taken after fasting for at least 8 hours.  Glucose, capillary     Status: Abnormal   Collection Time:  04/29/21 11:14 AM  Result Value Ref Range   Glucose-Capillary 153 (H) 70 - 99 mg/dL    Comment: Glucose reference range applies only to samples taken after fasting for at least 8 hours.   Comment 1 Notify RN    Comment 2 Document in Chart   Glucose, capillary     Status: Abnormal   Collection Time: 04/29/21  4:46 PM  Result Value Ref Range   Glucose-Capillary 163 (H) 70 - 99 mg/dL    Comment: Glucose reference range applies only to samples taken after fasting for at least 8  hours.   Comment 1 Notify RN    Comment 2 Document in Chart   Glucose, capillary     Status: Abnormal   Collection Time: 04/29/21  9:02 PM  Result Value Ref Range   Glucose-Capillary 111 (H) 70 - 99 mg/dL    Comment: Glucose reference range applies only to samples taken after fasting for at least 8 hours.  Glucose, capillary     Status: Abnormal   Collection Time: 04/30/21  6:06 AM  Result Value Ref Range   Glucose-Capillary 110 (H) 70 - 99 mg/dL    Comment: Glucose reference range applies only to samples taken after fasting for at least 8 hours.  Glucose, capillary     Status: Abnormal   Collection Time: 04/30/21  9:00 AM  Result Value Ref Range   Glucose-Capillary 121 (H) 70 - 99 mg/dL    Comment: Glucose reference range applies only to samples taken after fasting for at least 8 hours.   Comment 1 Notify RN    Comment 2 Document in Chart   Glucose, capillary     Status: Abnormal   Collection Time: 04/30/21 11:13 AM  Result Value Ref Range   Glucose-Capillary 125 (H) 70 - 99 mg/dL    Comment: Glucose reference range applies only to samples taken after fasting for at least 8 hours.   Comment 1 Notify RN    Comment 2 Document in Chart     No results found.  Review of Systems  Constitutional:  Negative for diaphoresis.  HENT:  Negative for sore throat.   Eyes:  Negative for pain.  Respiratory:  Negative for cough and chest tightness.   Cardiovascular:  Negative for chest pain and palpitations.  Gastrointestinal:  Negative for abdominal distention.  Genitourinary:  Negative for difficulty urinating.  Neurological:  Positive for seizures. Negative for dizziness.  Blood pressure (!) 156/57, pulse 63, temperature 98.7 F (37.1 C), temperature source Oral, resp. rate 20, height 5\' 6"  (1.676 m), weight 95.1 kg, SpO2 99 %. Physical Exam Constitutional:      Appearance: Normal appearance.  HENT:     Head: Normocephalic and atraumatic.  Eyes:     Extraocular Movements:  Extraocular movements intact.     Conjunctiva/sclera: Conjunctivae normal.     Pupils: Pupils are equal, round, and reactive to light.  Neck:     Vascular: No carotid bruit.  Cardiovascular:     Rate and Rhythm: Normal rate and regular rhythm.  Pulmonary:     Effort: Pulmonary effort is normal.     Breath sounds: Normal breath sounds.  Abdominal:     General: Abdomen is flat. Bowel sounds are normal.     Palpations: Abdomen is soft.  Musculoskeletal:     Cervical back: Normal range of motion and neck supple. No tenderness.  Skin:    General: Skin is warm.  Neurological:     Mental Status:  She is alert.    Assessment/Plan: Probably cardioembolic.  Acute multifocal strokes including left ischemic thalamic stroke and left parietal cortical and subcortical infarcts.with possible remote subarachnoid hemorrhage Recurrent Paroxysmal atrial fibrillation Chadsvasc score of 6 started on chronic anticoagulation. Hypertension. Hyperlipidemia. Diabetes mellitus.controlled by diet Chronic kidney disease stage IV. Status post questionable focal seizures. Plan Agree with present management. Will add low-dose amiodarone as per orders, hopefully that will keep her in sinus rhythm. Check TSH  Charolette Forward 04/30/2021, 12:31 PM

## 2021-05-01 LAB — BASIC METABOLIC PANEL
Anion gap: 11 (ref 5–15)
BUN: 59 mg/dL — ABNORMAL HIGH (ref 8–23)
CO2: 20 mmol/L — ABNORMAL LOW (ref 22–32)
Calcium: 8.9 mg/dL (ref 8.9–10.3)
Chloride: 102 mmol/L (ref 98–111)
Creatinine, Ser: 2.83 mg/dL — ABNORMAL HIGH (ref 0.44–1.00)
GFR, Estimated: 16 mL/min — ABNORMAL LOW (ref >60)
Glucose, Bld: 113 mg/dL — ABNORMAL HIGH (ref 70–99)
Potassium: 4.7 mmol/L (ref 3.5–5.1)
Sodium: 133 mmol/L — ABNORMAL LOW (ref 135–145)

## 2021-05-01 LAB — TSH: TSH: 5.819 u[IU]/mL — ABNORMAL HIGH (ref 0.350–4.500)

## 2021-05-01 LAB — GLUCOSE, CAPILLARY
Glucose-Capillary: 119 mg/dL — ABNORMAL HIGH (ref 70–99)
Glucose-Capillary: 115 mg/dL — ABNORMAL HIGH (ref 70–99)
Glucose-Capillary: 122 mg/dL — ABNORMAL HIGH (ref 70–99)
Glucose-Capillary: 205 mg/dL — ABNORMAL HIGH (ref 70–99)
Glucose-Capillary: 122 mg/dL — ABNORMAL HIGH (ref 70–99)

## 2021-05-01 LAB — URINALYSIS, ROUTINE W REFLEX MICROSCOPIC
Bilirubin Urine: NEGATIVE
Glucose, UA: NEGATIVE mg/dL
Ketones, ur: NEGATIVE mg/dL
Nitrite: NEGATIVE
Protein, ur: 100 mg/dL — AB
Specific Gravity, Urine: 1.013 (ref 1.005–1.030)
WBC, UA: >50 WBC/hpf — ABNORMAL HIGH (ref 0–5)
pH: 6 (ref 5.0–8.0)

## 2021-05-01 LAB — CBC
HCT: 29.7 % — ABNORMAL LOW (ref 36.0–46.0)
Hemoglobin: 9.9 g/dL — ABNORMAL LOW (ref 12.0–15.0)
MCH: 32.8 pg (ref 26.0–34.0)
MCHC: 33.3 g/dL (ref 30.0–36.0)
MCV: 98.3 fL (ref 80.0–100.0)
Platelets: 226 10*3/uL (ref 150–400)
RBC: 3.02 MIL/uL — ABNORMAL LOW (ref 3.87–5.11)
RDW: 12.6 % (ref 11.5–15.5)
WBC: 12.5 10*3/uL — ABNORMAL HIGH (ref 4.0–10.5)
nRBC: 0 % (ref 0.0–0.2)

## 2021-05-01 LAB — T4, FREE: Free T4: 1.04 ng/dL (ref 0.61–1.12)

## 2021-05-01 MED ORDER — BISACODYL 10 MG RE SUPP
10.0000 mg | Freq: Once | RECTAL | Status: AC
  Administered 2021-05-01: 10 mg via RECTAL
  Filled 2021-05-01: qty 1

## 2021-05-01 MED ORDER — AMIODARONE HCL 200 MG PO TABS
100.0000 mg | ORAL_TABLET | Freq: Every day | ORAL | Status: DC
Start: 2021-05-02 — End: 2021-05-02
  Administered 2021-05-02: 100 mg via ORAL
  Filled 2021-05-01: qty 1

## 2021-05-01 MED ORDER — SENNA 8.6 MG PO TABS
1.0000 | ORAL_TABLET | Freq: Every day | ORAL | Status: DC
  Administered 2021-05-01 – 2021-05-02 (×2): 8.6 mg via ORAL
  Filled 2021-05-01 (×2): qty 1

## 2021-05-01 MED ORDER — POLYETHYLENE GLYCOL 3350 17 G PO PACK
17.0000 g | PACK | Freq: Two times a day (BID) | ORAL | Status: DC
  Administered 2021-05-01 – 2021-05-02 (×2): 17 g via ORAL
  Filled 2021-05-01 (×3): qty 1

## 2021-05-01 NOTE — Progress Notes (Signed)
Inpatient Rehab Admissions Coordinator:   I do not have insurance auth or a bed for this Pt. Today. Continue to await decision on expedited appeal. I will follow for potential admit pending insurance auth and bed availability.  Clemens Catholic, [place], [place] Admissions Coordinator  [phone] ([place]) [phone] (office)

## 2021-05-01 NOTE — Progress Notes (Signed)
Progress Note  Patient: Rita Yoder SWH:[id number] DOB: April 24, 1940  DOA: 04/23/2021  DOS: 05/01/2021    Brief hospital course: Rita Yoder is a 81 y.o. female with medical history significant of HTN, IIDM, HLD, stage IIIb, normocytic anemia, came with new onset of right arm and hand numbness tingling.  Admitted with acute stroke.     CT had hypodensity of left thalamus suspicious for acute/subacute stroke. MRI revealed left sided patchy infarcts suggestive of embolic source, small vessel ischemic changes, and possible SAH along the right parieto-occipital lobe. Due to concern for microhemorrhages, DAPT not recommended, and the patient was started on aspirin alone. Due to right hand twitching and concern for focal seizure, keppra was loaded, with resolution of tremor. EEG not performed due to glued wig in place.   She develops Paroxysmal A fib, Dr Rita Yoder discussed with neurology, patient was started on eliquis.   Assessment and Plan: Acute multifocal strokes including left ischemic thalamus stroke and left parietal cortical and subcortical infarcts with concomitant right-sided weakness.  - Given detection of atrial fibrillation on 2/17 - 2/18, started DOAC and DC DAPT.   - LDL 78, slightly above goal of 70, augmented atorvastatin 20mg  > 40mg . - Echo without IAS or CES. - PT recommends CIR at discharge. Peer to peer denied, undergoing appeal process.    New diagnosis paroxysmal atrial fibrillation: Back in sinus rhythm quickly. - Started DOAC, eliquis preferred with lower risk of GI and IC hemorrhage.  - Avoiding AV nodal agents at this time with baseline sinus bradycardia. -develops ARV 2/21. Dr Rita Yoder consulted. Patient started on  Amiodarone.   Suspicion for focal seizure:  - Continue keppra. Pt's wig is not able to be removed for EEG.    HTN:  - Continue with Imdur.  Started Norvasc for better BP controlled.    T2DM: HbA1c 4.5%.  - SSI stopped due to hypoglycemia.  Encourage frequent meals   CKD stage IIIb: CrCl ~53ml/min. - Euvolemic, creatinine level stable/improved from admission to baseline.  Holding HCTZ.  -Prior Cr 2.8 Renal function stable   Constipation;  Last BM 2/17.  Dulcolax suppository. Add senna, miralax.   Leukocytosis; check UA  Anemia of CKD: Stable - Outpatient follow-up with PCP and nephrology.  Mildly elevated TSH: Not diagnostic in acute setting. - Recheck after discharge.   Obesity: Estimated body mass index is 40.49 kg/m as calculated from the following:   Height as of 03/25/21: 5\' 1"  (1.549 m).   Weight as of 03/25/21: 97.2 kg.  Obesity: Estimated body mass index is 33.84 kg/m as calculated from the following:   Height as of this encounter: 5\' 6"  (1.676 m).   Weight as of this encounter: 95.1 kg.  Subjective:  She is alert, denies pain. No BM in several days.   Objective: Vitals:   05/01/21 0300 05/01/21 0700 05/01/21 0800 05/01/21 1117  BP: (!) 151/57 (!) 148/67  124/80  Pulse: 63 66  73  Resp: 18 18  16   Temp: 98.9 F (37.2 C) 98.8 F (37.1 C)  97.8 F (36.6 C)  TempSrc: Axillary Oral  Oral  SpO2: 99% 99% 98% 99%  Weight:      Height:      Gen: 81 y.o. female NAD Pulm: CTA CV: S 1, S 2 RRR GI: BS present, soft, nt Neuro: alert, pleasant. . Right side neglect.    Telemetry (personal review): Sinus rhythm/bradycardia, did have atrial fibrillation 2/18. RVR 2/21   MR brain 2/14: Patchy acute  cortical/subcortical left parietal lobe infarcts on background moderate-severe chronic small vessel ischemic disease. Hemosiderin and chronic microhemorrhages within/along right parietooccipital lobe suggest remote [facility] and may reflect cerebral amyloid angiopathy.  Family Communication: Granddaughter at bedside  Disposition: Status is: Inpatient Remains inpatient appropriate because: unsafe DC. MEDICALLY STABLE FOR DISCHARGE. Planned Discharge Destination: Home vs. CIR (awaiting appeal)  Rita Shiley, MD 05/01/2021 2:09 PM Page by [email or web address]

## 2021-05-01 NOTE — Progress Notes (Signed)
Occupational Therapy Treatment Patient Details Name: Rita Yoder MRN: [id number] DOB: [birth year] Today's Date: 05/01/2021   History of present illness 81 y/o female presenting on 2/14 with R sided numbness. MRI with small patchy acute cortical/subcortical infarcts in L parietal lobe. Noted worsening dysarthria and twitching of R hand 2/15- plan for EEG.  PMH includes: CKD, HTN.   OT comments  Patient received in supine and agreeable to PT/OT session. Patient required max assist +2 to get to EOB from supine with patient attempting to use LUE to assist. Patient instructed in grooming tasks seated on EOB incorporating RUE use with patient supporting RUE with LUE.  Patient was transferred to recliner and performed standing at sink to allow for mirror to assist with attention to right. Patient required max assist to use RUE for grooming tasks. Acute OT to continue to follow.    Recommendations for follow up therapy are one component of a multi-disciplinary discharge planning process, led by the attending physician.  Recommendations may be updated based on patient status, additional functional criteria and insurance authorization.    Follow Up Recommendations  Acute inpatient rehab (3hours/day)    Assistance Recommended at Discharge Frequent or constant Supervision/Assistance  Patient can return home with the following  Two people to help with walking and/or transfers;Two people to help with bathing/dressing/bathroom;Assistance with cooking/housework;Assistance with feeding;Direct supervision/assist for medications management;Direct supervision/assist for financial management;Help with stairs or ramp for entrance   Equipment Recommendations  Other (comment) (TBD)    Recommendations for Other Services      Precautions / Restrictions Precautions Precautions: Fall Precaution Comments: pt with sever R sided neglect Restrictions Weight Bearing Restrictions: No       Mobility Bed  Mobility Overal bed mobility: Needs Assistance Bed Mobility: Supine to Sit     Supine to sit: HOB elevated, Max assist, +2 for physical assistance     General bed mobility comments: requires assistance with BLEs and trunk with verbal cues to use LUE to assist    Transfers Overall transfer level: Needs assistance Equipment used: 2 person hand held assist (with use of bed pad and gait belt with face to face transfer) Transfers: Sit to/from Stand, Bed to chair/wheelchair/BSC Sit to Stand: Mod assist, +2 physical assistance     Step pivot transfers: Mod assist, Max assist, +2 physical assistance     General transfer comment: Assist of 2 to power up and weight shift to perform step pivot transfer into recliner     Balance Overall balance assessment: Needs assistance Sitting-balance support: Feet supported, Single extremity supported Sitting balance-Leahy Scale: Fair Sitting balance - Comments: min guard for sitting balance onEOB while performing grooming tasks and reaching task to increase attention to right   Standing balance support: Bilateral upper extremity supported, During functional activity, Reliant on assistive device for balance Standing balance-Leahy Scale: Poor Standing balance comment: Stood at sink with max assist of 2                           ADL either performed or assessed with clinical judgement   ADL Overall ADL's : Needs assistance/impaired     Grooming: Wash/dry hands;Minimal assistance;Moderate assistance Grooming Details (indicate cue type and reason): performed face washing on EOB with LUE and min verbal cues, mod assist and assistance with LUE to use RUE.  Attempted to use RUE while standing at sink with max assist  General ADL Comments: grooming performed with attempts to increase RUE use and right visual field scanning    Extremity/Trunk Assessment Upper Extremity Assessment Upper Extremity  Assessment: RUE deficits/detail RUE Deficits / Details: difficulty with RUE movements on commands, attempts to use with functional tasks RUE Sensation: decreased light touch            Vision   Eye Alignment: Within Functional Limits Ocular Range of Motion: Restricted looking up;Restricted looking down;Restricted on the right Alignment/Gaze Preference: Gaze left Tracking/Visual Pursuits: Right eye does not track laterally Saccades: Impaired - to be further tested in functional context Visual Fields: Right visual field deficit Depth Perception: Overshoots Additional Comments: required verbal and tactile cues to look to right   Perception     Praxis      Cognition Arousal/Alertness: Awake/alert Behavior During Therapy: Flat affect Overall Cognitive Status: Impaired/Different from baseline Area of Impairment: Problem solving, Awareness, Following commands, Attention, Orientation, Safety/judgement                 Orientation Level: Situation, Time Current Attention Level: Sustained   Following Commands: Follows one step commands inconsistently, Follows one step commands with increased time Safety/Judgement: Decreased awareness of safety, Decreased awareness of deficits Awareness: Intellectual Problem Solving: Slow processing, Requires verbal cues, Requires tactile cues, Difficulty sequencing, Decreased initiation General Comments: Patient  continues to demonstrate severe right neglect and requires verbal and tactile cues        Exercises      Shoulder Instructions       General Comments      Pertinent Vitals/ Pain       Pain Assessment Pain Intervention(s): Monitored during session  Home Living                                          Prior Functioning/Environment              Frequency  Min 2X/week        Progress Toward Goals  OT Goals(current goals can now be found in the care plan section)  Progress towards OT goals:  Progressing toward goals  Acute Rehab OT Goals Patient Stated Goal: go to rehab OT Goal Formulation: With patient/family Time For Goal Achievement: 05/09/21 Potential to Achieve Goals: Good ADL Goals Pt Will Perform Grooming: with mod assist;sitting Pt Will Perform Upper Body Bathing: with mod assist;sitting Pt Will Perform Lower Body Bathing: with max assist;sitting/lateral leans;sit to/from stand Pt Will Perform Upper Body Dressing: with mod assist;sitting Pt Will Perform Lower Body Dressing: with max assist;sitting/lateral leans;sit to/from stand Pt Will Transfer to Toilet: with max assist;stand pivot transfer Pt Will Perform Toileting - Clothing Manipulation and hygiene: with max assist;sitting/lateral leans;sit to/from stand  Plan Discharge plan remains appropriate    Co-evaluation    PT/OT/SLP Co-Evaluation/Treatment: Yes Reason for Co-Treatment: For patient/therapist safety   OT goals addressed during session: ADL's and self-care      AM-PAC OT "6 Clicks" Daily Activity     Outcome Measure   Help from another person eating meals?: A Lot Help from another person taking care of personal grooming?: A Little Help from another person toileting, which includes using toliet, bedpan, or urinal?: A Lot Help from another person bathing (including washing, rinsing, drying)?: A Lot Help from another person to put on and taking off regular upper body clothing?: A Lot Help from another person to  put on and taking off regular lower body clothing?: A Lot 6 Click Score: 13    End of Session Equipment Utilized During Treatment: Gait belt  OT Visit Diagnosis: Unsteadiness on feet (R26.81);Other abnormalities of gait and mobility (R26.89);Muscle weakness (generalized) (M62.81);Low vision, both eyes (H54.2);Hemiplegia and hemiparesis Hemiplegia - Right/Left: Right Hemiplegia - dominant/non-dominant: Dominant Hemiplegia - caused by: Cerebral infarction   Activity Tolerance Patient  tolerated treatment well   Patient Left in chair;with call bell/phone within reach;with family/visitor present   Nurse Communication Mobility status;Need for lift equipment        Time: 1023-1050 OT Time Calculation (min): 27 min  Charges: OT General Charges $OT Visit: 1 Visit OT Treatments $Self Care/Home Management : 8-22 mins  Lodema Hong, [place]  Pager [phone] Office [place] 05/01/2021, 11:52 AM

## 2021-05-01 NOTE — Care Management Important Message (Signed)
Important Message  Patient Details  Name: Rita Yoder MRN: [id number] Date of Birth: March 30, 1940   Medicare Important Message Given:  Yes     Orbie Pyo 05/01/2021, 10:48 AM

## 2021-05-01 NOTE — Progress Notes (Signed)
Physical Therapy Treatment Patient Details Name: Rita Yoder MRN: [id number] DOB: 03-17-1940 Today's Date: 05/01/2021   History of Present Illness 81 y/o female presenting on 2/14 with R sided numbness. MRI with small patchy acute cortical/subcortical infarcts in L parietal lobe. Noted worsening dysarthria and twitching of R hand 2/15- plan for EEG.  PMH includes: CKD, HTN.    PT Comments    Pt seen with OT to progress functional tasks/ADLs. Pt continues with severe R sided neglect and inability to track past midline to the R with her eyes. Worked on functional use of R UE while standing at the sink. PT provided maxA at R LE to provide optimal support while OT and SLP worked with tending to the R and ADL task. Pt with noted increased flexor tone in her R UE but not LE. Pt able to stand without R Knee bucking however has poor endurance requiring maxA from PT at R Hip and knee to maintain standing. Continue to recommend AIR upon d/c. Acute PT to cont to follow.    Recommendations for follow up therapy are one component of a multi-disciplinary discharge planning process, led by the attending physician.  Recommendations may be updated based on patient status, additional functional criteria and insurance authorization.  Follow Up Recommendations  Acute inpatient rehab (3hours/day)     Assistance Recommended at Discharge Frequent or constant Supervision/Assistance  Patient can return home with the following Two people to help with walking and/or transfers;A lot of help with bathing/dressing/bathroom;Assistance with cooking/housework;Assist for transportation;Help with stairs or ramp for entrance;Direct supervision/assist for medications management;Direct supervision/assist for financial management   Equipment Recommendations  Rolling walker (2 wheels)    Recommendations for Other Services Rehab consult     Precautions / Restrictions Precautions Precautions: Fall Precaution Comments: pt  with sever R sided neglect Restrictions Weight Bearing Restrictions: No     Mobility  Bed Mobility Overal bed mobility: Needs Assistance Bed Mobility: Supine to Sit     Supine to sit: HOB elevated, Max assist, +2 for physical assistance     General bed mobility comments: requires assistance with BLEs and trunk elevation with verbal cues to use LUE to assist    Transfers Overall transfer level: Needs assistance Equipment used: 2 person hand held assist (with use of bed pad and gait belt with face to face transfer) Transfers: Sit to/from Stand, Bed to chair/wheelchair/BSC Sit to Stand: Mod assist, +2 physical assistance   Step pivot transfers: Mod assist, Max assist, +2 physical assistance       General transfer comment: Assist of 2 to power up and weight shift to perform step pivot transfer into recliner    Ambulation/Gait               General Gait Details: pt unable at this time   Stairs             Wheelchair Mobility    Modified Rankin (Stroke Patients Only) Modified Rankin (Stroke Patients Only) Pre-Morbid Rankin Score: Moderate disability Modified Rankin: Severe disability     Balance Overall balance assessment: Needs assistance Sitting-balance support: Feet supported, Single extremity supported Sitting balance-Leahy Scale: Fair Sitting balance - Comments: min guard for sitting balance onEOB while performing grooming tasks and reaching task to increase attention to right   Standing balance support: Bilateral upper extremity supported, During functional activity, Reliant on assistive device for balance Standing balance-Leahy Scale: Poor Standing balance comment: Stood at sink with max assist of 2  Cognition Arousal/Alertness: Awake/alert Behavior During Therapy: Flat affect Overall Cognitive Status: Impaired/Different from baseline Area of Impairment: Problem solving, Awareness, Following commands,  Attention, Orientation, Safety/judgement                 Orientation Level: Situation, Time Current Attention Level: Sustained   Following Commands: Follows one step commands inconsistently, Follows one step commands with increased time Safety/Judgement: Decreased awareness of safety, Decreased awareness of deficits Awareness: Intellectual Problem Solving: Slow processing, Requires verbal cues, Requires tactile cues, Difficulty sequencing, Decreased initiation General Comments: Patient  continues to demonstrate severe right neglect and requires verbal and tactile cues to tend to R UE and LE, unable to track past midline to the R        Exercises      General Comments General comments (skin integrity, edema, etc.): VSS      Pertinent Vitals/Pain Pain Assessment Pain Assessment: Faces Faces Pain Scale: No hurt    Home Living                          Prior Function            PT Goals (current goals can now be found in the care plan section) Acute Rehab PT Goals Patient Stated Goal: none stated PT Goal Formulation: With patient Time For Goal Achievement: 05/08/21 Potential to Achieve Goals: Good Progress towards PT goals: Progressing toward goals    Frequency    Min 4X/week      PT Plan Current plan remains appropriate    Co-evaluation PT/OT/SLP Co-Evaluation/Treatment: Yes Reason for Co-Treatment: To address functional/ADL transfers PT goals addressed during session: Mobility/safety with mobility OT goals addressed during session: ADL's and self-care      AM-PAC PT "6 Clicks" Mobility   Outcome Measure  Help needed turning from your back to your side while in a flat bed without using bedrails?: A Lot Help needed moving from lying on your back to sitting on the side of a flat bed without using bedrails?: A Lot Help needed moving to and from a bed to a chair (including a wheelchair)?: Total Help needed standing up from a chair using your  arms (e.g., wheelchair or bedside chair)?: Total Help needed to walk in hospital room?: Total Help needed climbing 3-5 steps with a railing? : Total 6 Click Score: 8    End of Session Equipment Utilized During Treatment: Gait belt Activity Tolerance: Patient tolerated treatment well Patient left: with call bell/phone within reach;with family/visitor present;in chair (SLP present) Nurse Communication: Mobility status;Need for lift equipment (use stedy to transfer pt back to bed) PT Visit Diagnosis: Muscle weakness (generalized) (M62.81);Difficulty in walking, not elsewhere classified (R26.2)     Time: 1022-1050 PT Time Calculation (min) (ACUTE ONLY): 28 min  Charges:  $Neuromuscular Re-education: 8-22 mins                     Kittie Plater, PT, DPT Acute Rehabilitation Services Pager #: [phone] Office #: [phone]    [place]line Lopes 05/01/2021, 1:52 PM

## 2021-05-01 NOTE — Progress Notes (Signed)
Speech Language Pathology Treatment: Cognitive-Linquistic  Patient Details Name: Rita Yoder MRN: [id number] DOB: [birth year] Today's Date: 05/01/2021 Time: 1045-1100 SLP Time Calculation (min) (ACUTE ONLY): 15 min  Assessment / Plan / Recommendation Clinical Impression  Rita Yoder received speech-language services to target dysarthria and aphasia goals. The patient benefited from maximum verbal, visual, and tactile supports to facilitate focused attention when following commands to repeat and expand utterance length. Patient is spontaneously and independently forming 2-3 word phrases, however she continues to require modeling of phrases to communicate wants/needs. SLP used pictures to engage patient in naming task; used tactile cue of placing hand on picture to benefit pt. The patient did not independently name any objects in pictures, however she accurately repeated "blue", "kite", and "coat." SLP integrated tactile naming activity and patient benefited from having 3 models of the word, and then a phonemic cue to produce the target word. Patient did this 3x throughout the session with success. Patient is consistently answering yes/no questions. SLP educated family members on facilitating choices across activities (I.e., mealtimes) to improve verbal output. Patient will benefit from receiving speech services at a mealtime to demonstrate motivating and meaningful activity for patient/family to facilitate carryover of communicating wants/needs and expanding MLU. SLP to continue to follow acutely.   HPI HPI: Pt is an 81 y.o. female who presented with new onset of right arm and hand numbness tingling. MRI brain: Small patchy acute cortical/subcortical infarcts within the left parietal lobe. Worsening dysarthria and rhythmic R hand twitching that she was unable to volitionally stop 2/15; focal seizure questioned by neurology. EEG could not be completed. PMH: HTN, IIDM, HLD, stage IIIb, normocytic  anemia.      SLP Plan  Continue with current plan of care      Recommendations for follow up therapy are one component of a multi-disciplinary discharge planning process, led by the attending physician.  Recommendations may be updated based on patient status, additional functional criteria and insurance authorization.    Recommendations                   Oral Care Recommendations: Oral care BID Follow Up Recommendations: Acute inpatient rehab (3hours/day) Assistance recommended at discharge: Frequent or constant Supervision/Assistance SLP Visit Diagnosis: Aphasia (R47.01);Dysarthria and anarthria (R47.1) Plan: Continue with current plan of care           [facility]  05/01/2021, 11:23 AM

## 2021-05-01 NOTE — Progress Notes (Signed)
Subjective:  Doing well.  Denies any chest pain or shortness of breath.  Denies palpitations.  No further episodes of A. Fib with RVR, remains in sinus rhythm.  Heart rate in the high 50s to low 60s.  Objective:  Vital Signs in the last 24 hours: Temp:  [98.1 F (36.7 C)-98.9 F (37.2 C)] 98.8 F (37.1 C) (02/22 0700) Pulse Rate:  [59-66] 66 (02/22 0700) Resp:  [16-20] 18 (02/22 0700) BP: (148-185)/(56-79) 148/67 (02/22 0700) SpO2:  [99 %-100 %] 99 % (02/22 0700)  Intake/Output from previous day: 02/21 0701 - 02/22 0700 In: -  Out: 1700 [Urine:1700] Intake/Output from this shift: No intake/output data recorded.  Physical Exam: Exam unchanged  Lab Results: Recent Labs    05/01/21 0820  WBC 12.5*  HGB 9.9*  PLT 226   Recent Labs    05/01/21 0820  NA 133*  K 4.7  CL 102  CO2 20*  GLUCOSE 113*  BUN 59*  CREATININE 2.83*   No results for input(s): TROPONINI in the last 72 hours.  Invalid input(s): CK, MB Hepatic Function Panel No results for input(s): PROT, ALBUMIN, AST, ALT, ALKPHOS, BILITOT, BILIDIR, IBILI in the last 72 hours. No results for input(s): CHOL in the last 72 hours. No results for input(s): PROTIME in the last 72 hours.  Imaging: No results found.  Cardiac Studies:  Assessment/Plan:  Probably cardioembolic.  Acute multifocal strokes including left ischemic thalamic stroke and left parietal cortical and subcortical infarcts.with possible remote subarachnoid hemorrhage Status post Recurrent Paroxysmal atrial fibrillation Chadsvasc score of 6 started on chronic anticoagulation. Hypertension. Hyperlipidemia. Diabetes mellitus.controlled by diet Chronic kidney disease stage IV. Status post questionable focal seizures. Hypothyroidism. Plan Reduce amiodarone to 100 mg daily. Consider starting Synthroid 25 g daily. I will sign off, please call if needed. Follow-up with me in 2 weeks after discharge  LOS: 7 days    Rita Yoder 05/01/2021,  10:47 AM

## 2021-05-02 ENCOUNTER — Encounter (HOSPITAL_COMMUNITY): Payer: Self-pay | Admitting: Physical Medicine & Rehabilitation

## 2021-05-02 ENCOUNTER — Inpatient Hospital Stay (HOSPITAL_COMMUNITY)
Admission: RE | Admit: 2021-05-02 | Discharge: 2021-05-24 | DRG: 057 | Disposition: A | Discharge status: Home Health | Payer: Medicare Other | Source: Intra-hospital | Attending: Physical Medicine & Rehabilitation | Admitting: Physical Medicine & Rehabilitation

## 2021-05-02 ENCOUNTER — Other Ambulatory Visit (HOSPITAL_COMMUNITY): Payer: Self-pay

## 2021-05-02 ENCOUNTER — Other Ambulatory Visit: Payer: Self-pay

## 2021-05-02 DIAGNOSIS — I4892 Unspecified atrial flutter: Secondary | ICD-10-CM | POA: Diagnosis not present

## 2021-05-02 DIAGNOSIS — Z6832 Body mass index (BMI) 32.0-32.9, adult: Secondary | ICD-10-CM

## 2021-05-02 DIAGNOSIS — E869 Volume depletion, unspecified: Secondary | ICD-10-CM | POA: Diagnosis not present

## 2021-05-02 DIAGNOSIS — R7401 Elevation of levels of liver transaminase levels: Secondary | ICD-10-CM | POA: Diagnosis present

## 2021-05-02 DIAGNOSIS — E039 Hypothyroidism, unspecified: Secondary | ICD-10-CM | POA: Diagnosis present

## 2021-05-02 DIAGNOSIS — I69320 Aphasia following cerebral infarction: Secondary | ICD-10-CM

## 2021-05-02 DIAGNOSIS — I69351 Hemiplegia and hemiparesis following cerebral infarction affecting right dominant side: Principal | ICD-10-CM

## 2021-05-02 DIAGNOSIS — Z79899 Other long term (current) drug therapy: Secondary | ICD-10-CM

## 2021-05-02 DIAGNOSIS — N390 Urinary tract infection, site not specified: Secondary | ICD-10-CM | POA: Diagnosis not present

## 2021-05-02 DIAGNOSIS — R001 Bradycardia, unspecified: Secondary | ICD-10-CM | POA: Diagnosis not present

## 2021-05-02 DIAGNOSIS — I480 Paroxysmal atrial fibrillation: Secondary | ICD-10-CM | POA: Diagnosis not present

## 2021-05-02 DIAGNOSIS — D631 Anemia in chronic kidney disease: Secondary | ICD-10-CM | POA: Diagnosis present

## 2021-05-02 DIAGNOSIS — N179 Acute kidney failure, unspecified: Secondary | ICD-10-CM | POA: Diagnosis not present

## 2021-05-02 DIAGNOSIS — I129 Hypertensive chronic kidney disease with stage 1 through stage 4 chronic kidney disease, or unspecified chronic kidney disease: Secondary | ICD-10-CM | POA: Diagnosis present

## 2021-05-02 DIAGNOSIS — Z8249 Family history of ischemic heart disease and other diseases of the circulatory system: Secondary | ICD-10-CM

## 2021-05-02 DIAGNOSIS — N1832 Chronic kidney disease, stage 3b: Secondary | ICD-10-CM | POA: Diagnosis not present

## 2021-05-02 DIAGNOSIS — Z90710 Acquired absence of both cervix and uterus: Secondary | ICD-10-CM

## 2021-05-02 DIAGNOSIS — I2510 Atherosclerotic heart disease of native coronary artery without angina pectoris: Secondary | ICD-10-CM | POA: Diagnosis present

## 2021-05-02 DIAGNOSIS — E1122 Type 2 diabetes mellitus with diabetic chronic kidney disease: Secondary | ICD-10-CM | POA: Diagnosis present

## 2021-05-02 DIAGNOSIS — E669 Obesity, unspecified: Secondary | ICD-10-CM | POA: Diagnosis present

## 2021-05-02 DIAGNOSIS — D72829 Elevated white blood cell count, unspecified: Secondary | ICD-10-CM | POA: Diagnosis not present

## 2021-05-02 DIAGNOSIS — I639 Cerebral infarction, unspecified: Secondary | ICD-10-CM | POA: Diagnosis present

## 2021-05-02 DIAGNOSIS — E1165 Type 2 diabetes mellitus with hyperglycemia: Secondary | ICD-10-CM | POA: Diagnosis not present

## 2021-05-02 DIAGNOSIS — I6300 Cerebral infarction due to thrombosis of unspecified precerebral artery: Secondary | ICD-10-CM

## 2021-05-02 DIAGNOSIS — E875 Hyperkalemia: Secondary | ICD-10-CM | POA: Diagnosis not present

## 2021-05-02 DIAGNOSIS — I10 Essential (primary) hypertension: Secondary | ICD-10-CM | POA: Diagnosis not present

## 2021-05-02 DIAGNOSIS — R414 Neurologic neglect syndrome: Secondary | ICD-10-CM | POA: Diagnosis not present

## 2021-05-02 DIAGNOSIS — K5900 Constipation, unspecified: Secondary | ICD-10-CM | POA: Diagnosis not present

## 2021-05-02 DIAGNOSIS — E785 Hyperlipidemia, unspecified: Secondary | ICD-10-CM | POA: Diagnosis not present

## 2021-05-02 DIAGNOSIS — I4891 Unspecified atrial fibrillation: Secondary | ICD-10-CM | POA: Diagnosis not present

## 2021-05-02 DIAGNOSIS — Z741 Need for assistance with personal care: Secondary | ICD-10-CM | POA: Diagnosis present

## 2021-05-02 DIAGNOSIS — M545 Low back pain, unspecified: Secondary | ICD-10-CM | POA: Diagnosis present

## 2021-05-02 DIAGNOSIS — N184 Chronic kidney disease, stage 4 (severe): Secondary | ICD-10-CM

## 2021-05-02 DIAGNOSIS — D472 Monoclonal gammopathy: Secondary | ICD-10-CM | POA: Diagnosis present

## 2021-05-02 DIAGNOSIS — R739 Hyperglycemia, unspecified: Secondary | ICD-10-CM

## 2021-05-02 LAB — GLUCOSE, CAPILLARY
Glucose-Capillary: 118 mg/dL — ABNORMAL HIGH (ref 70–99)
Glucose-Capillary: 108 mg/dL — ABNORMAL HIGH (ref 70–99)
Glucose-Capillary: 122 mg/dL — ABNORMAL HIGH (ref 70–99)

## 2021-05-02 LAB — T3, FREE: T3, Free: 1.6 pg/mL — ABNORMAL LOW (ref 2.0–4.4)

## 2021-05-02 MED ORDER — APIXABAN 2.5 MG PO TABS
2.5000 mg | ORAL_TABLET | Freq: Two times a day (BID) | ORAL | 1 refills | Status: DC
  Filled 2021-05-02: qty 60, 30d supply, fill #0

## 2021-05-02 MED ORDER — CEPHALEXIN 500 MG PO CAPS
500.0000 mg | ORAL_CAPSULE | Freq: Four times a day (QID) | ORAL | 0 refills | Status: DC
  Filled 2021-05-02: qty 12, 3d supply, fill #0

## 2021-05-02 MED ORDER — CEPHALEXIN 250 MG PO CAPS: 500.0000 mg | ORAL_CAPSULE | Freq: Four times a day (QID) | ORAL | Status: DC

## 2021-05-02 MED ORDER — LOPERAMIDE HCL 2 MG PO CAPS: 2.0000 mg | ORAL_CAPSULE | ORAL | Status: DC | PRN

## 2021-05-02 MED ORDER — AMLODIPINE BESYLATE 5 MG PO TABS
5.0000 mg | ORAL_TABLET | Freq: Every day | ORAL | Status: DC
  Administered 2021-05-03 – 2021-05-13 (×11): 5 mg via ORAL
  Filled 2021-05-02 (×11): qty 1

## 2021-05-02 MED ORDER — SORBITOL 70 % SOLN
30.0000 mL | Freq: Every day | Status: DC | PRN
  Administered 2021-05-02 – 2021-05-23 (×2): 30 mL via ORAL
  Filled 2021-05-02: qty 30

## 2021-05-02 MED ORDER — POLYETHYLENE GLYCOL 3350 17 GM/SCOOP PO POWD
17.0000 g | Freq: Two times a day (BID) | ORAL | 0 refills | Status: DC
  Filled 2021-05-02: qty 238, 7d supply, fill #0

## 2021-05-02 MED ORDER — SODIUM CHLORIDE 0.9 % IV SOLN
1.0000 g | INTRAVENOUS | Status: DC
  Administered 2021-05-02: 1 g via INTRAVENOUS
  Filled 2021-05-02: qty 10

## 2021-05-02 MED ORDER — AMIODARONE HCL 200 MG PO TABS
100.0000 mg | ORAL_TABLET | Freq: Every day | ORAL | Status: DC
  Administered 2021-05-03 – 2021-05-20 (×18): 100 mg via ORAL
  Filled 2021-05-02 (×18): qty 1

## 2021-05-02 MED ORDER — LEVOTHYROXINE SODIUM 25 MCG PO TABS
25.0000 ug | ORAL_TABLET | Freq: Every day | ORAL | Status: DC
Start: 2021-05-03 — End: 2021-05-24
  Administered 2021-05-03 – 2021-05-24 (×22): 25 ug via ORAL
  Filled 2021-05-02 (×22): qty 1

## 2021-05-02 MED ORDER — LEVETIRACETAM 500 MG PO TABS
500.0000 mg | ORAL_TABLET | Freq: Two times a day (BID) | ORAL | 1 refills | Status: DC
Start: 2021-05-02 — End: 2021-05-24
  Filled 2021-05-02: qty 60, 30d supply, fill #0

## 2021-05-02 MED ORDER — ACETAMINOPHEN 500 MG PO TABS
500.0000 mg | ORAL_TABLET | Freq: Four times a day (QID) | ORAL | Status: DC | PRN
Start: 2021-05-02 — End: 2021-05-03

## 2021-05-02 MED ORDER — DARIFENACIN HYDROBROMIDE ER 7.5 MG PO TB24
7.5000 mg | ORAL_TABLET | Freq: Every day | ORAL | Status: DC
  Administered 2021-05-03 – 2021-05-24 (×22): 7.5 mg via ORAL
  Filled 2021-05-02 (×22): qty 1

## 2021-05-02 MED ORDER — SENNA 8.6 MG PO TABS
1.0000 | ORAL_TABLET | Freq: Every day | ORAL | Status: DC
  Administered 2021-05-03 – 2021-05-24 (×21): 8.6 mg via ORAL
  Filled 2021-05-02 (×21): qty 1

## 2021-05-02 MED ORDER — SENNA 8.6 MG PO TABS
1.0000 | ORAL_TABLET | Freq: Every day | ORAL | 0 refills | Status: DC
  Filled 2021-05-02: qty 30, 30d supply, fill #0

## 2021-05-02 MED ORDER — APIXABAN 2.5 MG PO TABS
2.5000 mg | ORAL_TABLET | Freq: Two times a day (BID) | ORAL | Status: DC
  Administered 2021-05-02 – 2021-05-24 (×44): 2.5 mg via ORAL
  Filled 2021-05-02 (×44): qty 1

## 2021-05-02 MED ORDER — AMLODIPINE BESYLATE 5 MG PO TABS
5.0000 mg | ORAL_TABLET | Freq: Every day | ORAL | 0 refills | Status: DC
  Filled 2021-05-02: qty 60, 60d supply, fill #0

## 2021-05-02 MED ORDER — POLYETHYLENE GLYCOL 3350 17 G PO PACK
17.0000 g | PACK | Freq: Two times a day (BID) | ORAL | Status: DC
  Administered 2021-05-02 – 2021-05-24 (×24): 17 g via ORAL
  Filled 2021-05-02 (×37): qty 1

## 2021-05-02 MED ORDER — ATORVASTATIN CALCIUM 40 MG PO TABS
40.0000 mg | ORAL_TABLET | Freq: Every day | ORAL | Status: DC
  Administered 2021-05-03 – 2021-05-24 (×22): 40 mg via ORAL
  Filled 2021-05-02 (×22): qty 1

## 2021-05-02 MED ORDER — AMIODARONE HCL 200 MG PO TABS
100.0000 mg | ORAL_TABLET | Freq: Every day | ORAL | 0 refills | Status: DC
  Filled 2021-05-02: qty 30, 60d supply, fill #0

## 2021-05-02 MED ORDER — CEPHALEXIN 250 MG PO CAPS
500.0000 mg | ORAL_CAPSULE | Freq: Two times a day (BID) | ORAL | Status: AC
  Administered 2021-05-03 – 2021-05-05 (×6): 500 mg via ORAL
  Filled 2021-05-02 (×6): qty 2

## 2021-05-02 MED ORDER — ATORVASTATIN CALCIUM 40 MG PO TABS
40.0000 mg | ORAL_TABLET | Freq: Every day | ORAL | 2 refills | Status: DC
  Filled 2021-05-02: qty 30, 30d supply, fill #0

## 2021-05-02 MED ORDER — ISOSORBIDE MONONITRATE ER 30 MG PO TB24
30.0000 mg | ORAL_TABLET | Freq: Every day | ORAL | Status: DC
  Administered 2021-05-03 – 2021-05-10 (×8): 30 mg via ORAL
  Filled 2021-05-02 (×8): qty 1

## 2021-05-02 MED ORDER — LEVETIRACETAM 500 MG PO TABS
500.0000 mg | ORAL_TABLET | Freq: Two times a day (BID) | ORAL | Status: DC
  Administered 2021-05-02 – 2021-05-12 (×20): 500 mg via ORAL
  Filled 2021-05-02 (×21): qty 1

## 2021-05-02 NOTE — TOC Transition Note (Signed)
Transition of Care [facility]) - CM/SW Discharge Note   Patient Details  Name: Rita Yoder MRN: [id number] Date of Birth: 1940/07/31  Transition of Care [facility]) CM/SW Contact:  Pollie Friar, RN Phone Number: 05/02/2021, 10:13 AM   Clinical Narrative:    Patient is discharging to CIR today. CM signing off.    Final next level of care: IP Rehab Facility Barriers to Discharge: No Barriers Identified   Patient Goals and CMS Choice Patient states their goals for this hospitalization and ongoing recovery are:: Pt unable to participate in goal setting due to disorientation. CMS Medicare.gov Compare Post Acute Care list provided to:: Patient Represenative (must comment) (Children) Choice offered to / list presented to : Patient  Discharge Placement                       Discharge Plan and Services     Post Acute Care Choice: Home Health                               Social Determinants of Health (SDOH) Interventions     Readmission Risk Interventions No flowsheet data found.

## 2021-05-02 NOTE — Progress Notes (Signed)
IP rehab admissions - The denial has been overturned and we now have approval for CIR admission.  Bed available and will admit to CIR today.  Call me for questions.  [phone]

## 2021-05-02 NOTE — Progress Notes (Signed)
Inpatient Rehabilitation Care Coordinator Assessment and Plan Patient Details  Name: Rita Yoder MRN: [id number] Date of Birth: [birth year]  Today's Date: 05/02/2021  Hospital Problems: Principal Problem:   Subcortical infarction [facility])  Past Medical History:  Past Medical History:  Diagnosis Date   Chronic kidney disease 12/2009   Diabetes mellitus 2010   Dyslipidemia    Hypertension    Monoclonal gammopathy 03/2010   Past Surgical History:  Past Surgical History:  Procedure Laterality Date   CATARACT EXTRACTION Bilateral 12/2020   CHOLECYSTECTOMY     HERNIA REPAIR     ROTATOR CUFF REPAIR  03/10/1992   THYROID SURGERY     TONSILLECTOMY     TOTAL ABDOMINAL HYSTERECTOMY     Social History:  reports that she has never smoked. She has never used smokeless tobacco. She reports that she does not drink alcohol and does not use drugs.  Family / Support Systems Marital Status: Widow/Widower How Long?: Dec 23, 2018 Patient Roles: Parent Spouse/Significant Other: Widowed Children: 4 adult children- Rita Yoder, Rita Yoder, and Rita Yoder. All live locally. Other Supports: Children Anticipated Caregiver: Pt son Rita Yoder and dtr Rita Yoder Ability/Limitations of Caregiver: Pt dtr Rita Yoder works. Pt son Rita Yoder reports he has taken time off to help his mother. Patient will have support from all children. Caregiver Availability: 24/7 Family Dynamics: Pt lives with her dtr Rita Yoder.  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 Education: high school grad Probation officer - How often do you need to have someone help you when you read instructions, pamphlets, or other written material from your doctor or pharmacy?: Never Writes: Yes Employment Status: Retired Public relations account executive Issues: Denies Guardian/Conservator: N/A   Abuse/Neglect Abuse/Neglect Assessment Can Be Completed:  Yes Physical Abuse: Denies Verbal Abuse: Denies Sexual Abuse: Denies Exploitation of patient/patient's resources: Denies Self-Neglect: Denies  Patient response to: Social Isolation - How often do you feel lonely or isolated from those around you?: Never  Emotional Status Pt's affect, behavior and adjustment status: Pt in good spirits at time of visit. Pt continues to struggle with ability to coomunicate. Patient reports she would like to get better and be better than normal. Recent Psychosocial Issues: Denies Psychiatric History: Denies Substance Abuse History: Denies  Patient / Family Perceptions, Expectations & Goals Pt/Family understanding of illness & functional limitations: Pt and family have a general understanding of pt care needs Premorbid pt/family roles/activities: Independent Anticipated changes in roles/activities/participation: Assistance with ADLs/IADLs Pt/family expectations/goals: pt goal is to learn how to to better than normal routine. Family's goal is for their mother to be able to do for herself such as selfcare and dress self, prepare breakfast if she likes, full mobility and ability to walk, and communication.  Community Resources [facility]s: None Premorbid Home Care/DME Agencies: None Transportation available at discharge: TBD Is the patient able to respond to transportation needs?: Yes In the past 12 months, has lack of transportation kept you from medical appointments or from getting medications?: No In the past 12 months, has lack of transportation kept you from meetings, work, or from getting things needed for daily living?: No Resource referrals recommended: Neuropsychology  Discharge Planning Living Arrangements: Children Support Systems: Children Type of Residence: Private residence Insurance Resources: Multimedia programmer (specify) ([employer] Medicare) Financial Resources: Social Security, Family Support Financial Screen Referred: No Living  Expenses: Own Money Management: Other (Comment) (DIL manages finances) Does the patient have any problems obtaining  your medications?: No Home Management: Pt children helped help with preparing meals, and children help with cleaning the home, and lanscaping. Also reports pt was still making Sunday dinner. Patient/Family Preliminary Plans: Children will step in to assist. Care Coordinator Anticipated Follow Up Needs: HH/OP  Clinical Impression SW met with pt and pt son Rita Yoder in room to introduce self, explain role, and discuss discharge process. Patient is not a English as a second language teacher. No HCPOA. POA- son Rita Yoder. DME: will be verified by the family.   Rita Yoder A Rita Yoder 05/02/2021, 3:20 PM

## 2021-05-02 NOTE — Discharge Instructions (Addendum)
Inpatient Rehab Discharge Instructions  Tiannah Greenly Discharge date and time: No discharge date for patient encounter.   Activities/Precautions/ Functional Status: Activity: activity as tolerated Diet: regular diet Wound Care: Routine skin checks Functional status:  ___ No restrictions     ___ Walk up steps independently ___ 24/7 supervision/assistance   ___ Walk up steps with assistance ___ Intermittent supervision/assistance  ___ Bathe/dress independently ___ Walk with walker     _x__ Bathe/dress with assistance ___ Walk Independently    ___ Shower independently ___ Walk with assistance    ___ Shower with assistance ___ No alcohol     ___ Return to work/school ________   COMMUNITY REFERRALS UPON DISCHARGE:    Home Health:   PT      OT      ST        SNA                    Agency: [facility]  Phone: [phone] *Please expect follow-up within 2-3 days from discharge to schedule your home visit. If you have not received follow-up, be sure to contact the branch directly.*    Medical Equipment/Items Ordered:                                                  Agency/Supplier:     Special Instructions: No driving smoking or alcoholSTROKE/TIA DISCHARGE INSTRUCTIONS SMOKING Cigarette smoking nearly doubles your risk of having a stroke & is the single most alterable risk factor  If you smoke or have smoked in the last 12 months, you are advised to quit smoking for your health. Most of the excess cardiovascular risk related to smoking disappears within a year of stopping. Ask you doctor about anti-smoking medications [place] Quit Line: 1-800-QUIT NOW Free Smoking Cessation Classes [phone]  CHOLESTEROL Know your levels; limit fat & cholesterol in your diet  Lipid Panel     Component Value Date/Time   CHOL 130 04/24/2021 0258   CHOL 162 02/28/2021 1104   TRIG 90 04/24/2021 0258   HDL 34 (L) 04/24/2021 0258   HDL 48 02/28/2021 1104   CHOLHDL 3.8 04/24/2021 0258    VLDL 18 04/24/2021 0258   LDLCALC 78 04/24/2021 0258   LDLCALC 91 02/28/2021 1104     Many patients benefit from treatment even if their cholesterol is at goal. Goal: Total Cholesterol (CHOL) less than 160 Goal:  Triglycerides (TRIG) less than 150 Goal:  HDL greater than 40 Goal:  LDL (LDLCALC) less than 100   BLOOD PRESSURE American Stroke Association blood pressure target is less that 120/80 mm/Hg  Your discharge blood pressure is:    Monitor your blood pressure Limit your salt and alcohol intake Many individuals will require more than one medication for high blood pressure  DIABETES (A1c is a blood sugar average for last 3 months) Goal HGBA1c is under 7% (HBGA1c is blood sugar average for last 3 months)  Diabetes:    Lab Results  Component Value Date   HGBA1C 4.5 (L) 04/24/2021    Your HGBA1c can be lowered with medications, healthy diet, and exercise. Check your blood sugar as directed by your physician Call your physician if you experience unexplained or low blood sugars.  PHYSICAL ACTIVITY/REHABILITATION Goal is 30 minutes at least 4 days per week  Activity: Increase activity  slowly, Therapies: Physical Therapy: Home Health Return to work:  Activity decreases your risk of heart attack and stroke and makes your heart stronger.  It helps control your weight and blood pressure; helps you relax and can improve your mood. Participate in a regular exercise program. Talk with your doctor about the best form of exercise for you (dancing, walking, swimming, cycling).  DIET/WEIGHT Goal is to maintain a healthy weight  Your discharge diet is:  Diet Order     None       liquids Your height is:    Your current weight is:   Your Body Mass Index (BMI) is:    Following the type of diet specifically designed for you will help prevent another stroke. Your goal weight range is:   Your goal Body Mass Index (BMI) is 19-24. Healthy food habits can help reduce 3 risk factors for  stroke:  High cholesterol, hypertension, and excess weight.  RESOURCES Stroke/Support Group:  Call [phone]   STROKE EDUCATION PROVIDED/REVIEWED AND GIVEN TO PATIENT Stroke warning signs and symptoms How to activate emergency medical system (call 911). Medications prescribed at discharge. Need for follow-up after discharge. Personal risk factors for stroke. Pneumonia vaccine given: No Flu vaccine given: No My questions have been answered, the writing is legible, and I understand these instructions.  I will adhere to these goals & educational materials that have been provided to me after my discharge from the hospital.       My questions have been answered and I understand these instructions. I will adhere to these goals and the provided educational materials after my discharge from the hospital.  Patient/Caregiver Signature _______________________________ Date __________  Clinician Signature _______________________________________ Date __________  Please bring this form and your medication list with you to all your follow-up doctor's appointments.    ------------------------------------------------------------------------------------------------------------------------------------------------------------------------- Information on my medicine - ELIQUIS (apixaban)  This medication education was reviewed with me or my healthcare representative as part of my discharge preparation.  Why was Eliquis prescribed for you? Eliquis was prescribed for you to reduce the risk of a blood clot forming that can cause a stroke if you have a medical condition called atrial fibrillation (a type of irregular heartbeat).  What do You need to know about Eliquis ? Take your Eliquis TWICE DAILY - one tablet in the morning and one tablet in the evening with or without food. If you have difficulty swallowing the tablet whole please discuss with your pharmacist how to take the medication  safely.  Take Eliquis exactly as prescribed by your doctor and DO NOT stop taking Eliquis without talking to the doctor who prescribed the medication.  Stopping may increase your risk of developing a stroke.  Refill your prescription before you run out.  After discharge, you should have regular check-up appointments with your healthcare provider that is prescribing your Eliquis.  In the future your dose may need to be changed if your kidney function or weight changes by a significant amount or as you get older.  What do you do if you miss a dose? If you miss a dose, take it as soon as you remember on the same day and resume taking twice daily.  Do not take more than one dose of ELIQUIS at the same time to make up a missed dose.  Important Safety Information A possible side effect of Eliquis is bleeding. You should call your healthcare provider right away if you experience any of the following: Bleeding from an injury or  your nose that does not stop. Unusual colored urine (red or dark brown) or unusual colored stools (red or black). Unusual bruising for unknown reasons. A serious fall or if you hit your head (even if there is no bleeding).  Some medicines may interact with Eliquis and might increase your risk of bleeding or clotting while on Eliquis. To help avoid this, consult your healthcare provider or pharmacist prior to using any new prescription or non-prescription medications, including herbals, vitamins, non-steroidal anti-inflammatory drugs (NSAIDs) and supplements.  This website has more information on Eliquis (apixaban): http://[email or web address].[email or web address]/home

## 2021-05-02 NOTE — Evaluation (Signed)
Occupational Therapy Assessment and Plan  Patient Details  Name: Rita Yoder MRN: [id number] Date of Birth: [birth year]  OT Diagnosis: abnormal posture, apraxia, cognitive deficits, disturbance of vision, hemiplegia affecting dominant side, muscle weakness (generalized), and swelling of limb Rehab Potential: Rehab Potential (ACUTE ONLY): Fair ELOS: 3.5 to 4 weeks   Today's Date: 05/03/2021 OT Individual Time: 0900-1002 OT Individual Time Calculation (min): 62 min     Hospital Problem: Principal Problem:   Subcortical infarction [facility])   Past Medical History:  Past Medical History:  Diagnosis Date   Chronic kidney disease 12/2009   Diabetes mellitus 2010   Dyslipidemia    Hypertension    Monoclonal gammopathy 03/2010   Past Surgical History:  Past Surgical History:  Procedure Laterality Date   CATARACT EXTRACTION Bilateral 12/2020   CHOLECYSTECTOMY     HERNIA REPAIR     ROTATOR CUFF REPAIR  03/10/1992   THYROID SURGERY     TONSILLECTOMY     TOTAL ABDOMINAL HYSTERECTOMY      Assessment & Plan Clinical Impression: Rita Yoder is an 81 year old right-handed female with history of nonobstructive CAD, hypertension, hypothyroidism, type II diabetes mellitus, hyperlipidemia, normocytic anemia/monoclonal gammopathy followed by Dr. Irene Limbo, CKD stage IIIb- but GFR is 20.  Per chart review independent prior to admission occasional straight point cane due to left hip pain.  1 level home with level entry.  She has a son and daughter with good support.  Presented 04/23/2021 with acute onset of right side weakness and word finding difficulties.  Cranial CT scan showed 8 mm hypodensity left thalamus compatible with infarct of indeterminate age.  No acute hemorrhage.  MRA head and neck no intracranial large vessel occlusion identified.  Multiple sites of moderate/severe stenosis within the P2 left PCA.  Severe stenosis within the mid to distal M1 left MCA.  MRI follow-up small patchy  acute cortical subcortical infarct within the left parietal lobe.  Background moderate to moderately advanced chronic small vessel ischemic changes within the cerebral white matter additionally, clustered chronic parenchymal microhemorrhages present within the right parieto-occipital lobes.  Patient did not receive tPA.  Admission chemistries unremarkable except chloride 113 glucose 160 BUN 49 creatinine 3.13, hemoglobin 9.7, alcohol negative, hemoglobin A1c 4.5, TSH 5.275.  Echocardiogram ejection fraction of 60 to 65% no wall motion abnormalities grade 1 diastolic dysfunction.  Neurology follow-up due to concerns for microhemorrhages DAPT not recommended and initially placed on aspirin alone.  Patient did develop some right hand twitching concern for focal seizure loaded with Keppra with resolution of tremor.  EEG not performed due to glued wig in place.  Hospital course PAF with RVR received IV metoprolol and subsequently had episode of bradycardia with heart rate in the 50s with cardiology services consulted.  She was cleared to begin Eliquis for both atrial fibrillation and CVA prophylaxis and initial aspirin discontinued.  Cardiac rate remains controlled maintained on amiodarone 100 mg daily.  AKI on CKD with baseline creatinine 2.8 her HCTZ was on hold.  Noted mild leukocytosis 12,500 with urinalysis negative nitrite.  Patient was placed on Keflex empirically.  Tolerating a regular consistency diet.  Therapy evaluations completed due to patient decreased functional mobility right side weakness and word finding difficulties was admitted for a comprehensive rehab program.  Patient currently requires total with basic self-care skills secondary to muscle weakness and muscle paralysis, decreased cardiorespiratoy endurance, unbalanced muscle activation, motor apraxia, decreased coordination, and decreased motor planning, decreased visual motor skills, decreased attention to right and  decreased motor planning,  decreased initiation, decreased attention, decreased awareness, decreased problem solving, and decreased safety awareness, and decreased postural control and hemiplegia.  Prior to hospitalization, patient could complete BADLs with modified independent .  Patient will benefit from skilled intervention to increase independence with basic self-care skills prior to discharge  home with family support .  Anticipate patient will require 24 hour supervision and minimal physical assistance and follow up home health.  OT - End of Session Endurance Deficit: Yes OT Assessment Rehab Potential (ACUTE ONLY): Fair OT Barriers to Discharge: Incontinence;Insurance for SNF coverage;Weight;Nutrition means OT Patient demonstrates impairments in the following area(s): Balance;Cognition;Vision;Edema;Endurance;Motor;Pain;Safety;Perception OT Basic ADL's Functional Problem(s): Grooming;Bathing;Dressing;Toileting;Eating OT Advanced ADL's Functional Problem(s): Simple Meal Preparation OT Transfers Functional Problem(s): Toilet;Tub/Shower OT Additional Impairment(s): Fuctional Use of Upper Extremity OT Plan OT Intensity: Minimum of 1-2 x/day, 45 to 90 minutes OT Frequency: 5 out of 7 days OT Duration/Estimated Length of Stay: 3.5 to 4 weeks OT Treatment/Interventions: Balance/vestibular training;Disease mangement/prevention;Neuromuscular re-education;Self Care/advanced ADL retraining;Therapeutic Exercise;Wheelchair propulsion/positioning;Pain management;DME/adaptive equipment instruction;Cognitive remediation/compensation;UE/LE Strength taining/ROM;Community reintegration;Patient/family education;UE/LE Coordination activities;Splinting/orthotics;Functional electrical stimulation;Discharge planning;Psychosocial support;Functional mobility training;Therapeutic Activities;Visual/perceptual remediation/compensation OT Self Feeding Anticipated Outcome(s): No goal OT Basic Self-Care Anticipated Outcome(s): Min A OT Toileting  Anticipated Outcome(s): Min A OT Bathroom Transfers Anticipated Outcome(s): Min A OT Recommendation Recommendations for Other Services: Other (comment) (n/a) Follow Up Recommendations: 24 hour supervision/assistance Equipment Recommended: To be determined  OT Evaluation Precautions/Restrictions  Precautions Precautions: Fall Precaution Comments: Rt hemiparesis and Rt inattention General   Vital Signs  Pain   Home Living/Prior Functioning Home Living Family/patient expects to be discharged to:: Private residence Living Arrangements: Children Available Help at Discharge: Family, Available 24 hours/day Type of Home: House Home Access: Level entry Home Layout: One level Bathroom Shower/Tub: Chiropodist: Standard  Lives With: Son IADL History Occupation: Retired Type of Occupation: 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 Prior Function Level of Independence: Independent with basic ADLs, Requires assistive device for independence (per granddaughter, pt was Mod I with ADLs using rollator) Driving: No Vision Ability to See in Adequate Light: 3 Highly impaired Alignment/Gaze Preference: Gaze left Tracking/Visual Pursuits: Impaired - to be further tested in functional context Perception  Perception: Impaired Inattention/Neglect: Does not attend to right side of body;Does not attend to right visual field Praxis Praxis: Impaired Praxis Impairment Details: Initiation;Ideomotor;Motor planning Cognition Arousal/Alertness: Awake/alert Orientation Level: Situation;Place;Person (when given options of 2) Person: Oriented Place: Oriented Situation: Oriented Year: Other (Comment) (pt responding "I don't know" and would not guess a year) Month:  (pt reporting "I don't know and would not guess" said the season was "summer") Day of Week: Incorrect Immediate Memory Recall: Blue;Bed Memory Recall Sock: Not able to recall Memory Recall Blue:  Not able to recall Memory Recall Bed: Not able to recall Attention: Sustained Sustained Attention: Impaired Awareness: Impaired Awareness Impairment: Intellectual impairment Problem Solving: Impaired Sensation Sensation Light Touch: Not tested Coordination Gross Motor Movements are Fluid and Coordinated: No Fine Motor Movements are Fluid and Coordinated: No Coordination and Movement Description: Impaired due to Rt hemiparesis Finger Nose Finger Test: Unable to complete with max cues and hand over hand assist to see carryover of understanding Motor  Motor Motor: Hemiplegia;Motor apraxia;Abnormal postural alignment and control  Trunk/Postural Assessment  Cervical Assessment Cervical Assessment: Exceptions to [facility] (forward head) Thoracic Assessment Thoracic Assessment: Exceptions to [facility] (kyphotic) Lumbar Assessment Lumbar Assessment: Exceptions to [facility] (posterior pelvic tilt) Postural Control Postural Control: Deficits on evaluation (decreased during functional  bed mobility)  Balance Balance Balance Assessed: Yes Dynamic Sitting Balance Dynamic Sitting - Balance Support: During functional activity Dynamic Sitting - Level of Assistance: 1: +1 Total assist (scooting EOB to engage in ADL tasks) Extremity/Trunk Assessment RUE Assessment RUE Assessment: Exceptions to [facility] RUE Body System: Neuro Brunstrum levels for arm and hand: Arm;Hand Brunstrum level for arm: Stage II Synergy is developing Brunstrum level for hand: Stage I Flaccidity RUE Tone RUE Tone: Hypotonic LUE Assessment LUE Assessment: Within Functional Limits  Care Tool Care Tool Self Care Eating    Not assesed    Oral Care  Oral care, brush teeth, clean dentures activity did not occur:  (not assessed due to time constraints)      Bathing     Body parts bathed by helper: Right arm;Left arm;Chest;Abdomen;Front perineal area;Buttocks;Right upper leg;Left upper leg;Left lower leg;Right lower leg;Face   Assist  Level: 2 Helpers    Upper Body Dressing(including orthotics)   What is the patient wearing?: Dress   Assist Level: 2 Helpers    Lower Body Dressing (excluding footwear)   What is the patient wearing?: Incontinence brief Assist for lower body dressing: 2 Helpers    Putting on/Taking off footwear   What is the patient wearing?: Non-skid slipper socks;Ted hose Assist for footwear: Dependent - Patient 0%       Care Tool Toileting Toileting activity   Assist for toileting: 2 Helpers       Toilet transfer Toilet transfer activity did not occur: N/A (not attempted due to time constraints)        Refer to Care Plan for Long Term Goals  SHORT TERM GOAL WEEK 1 OT Short Term Goal 1 (Week 1): Pt will complete BSC trasnfer with +2 assist and LRAD OT Short Term Goal 2 (Week 1): Pt will complete LB dressing at sit<stand level with LRAD OT Short Term Goal 3 (Week 1): Pt will complete 1 component of UB dressing with Max A  Recommendations for other services: None    Skilled Therapeutic Intervention Skilled OT session completed with focus on initial evaluation, education on OT role/POC, and establishment of patient-centered goals.  Pt greeted in bed with no c/o pain. Family present, granddaughter Aniceto Boss especially helpful in providing pts PLOF information, pt with cognitive/word finding difficulties throughout session and motor apraxia. +2 for supine<sit due to motor planning deficits + Rt hemiparesis. Pt unable to follow 1 step instruction without hand over hand assist (when using either Lt or Rt UE). Noted increased swelling in the Rt hand. Pt able to minimally abduct shoulder and also initiate ROM into scaption plane. Supervision for sitting balance. OT brought in bariatric Stedy to try sit<stand for pericare with pt refusing, initiating transfer back to bed. Per granddaughter, when pt "taps out" or becomes excessively tired, she will stop trying. Therefore pericare/brief change completed  bedlevel with +2 assist. She remained in bed at close of session, all needs within reach and bed alarm set.   ADL ADL Eating: Supervision/safety Grooming: Maximal assistance Where Assessed-Grooming: Edge of bed Upper Body Bathing: Dependent Where Assessed-Upper Body Bathing: Edge of bed Lower Body Bathing: Dependent Where Assessed-Lower Body Bathing: Bed level Upper Body Dressing: Dependent Lower Body Dressing: Dependent Where Assessed-Lower Body Dressing: Bed level Toileting: Dependent Where Assessed-Toileting: Bed level Toilet Transfer: Not assessed Tub/Shower Transfer: Not assessed ADL Comments: Pt dependent +2 for LB self care and toileting Mobility  Bed Mobility Bed Mobility: Rolling Right;Rolling Left Rolling Right: Dependent - Patient equal 0% Rolling Left:  Dependent - Patient equal 0%   Discharge Criteria: Patient will be discharged from OT if patient refuses treatment 3 consecutive times without medical reason, if treatment goals not met, if there is a change in medical status, if patient makes no progress towards goals or if patient is discharged from hospital.  The above assessment, treatment plan, treatment alternatives and goals were discussed and mutually agreed upon: by patient  Skeet Simmer 05/03/2021, 12:47 PM

## 2021-05-02 NOTE — H&P (Signed)
Physical Medicine and Rehabilitation Admission H&P        Chief Complaint  Patient presents with   Code Stroke  : HPI: Rita Yoder. Babson is an 81 year old right-handed female with history of nonobstructive CAD, hypertension, hypothyroidism, type II diabetes mellitus, hyperlipidemia, normocytic anemia/monoclonal gammopathy followed by Dr. Irene Limbo, CKD stage IIIb- but GFR is 20.  Per chart review independent prior to admission occasional straight point cane due to left hip pain.  1 level home with level entry.  She has a son and daughter with good support.  Presented 04/23/2021 with acute onset of right side weakness and word finding difficulties.  Cranial CT scan showed 8 mm hypodensity left thalamus compatible with infarct of indeterminate age.  No acute hemorrhage.  MRA head and neck no intracranial large vessel occlusion identified.  Multiple sites of moderate/severe stenosis within the P2 left PCA.  Severe stenosis within the mid to distal M1 left MCA.  MRI follow-up small patchy acute cortical subcortical infarct within the left parietal lobe.  Background moderate to moderately advanced chronic small vessel ischemic changes within the cerebral white matter additionally, clustered chronic parenchymal microhemorrhages present within the right parieto-occipital lobes.  Patient did not receive tPA.  Admission chemistries unremarkable except chloride 113 glucose 160 BUN 49 creatinine 3.13, hemoglobin 9.7, alcohol negative, hemoglobin A1c 4.5, TSH 5.275.  Echocardiogram ejection fraction of 60 to 65% no wall motion abnormalities grade 1 diastolic dysfunction.  Neurology follow-up due to concerns for microhemorrhages DAPT not recommended and initially placed on aspirin alone.  Patient did develop some right hand twitching concern for focal seizure loaded with Keppra with resolution of tremor.  EEG not performed due to glued wig in place.  Hospital course PAF with RVR received IV metoprolol and subsequently  had episode of bradycardia with heart rate in the 50s with cardiology services consulted.  She was cleared to begin Eliquis for both atrial fibrillation and CVA prophylaxis and initial aspirin discontinued.  Cardiac rate remains controlled maintained on amiodarone 100 mg daily.  AKI on CKD with baseline creatinine 2.8 her HCTZ was on hold.  Noted mild leukocytosis 12,500 with urinalysis negative nitrite.  Patient was placed on Keflex empirically.  Tolerating a regular consistency diet.  Therapy evaluations completed due to patient decreased functional mobility right side weakness and word finding difficulties was admitted for a comprehensive rehab program.     Pt says has no pain, but then moves hand to abdomen- when asked if her stomach hurts, she did say yes.  Hasn't had a BM lately per son.  Denies MS pain.  Voiding OK per nursing.      Review of Systems  Constitutional:  Negative for chills and fever.  HENT:  Negative for hearing loss.   Eyes:  Negative for blurred vision and double vision.  Respiratory:  Negative for cough and shortness of breath.   Cardiovascular:  Positive for leg swelling. Negative for chest pain and palpitations.  Gastrointestinal:  Positive for constipation. Negative for heartburn, nausea and vomiting.  Genitourinary:  Negative for dysuria, flank pain and hematuria.  Musculoskeletal:  Positive for joint pain and myalgias.  Skin:  Negative for rash.  Neurological:  Positive for speech change and weakness.  All other systems reviewed and are negative.     Past Medical History:  Diagnosis Date   Chronic kidney disease 12/2009   Diabetes mellitus 2010   Dyslipidemia     Hypertension     Monoclonal gammopathy 03/2010  Past Surgical History:  Procedure Laterality Date   CATARACT EXTRACTION Bilateral 12/2020   CHOLECYSTECTOMY       HERNIA REPAIR       ROTATOR CUFF REPAIR   03/10/1992   THYROID SURGERY       TONSILLECTOMY       TOTAL ABDOMINAL  HYSTERECTOMY             Family History  Problem Relation Age of Onset   Healthy Mother     Heart attack Brother     Cancer Maternal Aunt     Cancer Maternal Uncle     Heart Problems Maternal Grandmother      Social History:  reports that she has never smoked. She has never used smokeless tobacco. She reports that she does not drink alcohol and does not use drugs. Allergies:       Allergies  Allergen Reactions   Losartan Other (See Comments)      MD discontinued this because of decreased kidney function          Medications Prior to Admission  Medication Sig Dispense Refill   acetaminophen (TYLENOL) 500 MG tablet Take 500 mg by mouth every 6 (six) hours as needed for headache or mild pain.       atorvastatin (LIPITOR) 20 MG tablet TAKE 1 TABLET BY MOUTH ONCE DAILY (Patient taking differently: Take 20 mg by mouth daily.) 90 tablet 3   Cholecalciferol (VITAMIN D3) 50 MCG (2000 UT) capsule Take 2,000 Units by mouth daily.       hydrochlorothiazide (MICROZIDE) 12.5 MG capsule TAKE 1 CAPSULE BY MOUTH  DAILY (Patient taking differently: Take 12.5 mg by mouth daily.) 90 capsule 3   isosorbide mononitrate (IMDUR) 30 MG 24 hr tablet TAKE 1 TABLET(30 MG) BY MOUTH DAILY (Patient taking differently: Take 30 mg by mouth daily.) 90 tablet 1   levothyroxine (SYNTHROID) 25 MCG tablet TAKE 1 TABLET(25 MCG) BY MOUTH EVERY MORNING (Patient taking differently: Take 25 mcg by mouth daily before breakfast.) 90 tablet 0   nitroGLYCERIN (NITROSTAT) 0.4 MG SL tablet DISSOLVE 1 TABLET UNDER THE TONGUE AT FIRST SIGN OF ATTACKS, MAY REPEAT EVERY 5 MINUTES UP TO 3 TABLETS, IF NO RELLIEF SEEK MEDICAL HELP (Patient taking differently: Place 0.4 mg under the tongue every 5 (five) minutes x 3 doses as needed for chest pain (SEEK HELP, IF NO RELIEF).) 25 tablet 1   solifenacin (VESICARE) 5 MG tablet Take 5 mg by mouth daily.       Blood Glucose Calibration (OT ULTRA/FASTTK CNTRL SOLN) SOLN                 Home: Home Living Family/patient expects to be discharged to:: Private residence Living Arrangements: Children Available Help at Discharge: Family, Available 24 hours/day Type of Home: House Home Access: Level entry Home Layout: One level Bathroom Shower/Tub: Chiropodist: Standard Home Equipment: Cane - single point, Conservation officer, nature (2 wheels) Additional Comments: has been walking unassisted at times and at times with AD  Lives With: Family   Functional History: Prior Function Prior Level of Function : Needs assist Physical Assist : Mobility (physical) Mobility (physical): Gait Mobility Comments: using a SPC or RW depending on her L hip pain ADLs Comments: indep   Functional Status:  Mobility: Bed Mobility Overal bed mobility: Needs Assistance Bed Mobility: Supine to Sit Supine to sit: HOB elevated, Max assist, +2 for physical assistance Sit to supine: Mod assist General bed mobility comments: requires assistance with  BLEs and trunk elevation with verbal cues to use LUE to assist Transfers Overall transfer level: Needs assistance Equipment used: 2 person hand held assist (with use of bed pad and gait belt with face to face transfer) Transfers: Sit to/from Stand, Bed to chair/wheelchair/BSC Sit to Stand: Mod assist, +2 physical assistance Bed to/from chair/wheelchair/BSC transfer type:: Step pivot Step pivot transfers: Mod assist, Max assist, +2 physical assistance Transfer via Lift Equipment: Stedy General transfer comment: Assist of 2 to power up and weight shift to perform step pivot transfer into recliner Ambulation/Gait General Gait Details: pt unable at this time   ADL: ADL Overall ADL's : Needs assistance/impaired Grooming: Wash/dry hands, Minimal assistance, Moderate assistance Grooming Details (indicate cue type and reason): performed face washing on EOB with LUE and min verbal cues, mod assist and assistance with LUE to use RUE.  Attempted  to use RUE while standing at sink with max assist Upper Body Bathing: Moderate assistance, Sitting Upper Body Bathing Details (indicate cue type and reason): assistance for back and to LUE Lower Body Bathing: Maximal assistance, Sit to/from stand Lower Body Bathing Details (indicate cue type and reason): patient able to assist with bathing legs seated and max assist when standing in Stedy Upper Body Dressing : Maximal assistance Upper Body Dressing Details (indicate cue type and reason): donned gown seated on EOB General ADL Comments: grooming performed with attempts to increase RUE use and right visual field scanning   Cognition: Cognition Overall Cognitive Status: Impaired/Different from baseline Arousal/Alertness: Awake/alert Orientation Level: Oriented to person, Oriented to place Attention: Focused Focused Attention: Appears intact Cognition Arousal/Alertness: Awake/alert Behavior During Therapy: Flat affect Overall Cognitive Status: Impaired/Different from baseline Area of Impairment: Problem solving, Awareness, Following commands, Attention, Orientation, Safety/judgement Orientation Level: Situation, Time Current Attention Level: Sustained Following Commands: Follows one step commands inconsistently, Follows one step commands with increased time Safety/Judgement: Decreased awareness of safety, Decreased awareness of deficits Awareness: Intellectual Problem Solving: Slow processing, Requires verbal cues, Requires tactile cues, Difficulty sequencing, Decreased initiation General Comments: Patient  continues to demonstrate severe right neglect and requires verbal and tactile cues to tend to R UE and LE, unable to track past midline to the R   Physical Exam: Blood pressure (!) 161/56, pulse 61, temperature 97.6 F (36.4 C), temperature source Oral, resp. rate 20, height 5\' 6"  (1.676 m), weight 95.1 kg, SpO2 100 %. Physical Exam Vitals and nursing note reviewed. Exam conducted with  a chaperone present.  Constitutional:      Appearance: She is obese.     Comments: Pt appears stated age; sitting up in bed; nursing at bedside as well as son; some word finding issues noted, NAD  HENT:     Head: Normocephalic and atraumatic.     Comments: No facial droop seen at rest however has R tongue deviation    Right Ear: External ear normal.     Left Ear: External ear normal.     Nose: Nose normal. No congestion.     Mouth/Throat:     Mouth: Mucous membranes are dry.     Pharynx: Oropharynx is clear. No oropharyngeal exudate.  Eyes:     General:        Right eye: No discharge.        Left eye: No discharge.     Conjunctiva/sclera: Conjunctivae normal.     Comments: L gaze preference Unable to look R past midline   Cardiovascular:     Rate and Rhythm: Normal rate and  regular rhythm.     Heart sounds: Normal heart sounds. No murmur heard.   No gallop.  Pulmonary:     Effort: Pulmonary effort is normal. No respiratory distress.     Breath sounds: Normal breath sounds. No wheezing or rales.  Abdominal:     General: There is distension.     Palpations: Abdomen is soft.     Tenderness: There is no abdominal tenderness. There is no guarding.     Comments: Hypoactive BS  Musculoskeletal:     Cervical back: Neck supple. No tenderness.     Comments: RUE 4-/5 but hard to test due to R sided neglect RLE 3+/5- again neglect makes it difficult to test LUE/LLE 5-/5   L lean in bed  Skin:    General: Skin is warm and dry.     Comments: L forearm-looks OK  Neurological:     Mental Status: She is alert.     Comments: Patient is alert.  Right-sided neglect.  She is aphasic she could utter some simple words Pt has some aphasia/word finding issues- could answer at word level L gaze preference  Michela Pitcher was [id number]- when given choices was at [place] [facility]- out of 3 hospitals- she knew was in hospital Difficulty naming 3 objects- better with cues.  Psychiatric:     Comments: Flat, but  interactive      Lab Results Last 48 Hours        Results for orders placed or performed during the hospital encounter of 04/23/21 (from the past 48 hour(s))  Glucose, capillary     Status: Abnormal    Collection Time: 04/30/21  9:00 AM  Result Value Ref Range    Glucose-Capillary 121 (H) 70 - 99 mg/dL      Comment: Glucose reference range applies only to samples taken after fasting for at least 8 hours.    Comment 1 Notify RN      Comment 2 Document in Chart    Glucose, capillary     Status: Abnormal    Collection Time: 04/30/21 11:13 AM  Result Value Ref Range    Glucose-Capillary 125 (H) 70 - 99 mg/dL      Comment: Glucose reference range applies only to samples taken after fasting for at least 8 hours.    Comment 1 Notify RN      Comment 2 Document in Chart    Glucose, capillary     Status: Abnormal    Collection Time: 04/30/21  3:13 PM  Result Value Ref Range    Glucose-Capillary 127 (H) 70 - 99 mg/dL      Comment: Glucose reference range applies only to samples taken after fasting for at least 8 hours.    Comment 1 Notify RN      Comment 2 Document in Chart    Glucose, capillary     Status: Abnormal    Collection Time: 04/30/21  9:13 PM  Result Value Ref Range    Glucose-Capillary 108 (H) 70 - 99 mg/dL      Comment: Glucose reference range applies only to samples taken after fasting for at least 8 hours.  TSH     Status: Abnormal    Collection Time: 05/01/21 12:52 AM  Result Value Ref Range    TSH 5.819 (H) 0.350 - 4.500 uIU/mL      Comment: Performed by a 3rd Generation assay with a functional sensitivity of <=0.01 uIU/mL. Performed at [facility], [place] [address], [place], [place]  [address]    Glucose, capillary     Status: Abnormal    Collection Time: 05/01/21  6:09 AM  Result Value Ref Range    Glucose-Capillary 119 (H) 70 - 99 mg/dL      Comment: Glucose reference range applies only to samples taken after fasting for at least 8 hours.  Glucose, capillary      Status: Abnormal    Collection Time: 05/01/21  7:30 AM  Result Value Ref Range    Glucose-Capillary 115 (H) 70 - 99 mg/dL      Comment: Glucose reference range applies only to samples taken after fasting for at least 8 hours.  Basic metabolic panel     Status: Abnormal    Collection Time: 05/01/21  8:20 AM  Result Value Ref Range    Sodium 133 (L) 135 - 145 mmol/L    Potassium 4.7 3.5 - 5.1 mmol/L    Chloride 102 98 - 111 mmol/L    CO2 20 (L) 22 - 32 mmol/L    Glucose, Bld 113 (H) 70 - 99 mg/dL      Comment: Glucose reference range applies only to samples taken after fasting for at least 8 hours.    BUN 59 (H) 8 - 23 mg/dL    Creatinine, Ser 2.83 (H) 0.44 - 1.00 mg/dL    Calcium 8.9 8.9 - 10.3 mg/dL    GFR, Estimated 16 (L) >60 mL/min      Comment: (NOTE) Calculated using the CKD-EPI Creatinine Equation (2021)      Anion gap 11 5 - 15      Comment: Performed at [facility] [address] [place], [place] [address]  CBC     Status: Abnormal    Collection Time: 05/01/21  8:20 AM  Result Value Ref Range    WBC 12.5 (H) 4.0 - 10.5 K/uL    RBC 3.02 (L) 3.87 - 5.11 MIL/uL    Hemoglobin 9.9 (L) 12.0 - 15.0 g/dL    HCT 29.7 (L) 36.0 - 46.0 %    MCV 98.3 80.0 - 100.0 fL    MCH 32.8 26.0 - 34.0 pg    MCHC 33.3 30.0 - 36.0 g/dL    RDW 12.6 11.5 - 15.5 %    Platelets 226 150 - 400 K/uL    nRBC 0.0 0.0 - 0.2 %      Comment: Performed at [facility], [place] [address], [place], [place] [address]  T3, free     Status: Abnormal    Collection Time: 05/01/21  8:20 AM  Result Value Ref Range    T3, Free 1.6 (L) 2.0 - 4.4 pg/mL      Comment: (NOTE) Performed At: [facility] [place], [place] [id number] Rush Farmer MD CW:[id number]    T4, free     Status: None    Collection Time: 05/01/21  8:20 AM  Result Value Ref Range    Free T4 1.04 0.61 - 1.12 ng/dL      Comment: (NOTE) Biotin ingestion may interfere with free T4 tests. If the results  are inconsistent with the TSH level, previous test results, or the clinical presentation, then consider biotin interference. If needed, order repeat testing after stopping biotin. Performed at [facility], [place] [address], [place], [place] [address]    Glucose, capillary     Status: Abnormal    Collection Time: 05/01/21 11:19 AM  Result Value Ref Range    Glucose-Capillary  122 (H) 70 - 99 mg/dL      Comment: Glucose reference range applies only to samples taken after fasting for at least 8 hours.  Glucose, capillary     Status: Abnormal    Collection Time: 05/01/21  4:45 PM  Result Value Ref Range    Glucose-Capillary 205 (H) 70 - 99 mg/dL      Comment: Glucose reference range applies only to samples taken after fasting for at least 8 hours.  Urinalysis, Routine w reflex microscopic Urine, Clean Catch     Status: Abnormal    Collection Time: 05/01/21  7:45 PM  Result Value Ref Range    Color, Urine YELLOW YELLOW    APPearance CLOUDY (A) CLEAR    Specific Gravity, Urine 1.013 1.005 - 1.030    pH 6.0 5.0 - 8.0    Glucose, UA NEGATIVE NEGATIVE mg/dL    Hgb urine dipstick SMALL (A) NEGATIVE    Bilirubin Urine NEGATIVE NEGATIVE    Ketones, ur NEGATIVE NEGATIVE mg/dL    Protein, ur 100 (A) NEGATIVE mg/dL    Nitrite NEGATIVE NEGATIVE    Leukocytes,Ua LARGE (A) NEGATIVE    RBC / HPF 0-5 0 - 5 RBC/hpf    WBC, UA >50 (H) 0 - 5 WBC/hpf    Bacteria, UA MANY (A) NONE SEEN    Squamous Epithelial / LPF 0-5 0 - 5    WBC Clumps PRESENT      Mucus PRESENT        Comment: Performed at [facility], [place] [address], [place], [place] [address]  Glucose, capillary     Status: Abnormal    Collection Time: 05/01/21  9:07 PM  Result Value Ref Range    Glucose-Capillary 122 (H) 70 - 99 mg/dL      Comment: Glucose reference range applies only to samples taken after fasting for at least 8 hours.      Imaging Results (Last 48 hours)  No results found.         Blood pressure (!)  161/56, pulse 61, temperature 97.6 F (36.4 C), temperature source Oral, resp. rate 20, height 5\' 6"  (1.676 m), weight 95.1 kg, SpO2 100 %.   Medical Problem List and Plan: 1. Functional deficits secondary to acute multifocal infarct including left ischemic thalamus, left parietal cortical and subcortical infarcts             -patient may  shower             -ELOS/Goals: 14-16 days supervision 2.  Antithrombotics: -DVT/anticoagulation:  Pharmaceutical: Other (comment) Eliquis             -antiplatelet therapy: N/A 3. Pain Management: Tylenol as needed 4. Mood: Provide emotional support             -antipsychotic agents: N/A 5. Neuropsych: This patient is not capable of making decisions on her own behalf. 6. Skin/Wound Care: Routine skin checks 7. Fluids/Electrolytes/Nutrition: Routine in and outs with follow-up chemistries 8.  New onset atrial fibrillation with RVR.  Amiodarone 100 mg daily.  Follow-up cardiology services.  Cardiac rate controlled 9.  Hypertension.  Norvasc 5 mg daily, Imdur 30 mg daily.  Monitor with increased mobility 10.  Suspicion for focal seizure.  Continue Keppra 500 mg twice daily.  EEG not completed due to glued wig could not be removed. 11.  AKI on CKD stage IIIB- GFR 20.  Creatinine baseline 2.8.  Follow-up chemistries.  HCTZ on hold. 12.  Hyperlipidemia.  Continue Lipitor 13.  Hypothyroidism.  Synthroid 14.  Normocytic anemia/monoclonal gammopathy.  Follow-up hematology services Dr.Kale 15.  Constipation.  MiraLAX twice daily.  Senokot daily. 16.  Suspect UTI.  Urinalysis negative nitrite.  Empiric Keflex. 17.Type 2 diabetes mellitus.HG  AIC 4.5   SSI discontinued 18. Constipation- will give sorbitol and see if can get her cleaned out.    I have personally performed a face to face diagnostic evaluation of this patient and formulated the key components of the plan.  Additionally, I have personally reviewed laboratory data, imaging studies, as well as relevant  notes and concur with the physician assistant's documentation above.   The patient's status has not changed from the original H&P.  Any changes in documentation from the acute care chart have been noted above.     Lavon Paganini Angiulli, PA-C 05/02/2021

## 2021-05-02 NOTE — Progress Notes (Signed)
Inpatient Rehabilitation Admission Medication Review by a Pharmacist  A complete drug regimen review was completed for this patient to identify any potential clinically significant medication issues.  High Risk Drug Classes Is patient taking? Indication by Medication  Antipsychotic No   Anticoagulant Yes Apixaban- AF  Antibiotic Yes Keflex X3 days- UTI  Opioid No   Antiplatelet No   Hypoglycemics/insulin No   Vasoactive Medication Yes Amiodarone, norvasc, imdur- AF, hypertension  Chemotherapy No   Other Yes Keppra- seizure prophylaxis Lipitor- HLD Synthroid- hypothyroidism Enablex- OAB     Type of Medication Issue Identified Description of Issue Recommendation(s)  Drug Interaction(s) (clinically significant)     Duplicate Therapy     Allergy     No Medication Administration End Date     Incorrect Dose     Additional Drug Therapy Needed     Significant med changes from prior encounter (inform family/care partners about these prior to discharge).    Other  PTA meds: Vitamin D-3 HCTZ NitroSL vesicare Restart PTA meds when and if clinically necessary during CIR admission or at time of discharge  Vesicare was interchanged to Enablex. Please continue Vesicare at time of discharge from CIR and discontinue Enablex    Clinically significant medication issues were identified that warrant physician communication and completion of prescribed/recommended actions by midnight of the next day:  No   Time spent performing this drug regimen review (minutes):  30   Cruzita Lipa BS, PharmD, BCPS Clinical Pharmacist 05/02/2021 12:38 PM

## 2021-05-02 NOTE — Progress Notes (Signed)
Courtney Heys, MD  Physician CASE MANAGEMENT PMR Pre-admission    Signed Date of Service:  04/29/2021  2:14 PM  Related encounter: ED to Hosp-Admission (Discharged) from 04/23/2021 in [facility]   Signed                                                                                                                                                                                                                                                                                                                                                                                                                                                                        PMR Admission Coordinator Pre-Admission Assessment   Patient: Rita Yoder is an 81 y.o., female MRN: [id number] DOB: [birth year] Height: $RemoveBeforeDE'5\' 6"'[id number]$  (167.6 cm) Weight: 95.1 kg   Insurance Information HMO:     PPO:      PCP:      IPA:      80/20:      OTHER:  PRIMARY: U[employer] Medicare      Policy#: [id number]      Subscriber: Pt. CM Name:       Phone#: [phone]     Fax#: [phone] Pre-Cert#: [id number]  from Gilmore Laroche at [facility] for 7 days  with update due on 05/06/21     Employer:  Benefits:  Phone #: [phone]    Name: [email or web address] on line Eff Date: 03/10/2021 - still active Deductible: $0 (does not have deductible) OOP Max: $4,500 ($45 met) CIR: $325/day co-pay for days 1-5, $0/day co-pay for days 6+ SNF: $0/day copay for days 1-20, $196/day copay for days 21-43, $0/day copay for days 44-100; limited to 100 days/cal yr Outpatient: $20/visit co-pay Home Health:  100% coverage, 0% co-insurance; limited by medical necessity DME: 80% coverage; 20% co-insurance Providers: in network  SECONDARY: none      Policy#:      Phone#:      Development worker, community: none      Phone#:     The Actuary for patients in Inpatient Rehabilitation Facilities with attached Privacy Act [place] Records was provided and verbally reviewed with: Patient and Family   Emergency Contact Information Contact Information       Name Relation Home Work Mobile    [place] Son [phone]        Sales,[name] Daughter     [phone]    Nakiesha, Rumsey Daughter     [phone]           Current Medical History  Patient Admitting Diagnosis: L CVA   History of Present Illness: An 81 year old right-handed female with history of nonobstructive CAD, hypertension, hypothyroidism, type II diabetes mellitus, hyperlipidemia, normocytic anemia/monoclonal gammopathy followed by Dr. Irene Limbo, CKD stage III.  Per chart review independent prior to admission occasional straight point cane due to left hip pain.  1 level home with level entry.  She has a son and daughter with good support.  Presented 04/23/2021 with acute onset of right side weakness and word finding difficulties.  Cranial CT scan showed 8 mm hypodensity left thalamus compatible with infarct of indeterminate age.  No acute hemorrhage.  MRA head and neck no intracranial large vessel occlusion identified.  Multiple sites of moderate/severe stenosis within the P2 left PCA.  Severe stenosis within the mid to distal M1 left MCA.  MRI follow-up small patchy acute cortical subcortical infarct within the left parietal lobe.  Background moderate to moderately advanced chronic small vessel ischemic changes within the cerebral white matter additionally, clustered chronic parenchymal microhemorrhages present within the right parieto-occipital lobes.  Patient did not receive tPA.  Admission chemistries unremarkable except chloride 113 glucose 160 BUN 49 creatinine 3.13, hemoglobin 9.7, alcohol negative, hemoglobin A1c 4.5, TSH 5.275.  Echocardiogram ejection fraction of 60 to 65% no wall motion abnormalities grade 1  diastolic dysfunction.  Neurology follow-up due to concerns for microhemorrhages DAPT not recommended and initially placed on aspirin alone.  Patient did develop some right hand twitching concern for focal seizure loaded with Keppra with resolution of tremor.  EEG not performed due to glued wig in place.  Hospital course PAF with RVR received IV metoprolol and subsequently had episode of bradycardia with heart rate in the 50s with cardiology services consulted.  She was cleared to begin Eliquis for both atrial fibrillation and CVA prophylaxis and initial aspirin discontinued.  Cardiac rate remains controlled maintained on amiodarone 100 mg daily.  AKI on CKD with baseline creatinine 2.8 her HCTZ was on hold.  Tolerating a regular consistency diet.  Therapy evaluations completed due to patient decreased functional mobility right side weakness and word finding difficulties.  Patient to be admitted for a comprehensive inpatient rehab program.   Complete NIHSS TOTAL: 9   Patient's medical record from [facility]  has been reviewed by the rehabilitation admission coordinator and physician.   Past Medical History      Past Medical History:  Diagnosis Date   Chronic kidney disease 12/2009   Diabetes mellitus 2010   Dyslipidemia     Hypertension     Monoclonal gammopathy 03/2010      Has the patient had major surgery during 100 days prior to admission? No   Family History   family history includes Cancer in her maternal aunt and maternal uncle; Healthy in her mother; Heart Problems in her maternal grandmother; Heart attack in her brother.   Current Medications   Current Facility-Administered Medications:    acetaminophen (TYLENOL) tablet 500 mg, 500 mg, Oral, Q6H PRN, Wynetta Fines T, MD   amiodarone (PACERONE) tablet 100 mg, 100 mg, Oral, Daily, Harwani, Mohan, MD, 100 mg at 05/02/21 0814   amLODipine (NORVASC) tablet 5 mg, 5 mg, Oral, Daily, Regalado, Belkys A, MD, 5 mg at  05/02/21 0817   apixaban (ELIQUIS) tablet 2.5 mg, 2.5 mg, Oral, BID, Kris Mouton, RPH, 2.5 mg at 05/02/21 0813   atorvastatin (LIPITOR) tablet 40 mg, 40 mg, Oral, Daily, Vance Gather B, MD, 40 mg at 05/02/21 0814   cefTRIAXone (ROCEPHIN) 1 g in sodium chloride 0.9 % 100 mL IVPB, 1 g, Intravenous, Q24H, Regalado, Belkys A, MD, Last Rate: 200 mL/hr at 05/02/21 0946, 1 g at 05/02/21 0946   darifenacin (ENABLEX) 24 hr tablet 7.5 mg, 7.5 mg, Oral, Daily, [place], Ping T, MD, 7.5 mg at 05/02/21 5993   hydrALAZINE (APRESOLINE) tablet 25 mg, 25 mg, Oral, Q6H PRN, Etta Quill, DO, 25 mg at 04/30/21 2012   isosorbide mononitrate (IMDUR) 24 hr tablet 30 mg, 30 mg, Oral, Daily, Vance Gather B, MD, 30 mg at 05/02/21 0814   levETIRAcetam (KEPPRA) tablet 500 mg, 500 mg, Oral, BID, Donnetta Simpers, MD, 500 mg at 05/02/21 5701   levothyroxine (SYNTHROID) tablet 25 mcg, 25 mcg, Oral, Q0600, Wynetta Fines T, MD, 25 mcg at 05/02/21 7793   loperamide (IMODIUM) capsule 2 mg, 2 mg, Oral, PRN, Vance Gather B, MD, 2 mg at 04/26/21 1339   LORazepam (ATIVAN) tablet 0.5 mg, 0.5 mg, Oral, Q4H PRN, Wynetta Fines T, MD   metoprolol tartrate (LOPRESSOR) injection 5 mg, 5 mg, Intravenous, Q5 min PRN, Alcario Drought, Jared M, DO, 5 mg at 04/30/21 0157   polyethylene glycol (MIRALAX / GLYCOLAX) packet 17 g, 17 g, Oral, BID, Regalado, Belkys A, MD, 17 g at 05/02/21 0813   senna (SENOKOT) tablet 8.6 mg, 1 tablet, Oral, Daily, Regalado, Belkys A, MD, 8.6 mg at 05/02/21 9030   Patients Current Diet:  Diet Order                  Diet Heart Room service appropriate? Yes with Assist; Fluid consistency: Thin  Diet effective now                         Precautions / Restrictions Precautions Precautions: Fall Precaution Comments: pt with sever R sided neglect Restrictions Weight Bearing Restrictions: No Other Position/Activity Restrictions: L hip pain from OA    Has the patient had 2 or more falls or a fall with injury in the  past year? No   Prior Activity Level   Prior Functional Level Self Care: Did the patient need help bathing, dressing, using the toilet or eating? Independent   Indoor Mobility: Did the patient need assistance  with walking from room to room (with or without device)? Independent   Stairs: Did the patient need assistance with internal or external stairs (with or without device)? Independent   Functional Cognition: Did the patient need help planning regular tasks such as shopping or remembering to take medications? Independent   Patient Information Are you of Hispanic, Latino/a,or Spanish origin?: A. No, not of Hispanic, Latino/a, or Spanish origin What is your race?: B. Black or African American Do you need or want an interpreter to communicate with a doctor or health care staff?: 0. No   Patient's Response To:  Health Literacy and Transportation Is the patient able to respond to health literacy and transportation needs?: Yes Health Literacy - How often do you need to have someone help you when you read instructions, pamphlets, or other written material from your doctor or pharmacy?: Never In the past 12 months, has lack of transportation kept you from medical appointments or from getting medications?: No In the past 12 months, has lack of transportation kept you from meetings, work, or from getting things needed for daily living?: No   Development worker, international aid / [place] Devices/Equipment: None Home Equipment: Cane - single point, Conservation officer, nature (2 wheels)   Prior Device Use: Indicate devices/aids used by the patient prior to current illness, exacerbation or injury? Walker   Current Functional Level Cognition   Arousal/Alertness: Awake/alert Overall Cognitive Status: Impaired/Different from baseline Current Attention Level: Sustained Orientation Level: Oriented to person, Oriented to place Following Commands: Follows one step commands inconsistently, Follows one step  commands with increased time Safety/Judgement: Decreased awareness of safety, Decreased awareness of deficits General Comments: Patient  continues to demonstrate severe right neglect and requires verbal and tactile cues to tend to R UE and LE, unable to track past midline to the R Attention: Focused Focused Attention: Appears intact    Extremity Assessment (includes Sensation/Coordination)   Upper Extremity Assessment: RUE deficits/detail RUE Deficits / Details: difficulty with RUE movements on commands, attempts to use with functional tasks RUE Sensation: decreased light touch LUE Deficits / Details: Pt not moving LUE on command, however seen completing elbow flexion, small shoulder flexion, and abduction movements. LUE Sensation: decreased proprioception, decreased light touch LUE Coordination: decreased fine motor, decreased gross motor  Lower Extremity Assessment: Defer to PT evaluation     ADLs   Overall ADL's : Needs assistance/impaired Grooming: Wash/dry hands, Minimal assistance, Moderate assistance Grooming Details (indicate cue type and reason): performed face washing on EOB with LUE and min verbal cues, mod assist and assistance with LUE to use RUE.  Attempted to use RUE while standing at sink with max assist Upper Body Bathing: Moderate assistance, Sitting Upper Body Bathing Details (indicate cue type and reason): assistance for back and to LUE Lower Body Bathing: Maximal assistance, Sit to/from stand Lower Body Bathing Details (indicate cue type and reason): patient able to assist with bathing legs seated and max assist when standing in Stedy Upper Body Dressing : Maximal assistance Upper Body Dressing Details (indicate cue type and reason): donned gown seated on EOB General ADL Comments: grooming performed with attempts to increase RUE use and right visual field scanning     Mobility   Overal bed mobility: Needs Assistance Bed Mobility: Supine to Sit Supine to sit: HOB  elevated, Max assist, +2 for physical assistance Sit to supine: Mod assist General bed mobility comments: requires assistance with BLEs and trunk elevation with verbal cues to use LUE to assist  Transfers   Overall transfer level: Needs assistance Equipment used: 2 person hand held assist (with use of bed pad and gait belt with face to face transfer) Transfers: Sit to/from Stand, Bed to chair/wheelchair/BSC Sit to Stand: Mod assist, +2 physical assistance Bed to/from chair/wheelchair/BSC transfer type:: Step pivot Step pivot transfers: Mod assist, Max assist, +2 physical assistance Transfer via Lift Equipment: Stedy General transfer comment: Assist of 2 to power up and weight shift to perform step pivot transfer into recliner     Ambulation / Gait / Stairs / Wheelchair Mobility   Ambulation/Gait General Gait Details: pt unable at this time     Posture / Balance Dynamic Sitting Balance Sitting balance - Comments: min guard for sitting balance onEOB while performing grooming tasks and reaching task to increase attention to right Balance Overall balance assessment: Needs assistance Sitting-balance support: Feet supported, Single extremity supported Sitting balance-Leahy Scale: Fair Sitting balance - Comments: min guard for sitting balance onEOB while performing grooming tasks and reaching task to increase attention to right Standing balance support: Bilateral upper extremity supported, During functional activity, Reliant on assistive device for balance Standing balance-Leahy Scale: Poor Standing balance comment: Stood at sink with max assist of 2     Special needs/care consideration IV yes IV ABX and Diabetic management Yes h/o DM    Previous Home Environment (from acute therapy documentation) Living Arrangements: Children  Lives With: Family Available Help at Discharge: Family, Available 24 hours/day Type of Home: House Home Layout: One level Home Access: Level entry Bathroom  Shower/Tub: Chiropodist: Standard Home Care Services: No Additional Comments: has been walking unassisted at times and at times with AD   Discharge Living Setting Plans for Discharge Living Setting: Patient's home Type of Home at Discharge: House Discharge Home Layout: One level Discharge Home Access: Level entry Discharge Bathroom Shower/Tub: Tub/shower unit Discharge Bathroom Toilet: Standard Discharge Bathroom Accessibility: Yes How Accessible: Accessible via walker Does the patient have any problems obtaining your medications?: No   Social/Family/Support Systems Patient Roles: Other (Comment) Contact Information: [phone] Anticipated Caregiver: Para Skeans Anticipated Caregiver's Contact Information: [phone] Ability/Limitations of Caregiver: Min A Caregiver Availability: 24/7 (24/7 (lives with daughter who works, other son and daughter to rotate days while she works)) Discharge Plan Discussed with Primary Caregiver: Yes Is Caregiver In Agreement with Plan?: Yes Does Caregiver/Family have Issues with Lodging/Transportation while Pt is in Rehab?: No   Goals Patient/Family Goal for Rehab: PT/OT/SLP Supervision Expected length of stay: 14-16 days Pt/Family Agrees to Admission and willing to participate: Yes Program Orientation Provided & Reviewed with Pt/Caregiver Including Roles  & Responsibilities: Yes   Decrease burden of Care through IP rehab admission: Specialzed equipment needs, Decrease number of caregivers, Bowel and bladder program, and Patient/family education   Possible need for SNF placement upon discharge: not anticipated    Patient Condition: I have reviewed medical records from [facility] , spoken with CM, and patient. I met with patient at the bedside for inpatient rehabilitation assessment.  Patient will benefit from ongoing PT, OT, and SLP, can actively participate in 3 hours of therapy a day 5 days of the week,  and can make measurable gains during the admission.  Patient will also benefit from the coordinated team approach during an Inpatient Acute Rehabilitation admission.  The patient will receive intensive therapy as well as Rehabilitation physician, nursing, social worker, and care management interventions.  Due to safety, skin/wound care, disease management, medication administration, pain management, and  patient education the patient requires 24 hour a day rehabilitation nursing.  The patient is currently mod A-mod+2 with mobility and basic ADLs.  Discharge setting and therapy post discharge at home with home health is anticipated.  Patient has agreed to participate in the Acute Inpatient Rehabilitation Program and will admit today.   Preadmission Screen Completed By:  Retta Diones, 05/02/2021 10:02 AM ______________________________________________________________________   Discussed status with Dr. Dagoberto Ligas on 05/02/21 at 0930 and received approval for admission today.   Admission Coordinator:  Retta Diones, RN, time 1010/Date 05/02/21    Assessment/Plan: Diagnosis: Does the need for close, 24 hr/day Medical supervision in concert with the patient's rehab needs make it unreasonable for this patient to be served in a less intensive setting? Yes Co-Morbidities requiring supervision/potential complications: CKD IV; pAfib embolic L thalamic strokes; seizure-s local- on keppra; on Eliquis; HTN; DM- A1c 4.5 Due to bladder management, bowel management, safety, skin/wound care, disease management, medication administration, pain management, and patient education, does the patient require 24 hr/day rehab nursing? Yes Does the patient require coordinated care of a physician, rehab nurse, PT, OT, and SLP to address physical and functional deficits in the context of the above medical diagnosis(es)? Yes Addressing deficits in the following areas: balance, endurance, locomotion, strength, transferring,  bowel/bladder control, bathing, dressing, feeding, grooming, toileting, and swallowing Can the patient actively participate in an intensive therapy program of at least 3 hrs of therapy 5 days a week? Yes The potential for patient to make measurable gains while on inpatient rehab is good Anticipated functional outcomes upon discharge from inpatient rehab: supervision PT, supervision OT, supervision SLP Estimated rehab length of stay to reach the above functional goals is: 14-16 days Anticipated discharge destination: Home 10. Overall Rehab/Functional Prognosis: good     MD Signature:           Revision History                               Note Details  Jan Fireman, MD File Time 05/02/2021 10:23 AM  Author Type Physician Status Signed  Last Editor Courtney Heys, [place] # [id number] Admit Date 05/02/2021

## 2021-05-02 NOTE — H&P (Signed)
Physical Medicine and Rehabilitation Admission H&P    Chief Complaint  Patient presents with   Code Stroke  : HPI: Demisha Nokes. Roper is an 81 year old right-handed female with history of nonobstructive CAD, hypertension, hypothyroidism, type II diabetes mellitus, hyperlipidemia, normocytic anemia/monoclonal gammopathy followed by Dr. Irene Limbo, CKD stage IIIb- but GFR is 20.  Per chart review independent prior to admission occasional straight point cane due to left hip pain.  1 level home with level entry.  She has a son and daughter with good support.  Presented 04/23/2021 with acute onset of right side weakness and word finding difficulties.  Cranial CT scan showed 8 mm hypodensity left thalamus compatible with infarct of indeterminate age.  No acute hemorrhage.  MRA head and neck no intracranial large vessel occlusion identified.  Multiple sites of moderate/severe stenosis within the P2 left PCA.  Severe stenosis within the mid to distal M1 left MCA.  MRI follow-up small patchy acute cortical subcortical infarct within the left parietal lobe.  Background moderate to moderately advanced chronic small vessel ischemic changes within the cerebral white matter additionally, clustered chronic parenchymal microhemorrhages present within the right parieto-occipital lobes.  Patient did not receive tPA.  Admission chemistries unremarkable except chloride 113 glucose 160 BUN 49 creatinine 3.13, hemoglobin 9.7, alcohol negative, hemoglobin A1c 4.5, TSH 5.275.  Echocardiogram ejection fraction of 60 to 65% no wall motion abnormalities grade 1 diastolic dysfunction.  Neurology follow-up due to concerns for microhemorrhages DAPT not recommended and initially placed on aspirin alone.  Patient did develop some right hand twitching concern for focal seizure loaded with Keppra with resolution of tremor.  EEG not performed due to glued wig in place.  Hospital course PAF with RVR received IV metoprolol and subsequently had  episode of bradycardia with heart rate in the 50s with cardiology services consulted.  She was cleared to begin Eliquis for both atrial fibrillation and CVA prophylaxis and initial aspirin discontinued.  Cardiac rate remains controlled maintained on amiodarone 100 mg daily.  AKI on CKD with baseline creatinine 2.8 her HCTZ was on hold.  Noted mild leukocytosis 12,500 with urinalysis negative nitrite.  Patient was placed on Keflex empirically.  Tolerating a regular consistency diet.  Therapy evaluations completed due to patient decreased functional mobility right side weakness and word finding difficulties was admitted for a comprehensive rehab program.   Pt says has no pain, but then moves hand to abdomen- when asked if her stomach hurts, she did say yes.  Hasn't had a BM lately per son.  Denies MS pain.  Voiding OK per nursing.    Review of Systems  Constitutional:  Negative for chills and fever.  HENT:  Negative for hearing loss.   Eyes:  Negative for blurred vision and double vision.  Respiratory:  Negative for cough and shortness of breath.   Cardiovascular:  Positive for leg swelling. Negative for chest pain and palpitations.  Gastrointestinal:  Positive for constipation. Negative for heartburn, nausea and vomiting.  Genitourinary:  Negative for dysuria, flank pain and hematuria.  Musculoskeletal:  Positive for joint pain and myalgias.  Skin:  Negative for rash.  Neurological:  Positive for speech change and weakness.  All other systems reviewed and are negative. Past Medical History:  Diagnosis Date   Chronic kidney disease 12/2009   Diabetes mellitus 2010   Dyslipidemia    Hypertension    Monoclonal gammopathy 03/2010   Past Surgical History:  Procedure Laterality Date   CATARACT EXTRACTION Bilateral 12/2020  CHOLECYSTECTOMY     HERNIA REPAIR     ROTATOR CUFF REPAIR  03/10/1992   THYROID SURGERY     TONSILLECTOMY     TOTAL ABDOMINAL HYSTERECTOMY     Family History   Problem Relation Age of Onset   Healthy Mother    Heart attack Brother    Cancer Maternal Aunt    Cancer Maternal Uncle    Heart Problems Maternal Grandmother    Social History:  reports that she has never smoked. She has never used smokeless tobacco. She reports that she does not drink alcohol and does not use drugs. Allergies:  Allergies  Allergen Reactions   Losartan Other (See Comments)    MD discontinued this because of decreased kidney function   Medications Prior to Admission  Medication Sig Dispense Refill   acetaminophen (TYLENOL) 500 MG tablet Take 500 mg by mouth every 6 (six) hours as needed for headache or mild pain.     atorvastatin (LIPITOR) 20 MG tablet TAKE 1 TABLET BY MOUTH ONCE DAILY (Patient taking differently: Take 20 mg by mouth daily.) 90 tablet 3   Cholecalciferol (VITAMIN D3) 50 MCG (2000 UT) capsule Take 2,000 Units by mouth daily.     hydrochlorothiazide (MICROZIDE) 12.5 MG capsule TAKE 1 CAPSULE BY MOUTH  DAILY (Patient taking differently: Take 12.5 mg by mouth daily.) 90 capsule 3   isosorbide mononitrate (IMDUR) 30 MG 24 hr tablet TAKE 1 TABLET(30 MG) BY MOUTH DAILY (Patient taking differently: Take 30 mg by mouth daily.) 90 tablet 1   levothyroxine (SYNTHROID) 25 MCG tablet TAKE 1 TABLET(25 MCG) BY MOUTH EVERY MORNING (Patient taking differently: Take 25 mcg by mouth daily before breakfast.) 90 tablet 0   nitroGLYCERIN (NITROSTAT) 0.4 MG SL tablet DISSOLVE 1 TABLET UNDER THE TONGUE AT FIRST SIGN OF ATTACKS, MAY REPEAT EVERY 5 MINUTES UP TO 3 TABLETS, IF NO RELLIEF SEEK MEDICAL HELP (Patient taking differently: Place 0.4 mg under the tongue every 5 (five) minutes x 3 doses as needed for chest pain (SEEK HELP, IF NO RELIEF).) 25 tablet 1   solifenacin (VESICARE) 5 MG tablet Take 5 mg by mouth daily.     Blood Glucose Calibration (OT ULTRA/FASTTK CNTRL SOLN) SOLN         Home: Home Living Family/patient expects to be discharged to:: Private  residence Living Arrangements: Children Available Help at Discharge: Family, Available 24 hours/day Type of Home: House Home Access: Level entry Home Layout: One level Bathroom Shower/Tub: Chiropodist: Standard Home Equipment: Cane - single point, Conservation officer, nature (2 wheels) Additional Comments: has been walking unassisted at times and at times with AD  Lives With: Family   Functional History: Prior Function Prior Level of Function : Needs assist Physical Assist : Mobility (physical) Mobility (physical): Gait Mobility Comments: using a SPC or RW depending on her L hip pain ADLs Comments: indep  Functional Status:  Mobility: Bed Mobility Overal bed mobility: Needs Assistance Bed Mobility: Supine to Sit Supine to sit: HOB elevated, Max assist, +2 for physical assistance Sit to supine: Mod assist General bed mobility comments: requires assistance with BLEs and trunk elevation with verbal cues to use LUE to assist Transfers Overall transfer level: Needs assistance Equipment used: 2 person hand held assist (with use of bed pad and gait belt with face to face transfer) Transfers: Sit to/from Stand, Bed to chair/wheelchair/BSC Sit to Stand: Mod assist, +2 physical assistance Bed to/from chair/wheelchair/BSC transfer type:: Step pivot Step pivot transfers: Mod assist, Max  assist, +2 physical assistance Transfer via Lift Equipment: Stedy General transfer comment: Assist of 2 to power up and weight shift to perform step pivot transfer into recliner Ambulation/Gait General Gait Details: pt unable at this time    ADL: ADL Overall ADL's : Needs assistance/impaired Grooming: Wash/dry hands, Minimal assistance, Moderate assistance Grooming Details (indicate cue type and reason): performed face washing on EOB with LUE and min verbal cues, mod assist and assistance with LUE to use RUE.  Attempted to use RUE while standing at sink with max assist Upper Body Bathing:  Moderate assistance, Sitting Upper Body Bathing Details (indicate cue type and reason): assistance for back and to LUE Lower Body Bathing: Maximal assistance, Sit to/from stand Lower Body Bathing Details (indicate cue type and reason): patient able to assist with bathing legs seated and max assist when standing in Stedy Upper Body Dressing : Maximal assistance Upper Body Dressing Details (indicate cue type and reason): donned gown seated on EOB General ADL Comments: grooming performed with attempts to increase RUE use and right visual field scanning  Cognition: Cognition Overall Cognitive Status: Impaired/Different from baseline Arousal/Alertness: Awake/alert Orientation Level: Oriented to person, Oriented to place Attention: Focused Focused Attention: Appears intact Cognition Arousal/Alertness: Awake/alert Behavior During Therapy: Flat affect Overall Cognitive Status: Impaired/Different from baseline Area of Impairment: Problem solving, Awareness, Following commands, Attention, Orientation, Safety/judgement Orientation Level: Situation, Time Current Attention Level: Sustained Following Commands: Follows one step commands inconsistently, Follows one step commands with increased time Safety/Judgement: Decreased awareness of safety, Decreased awareness of deficits Awareness: Intellectual Problem Solving: Slow processing, Requires verbal cues, Requires tactile cues, Difficulty sequencing, Decreased initiation General Comments: Patient  continues to demonstrate severe right neglect and requires verbal and tactile cues to tend to R UE and LE, unable to track past midline to the R  Physical Exam: Blood pressure (!) 161/56, pulse 61, temperature 97.6 F (36.4 C), temperature source Oral, resp. rate 20, height 5\' 6"  (1.676 m), weight 95.1 kg, SpO2 100 %. Physical Exam Vitals and nursing note reviewed. Exam conducted with a chaperone present.  Constitutional:      Appearance: She is obese.      Comments: Pt appears stated age; sitting up in bed; nursing at bedside as well as son; some word finding issues noted, NAD  HENT:     Head: Normocephalic and atraumatic.     Comments: No facial droop seen at rest however has R tongue deviation    Right Ear: External ear normal.     Left Ear: External ear normal.     Nose: Nose normal. No congestion.     Mouth/Throat:     Mouth: Mucous membranes are dry.     Pharynx: Oropharynx is clear. No oropharyngeal exudate.  Eyes:     General:        Right eye: No discharge.        Left eye: No discharge.     Conjunctiva/sclera: Conjunctivae normal.     Comments: L gaze preference Unable to look R past midline   Cardiovascular:     Rate and Rhythm: Normal rate and regular rhythm.     Heart sounds: Normal heart sounds. No murmur heard.   No gallop.  Pulmonary:     Effort: Pulmonary effort is normal. No respiratory distress.     Breath sounds: Normal breath sounds. No wheezing or rales.  Abdominal:     General: There is distension.     Palpations: Abdomen is soft.  Tenderness: There is no abdominal tenderness. There is no guarding.     Comments: Hypoactive BS  Musculoskeletal:     Cervical back: Neck supple. No tenderness.     Comments: RUE 4-/5 but hard to test due to R sided neglect RLE 3+/5- again neglect makes it difficult to test LUE/LLE 5-/5  L lean in bed  Skin:    General: Skin is warm and dry.     Comments: L forearm-looks OK  Neurological:     Mental Status: She is alert.     Comments: Patient is alert.  Right-sided neglect.  She is aphasic she could utter some simple words Pt has some aphasia/word finding issues- could answer at word level L gaze preference  Michela Pitcher was [id number]- when given choices was at [facility]- out of 3 hospitals- she knew was in hospital Difficulty naming 3 objects- better with cues.  Psychiatric:     Comments: Flat, but interactive    Results for orders placed or performed during the hospital  encounter of 04/23/21 (from the past 48 hour(s))  Glucose, capillary     Status: Abnormal   Collection Time: 04/30/21  9:00 AM  Result Value Ref Range   Glucose-Capillary 121 (H) 70 - 99 mg/dL    Comment: Glucose reference range applies only to samples taken after fasting for at least 8 hours.   Comment 1 Notify RN    Comment 2 Document in Chart   Glucose, capillary     Status: Abnormal   Collection Time: 04/30/21 11:13 AM  Result Value Ref Range   Glucose-Capillary 125 (H) 70 - 99 mg/dL    Comment: Glucose reference range applies only to samples taken after fasting for at least 8 hours.   Comment 1 Notify RN    Comment 2 Document in Chart   Glucose, capillary     Status: Abnormal   Collection Time: 04/30/21  3:13 PM  Result Value Ref Range   Glucose-Capillary 127 (H) 70 - 99 mg/dL    Comment: Glucose reference range applies only to samples taken after fasting for at least 8 hours.   Comment 1 Notify RN    Comment 2 Document in Chart   Glucose, capillary     Status: Abnormal   Collection Time: 04/30/21  9:13 PM  Result Value Ref Range   Glucose-Capillary 108 (H) 70 - 99 mg/dL    Comment: Glucose reference range applies only to samples taken after fasting for at least 8 hours.  TSH     Status: Abnormal   Collection Time: 05/01/21 12:52 AM  Result Value Ref Range   TSH 5.819 (H) 0.350 - 4.500 uIU/mL    Comment: Performed by a 3rd Generation assay with a functional sensitivity of <=0.01 uIU/mL. Performed at [facility], [place] [address], [place], [place] [address]   Glucose, capillary     Status: Abnormal   Collection Time: 05/01/21  6:09 AM  Result Value Ref Range   Glucose-Capillary 119 (H) 70 - 99 mg/dL    Comment: Glucose reference range applies only to samples taken after fasting for at least 8 hours.  Glucose, capillary     Status: Abnormal   Collection Time: 05/01/21  7:30 AM  Result Value Ref Range   Glucose-Capillary 115 (H) 70 - 99 mg/dL    Comment: Glucose  reference range applies only to samples taken after fasting for at least 8 hours.  Basic metabolic panel     Status: Abnormal  Collection Time: 05/01/21  8:20 AM  Result Value Ref Range   Sodium 133 (L) 135 - 145 mmol/L   Potassium 4.7 3.5 - 5.1 mmol/L   Chloride 102 98 - 111 mmol/L   CO2 20 (L) 22 - 32 mmol/L   Glucose, Bld 113 (H) 70 - 99 mg/dL    Comment: Glucose reference range applies only to samples taken after fasting for at least 8 hours.   BUN 59 (H) 8 - 23 mg/dL   Creatinine, Ser 2.83 (H) 0.44 - 1.00 mg/dL   Calcium 8.9 8.9 - 10.3 mg/dL   GFR, Estimated 16 (L) >60 mL/min    Comment: (NOTE) Calculated using the CKD-EPI Creatinine Equation (2021)    Anion gap 11 5 - 15    Comment: Performed at [facility] [address], [place], [place] [address]  CBC     Status: Abnormal   Collection Time: 05/01/21  8:20 AM  Result Value Ref Range   WBC 12.5 (H) 4.0 - 10.5 K/uL   RBC 3.02 (L) 3.87 - 5.11 MIL/uL   Hemoglobin 9.9 (L) 12.0 - 15.0 g/dL   HCT 29.7 (L) 36.0 - 46.0 %   MCV 98.3 80.0 - 100.0 fL   MCH 32.8 26.0 - 34.0 pg   MCHC 33.3 30.0 - 36.0 g/dL   RDW 12.6 11.5 - 15.5 %   Platelets 226 150 - 400 K/uL   nRBC 0.0 0.0 - 0.2 %    Comment: Performed at [facility], [place] [address] [place], [place] [address]  T3, free     Status: Abnormal   Collection Time: 05/01/21  8:20 AM  Result Value Ref Range   T3, Free 1.6 (L) 2.0 - 4.4 pg/mL    Comment: (NOTE) Performed At: [facility] [place], [place] [id number] Rush Farmer MD [id number]   T4, free     Status: None   Collection Time: 05/01/21  8:20 AM  Result Value Ref Range   Free T4 1.04 0.61 - 1.12 ng/dL    Comment: (NOTE) Biotin ingestion may interfere with free T4 tests. If the results are inconsistent with the TSH level, previous test results, or the clinical presentation, then consider biotin interference. If needed, order repeat testing after stopping biotin. Performed at  [facility], [place] [address] [place], [place] [address]   Glucose, capillary     Status: Abnormal   Collection Time: 05/01/21 11:19 AM  Result Value Ref Range   Glucose-Capillary 122 (H) 70 - 99 mg/dL    Comment: Glucose reference range applies only to samples taken after fasting for at least 8 hours.  Glucose, capillary     Status: Abnormal   Collection Time: 05/01/21  4:45 PM  Result Value Ref Range   Glucose-Capillary 205 (H) 70 - 99 mg/dL    Comment: Glucose reference range applies only to samples taken after fasting for at least 8 hours.  Urinalysis, Routine w reflex microscopic Urine, Clean Catch     Status: Abnormal   Collection Time: 05/01/21  7:45 PM  Result Value Ref Range   Color, Urine YELLOW YELLOW   APPearance CLOUDY (A) CLEAR   Specific Gravity, Urine 1.013 1.005 - 1.030   pH 6.0 5.0 - 8.0   Glucose, UA NEGATIVE NEGATIVE mg/dL   Hgb urine dipstick SMALL (A) NEGATIVE   Bilirubin Urine NEGATIVE NEGATIVE   Ketones, ur NEGATIVE NEGATIVE mg/dL   Protein, ur 100 (A) NEGATIVE mg/dL  Nitrite NEGATIVE NEGATIVE   Leukocytes,Ua LARGE (A) NEGATIVE   RBC / HPF 0-5 0 - 5 RBC/hpf   WBC, UA >50 (H) 0 - 5 WBC/hpf   Bacteria, UA MANY (A) NONE SEEN   Squamous Epithelial / LPF 0-5 0 - 5   WBC Clumps PRESENT    Mucus PRESENT     Comment: Performed at [facility], [place] [address], [place], [place] [address]  Glucose, capillary     Status: Abnormal   Collection Time: 05/01/21  9:07 PM  Result Value Ref Range   Glucose-Capillary 122 (H) 70 - 99 mg/dL    Comment: Glucose reference range applies only to samples taken after fasting for at least 8 hours.   No results found.    Blood pressure (!) 161/56, pulse 61, temperature 97.6 F (36.4 C), temperature source Oral, resp. rate 20, height 5\' 6"  (1.676 m), weight 95.1 kg, SpO2 100 %.  Medical Problem List and Plan: 1. Functional deficits secondary to acute multifocal infarct including left ischemic thalamus, left  parietal cortical and subcortical infarcts  -patient may  shower  -ELOS/Goals: 14-16 days supervision 2.  Antithrombotics: -DVT/anticoagulation:  Pharmaceutical: Other (comment) Eliquis  -antiplatelet therapy: N/A 3. Pain Management: Tylenol as needed 4. Mood: Provide emotional support  -antipsychotic agents: N/A 5. Neuropsych: This patient is not capable of making decisions on her own behalf. 6. Skin/Wound Care: Routine skin checks 7. Fluids/Electrolytes/Nutrition: Routine in and outs with follow-up chemistries 8.  New onset atrial fibrillation with RVR.  Amiodarone 100 mg daily.  Follow-up cardiology services.  Cardiac rate controlled 9.  Hypertension.  Norvasc 5 mg daily, Imdur 30 mg daily.  Monitor with increased mobility 10.  Suspicion for focal seizure.  Continue Keppra 500 mg twice daily.  EEG not completed due to glued wig could not be removed. 11.  AKI on CKD stage IIIB- GFR 20.  Creatinine baseline 2.8.  Follow-up chemistries.  HCTZ on hold. 12.  Hyperlipidemia.  Continue Lipitor 13.  Hypothyroidism.  Synthroid 14.  Normocytic anemia/monoclonal gammopathy.  Follow-up hematology services Dr.Kale 15.  Constipation.  MiraLAX twice daily.  Senokot daily. 16.  Suspect UTI.  Urinalysis negative nitrite.  Empiric Keflex. 17.Type 2 diabetes mellitus.HG  AIC 4.5   SSI discontinued 18. Constipation- will give sorbitol and see if can get her cleaned out.   I have personally performed a face to face diagnostic evaluation of this patient and formulated the key components of the plan.  Additionally, I have personally reviewed laboratory data, imaging studies, as well as relevant notes and concur with the physician assistant's documentation above.   The patient's status has not changed from the original H&P.  Any changes in documentation from the acute care chart have been noted above.    Lavon Paganini Angiulli, PA-C 05/02/2021

## 2021-05-02 NOTE — Progress Notes (Signed)
Arrived to the unit from 3W with no issues. Alert and oriented, follows commands. Oriented to unit and assigned room. Denies pain, no signs or symptoms of distress. Accompanied by son. Admission complete.   Yehuda Mao, LPN

## 2021-05-02 NOTE — Discharge Summary (Signed)
Physician Discharge Summary  Rita Yoder VUD:[id number] DOB: 06-14-1940 DOA: 04/23/2021  PCP: Glendale Chard, MD  Admit date: 04/23/2021 Discharge date: 05/02/2021  Admitted From: Home  Disposition:CIR  Recommendations for Outpatient Follow-up:  Follow up with PCP in 1-2 weeks Please obtain BMP/CBC in one week Follow Urine culture     Discharge Condition: Stable.  CODE STATUS:Full code Diet recommendation: Heart Healthy   Brief/Interim Summary: Rita Yoder is a 81 y.o. female with medical history significant of HTN, IIDM, HLD, stage IIIb, normocytic anemia, came with new onset of right arm and hand numbness tingling.  Admitted with acute stroke.     CT had hypodensity of left thalamus suspicious for acute/subacute stroke. MRI revealed left sided patchy infarcts suggestive of embolic source, small vessel ischemic changes, and possible SAH along the right parieto-occipital lobe. Due to concern for microhemorrhages, DAPT not recommended, and the patient was started on aspirin alone. Due to right hand twitching and concern for focal seizure, keppra was loaded, with resolution of tremor. EEG not performed due to glued wig in place.    She develops Paroxysmal A fib, Dr Bonner Puna discussed with neurology, patient was started on eliquis.     Acute multifocal strokes including left ischemic thalamus stroke and left parietal cortical and subcortical infarcts with concomitant right-sided weakness.  - Given detection of atrial fibrillation on 2/17 - 2/18, started DOAC and DC DAPT.   - LDL 78, slightly above goal of 70, augmented atorvastatin 20mg  > 40mg . - Echo without IAS or CES. - PT recommends CIR at discharge. Peer to peer denied, undergoing appeal process.    New diagnosis paroxysmal atrial fibrillation: Back in sinus rhythm quickly. - Started DOAC, eliquis preferred with lower risk of GI and IC hemorrhage.  - Avoiding AV nodal agents at this time with baseline sinus  bradycardia. -develops ARV 2/21. Dr Terrence Dupont consulted. Patient started on  Amiodarone.    Suspicion for focal seizure:  - Continue keppra. Pt's wig is not able to be removed for EEG.    HTN:  - Continue with Imdur.  Started Norvasc for better BP controlled.    T2DM: HbA1c 4.5%.  - SSI stopped due to hypoglycemia. Encourage frequent meals   CKD stage IIIb: CrCl ~23ml/min. - Euvolemic, creatinine level stable/improved from admission to baseline.  Holding HCTZ.  -Prior Cr 2.8 Renal function stable    Constipation;  Last BM 2/17.  Dulcolax suppository. Continue with senna, miralax.    Leukocytosis; UA positive for UTI. Tx with antibiotics.    Anemia of CKD: Stable - Outpatient follow-up with PCP and nephrology.   Mildly elevated TSH: Not diagnostic in acute setting. - Recheck after discharge.   Obesity: need life style modification   Estimated body mass index is 40.49 kg/m as calculated from the following:   Height as of 03/25/21: 5\' 1"  (1.549 m).   Weight as of 03/25/21: 97.2 kg.   UTI; UA with more than 50 WBC. Received one dose of IV ceftriaxone. Discharge on Keflex for three days. Follow Urine culture.   Obesity: Estimated body mass index is 33.84 kg/m as calculated from the following:   Height as of this encounter: 5\' 6"  (1.676 m).   Weight as of this encounter: 95.1 kg.  Discharge Diagnoses:  Principal Problem:   Stroke (cerebrum) [facility]) Active Problems:   Stroke [facility])    Discharge Instructions  Discharge Instructions     Diet - low sodium heart healthy   Complete by: As directed  Increase activity slowly   Complete by: As directed       Allergies as of 05/02/2021       Reactions   Losartan Other (See Comments)   MD discontinued this because of decreased kidney function        Medication List     STOP taking these medications    hydrochlorothiazide 12.5 MG capsule Commonly known as: MICROZIDE       TAKE these medications     acetaminophen 500 MG tablet Commonly known as: TYLENOL Take 500 mg by mouth every 6 (six) hours as needed for headache or mild pain.   amiodarone 100 MG tablet Commonly known as: PACERONE Take 1 tablet (100 mg total) by mouth daily. Start taking on: May 03, 2021   amLODipine 5 MG tablet Commonly known as: NORVASC Take 1 tablet (5 mg total) by mouth daily. Start taking on: May 03, 2021   apixaban 2.5 MG Tabs tablet Commonly known as: ELIQUIS Take 1 tablet (2.5 mg total) by mouth 2 (two) times daily.   atorvastatin 40 MG tablet Commonly known as: LIPITOR Take 1 tablet (40 mg total) by mouth daily. Start taking on: May 03, 2021 What changed:  medication strength how much to take   cephALEXin 500 MG capsule Commonly known as: KEFLEX Take 1 capsule (500 mg total) by mouth 4 (four) times daily for 3 days.   isosorbide mononitrate 30 MG 24 hr tablet Commonly known as: IMDUR TAKE 1 TABLET(30 MG) BY MOUTH DAILY What changed: See the new instructions.   levETIRAcetam 500 MG tablet Commonly known as: KEPPRA Take 1 tablet (500 mg total) by mouth 2 (two) times daily.   levothyroxine 25 MCG tablet Commonly known as: SYNTHROID TAKE 1 TABLET(25 MCG) BY MOUTH EVERY MORNING What changed: See the new instructions.   nitroGLYCERIN 0.4 MG SL tablet Commonly known as: NITROSTAT DISSOLVE 1 TABLET UNDER THE TONGUE AT FIRST SIGN OF ATTACKS, MAY REPEAT EVERY 5 MINUTES UP TO 3 TABLETS, IF NO RELLIEF SEEK MEDICAL HELP What changed:  how much to take how to take this when to take this reasons to take this additional instructions   OT ULTRA/FASTTK CNTRL SOLN Soln   polyethylene glycol 17 g packet Commonly known as: MIRALAX / GLYCOLAX Take 17 g by mouth 2 (two) times daily.   senna 8.6 MG Tabs tablet Commonly known as: SENOKOT Take 1 tablet (8.6 mg total) by mouth daily. Start taking on: May 03, 2021   solifenacin 5 MG tablet Commonly known as:  VESICARE Take 5 mg by mouth daily.   Vitamin D3 50 MCG (2000 UT) capsule Take 2,000 Units by mouth daily.        Allergies  Allergen Reactions   Losartan Other (See Comments)    MD discontinued this because of decreased kidney function    Consultations: Neurology    Procedures/Studies: MR ANGIO HEAD WO CONTRAST  Result Date: 04/23/2021 CLINICAL DATA:  Provided history: Stroke, follow-up. EXAM: MRA HEAD WITHOUT CONTRAST TECHNIQUE: Angiographic images of the Circle of Willis were acquired using MRA technique without intravenous contrast. COMPARISON:  No pertinent prior exam. FINDINGS: Anterior circulation: The intracranial internal carotid arteries are patent. The M1 middle cerebral arteries are patent. Severe stenosis within the mid-to-distal M1 left MCA. No M2 proximal branch occlusion is identified. Atherosclerotic irregularity of the M2 and more distal MCA vessels on the left. The anterior cerebral arteries are patent. No intracranial aneurysm is identified. Posterior circulation: The intracranial vertebral arteries are patent.  The basilar artery is patent. The posterior cerebral arteries are patent. Atherosclerotic irregularity of both vessels. Most notably, there are multiple sites of moderate/severe stenosis within the P2 left PCA. Posterior communicating arteries are present bilaterally. Anatomic variants: None significant. IMPRESSION: No intracranial large vessel occlusion is identified. Intracranial atherosclerotic disease with multifocal stenoses, most notably as follows. Multiple sites of moderate/severe stenosis within the P2 left PCA. Severe stenosis within the mid-to-distal M1 left MCA. Electronically Signed   By: Kellie Simmering D.O.   On: 04/23/2021 19:18   MR ANGIO NECK WO CONTRAST  Result Date: 04/23/2021 CLINICAL DATA:  Provided history: Stroke, follow-up. EXAM: MRA NECK WITHOUT CONTRAST TECHNIQUE: Angiographic images of the neck were acquired using MRA technique without  intravenous contrast. Carotid stenosis measurements (when applicable) are obtained utilizing NASCET criteria, using the distal internal carotid diameter as the denominator. COMPARISON:  Noncontrast head CT performed earlier today 04/23/2021. Concurrently performed MRA of the head. FINDINGS: Examination limited by motion degradation and non-contrast technique. Common origin of the innominate and left common carotid arteries. Motion degradation and non-contrast technique limits evaluation of the proximal right common carotid artery. Within this limitation, the common carotid and internal carotid arteries appear patent within the neck without hemodynamically significant stenosis. Mild atherosclerotic plaque within the mid left CCA. Mild irregularity of the cervical internal carotid arteries, bilaterally, which may reflect atherosclerotic disease or sequela of fibromuscular dysplasia. Motion degradation and non-contrast technique limits evaluation of the origins of the vertebral arteries. Elsewhere, the dominant left vertebral artery is patent within the neck without appreciable stenosis. The right vertebral artery is developmentally diminutive, but appears patent within the neck. IMPRESSION: Examination limited by motion degradation and non-contrast technique. No appreciable hemodynamically significant stenosis within the common carotid or internal carotid arteries within the neck. Mild atherosclerotic plaque within the mid left common carotid artery. Mild irregularity of the mid cervical internal carotid arteries, bilaterally, which may reflect atherosclerotic disease or sequela of fibromuscular dysplasia. Additionally, there is a partially retropharyngeal course of the cervical internal carotid arteries. Motion degradation and non-contrast technique limits evaluation of the vertebral artery origins. Elsewhere, the dominant left vertebral artery is patent within the neck without appreciable stenosis. The right  vertebral artery is developmentally diminutive, but appears patent within the neck. Electronically Signed   By: Kellie Simmering D.O.   On: 04/23/2021 19:27   MR BRAIN WO CONTRAST  Result Date: 04/23/2021 CLINICAL DATA:  Provided history: Stroke, follow-up. EXAM: MRI HEAD WITHOUT CONTRAST TECHNIQUE: Multiplanar, multiecho pulse sequences of the brain and surrounding structures were obtained without intravenous contrast. COMPARISON:  Noncontrast head CT performed earlier today 04/23/2021. Concurrently performed brain MRI and MRA head 04/23/2021. FINDINGS: Mild intermittent motion degradation. Brain: Mild generalized cerebral and cerebellar atrophy. Small patchy acute cortical/subcortical infarcts within the left parietal lobe. The largest infarct at this site is cortically-based, measuring 2.6 cm in greatest dimension (series 3, image 8). Background moderate to moderately advanced patchy and confluent T2 FLAIR hyperintense signal abnormality within the cerebral white matter, nonspecific but compatible chronic small vessel ischemic disease. Chronic small vessel ischemic changes are also present within the bilateral deep gray nuclei. Additionally, mild chronic small vessel ischemic changes are present within the pons. There is mild chronic hemosiderin deposition along the right parietooccipital lobes, suggesting remote subarachnoid hemorrhage at this site. Additionally, there are clustered chronic parenchymal microhemorrhages within the right parietal and occipital lobes. There are a few additional punctate chronic microhemorrhages elsewhere within the supratentorial and infratentorial brain. No evidence  of an intracranial mass. No extra-axial fluid collection. No midline shift. Vascular: Maintained flow voids within the proximal large arterial vessels. Skull and upper cervical spine: No focal suspicious marrow lesion. Mild C3-C4 grade 1 retrolisthesis. Incompletely assessed cervical spondylosis. Sinuses/Orbits:  Visualized orbits show no acute finding. Bilateral ocular lens replacements. 18 mm mucous retention cyst within the right maxillary sinus. Trace mucosal thickening within the bilateral ethmoid sinuses. IMPRESSION: Small patchy acute cortical/subcortical infarcts within the left parietal lobe. Background moderate to moderately advanced chronic small vessel ischemic changes within the cerebral white matter. To a lesser degree, chronic small vessel ischemic changes are also present within the bilateral deep gray nuclei and within the pons. Mild chronic hemosiderin deposition along the right parietooccipital lobes, suggesting remote subarachnoid hemorrhage at this site. Additionally, clustered chronic parenchymal microhemorrhages are present within the right parietooccipital lobes. Findings are nonspecific, but may reflect manifestations of cerebral amyloid angiopathy. Mild generalized parenchymal atrophy. Paranasal sinus disease, as described. Electronically Signed   By: Kellie Simmering D.O.   On: 04/23/2021 19:38   ECHOCARDIOGRAM COMPLETE  Result Date: 04/24/2021    ECHOCARDIOGRAM REPORT   Patient Name:   Rita Yoder Date of Exam: 04/24/2021 Medical Rec #:  [id number]          Height:       61.0 in Accession #:    [id number]         Weight:       214.3 lb Date of Birth:  1941-01-23          BSA:          1.945 m Patient Age:    58 years           BP:           161/110 mmHg Patient Gender: F                  HR:           118 bpm. Exam Location:  Inpatient Procedure: 2D Echo Indications:    Chest pain  History:        Patient has no prior history of Echocardiogram examinations.                 Risk Factors:Hypertension.  Sonographer:    Arlyss Gandy Referring Phys: [id number] [place] Comments: Image acquisition challenging due to patient body habitus. IMPRESSIONS  1. Left ventricular ejection fraction, by estimation, is 60 to 65%. The left ventricle has normal function. The left ventricle has no  regional wall motion abnormalities. There is mild left ventricular hypertrophy. Left ventricular diastolic parameters are consistent with Grade I diastolic dysfunction (impaired relaxation).  2. Right ventricular systolic function is normal. The right ventricular size is normal. Tricuspid regurgitation signal is inadequate for assessing PA pressure.  3. Left atrial size was mild to moderately dilated.  4. The mitral valve is normal in structure. No evidence of mitral valve regurgitation. No evidence of mitral stenosis.  5. The aortic valve is tricuspid. Aortic valve regurgitation is not visualized. Aortic valve sclerosis/calcification is present, without any evidence of aortic stenosis.  6. The inferior vena cava is normal in size with greater than 50% respiratory variability, suggesting right atrial pressure of 3 mmHg. FINDINGS  Left Ventricle: Left ventricular ejection fraction, by estimation, is 60 to 65%. The left ventricle has normal function. The left ventricle has no regional wall motion abnormalities. The left ventricular internal cavity size was normal in size.  There is  mild left ventricular hypertrophy. Left ventricular diastolic parameters are consistent with Grade I diastolic dysfunction (impaired relaxation). Right Ventricle: The right ventricular size is normal. No increase in right ventricular wall thickness. Right ventricular systolic function is normal. Tricuspid regurgitation signal is inadequate for assessing PA pressure. Left Atrium: Left atrial size was mild to moderately dilated. Right Atrium: Right atrial size was normal in size. Pericardium: There is no evidence of pericardial effusion. Mitral Valve: The mitral valve is normal in structure. Mild mitral annular calcification. No evidence of mitral valve regurgitation. No evidence of mitral valve stenosis. Tricuspid Valve: The tricuspid valve is normal in structure. Tricuspid valve regurgitation is not demonstrated. Aortic Valve: The aortic  valve is tricuspid. Aortic valve regurgitation is not visualized. Aortic valve sclerosis/calcification is present, without any evidence of aortic stenosis. Aortic valve mean gradient measures 5.0 mmHg. Aortic valve peak gradient measures 10.2 mmHg. Aortic valve area, by VTI measures 2.70 cm. Pulmonic Valve: The pulmonic valve was normal in structure. Pulmonic valve regurgitation is not visualized. Aorta: The aortic root is normal in size and structure. Venous: The inferior vena cava is normal in size with greater than 50% respiratory variability, suggesting right atrial pressure of 3 mmHg. IAS/Shunts: No atrial level shunt detected by color flow Doppler.  LEFT VENTRICLE PLAX 2D LVIDd:         4.36 cm   Diastology LVIDs:         2.80 cm   LV e' medial:    4.35 cm/s LV PW:         1.27 cm   LV E/e' medial:  17.1 LV IVS:        1.31 cm   LV e' lateral:   4.68 cm/s LVOT diam:     2.00 cm   LV E/e' lateral: 15.9 LV SV:         100 LV SV Index:   52 LVOT Area:     3.14 cm  RIGHT VENTRICLE RV Basal diam:  3.70 cm RV Mid diam:    2.95 cm RV S prime:     10.30 cm/s TAPSE (M-mode): 2.2 cm LEFT ATRIUM             Index        RIGHT ATRIUM           Index LA diam:        3.70 cm 1.90 cm/m   RA Area:     17.50 cm 9.00 cm/m LA Vol (A2C):   53.2 ml 27.35 ml/m LA Vol (A4C):   78.0 ml 40.10 ml/m LA Biplane Vol: 67.8 ml 34.86 ml/m  AORTIC VALVE AV Area (Vmax):    2.30 cm AV Area (Vmean):   2.36 cm AV Area (VTI):     2.70 cm AV Vmax:           160.00 cm/s AV Vmean:          107.000 cm/s AV VTI:            0.371 m AV Peak Grad:      10.2 mmHg AV Mean Grad:      5.0 mmHg LVOT Vmax:         117.00 cm/s LVOT Vmean:        80.400 cm/s LVOT VTI:          0.319 m LVOT/AV VTI ratio: 0.86  AORTA Ao Root diam: 2.80 cm Ao Asc diam:  3.10 cm MITRAL VALVE MV Area (  PHT): 1.94 cm    SHUNTS MV Decel Time: 391 msec    Systemic VTI:  0.32 m MV E velocity: 74.60 cm/s  Systemic Diam: 2.00 cm MV A velocity: 81.00 cm/s MV E/A ratio:  0.92  Dalton McleanMD Electronically signed by Franki Monte Signature Date/Time: 04/24/2021/4:11:07 PM    Final    CT HEAD CODE STROKE WO CONTRAST  Result Date: 04/23/2021 CLINICAL DATA:  Code stroke. Acute neuro deficit. Left-sided gaze. Difficulty speaking. EXAM: CT HEAD WITHOUT CONTRAST TECHNIQUE: Contiguous axial images were obtained from the base of the skull through the vertex without intravenous contrast. RADIATION DOSE REDUCTION: This exam was performed according to the departmental dose-optimization program which includes automated exposure control, adjustment of the mA and/or kV according to patient size and/or use of iterative reconstruction technique. COMPARISON:  None. FINDINGS: Brain: Mild atrophy. Moderate white matter hypodensity bilaterally. This is symmetric and most likely chronic microvascular ischemia. Negative for acute hemorrhage or mass. 8 mm hypodensity left thalamus compatible with infarct of indeterminate age. Vascular: Negative for hyperdense vessel Skull: Negative Sinuses/Orbits: Retention cyst right maxillary sinus. Remaining sinuses clear. Bilateral cataract extraction Other: None ASPECTS ([place] Stroke Program Early CT Score) - Ganglionic level infarction (caudate, lentiform nuclei, internal capsule, insula, M1-M3 cortex): 7 - Supraganglionic infarction (M4-M6 cortex): 3 Total score (0-10 with 10 being normal): 10 IMPRESSION: 1. 8 mm hypodensity left thalamus compatible with infarct of indeterminate age. Possibly acute. No acute hemorrhage. 2. ASPECTS is 10 3. Atrophy and moderate chronic microvascular ischemic change. 4. Code stroke imaging results were communicated on 04/23/2021 at 4:39 pm to provider Palikh via text page Electronically Signed   By: Franchot Gallo M.D.   On: 04/23/2021 16:41     Subjective: Alert follows command  Discharge Exam: Vitals:   05/02/21 0600 05/02/21 0722  BP:  (!) 154/80  Pulse:  (!) 54  Resp: 20 14  Temp:  98.4 F (36.9 C)  SpO2:  100%      General: Pt is alert, awake, not in acute distress Cardiovascular: RRR, S1/S2 +, no rubs, no gallops Respiratory: CTA bilaterally, no wheezing, no rhonchi Abdominal: Soft, NT, ND, bowel sounds + Extremities: no edema, no cyanosis    The results of significant diagnostics from this hospitalization (including imaging, microbiology, ancillary and laboratory) are listed below for reference.     Microbiology: Recent Results (from the past 240 hour(s))  Resp Panel by RT-PCR (Flu A&B, Covid) Nasopharyngeal Swab     Status: None   Collection Time: 04/23/21  4:20 PM   Specimen: Nasopharyngeal Swab; Nasopharyngeal(NP) swabs in vial transport medium  Result Value Ref Range Status   SARS Coronavirus 2 by RT PCR NEGATIVE NEGATIVE Final    Comment: (NOTE) SARS-CoV-2 target nucleic acids are NOT DETECTED.  The SARS-CoV-2 RNA is generally detectable in upper respiratory specimens during the acute phase of infection. The lowest concentration of SARS-CoV-2 viral copies this assay can detect is 138 copies/mL. A negative result does not preclude SARS-Cov-2 infection and should not be used as the sole basis for treatment or other patient management decisions. A negative result may occur with  improper specimen collection/handling, submission of specimen other than nasopharyngeal swab, presence of viral mutation(s) within the areas targeted by this assay, and inadequate number of viral copies(<138 copies/mL). A negative result must be combined with clinical observations, patient history, and epidemiological information. The expected result is Negative.  Fact Sheet for Patients:  EntrepreneurPulse.com.au  Fact Sheet for Healthcare Providers:  IncredibleEmployment.be  This test is no t yet approved or cleared by the [place] and  has been authorized for detection and/or diagnosis of SARS-CoV-2 by FDA under an Emergency Use Authorization (EUA).  This EUA will remain  in effect (meaning this test can be used) for the duration of the COVID-19 declaration under Section 564(b)(1) of the Act, 21 U.S.C.section 360bbb-3(b)(1), unless the authorization is terminated  or revoked sooner.       Influenza A by PCR NEGATIVE NEGATIVE Final   Influenza B by PCR NEGATIVE NEGATIVE Final    Comment: (NOTE) The Xpert Xpress SARS-CoV-2/FLU/RSV plus assay is intended as an aid in the diagnosis of influenza from Nasopharyngeal swab specimens and should not be used as a sole basis for treatment. Nasal washings and aspirates are unacceptable for Xpert Xpress SARS-CoV-2/FLU/RSV testing.  Fact Sheet for Patients: EntrepreneurPulse.com.au  Fact Sheet for Healthcare Providers: IncredibleEmployment.be  This test is not yet approved or cleared by the [place] FDA and has been authorized for detection and/or diagnosis of SARS-CoV-2 by FDA under an Emergency Use Authorization (EUA). This EUA will remain in effect (meaning this test can be used) for the duration of the COVID-19 declaration under Section 564(b)(1) of the Act, 21 U.S.C. section 360bbb-3(b)(1), unless the authorization is terminated or revoked.  Performed at [facility], [place] [address] [place], [place] [address]      Labs: BNP (last 3 results) No results for input(s): BNP in the last 8760 hours. Basic Metabolic Panel: Recent Labs  Lab 04/27/21 0430 05/01/21 0820  NA 139 133*  K 4.5 4.7  CL 109 102  CO2 21* 20*  GLUCOSE 87 113*  BUN 46* 59*  CREATININE 2.80* 2.83*  CALCIUM 9.0 8.9   Liver Function Tests: No results for input(s): AST, ALT, ALKPHOS, BILITOT, PROT, ALBUMIN in the last 168 hours. No results for input(s): LIPASE, AMYLASE in the last 168 hours. No results for input(s): AMMONIA in the last 168 hours. CBC: Recent Labs  Lab 05/01/21 0820  WBC 12.5*  HGB 9.9*  HCT 29.7*  MCV 98.3  PLT 226   Cardiac  Enzymes: No results for input(s): CKTOTAL, CKMB, CKMBINDEX, TROPONINI in the last 168 hours. BNP: Invalid input(s): POCBNP CBG: Recent Labs  Lab 05/01/21 0730 05/01/21 1119 05/01/21 1645 05/01/21 2107 05/02/21 0620  GLUCAP 115* 122* 205* 122* 118*   D-Dimer No results for input(s): DDIMER in the last 72 hours. Hgb A1c No results for input(s): HGBA1C in the last 72 hours. Lipid Profile No results for input(s): CHOL, HDL, LDLCALC, TRIG, CHOLHDL, LDLDIRECT in the last 72 hours. Thyroid function studies Recent Labs    05/01/21 0052 05/01/21 0820  TSH 5.819*  --   T3FREE  --  1.6*   Anemia work up No results for input(s): VITAMINB12, FOLATE, FERRITIN, TIBC, IRON, RETICCTPCT in the last 72 hours. Urinalysis    Component Value Date/Time   COLORURINE YELLOW 05/01/2021 1945   APPEARANCEUR CLOUDY (A) 05/01/2021 1945   LABSPEC 1.013 05/01/2021 1945   PHURINE 6.0 05/01/2021 1945   GLUCOSEU NEGATIVE 05/01/2021 1945   HGBUR SMALL (A) 05/01/2021 1945   BILIRUBINUR NEGATIVE 05/01/2021 1945   BILIRUBINUR Negative 02/22/2020 [place] 05/01/2021 1945   PROTEINUR 100 (A) 05/01/2021 1945   UROBILINOGEN 0.2 02/22/2020 1716   NITRITE NEGATIVE 05/01/2021 1945   LEUKOCYTESUR LARGE (A) 05/01/2021 1945   Sepsis Labs Invalid input(s): PROCALCITONIN,  WBC,  LACTICIDVEN Microbiology Recent Results (from the past 240 hour(s))  Resp Panel by RT-PCR (Flu A&B, Covid) Nasopharyngeal Swab     Status: None   Collection Time: 04/23/21  4:20 PM   Specimen: Nasopharyngeal Swab; Nasopharyngeal(NP) swabs in vial transport medium  Result Value Ref Range Status   SARS Coronavirus 2 by RT PCR NEGATIVE NEGATIVE Final    Comment: (NOTE) SARS-CoV-2 target nucleic acids are NOT DETECTED.  The SARS-CoV-2 RNA is generally detectable in upper respiratory specimens during the acute phase of infection. The lowest concentration of SARS-CoV-2 viral copies this assay can detect is 138  copies/mL. A negative result does not preclude SARS-Cov-2 infection and should not be used as the sole basis for treatment or other patient management decisions. A negative result may occur with  improper specimen collection/handling, submission of specimen other than nasopharyngeal swab, presence of viral mutation(s) within the areas targeted by this assay, and inadequate number of viral copies(<138 copies/mL). A negative result must be combined with clinical observations, patient history, and epidemiological information. The expected result is Negative.  Fact Sheet for Patients:  EntrepreneurPulse.com.au  Fact Sheet for Healthcare Providers:  IncredibleEmployment.be  This test is no t yet approved or cleared by the [place] FDA and  has been authorized for detection and/or diagnosis of SARS-CoV-2 by FDA under an Emergency Use Authorization (EUA). This EUA will remain  in effect (meaning this test can be used) for the duration of the COVID-19 declaration under Section 564(b)(1) of the Act, 21 U.S.C.section 360bbb-3(b)(1), unless the authorization is terminated  or revoked sooner.       Influenza A by PCR NEGATIVE NEGATIVE Final   Influenza B by PCR NEGATIVE NEGATIVE Final    Comment: (NOTE) The Xpert Xpress SARS-CoV-2/FLU/RSV plus assay is intended as an aid in the diagnosis of influenza from Nasopharyngeal swab specimens and should not be used as a sole basis for treatment. Nasal washings and aspirates are unacceptable for Xpert Xpress SARS-CoV-2/FLU/RSV testing.  Fact Sheet for Patients: EntrepreneurPulse.com.au  Fact Sheet for Healthcare Providers: IncredibleEmployment.be  This test is not yet approved or cleared by the [place] FDA and has been authorized for detection and/or diagnosis of SARS-CoV-2 by FDA under an Emergency Use Authorization (EUA). This EUA will remain in effect (meaning  this test can be used) for the duration of the COVID-19 declaration under Section 564(b)(1) of the Act, 21 U.S.C. section 360bbb-3(b)(1), unless the authorization is terminated or revoked.  Performed at [facility], [place] [address] [place], [place] [address]      Time coordinating discharge: 40 minutes  SIGNED:   [name], MD  [facility]

## 2021-05-03 ENCOUNTER — Other Ambulatory Visit (HOSPITAL_COMMUNITY): Payer: Self-pay

## 2021-05-03 LAB — CBC WITH DIFFERENTIAL/PLATELET
Abs Immature Granulocytes: 0.07 10*3/uL (ref 0.00–0.07)
Basophils Absolute: 0.1 10*3/uL (ref 0.0–0.1)
Basophils Relative: 0 %
Eosinophils Absolute: 0.2 10*3/uL (ref 0.0–0.5)
Eosinophils Relative: 2 %
HCT: 29.8 % — ABNORMAL LOW (ref 36.0–46.0)
Hemoglobin: 9.8 g/dL — ABNORMAL LOW (ref 12.0–15.0)
Immature Granulocytes: 1 %
Lymphocytes Relative: 19 %
Lymphs Abs: 2.7 10*3/uL (ref 0.7–4.0)
MCH: 32.1 pg (ref 26.0–34.0)
MCHC: 32.9 g/dL (ref 30.0–36.0)
MCV: 97.7 fL (ref 80.0–100.0)
Monocytes Absolute: 1.6 10*3/uL — ABNORMAL HIGH (ref 0.1–1.0)
Monocytes Relative: 12 %
Neutro Abs: 9.6 10*3/uL — ABNORMAL HIGH (ref 1.7–7.7)
Neutrophils Relative %: 66 %
Platelets: 294 10*3/uL (ref 150–400)
RBC: 3.05 MIL/uL — ABNORMAL LOW (ref 3.87–5.11)
RDW: 12.2 % (ref 11.5–15.5)
WBC: 14.3 10*3/uL — ABNORMAL HIGH (ref 4.0–10.5)
nRBC: 0 % (ref 0.0–0.2)

## 2021-05-03 LAB — COMPREHENSIVE METABOLIC PANEL
ALT: 56 U/L — ABNORMAL HIGH (ref 0–44)
AST: 57 U/L — ABNORMAL HIGH (ref 15–41)
Albumin: 2.7 g/dL — ABNORMAL LOW (ref 3.5–5.0)
Alkaline Phosphatase: 73 U/L (ref 38–126)
Anion gap: 11 (ref 5–15)
BUN: 72 mg/dL — ABNORMAL HIGH (ref 8–23)
CO2: 19 mmol/L — ABNORMAL LOW (ref 22–32)
Calcium: 9 mg/dL (ref 8.9–10.3)
Chloride: 105 mmol/L (ref 98–111)
Creatinine, Ser: 3.06 mg/dL — ABNORMAL HIGH (ref 0.44–1.00)
GFR, Estimated: 15 mL/min — ABNORMAL LOW (ref >60)
Glucose, Bld: 102 mg/dL — ABNORMAL HIGH (ref 70–99)
Potassium: 5.1 mmol/L (ref 3.5–5.1)
Sodium: 135 mmol/L (ref 135–145)
Total Bilirubin: 0.4 mg/dL (ref 0.3–1.2)
Total Protein: 6.1 g/dL — ABNORMAL LOW (ref 6.5–8.1)

## 2021-05-03 NOTE — Plan of Care (Signed)
°  Problem: RH Bathing Goal: LTG Patient will bathe all body parts with assist levels (OT) Description: LTG: Patient will bathe all body parts with assist levels (OT) Flowsheets (Taken 05/03/2021 1609) LTG: Pt will perform bathing with assistance level/cueing: Minimal Assistance - Patient > 75%   Problem: RH Dressing Goal: LTG Patient will perform upper body dressing (OT) Description: LTG Patient will perform upper body dressing with assist, with/without cues (OT). Flowsheets (Taken 05/03/2021 1609) LTG: Pt will perform upper body dressing with assistance level of: Minimal Assistance - Patient > 75% Goal: LTG Patient will perform lower body dressing w/assist (OT) Description: LTG: Patient will perform lower body dressing with assist, with/without cues in positioning using equipment (OT) Flowsheets (Taken 05/03/2021 1609) LTG: Pt will perform lower body dressing with assistance level of: Minimal Assistance - Patient > 75%   Problem: RH Toileting Goal: LTG Patient will perform toileting task (3/3 steps) with assistance level (OT) Description: LTG: Patient will perform toileting task (3/3 steps) with assistance level (OT)  Flowsheets (Taken 05/03/2021 1609) LTG: Pt will perform toileting task (3/3 steps) with assistance level: Minimal Assistance - Patient > 75%   Problem: RH Toilet Transfers Goal: LTG Patient will perform toilet transfers w/assist (OT) Description: LTG: Patient will perform toilet transfers with assist, with/without cues using equipment (OT) Flowsheets (Taken 05/03/2021 1609) LTG: Pt will perform toilet transfers with assistance level of: Minimal Assistance - Patient > 75%   Problem: RH Tub/Shower Transfers Goal: LTG Patient will perform tub/shower transfers w/assist (OT) Description: LTG: Patient will perform tub/shower transfers with assist, with/without cues using equipment (OT) Flowsheets (Taken 05/03/2021 1609) LTG: Pt will perform tub/shower stall transfers with  assistance level of: Minimal Assistance - Patient > 75%

## 2021-05-03 NOTE — Progress Notes (Signed)
Inpatient Rehabilitation  Patient information reviewed and entered into eRehab system by Latrisha Coiro Nachum Derossett, OTR/L.   Information including medical coding, functional ability and quality indicators will be reviewed and updated through discharge.    

## 2021-05-03 NOTE — Evaluation (Signed)
Speech Language Pathology Assessment and Plan  Patient Details  Name: Rita Yoder MRN: [id number] Date of Birth: [birth year]  SLP Diagnosis: Aphasia;Cognitive Impairments  Rehab Potential: Excellent ELOS: 3.5-4 weeks    Today's Date: 05/03/2021 SLP Individual Time: 9201-0071 SLP Individual Time Calculation (min): 75 min   Hospital Problem: Principal Problem:   Subcortical infarction [facility])  Past Medical History:  Past Medical History:  Diagnosis Date   Chronic kidney disease 12/2009   Diabetes mellitus 2010   Dyslipidemia    Hypertension    Monoclonal gammopathy 03/2010   Past Surgical History:  Past Surgical History:  Procedure Laterality Date   CATARACT EXTRACTION Bilateral 12/2020   CHOLECYSTECTOMY     HERNIA REPAIR     ROTATOR CUFF REPAIR  03/10/1992   THYROID SURGERY     TONSILLECTOMY     TOTAL ABDOMINAL HYSTERECTOMY      Assessment / Plan / Recommendation Clinical Impression Patient is an 81 year old right-handed female with history of nonobstructive CAD, hypertension, hypothyroidism, type II diabetes mellitus, hyperlipidemia, normocytic anemia/monoclonal gammopathy followed by Dr. Irene Limbo, CKD stage III.   Presented 04/23/2021 with acute onset of right side weakness and word finding difficulties.  Cranial CT scan showed 8 mm hypodensity left thalamus compatible with infarct of indeterminate age.  No acute hemorrhage.  MRA head and neck no intracranial large vessel occlusion identified.  Multiple sites of moderate/severe stenosis within the P2 left PCA.  Severe stenosis within the mid to distal M1 left MCA.  MRI follow-up small patchy acute cortical subcortical infarct within the left parietal lobe.  Background moderate to moderately advanced chronic small vessel ischemic changes within the cerebral white matter additionally, clustered chronic parenchymal microhemorrhages present within the right parieto-occipital lobes.  Tolerating a regular consistency diet.  Therapy  evaluations completed with recommendations for a comprehensive inpatient rehab program. Patient admitted 05/02/21.  Patient demonstrates moderate-severe aphasia impacting all four modalities of language.  Patient with decreased yes/no accuracy and decreased ability to follow basic commands which is also impacted by motor planning deficits. Patient able to verbalize biographical information at the word and phrase level with extra time and verbal, visual and phonemic cues. Patient with severe word-finding deficits at the sentence level resulting intermittent frustration but increased social responses noted as well as appropriate spontaneous verbalizations regarding wants/needs. Moderate-severe deficits were also noted in selective attention, functional problem solving, right visual scanning, recall of information, and awareness which impacts her safety with functional and familiar tasks. Patient would benefit from skilled SLP intervention to maximize her cognitive-linguistic functioning and overall functional independence prior to discharge.    Skilled Therapeutic Interventions          Administered a cognitive-linguistic evaluation, please see above for details. Upon arrival, patient was awake and alert with her granddaughter present. SLP provided the patient her breakfast tray. Patient with a severe right inattention with Max cues needed to locate items on right side of tray. Max verbal cues were also needed for patient to name food items or make choices from a field of 2 regarding food preferences. Patient with minimal PO intake and required assist with self-feeding due to motor planning deficits. Patient demonstrated mild deficits in selective attention when family having conversations within the room during evaluation. Patient with appropriate social responses and could spontaneously verbalize at the phrase level regarding preferences in an informal conversation. Severe word-finding deficits noted during  structured language tasks with patient demonstrating intermittent frustration. Moderate deficits in recall of functional information was also  needed regarding biographical information. All questions were answered and patient left upright in bed with alarm on and all needs within reach.    SLP Assessment  Patient will need skilled [facility] during CIR admission    Recommendations  Oral Care Recommendations: Oral care BID Patient destination: Home Follow up Recommendations: Home Health SLP;24 hour supervision/assistance Equipment Recommended: None recommended by SLP    SLP Frequency 3 to 5 out of 7 days   SLP Duration  SLP Intensity  SLP Treatment/Interventions 3.5-4 weeks  Minumum of 1-2 x/day, 30 to 90 minutes  Cognitive remediation/compensation;Internal/external aids;Speech/Language facilitation;Therapeutic Activities;Environmental controls;Cueing hierarchy;Functional tasks;Patient/family education    Pain Pain Assessment Pain Scale: 0-10 Pain Score: 0-No pain  Prior Functioning Type of Home: House  Lives With: Son (per son present, pt lives w/ his brother.) Available Help at Discharge: Family;Available 24 hours/day  SLP Evaluation Cognition Overall Cognitive Status: Impaired/Different from baseline Arousal/Alertness: Awake/alert Orientation Level: Oriented to person;Oriented to place Year: Other (Comment) (pt responding "I don't know" and would not guess a year) Month:  (pt reporting "I don't know and would not guess" said the season was "summer") Day of Week: Incorrect Attention: Sustained;Selective Focused Attention: Appears intact Sustained Attention: Appears intact Selective Attention: Impaired Selective Attention Impairment: Verbal basic;Functional basic Memory: Impaired Immediate Memory Recall: Blue;Bed Memory Recall Sock: Not able to recall Memory Recall Blue: Not able to recall Memory Recall Bed: Not able to recall Awareness:  Impaired Awareness Impairment: Intellectual impairment Problem Solving: Impaired Problem Solving Impairment: Functional basic Safety/Judgment: Impaired  Comprehension Auditory Comprehension Overall Auditory Comprehension: Impaired Yes/No Questions: Impaired Basic Immediate Environment Questions: 25-49% accurate Complex Questions: 25-49% accurate Commands: Impaired One Step Basic Commands: 50-74% accurate Conversation: Simple Interfering Components: Visual impairments;Motor planning;Working memory;Processing speed;Attention Visual Recognition/Discrimination Discrimination: Not tested Reading Comprehension Reading Status: Impaired Expression Expression Primary Mode of Expression: Verbal Verbal Expression Overall Verbal Expression: Impaired Initiation: Impaired Automatic Speech: Social Response Level of Generative/Spontaneous Verbalization: Word;Phrase Repetition: Impaired Naming: Impairment Confrontation: Impaired Written Expression Dominant Hand: Right Written Expression: Not tested Oral Motor Oral Motor/Sensory Function Overall Oral Motor/Sensory Function: Within functional limits Motor Speech Overall Motor Speech: Appears within functional limits for tasks assessed Respiration: Within functional limits Resonance: Within functional limits Articulation: Within functional limitis Motor Planning: Witnin functional limits  Care Tool Care Tool Cognition Ability to hear (with hearing aid or hearing appliances if normally used Ability to hear (with hearing aid or hearing appliances if normally used): 0. Adequate - no difficulty in normal conservation, social interaction, listening to TV   Expression of Ideas and Wants Expression of Ideas and Wants: 2. Frequent difficulty - frequently exhibits difficulty with expressing needs and ideas   Understanding Verbal and Non-Verbal Content Understanding Verbal and Non-Verbal Content: 2. Sometimes understands - understands only basic  conversations or simple, direct phrases. Frequently requires cues to understand  Memory/Recall Ability Memory/Recall Ability : None of the above were recalled    Short Term Goals: Week 1: SLP Short Term Goal 1 (Week 1): Patient will scan to left visual field to locate functional items in 25% of opportunities with Max A multimodal cues. SLP Short Term Goal 2 (Week 1): Patient will demonstrate orientation to month, place and situation with Max A multimodal cues. SLP Short Term Goal 3 (Week 1): Patient will initiate functional tasks in 50% of opportunities with Mod multimodal cues. SLP Short Term Goal 4 (Week 1): Patient will answer yes/no questions with 50% accuracy with Mod A multimodal cues. SLP Short  Term Goal 5 (Week 1): Patient will follow basic commands in 50% of opportunities with Mod A multimodal cues. SLP Short Term Goal 6 (Week 1): Patient will name functional items/family members with 50% accuracy and Max A multimodal cues.  Refer to Care Plan for Long Term Goals  Recommendations for other services: None   Discharge Criteria: Patient will be discharged from SLP if patient refuses treatment 3 consecutive times without medical reason, if treatment goals not met, if there is a change in medical status, if patient makes no progress towards goals or if patient is discharged from hospital.  The above assessment, treatment plan, treatment alternatives and goals were discussed and mutually agreed upon: by patient and by family  Tonnya Garbett 05/03/2021, 3:30 PM

## 2021-05-03 NOTE — Evaluation (Signed)
Physical Therapy Assessment and Plan  Patient Details  Name: Rita Yoder MRN: [id number] Date of Birth: Feb 25, 1941  PT Diagnosis: Abnormal posture, Abnormality of gait, Difficulty walking, and Hemiparesis dominant Rehab Potential: Good ELOS: 3.5-4 weeks.   Today's Date: 05/03/2021 PT Individual Time: 1300-1400 PT Individual Time Calculation (min): 60 min    Hospital Problem: Principal Problem:   Subcortical infarction [facility])   Past Medical History:  Past Medical History:  Diagnosis Date   Chronic kidney disease 12/2009   Diabetes mellitus 2010   Dyslipidemia    Hypertension    Monoclonal gammopathy 03/2010   Past Surgical History:  Past Surgical History:  Procedure Laterality Date   CATARACT EXTRACTION Bilateral 12/2020   CHOLECYSTECTOMY     HERNIA REPAIR     ROTATOR CUFF REPAIR  03/10/1992   THYROID SURGERY     TONSILLECTOMY     TOTAL ABDOMINAL HYSTERECTOMY      Assessment & Plan Clinical Impression: Rita Yoder is an 81 year old right-handed female with history of nonobstructive CAD, hypertension, hypothyroidism, type II diabetes mellitus, hyperlipidemia, normocytic anemia/monoclonal gammopathy followed by Dr. Irene Limbo, CKD stage IIIb- but GFR is 20.  Per chart review independent prior to admission occasional straight point cane due to left hip pain.  1 level home with level entry.  She has a son and daughter with good support.  Presented 04/23/2021 with acute onset of right side weakness and word finding difficulties.  Cranial CT scan showed 8 mm hypodensity left thalamus compatible with infarct of indeterminate age.  No acute hemorrhage.  MRA head and neck no intracranial large vessel occlusion identified.  Multiple sites of moderate/severe stenosis within the P2 left PCA.  Severe stenosis within the mid to distal M1 left MCA.  MRI follow-up small patchy acute cortical subcortical infarct within the left parietal lobe.  Background moderate to moderately advanced  chronic small vessel ischemic changes within the cerebral white matter additionally, clustered chronic parenchymal microhemorrhages present within the right parieto-occipital lobes.  Patient did not receive tPA.  Admission chemistries unremarkable except chloride 113 glucose 160 BUN 49 creatinine 3.13, hemoglobin 9.7, alcohol negative, hemoglobin A1c 4.5, TSH 5.275.  Echocardiogram ejection fraction of 60 to 65% no wall motion abnormalities grade 1 diastolic dysfunction.  Neurology follow-up due to concerns for microhemorrhages DAPT not recommended and initially placed on aspirin alone.  Patient did develop some right hand twitching concern for focal seizure loaded with Keppra with resolution of tremor.  EEG not performed due to glued wig in place.  Hospital course PAF with RVR received IV metoprolol and subsequently had episode of bradycardia with heart rate in the 50s with cardiology services consulted.  She was cleared to begin Eliquis for both atrial fibrillation and CVA prophylaxis and initial aspirin discontinued.  Cardiac rate remains controlled maintained on amiodarone 100 mg daily.  AKI on CKD with baseline creatinine 2.8 her HCTZ was on hold.  Noted mild leukocytosis 12,500 with urinalysis negative nitrite.  Patient was placed on Keflex empirically.  Tolerating a regular consistency diet.  Therapy evaluations completed due to patient decreased functional mobility right side weakness and word finding difficulties was admitted for a comprehensive rehab program.  Patient currently requires total with mobility secondary to decreased coordination and decreased motor planning, decreased visual acuity, and decreased standing balance, decreased postural control, and hemiplegia.  Prior to hospitalization, patient was independent  with mobility and lived with Son (per son present, pt lives w/ his brother.) in a House home.  Home  access is  Level entry (has small step to enter, approx. 3").  Patient will benefit  from skilled PT intervention to maximize safe functional mobility, minimize fall risk, and decrease caregiver burden for planned discharge home with 24 hour assist.  Anticipate patient will benefit from follow up [facility] at discharge.  PT - End of Session Activity Tolerance: Tolerates 30+ min activity with multiple rests Endurance Deficit: Yes PT Assessment Rehab Potential (ACUTE/IP ONLY): Good PT Barriers to Discharge Comments: 1 STE, increased body habitus. PT Patient demonstrates impairments in the following area(s): Balance;Safety;Endurance;Motor PT Transfers Functional Problem(s): Bed Mobility;Bed to Chair;Car;Furniture PT Locomotion Functional Problem(s): Ambulation;Stairs PT Plan PT Intensity: Minimum of 1-2 x/day ,45 to 90 minutes PT Frequency: 5 out of 7 days PT Duration Estimated Length of Stay: 3.5-4 weeks. PT Treatment/Interventions: Ambulation/gait training;Discharge planning;Functional mobility training;Therapeutic Activities;UE/LE Strength taining/ROM;Balance/vestibular training;Community reintegration;Neuromuscular re-education;Patient/family education;Stair training;Therapeutic Exercise;UE/LE Coordination activities PT Transfers Anticipated Outcome(s): min A for all surfaces. PT Locomotion Anticipated Outcome(s): min A w/ LRAD. PT Recommendation Follow Up Recommendations: Home health PT Patient destination: Home Equipment Recommended: To be determined Equipment Details: pt has RW   PT Evaluation Precautions/Restrictions Precautions Precautions: Fall Precaution Comments: Rt hemiparesis and Rt inattention Restrictions Weight Bearing Restrictions: No General Chart Reviewed: Yes Family/Caregiver Present: Yes Vital SignsTherapy Vitals Temp: 98 F (36.7 C) Pulse Rate: 69 Resp: 18 BP: (!) 148/75 Patient Position (if appropriate): Lying Oxygen Therapy SpO2: 99 % O2 Device: Room Air Pain Pain Assessment Pain Scale: 0-10 Pain Score: 0-No pain Pain Interference Pain  Interference Pain Effect on Sleep: 0. Does not apply - I have not had any pain or hurting in the past 5 days Pain Interference with Therapy Activities: 1. Rarely or not at all Pain Interference with Day-to-Day Activities: 1. Rarely or not at all Home Living/Prior Barren Available Help at Discharge: Family;Available 24 hours/day Type of Home: House Home Access: Level entry (has small step to enter, approx. 3") Home Layout: One level Bathroom Shower/Tub: Chiropodist: Standard Additional Comments: has been walking unassisted at times and at times with AD  Lives With: Son (per son present, pt lives w/ his brother.) Prior Function Level of Independence: Independent with basic ADLs;Requires assistive device for independence Driving: No Vision/Perception  Vision - History Ability to See in Adequate Light: 3 Highly impaired Vision - Assessment Ocular Range of Motion: Restricted on the right Alignment/Gaze Preference: Gaze left Tracking/Visual Pursuits: Impaired - to be further tested in functional context Perception Perception: Impaired Inattention/Neglect: Does not attend to right side of body;Does not attend to right visual field Praxis Praxis: Impaired Praxis Impairment Details: Initiation;Ideomotor;Motor planning  Cognition Overall Cognitive Status: Impaired/Different from baseline Arousal/Alertness: Awake/alert Orientation Level: Oriented to person;Oriented to place Year: Other (Comment) (pt responding "I don't know" and would not guess a year) Month:  (pt reporting "I don't know and would not guess" said the season was "summer") Day of Week: Incorrect Attention: Sustained;Selective Focused Attention: Appears intact Sustained Attention: Appears intact Selective Attention: Impaired Selective Attention Impairment: Verbal basic;Functional basic Memory: Impaired Immediate Memory Recall: Blue;Bed Memory Recall Sock: Not able to recall Memory  Recall Blue: Not able to recall Memory Recall Bed: Not able to recall Awareness: Impaired Awareness Impairment: Intellectual impairment Problem Solving: Impaired Problem Solving Impairment: Functional basic Safety/Judgment: Impaired Sensation Sensation Light Touch: Not tested (pt unable to correctly perform.) Coordination Gross Motor Movements are Fluid and Coordinated: No Fine Motor Movements are Fluid and Coordinated: No Coordination and Movement Description: Impaired due  to Rt hemiparesis Finger Nose Finger Test: Unable to complete with max cues and hand over hand assist to see carryover of understanding Heel Shin Test: unable to perform Motor  Motor Motor: Hemiplegia;Motor apraxia;Abnormal postural alignment and control   Trunk/Postural Assessment  Cervical Assessment Cervical Assessment: Exceptions to [facility] (forward head) Thoracic Assessment Thoracic Assessment: Exceptions to [facility] (kyphotic.) Lumbar Assessment Lumbar Assessment: Exceptions to [facility] (posterior pelvic tilt) Postural Control Postural Control: Deficits on evaluation  Balance Balance Balance Assessed: Yes Dynamic Sitting Balance Dynamic Sitting - Balance Support: During functional activity Dynamic Sitting - Level of Assistance: 4: Min assist Sitting balance - Comments: min guard at EOB w/ and w/o UE support.  Pt sitting w/ LE support. Extremity Assessment  RUE Assessment RUE Assessment: Exceptions to [facility] RUE Body System: Neuro Brunstrum levels for arm and hand: Arm;Hand Brunstrum level for arm: Stage II Synergy is developing Brunstrum level for hand: Stage I Flaccidity RUE Tone RUE Tone: Hypotonic LUE Assessment LUE Assessment: Within Functional Limits RLE Assessment General Strength Comments: grossly 3-5 hip, at least 3/5 knee. LLE Assessment LLE Assessment: Within Functional Limits General Strength Comments: WFL for body habitus.  Care Tool Care Tool Bed Mobility Roll left and right activity    Roll left and right assist level: Maximal Assistance - Patient 25 - 49%    Sit to lying activity   Sit to lying assist level: Maximal Assistance - Patient 25 - 49%    Lying to sitting on side of bed activity   Lying to sitting on side of bed assist level: the ability to move from lying on the back to sitting on the side of the bed with no back support.: Maximal Assistance - Patient 25 - 49%     Care Tool Transfers Sit to stand transfer   Sit to stand assist level: Total Assistance - Patient < 25%    Chair/bed transfer Chair/bed transfer activity did not occur: Safety/medical concerns       Toilet transfer Toilet transfer activity did not occur: N/A (not attempted due to time constraints)      Scientist, product/process development transfer activity did not occur: Safety/medical concerns        Care Tool Locomotion Ambulation Ambulation activity did not occur: Safety/medical concerns        Walk 10 feet activity Walk 10 feet activity did not occur: Safety/medical concerns       Walk 50 feet with 2 turns activity Walk 50 feet with 2 turns activity did not occur: Safety/medical concerns      Walk 150 feet activity Walk 150 feet activity did not occur: Safety/medical concerns      Walk 10 feet on uneven surfaces activity Walk 10 feet on uneven surfaces activity did not occur: Safety/medical concerns      Stairs Stair activity did not occur: Safety/medical concerns        Walk up/down 1 step activity Walk up/down 1 step or curb (drop down) activity did not occur: Safety/medical concerns      Walk up/down 4 steps activity Walk up/down 4 steps activity did not occur: Safety/medical concerns      Walk up/down 12 steps activity Walk up/down 12 steps activity did not occur: Safety/medical concerns      Pick up small objects from floor Pick up small object from the floor (from standing position) activity did not occur: Safety/medical concerns      Wheelchair Is the patient using a  wheelchair?: No   Wheelchair activity did  not occur: Safety/medical concerns      Wheel 50 feet with 2 turns activity Wheelchair 50 feet with 2 turns activity did not occur: Safety/medical concerns    Wheel 150 feet activity Wheelchair 150 feet activity did not occur: Safety/medical concerns      Refer to Care Plan for Long Term Goals  SHORT TERM GOAL WEEK 1 PT Short Term Goal 1 (Week 1): Pt will roll side to side w/ mod A PT Short Term Goal 2 (Week 1): Pt will transfer sit to stand w/ mod A PT Short Term Goal 3 (Week 1): Pt will transfer sit to stand w/ mod A PT Short Term Goal 4 (Week 1): Pt will tolerate standing x 3' performing UE activity.  Recommendations for other services: None   Skilled Therapeutic Intervention Evaluation completed (see details above and below) with education on PT POC and goals and individual treatment initiated with focus on  transfers, right neglect, strengthening, balance, progress to gait.  Pt presents supine in bed w/ son present and agreeable to therapy.  Son states fatigue today.  Pt non attention to right side.  Pt required max to total A for supine to sit at EOB and then able to maintain seated balance w/ and w/o UE support, although prefers L UE support.  Pt required max A of PT facing pt for sit to stand and min blocking LEs, but then states is unable to continue.  Pt unable to transfer to recliner.  Pt stood w/ max A in Stedy pulling up on crossbar and hand over hand to maintain R hand .  Pt unable to complete standing to re-position Stedy perch.  Pt returned to sitting and able to scoot self into bed w/ simple command slowly.  PT required max A to complete sit to supine transfer.  Pt rolled side to side w/ max A and verbal cues and placement of hand on siderail to re-position lift pad.  Pt remained in bed w/ bed alarm on and all needs in reach.  Son was present during session and questions answered.  Good family support.   Mobility Bed Mobility Bed  Mobility: Rolling Right;Rolling Left;Sit to Supine;Supine to Sit Rolling Right: Maximal Assistance - Patient 25-49% Rolling Left: Maximal Assistance - Patient 25-49% Supine to Sit: Maximal Assistance - Patient - Patient 25-49%;Total Assistance - Patient < 25% Sit to Supine: Maximal Assistance - Patient 25-49%;Total Assistance - Patient < 25% Transfers Transfers: Sit to Stand Sit to Stand: Maximal Assistance - Patient 25-49% Transfer (Assistive device): Other (Comment) (PT facing.) Transfer via Lift Equipment: Stedy (Pt able to assist to power up into Stedy using B hands and hand over hand for R hand.) Locomotion  Gait Ambulation: No Stairs / Additional Locomotion Stairs: No Wheelchair Mobility Wheelchair Mobility: No   Discharge Criteria: Patient will be discharged from PT if patient refuses treatment 3 consecutive times without medical reason, if treatment goals not met, if there is a change in medical status, if patient makes no progress towards goals or if patient is discharged from hospital.  The above assessment, treatment plan, treatment alternatives and goals were discussed and mutually agreed upon: by patient and by family  Ladoris Gene 05/03/2021, 3:38 PM

## 2021-05-03 NOTE — Progress Notes (Signed)
PROGRESS NOTE   Subjective/Complaints: Discussed transaminitis- discontinuing tylenol. Her neice says she was using it for low back pain. Discussed trying kpad and she is appreciative.  Niece notes she was independent prior to admission  ROS: +back pain  Objective:   No results found. Recent Labs    05/01/21 0820 05/03/21 0615  WBC 12.5* 14.3*  HGB 9.9* 9.8*  HCT 29.7* 29.8*  PLT 226 294   Recent Labs    05/01/21 0820 05/03/21 0615  NA 133* 135  K 4.7 5.1  CL 102 105  CO2 20* 19*  GLUCOSE 113* 102*  BUN 59* 72*  CREATININE 2.83* 3.06*  CALCIUM 8.9 9.0    Intake/Output Summary (Last 24 hours) at 05/03/2021 1333 Last data filed at 05/03/2021 0900 Gross per 24 hour  Intake 360 ml  Output --  Net 360 ml        Physical Exam: Vital Signs Blood pressure (!) 143/55, pulse 70, temperature 98.4 F (36.9 C), temperature source Oral, resp. rate 20, height 5\' 6"  (1.676 m), weight 90.6 kg, SpO2 100 %. Constitutional:      Appearance: She is obese. BMI 32.24    Comments: Pt appears stated age; sitting up in bed; nursing at bedside as well as son; some word finding issues noted, NAD  HENT:     Head: Normocephalic and atraumatic.     Comments: No facial droop seen at rest however has R tongue deviation    Right Ear: External ear normal.     Left Ear: External ear normal.     Nose: Nose normal. No congestion.     Mouth/Throat:     Mouth: Mucous membranes are dry.     Pharynx: Oropharynx is clear. No oropharyngeal exudate.  Eyes:     General:        Right eye: No discharge.        Left eye: No discharge.     Conjunctiva/sclera: Conjunctivae normal.     Comments: L gaze preference Unable to look R past midline   Cardiovascular:     Rate and Rhythm: Normal rate and regular rhythm.     Heart sounds: Normal heart sounds. No murmur heard.   No gallop.  Pulmonary:     Effort: Pulmonary effort is normal. No  respiratory distress.     Breath sounds: Normal breath sounds. No wheezing or rales.  Abdominal:     General: There is distension.     Palpations: Abdomen is soft.     Tenderness: There is no abdominal tenderness. There is no guarding.     Comments: Hypoactive BS  Musculoskeletal:     Cervical back: Neck supple. No tenderness.     Comments: RUE 4-/5 but hard to test due to R sided neglect RLE 3+/5- again neglect makes it difficult to test LUE/LLE 5-/5   L lean in bed  Skin:    General: Skin is warm and dry.     Comments: L forearm-looks OK  Neurological:     Mental Status: She is alert.     Comments: Patient is alert.  Right-sided neglect.  She is aphasic she could utter some simple words Pt  has some aphasia/word finding issues- could answer at word level L gaze preference  Michela Pitcher was 1490- when given choices was at [facility]- out of 3 hospitals- she knew was in hospital Difficulty naming 3 objects- better with cues.  Psychiatric:     Comments: Flat, but interactive    Assessment/Plan: 1. Functional deficits which require 3+ hours per day of interdisciplinary therapy in a comprehensive inpatient rehab setting. Physiatrist is providing close team supervision and 24 hour management of active medical problems listed below. Physiatrist and rehab team continue to assess barriers to discharge/monitor patient progress toward functional and medical goals  Care Tool:  Bathing        Body parts bathed by helper: Right arm, Left arm, Chest, Abdomen, Front perineal area, Buttocks, Right upper leg, Left upper leg, Left lower leg, Right lower leg, Face     Bathing assist Assist Level: 2 Helpers     Upper Body Dressing/Undressing Upper body dressing   What is the patient wearing?: Dress    Upper body assist Assist Level: 2 Helpers    Lower Body Dressing/Undressing Lower body dressing      What is the patient wearing?: Incontinence brief     Lower body assist Assist for  lower body dressing: 2 Helpers     Toileting Toileting    Toileting assist Assist for toileting: 2 Helpers     Transfers Chair/bed transfer  Transfers assist           Locomotion Ambulation   Ambulation assist              Walk 10 feet activity   Assist           Walk 50 feet activity   Assist           Walk 150 feet activity   Assist           Walk 10 feet on uneven surface  activity   Assist           Wheelchair     Assist               Wheelchair 50 feet with 2 turns activity    Assist            Wheelchair 150 feet activity     Assist          Blood pressure (!) 143/55, pulse 70, temperature 98.4 F (36.9 C), temperature source Oral, resp. rate 20, height 5\' 6"  (1.676 m), weight 90.6 kg, SpO2 100 %.  Medical Problem List and Plan: 1. Functional deficits secondary to acute multifocal infarct including left ischemic thalamus, left parietal cortical and subcortical infarcts             -patient may  shower             -ELOS/Goals: 14-16 days supervision  Admit to CIR 2.  Antithrombotics: -DVT/anticoagulation:  Pharmaceutical: Other (comment) Eliquis             -antiplatelet therapy: N/A 3. Back pain: kpad ordered 4. Mood: Provide emotional support             -antipsychotic agents: N/A 5. Neuropsych: This patient is not capable of making decisions on her own behalf. 6. Skin/Wound Care: Routine skin checks 7. Fluids/Electrolytes/Nutrition: Routine in and outs with follow-up chemistries 8.  New onset atrial fibrillation with RVR.  Amiodarone 100 mg daily.  Follow-up cardiology services.  Cardiac rate controlled 9.  Hypertension.  Norvasc 5 mg  daily, Imdur 30 mg daily.  Monitor with increased mobility 10.  Suspicion for focal seizure.  Continue Keppra 500 mg twice daily.  EEG not completed due to glued wig could not be removed. 11.  AKI on CKD stage IIIB- GFR 20.  Creatinine baseline 2.8.   Follow-up chemistries.  HCTZ on hold. 12.  Hyperlipidemia.  Continue Lipitor 13.  Hypothyroidism.  Synthroid 14.  Normocytic anemia/monoclonal gammopathy.  Follow-up hematology services Dr.Kale 15.  Constipation.  MiraLAX twice daily.  Senokot daily. 16.  Suspect UTI.  Urinalysis negative nitrite.  Empiric Keflex. 17.Type 2 diabetes mellitus.HG  AIC 4.5   SSI discontinued 18. Constipation- will give sorbitol and see if can get her cleaned out.  19. Transaminitis: tylenol d/ced 20. Leukocytosis: denies dysuria, shortness of breath. Afebrile. Repeat CBC tomorrow to trend.  LOS: 1 days A FACE TO FACE EVALUATION WAS PERFORMED  Martha Clan P Cullin Dishman 05/03/2021, 1:33 PM

## 2021-05-04 LAB — CBC WITH DIFFERENTIAL/PLATELET
Abs Immature Granulocytes: 0.07 10*3/uL (ref 0.00–0.07)
Basophils Absolute: 0.1 10*3/uL (ref 0.0–0.1)
Basophils Relative: 0 %
Eosinophils Absolute: 0.2 10*3/uL (ref 0.0–0.5)
Eosinophils Relative: 2 %
HCT: 29.3 % — ABNORMAL LOW (ref 36.0–46.0)
Hemoglobin: 9.5 g/dL — ABNORMAL LOW (ref 12.0–15.0)
Immature Granulocytes: 1 %
Lymphocytes Relative: 15 %
Lymphs Abs: 1.8 10*3/uL (ref 0.7–4.0)
MCH: 32 pg (ref 26.0–34.0)
MCHC: 32.4 g/dL (ref 30.0–36.0)
MCV: 98.7 fL (ref 80.0–100.0)
Monocytes Absolute: 1.1 10*3/uL — ABNORMAL HIGH (ref 0.1–1.0)
Monocytes Relative: 9 %
Neutro Abs: 8.6 10*3/uL — ABNORMAL HIGH (ref 1.7–7.7)
Neutrophils Relative %: 73 %
Platelets: 307 10*3/uL (ref 150–400)
RBC: 2.97 MIL/uL — ABNORMAL LOW (ref 3.87–5.11)
RDW: 12 % (ref 11.5–15.5)
WBC: 11.8 10*3/uL — ABNORMAL HIGH (ref 4.0–10.5)
nRBC: 0 % (ref 0.0–0.2)

## 2021-05-04 LAB — BASIC METABOLIC PANEL
Anion gap: 11 (ref 5–15)
BUN: 75 mg/dL — ABNORMAL HIGH (ref 8–23)
CO2: 18 mmol/L — ABNORMAL LOW (ref 22–32)
Calcium: 8.9 mg/dL (ref 8.9–10.3)
Chloride: 105 mmol/L (ref 98–111)
Creatinine, Ser: 3.04 mg/dL — ABNORMAL HIGH (ref 0.44–1.00)
GFR, Estimated: 15 mL/min — ABNORMAL LOW (ref >60)
Glucose, Bld: 226 mg/dL — ABNORMAL HIGH (ref 70–99)
Potassium: 4.5 mmol/L (ref 3.5–5.1)
Sodium: 134 mmol/L — ABNORMAL LOW (ref 135–145)

## 2021-05-04 LAB — URINE CULTURE: Culture: >=100000 — AB

## 2021-05-04 NOTE — IPOC Note (Addendum)
Individualized overall Plan of Care [facility]) Patient Details Name: Rita Yoder MRN: [id number] DOB: 12-Nov-1940  Admitting Diagnosis: Subcortical infarction [facility])  Hospital Problems: Principal Problem:   Subcortical infarction [facility])     Functional Problem List: Nursing Bladder, Bowel, Endurance, Medication Management, Safety  PT Balance, Safety, Endurance, Motor  OT Balance, Cognition, Vision, Edema, Endurance, Motor, Pain, Safety, Perception  SLP Cognition, Linguistic  TR         Basic ADLs: OT Grooming, Bathing, Dressing, Toileting, Eating     Advanced  ADLs: OT Simple Meal Preparation     Transfers: PT Bed Mobility, Bed to Chair, Car, Manufacturing systems engineer, Metallurgist: PT Ambulation, Stairs     Additional Impairments: OT Fuctional Use of Upper Extremity  SLP Communication, Social Cognition comprehension, expression Social Interaction, Problem Solving, Memory, Attention, Awareness  TR      Anticipated Outcomes Item Anticipated Outcome  Self Feeding No goal  Swallowing      Basic self-care  Min A  Toileting  Min A   Bathroom Transfers Min A  Bowel/Bladder  supervision  Transfers  min A for all surfaces.  Locomotion  min A w/ LRAD.  Communication  Min A  Cognition  Min A  Pain  n/a  Safety/Judgment  supervision   Therapy Plan: PT Intensity: Minimum of 1-2 x/day ,45 to 90 minutes PT Frequency: 5 out of 7 days PT Duration Estimated Length of Stay: 3.5-4 weeks. OT Intensity: Minimum of 1-2 x/day, 45 to 90 minutes OT Frequency: 5 out of 7 days OT Duration/Estimated Length of Stay: 3.5 to 4 weeks SLP Intensity: Minumum of 1-2 x/day, 30 to 90 minutes SLP Frequency: 3 to 5 out of 7 days SLP Duration/Estimated Length of Stay: 3.5-4 weeks    Team Interventions: Nursing Interventions Patient/Family Education, Bladder Management, Bowel Management, Disease Management/Prevention, Medication Management, Discharge Planning  PT  interventions Ambulation/gait training, Discharge planning, Functional mobility training, Therapeutic Activities, UE/LE Strength taining/ROM, Training and development officer, Community reintegration, Neuromuscular re-education, Barrister's clerk education, IT trainer, Therapeutic Exercise, UE/LE Coordination activities  OT Interventions Training and development officer, Disease mangement/prevention, Neuromuscular re-education, Self Care/advanced ADL retraining, Therapeutic Exercise, Wheelchair propulsion/positioning, Pain management, DME/adaptive equipment instruction, Cognitive remediation/compensation, UE/LE Strength taining/ROM, Academic librarian, Barrister's clerk education, UE/LE Coordination activities, Splinting/orthotics, Functional electrical stimulation, Discharge planning, Psychosocial support, Functional mobility training, Therapeutic Activities, Visual/perceptual remediation/compensation  SLP Interventions Cognitive remediation/compensation, Internal/external aids, Speech/Language facilitation, Therapeutic Activities, Environmental controls, Cueing hierarchy, Functional tasks, Patient/family education  TR Interventions    SW/CM Interventions Discharge Planning, Psychosocial Support, Patient/Family Education   Barriers to Discharge MD  Medical stability  Nursing Decreased caregiver support, Home environment access/layout, Incontinence, Lack of/limited family support, Weight 1 level, level entry. Children to provide care at discharge.  PT   1 STE, increased body habitus.  OT Incontinence, Insurance for SNF coverage, Weight, Nutrition means    SLP      SW       Team Discharge Planning: Destination: PT-Home ,OT- Home  , SLP-Home Projected Follow-up: PT-Home health PT, OT-  24 hour supervision/assistance, SLP-Home Health SLP, 24 hour supervision/assistance Projected Equipment Needs: PT-To be determined, OT- To be determined, SLP-None recommended by SLP Equipment Details: PT-pt has RW, OT-   Patient/family involved in discharge planning: PT- Patient, Family member/caregiver,  OT-Patient, Family member/caregiver, SLP-Patient, Family member/caregiver  MD ELOS: 21-25 days. Medical Rehab Prognosis:  Good Assessment: 81 year old right-handed female with history of nonobstructive CAD, hypertension, hypothyroidism, type II diabetes mellitus, hyperlipidemia, normocytic anemia/monoclonal gammopathy followed by  Dr. Irene Limbo, CKD stage IIIb- but GFR is 20.  Per chart review independent prior to admission occasional straight point cane due to left hip pain.  1 level home with level entry.  She has a son and daughter with good support.  Presented 04/23/2021 with acute onset of right side weakness and word finding difficulties.  Cranial CT scan showed 8 mm hypodensity left thalamus compatible with infarct of indeterminate age.  No acute hemorrhage.  MRA head and neck no intracranial large vessel occlusion identified.  Multiple sites of moderate/severe stenosis within the P2 left PCA.  Severe stenosis within the mid to distal M1 left MCA.  MRI follow-up small patchy acute cortical subcortical infarct within the left parietal lobe.  Background moderate to moderately advanced chronic small vessel ischemic changes within the cerebral white matter additionally, clustered chronic parenchymal microhemorrhages present within the right parieto-occipital lobes.  Patient did not receive tPA.  Admission chemistries unremarkable except chloride 113 glucose 160 BUN 49 creatinine 3.13, hemoglobin 9.7, alcohol negative, hemoglobin A1c 4.5, TSH 5.275.  Echocardiogram ejection fraction of 60 to 65% no wall motion abnormalities grade 1 diastolic dysfunction.  Neurology follow-up due to concerns for microhemorrhages DAPT not recommended and initially placed on aspirin alone.  Patient did develop some right hand twitching concern for focal seizure loaded with Keppra with resolution of tremor.  EEG not performed due to glued wig in  place.  Hospital course PAF with RVR received IV metoprolol and subsequently had episode of bradycardia with heart rate in the 50s with cardiology services consulted.  She was cleared to begin Eliquis for both atrial fibrillation and CVA prophylaxis and initial aspirin discontinued.  Cardiac rate remains controlled maintained on amiodarone 100 mg daily.  AKI on CKD with baseline creatinine 2.8 her HCTZ was on hold.  Noted mild leukocytosis 12,500 with urinalysis negative nitrite.  Patient was placed on Keflex empirically.  Tolerating a regular consistency diet.  Therapy evaluations completed due to patient decreased functional mobility right side weakness and word finding difficulties was admitted for a comprehensive rehab program.  We will set goals for min a with PT/OT/SLP.  Due to the current state of emergency, patients may not be receiving their 3-hours of Medicare-mandated therapy.  See Team Conference Notes for weekly updates to the plan of care

## 2021-05-05 DIAGNOSIS — N179 Acute kidney failure, unspecified: Secondary | ICD-10-CM

## 2021-05-05 DIAGNOSIS — R414 Neurologic neglect syndrome: Secondary | ICD-10-CM

## 2021-05-05 DIAGNOSIS — R739 Hyperglycemia, unspecified: Secondary | ICD-10-CM

## 2021-05-05 DIAGNOSIS — I10 Essential (primary) hypertension: Secondary | ICD-10-CM

## 2021-05-05 MED ORDER — SODIUM CHLORIDE 0.9 % IV SOLN: INTRAVENOUS | Status: DC

## 2021-05-05 NOTE — Progress Notes (Signed)
Speech Language Pathology Daily Session Note  Patient Details  Name: Rita Yoder MRN: [id number] Date of Birth: 1940-07-06  Today's Date: 05/05/2021 SLP Individual Time: 0805-0900 SLP Individual Time Calculation (min): 55 min  Short Term Goals: Week 1: SLP Short Term Goal 1 (Week 1): Patient will scan to left visual field to locate functional items in 25% of opportunities with Max A multimodal cues. SLP Short Term Goal 2 (Week 1): Patient will demonstrate orientation to month, place and situation with Max A multimodal cues. SLP Short Term Goal 3 (Week 1): Patient will initiate functional tasks in 50% of opportunities with Mod multimodal cues. SLP Short Term Goal 4 (Week 1): Patient will answer yes/no questions with 50% accuracy with Mod A multimodal cues. SLP Short Term Goal 5 (Week 1): Patient will follow basic commands in 50% of opportunities with Mod A multimodal cues. SLP Short Term Goal 6 (Week 1): Patient will name functional items/family members with 50% accuracy and Max A multimodal cues.  Skilled Therapeutic Interventions:  Pt was seen for skilled ST targeting goals for cognition and communication.  Pt was in bed upon arrival with granddaughter at bedside.  Pt was awake, alert, and agreeable to participating in treatment.  Pt had already eaten breakfast and needed max to total cues to complete oral hygiene due to what appeared to be motor planning deficits.  SLP noted that pt would spontaneously turn to look at therapist who was standing on her right side during informal conversations; however, she struggled to even bring her eyes to midline during structured table top tasks.  As a result, pt was unable to match targeted items to a word from a field of two or three, even when therapist used a vertical alignment to accommodate for visual scanning deficits.  Pt could respond at the phrase level to functional questions regarding immediate environmental and biographical information but  repeatedly answered "I don't know" during structured confrontational naming tasks.  Pt's granddaughter and SLP provided encouragement and reassurance throughout therapy session in addition to frequent simple explanations of task goals for improving functional communication in order to maximize pt participation in treatment.  Pt's granddaughter provided SLP with information regarding personally meaningful activities to incorporate into therapy sessions (music, cooking, grooming/getting nails done).  All questions were answered to pt's and family's satisfaction at this time.  Pt was left in bed with bed alarm set and granddaughter at bedside.  Continue per current plan of care.    Pain Pain Assessment Pain Scale: 0-10 Pain Score: 0-No pain  Therapy/Group: Individual Therapy  Cori Henningsen, Selinda Orion 05/05/2021, 12:22 PM

## 2021-05-05 NOTE — Progress Notes (Signed)
Physical Therapy Session Note  Patient Details  Name: Rita Yoder MRN: [id number] Date of Birth: 1940-06-27  Today's Date: 05/05/2021 PT Individual Time: 1511-1540 PT Individual Time Calculation (min): 29 min   Short Term Goals: Week 1:  PT Short Term Goal 1 (Week 1): Pt will roll side to side w/ mod A PT Short Term Goal 2 (Week 1): Pt will transfer sit to stand w/ mod A PT Short Term Goal 3 (Week 1): Pt will transfer sit to stand w/ mod A PT Short Term Goal 4 (Week 1): Pt will tolerate standing x 3' performing UE activity.  Skilled Therapeutic Interventions/Progress Updates: Pt presented in bed with son present agreeable to therapy. Pt denies pain during session. Session focused on bed mobility and forced use of hemi side. Performed supine to sit with modA and heavy use of bed features. Pt was able to reach for bed rail with RUE with cues and modA. Pt required heavy modA to scoot to EOB with increased time for motor planning. Pt then participated in scanning and reaching activities for forced use of RUE. Pt required max multimodal cues for scanning to R ultimately requiring PTA to sit on pt's right side to get pt to scan. With PTA on R side pt participated in reaching and was able to successfully tap therapists hand x 3. Pt would try to use LUE frequently but was able to be redirected to use R. Pt also participated in LAQ and hamstring pulls with manual resistance. Pt returned to supine with modA for BLE management and max multimodal cues for sequencing. Performed rolling L/R with minA for readjustment of pad and clothing. Pt left in bed at end of session with bed alarm on, call bell within reach and needs met.      Therapy Documentation Precautions:  Precautions Precautions: Fall Precaution Comments: Rt hemiparesis and Rt inattention Restrictions Weight Bearing Restrictions: No General:   Vital Signs: Therapy Vitals Temp: 97.9 F (36.6 C) Temp Source: Oral Pulse Rate:  67 Resp: 17 BP: (!) 173/63 Patient Position (if appropriate): Lying Oxygen Therapy SpO2: 100 % O2 Device: Room Air Pain: Pain Assessment Pain Scale: 0-10 Pain Score: 0-No pain Mobility:   Locomotion :    Trunk/Postural Assessment :    Balance:   Exercises:   Other Treatments:      Therapy/Group: Individual Therapy  [name] 05/05/2021, 3:56 PM

## 2021-05-05 NOTE — Progress Notes (Signed)
Occupational Therapy Session Note  Patient Details  Name: Rita Yoder MRN: [id number] Date of Birth: [birth year]  Today's Date: 05/05/2021 OT Individual Time: 0200-0245 OT Individual Time Calculation (min): 45 min    Short Term Goals: Week 1:  OT Short Term Goal 1 (Week 1): Pt will complete BSC trasnfer with +2 assist and LRAD OT Short Term Goal 2 (Week 1): Pt will complete LB dressing at sit<stand level with LRAD OT Short Term Goal 3 (Week 1): Pt will complete 1 component of UB dressing with Max A Week 2:     Skilled Therapeutic Interventions/Progress Updates:   Patient seen in room this treatment session at bed level, patient completed simple BADL task in washing face, brush her teeth,and comb her hair with s/u assist.  The pt was pleasantly confused and required constant vc's for task initiation and completion.  The pt was instructed in pulling herself from supine to sit incorporating the bed rails, the pt require Mod-MaxA for positioning and maintaining position. The pt completed shld flexion using a 2lb dowel 2 sets of 10 with rest breaks as needed, the pt required 2 rest breaks.  The pt was instructed in AROM to improve her compliance and to improve activity tolerance.  The pt was able to move bilateral UE in flexion and horizontal abduction 2 sets of 10. The pt reported no pain at the time of treatment, her call light was within reach and her bed alarm was activated.   Therapy Documentation Precautions:  Precautions Precautions: Fall Precaution Comments: Rt hemiparesis and Rt inattention Restrictions Weight Bearing Restrictions: No General:   Vital Signs:   Therapy/Group: Individual Therapy  Yvonne Kendall 05/05/2021, 2:58 PM

## 2021-05-05 NOTE — Progress Notes (Signed)
PROGRESS NOTE   Subjective/Complaints: Patient seen laying in bed this morning.  Granddaughter at bedside.  She states she slept well overnight.  She denies complaints.  ROS: Denies CP, SOB, N/V/D  Objective:   No results found. Recent Labs    05/03/21 0615 05/04/21 0938  WBC 14.3* 11.8*  HGB 9.8* 9.5*  HCT 29.8* 29.3*  PLT 294 307    Recent Labs    05/03/21 0615 05/04/21 0938  NA 135 134*  K 5.1 4.5  CL 105 105  CO2 19* 18*  GLUCOSE 102* 226*  BUN 72* 75*  CREATININE 3.06* 3.04*  CALCIUM 9.0 8.9     Intake/Output Summary (Last 24 hours) at 05/05/2021 4709 Last data filed at 05/04/2021 1728 Gross per 24 hour  Intake 298 ml  Output --  Net 298 ml         Physical Exam: Vital Signs Blood pressure (!) 142/50, pulse 66, temperature 98.9 F (37.2 C), temperature source Oral, resp. rate 18, height 5\' 6"  (1.676 m), weight 90.6 kg, SpO2 98 %. Constitutional:      Appearance: Obese HENT:     Head: Normocephalic and atraumatic.     Comments: No facial droop seen at rest however has R tongue deviation    Right Ear: External ear normal.     Left Ear: External ear normal.     Nose: Nose normal. No congestion.     Mouth/Throat:     Mouth: Mucous membranes are dry.     Pharynx: Oropharynx is clear. No oropharyngeal exudate.  Eyes:     General:        Right eye: No discharge.        Left eye: No discharge.     Conjunctiva/sclera: Conjunctivae normal.     Comments: Left gaze preference Unable to look R past midline  Cardiovascular:     Rate and Rhythm: Normal rate and regular rhythm.     Heart sounds: Normal heart sounds. No murmur heard.   No gallop.  Pulmonary:     Effort: Pulmonary effort is normal. No respiratory distress.     Breath sounds: Normal breath sounds. No wheezing or rales.  Abdominal:     General: There is distension.     Palpations: Abdomen is soft.     Tenderness: There is no  abdominal tenderness. There is no guarding.     Comments: Hypoactive BS  Musculoskeletal:     Cervical back: Neck supple. No tenderness.     Comments: RUE 4/5  RLE 3+/5- again neglect makes it difficult to test LUE/LLE 5-/5 Right neglect  Skin:    General: Skin is warm and dry.     Neurological:     Mental Status: Alert.  See above.  Assessment/Plan: 1. Functional deficits which require 3+ hours per day of interdisciplinary therapy in a comprehensive inpatient rehab setting. Physiatrist is providing close team supervision and 24 hour management of active medical problems listed below. Physiatrist and rehab team continue to assess barriers to discharge/monitor patient progress toward functional and medical goals  Care Tool:  Bathing        Body parts bathed by helper: Right arm, Left  arm, Chest, Abdomen, Front perineal area, Buttocks, Right upper leg, Left upper leg, Left lower leg, Right lower leg, Face     Bathing assist Assist Level: 2 Helpers     Upper Body Dressing/Undressing Upper body dressing   What is the patient wearing?: Dress    Upper body assist Assist Level: 2 Helpers    Lower Body Dressing/Undressing Lower body dressing      What is the patient wearing?: Incontinence brief     Lower body assist Assist for lower body dressing: 2 Helpers     Toileting Toileting    Toileting assist Assist for toileting: 2 Helpers     Transfers Chair/bed transfer  Transfers assist  Chair/bed transfer activity did not occur: Safety/medical concerns        Locomotion Ambulation   Ambulation assist   Ambulation activity did not occur: Safety/medical concerns          Walk 10 feet activity   Assist  Walk 10 feet activity did not occur: Safety/medical concerns        Walk 50 feet activity   Assist Walk 50 feet with 2 turns activity did not occur: Safety/medical concerns         Walk 150 feet activity   Assist Walk 150 feet activity did  not occur: Safety/medical concerns         Walk 10 feet on uneven surface  activity   Assist Walk 10 feet on uneven surfaces activity did not occur: Safety/medical concerns         Wheelchair     Assist Is the patient using a wheelchair?: No   Wheelchair activity did not occur: Safety/medical concerns         Wheelchair 50 feet with 2 turns activity    Assist    Wheelchair 50 feet with 2 turns activity did not occur: Safety/medical concerns       Wheelchair 150 feet activity     Assist  Wheelchair 150 feet activity did not occur: Safety/medical concerns       Blood pressure (!) 142/50, pulse 66, temperature 98.9 F (37.2 C), temperature source Oral, resp. rate 18, height 5\' 6"  (1.676 m), weight 90.6 kg, SpO2 98 %.  Medical Problem List and Plan: 1. Functional deficits secondary to acute multifocal infarct including left ischemic thalamus, left parietal cortical and subcortical infarcts             Continue CIR 2.  Antithrombotics: -DVT/anticoagulation:  Pharmaceutical: Other (comment) Eliquis             -antiplatelet therapy: N/A 3. Back pain: kpad ordered 4. Mood: Provide emotional support             -antipsychotic agents: N/A 5. Neuropsych: This patient is not capable of making decisions on her own behalf. 6. Skin/Wound Care: Routine skin checks 7. Fluids/Electrolytes/Nutrition: Routine in and outs 8.  New onset atrial fibrillation with RVR.  Amiodarone 100 mg daily.  Follow-up cardiology services.  Cardiac rate controlled  Monitor with increased exertion 9.  Hypertension.  Norvasc 5 mg daily, Imdur 30 mg daily.  Monitor with increased mobility  Relatively controlled on 3/26 10.  Suspicion for focal seizure.  Continue Keppra 500 mg twice daily.  EEG not completed due to glued wig could not be removed. 11.  AKI on CKD stage IIIB- GFR 20.  Creatinine baseline 2.8.  HCTZ on hold.  Creatinine 3.04 on 2/25, labs ordered for tomorrow  Encourage  fluids  IVF x24  started on 2/26, echo reviewed with EF 60-65% with grade 1 diastolic dysfunction 12.  Hyperlipidemia.  Continue Lipitor 13.  Hypothyroidism.  Synthroid 14.  Normocytic anemia/monoclonal gammopathy.  Follow-up hematology services Dr.Kale 15.  Constipation.  MiraLAX twice daily.  Senokot daily. 16.  Suspect UTI.  Urinalysis negative nitrite.  Empiric Keflex. 17.Type 2 diabetes mellitus.HG  AIC 4.5   SSI discontinued  ?  Blood glucose of 226 on 2/25, will continue to monitor 18. Transaminitis: tylenol d/ced 19. Leukocytosis: denies dysuria, shortness of breath. Afebrile.   WBCs 11.8 on 2/25, labs ordered for tomorrow  LOS: 3 days A FACE TO FACE EVALUATION WAS PERFORMED  Rishan Oyama Lorie Phenix 05/05/2021, 8:08 AM

## 2021-05-05 NOTE — Progress Notes (Addendum)
Physical Therapy Session Note  Patient Details  Name: Rita Yoder MRN: [id number] Date of Birth: 01-29-1941  Today's Date: 05/05/2021 PT Individual Time: 1105-1215 PT Individual Time Calculation (min): 70 min   Short Term Goals: Week 1:  PT Short Term Goal 1 (Week 1): Pt will roll side to side w/ mod A PT Short Term Goal 2 (Week 1): Pt will transfer sit to stand w/ mod A PT Short Term Goal 3 (Week 1): Pt will transfer sit to stand w/ mod A PT Short Term Goal 4 (Week 1): Pt will tolerate standing x 3' performing UE activity.  Skilled Therapeutic Interventions/Progress Updates:     Patient in bed with her granddaughter at bedside upon PT arrival. Patient alert and agreeable to PT session. Patient denied pain during session.  Spent increased time discussing patient's current grief from loss of independence and fear of mobility due to fear of pain and falling during session. Provided education on how to progress mobility, coping strategies for grief, and motivational factors that promote recovery. Patient and her granddaughter appreciative and receptive.   Patient and her granddaughter report patient had increased discomfort and banding with TED hose. Applied 1x4" and 1x6" ACE wraps to B lower extremities from met heads to tibial tuberosity with total A for edema control. Educated patient and her granddaughter of purpose of wraps and wrapping technique throughout. RN aware and in agreement.   Focused session on bed mobility and sitting balance, as patient was not ready to attempt standing today. Spent time with patient in sitting discussing plan for standing tomorrow with +2 assist and Clarise Cruz Plus for increased safety and patient confidence. Patient in agreement to attempt standing tomorrow.   Noted patient's expressive aphasia to increase with fatigue throughout session.   Therapeutic Activity: Bed Mobility: Patient performed rolling R/L and supine to/from sit with mod-max A in a flat  bed with use of bed rails. Provided verbal cues for sequencing, use of R hemi-body with max tactile and verbal cues, use of elbows for trunk control, and bringing knees to chest to bring legs on/off the bed. Patient performed scooting up in bed x2 with max A +2 and facilitation for pushing through B lower extremities in hook-lying.   Neuromuscular Re-ed: Patient performed the following sitting balance activities: -static sitting balance >15 min during discussions above, tolerated sitting unsupported well with supervision and intermittent cues for erect posture -R shoulder flexion to 90 deg with min A through range, more for initiation x10 -contralateral reaching alternating R/L with min A on R 2x10 -B knee extension L x10, R 2x10 with max cues for use of R leg without assist through full range -lateral scoot with mod-min A to the L for improved placement in bed before lying down, provided cues and facilitation for R arm use and head-hips relationship  Patient and her granddaughter requesting patient have limited visitor access, will discuss with nursing staff.  Patient in bed with her granddaughter at bedside at end of session with breaks locked, bed alarm set, and all needs within reach.   Therapy Documentation Precautions:  Precautions Precautions: Fall Precaution Comments: Rt hemiparesis and Rt inattention Restrictions Weight Bearing Restrictions: No    Therapy/Group: Individual Therapy  Janeya Deyo L Annlouise Gerety PT, DPT  05/05/2021, 12:57 PM

## 2021-05-06 DIAGNOSIS — I4891 Unspecified atrial fibrillation: Secondary | ICD-10-CM

## 2021-05-06 LAB — CBC WITH DIFFERENTIAL/PLATELET
Abs Immature Granulocytes: 0.09 K/uL — ABNORMAL HIGH (ref 0.00–0.07)
Basophils Absolute: 0.1 K/uL (ref 0.0–0.1)
Basophils Relative: 1 %
Eosinophils Absolute: 0.3 K/uL (ref 0.0–0.5)
Eosinophils Relative: 2 %
HCT: 29.7 % — ABNORMAL LOW (ref 36.0–46.0)
Hemoglobin: 9.5 g/dL — ABNORMAL LOW (ref 12.0–15.0)
Immature Granulocytes: 1 %
Lymphocytes Relative: 21 %
Lymphs Abs: 2.8 K/uL (ref 0.7–4.0)
MCH: 32 pg (ref 26.0–34.0)
MCHC: 32 g/dL (ref 30.0–36.0)
MCV: 100 fL (ref 80.0–100.0)
Monocytes Absolute: 1.2 K/uL — ABNORMAL HIGH (ref 0.1–1.0)
Monocytes Relative: 9 %
Neutro Abs: 9 K/uL — ABNORMAL HIGH (ref 1.7–7.7)
Neutrophils Relative %: 66 %
Platelets: 370 K/uL (ref 150–400)
RBC: 2.97 MIL/uL — ABNORMAL LOW (ref 3.87–5.11)
RDW: 11.9 % (ref 11.5–15.5)
WBC: 13.3 K/uL — ABNORMAL HIGH (ref 4.0–10.5)
nRBC: 0 % (ref 0.0–0.2)

## 2021-05-06 LAB — COMPREHENSIVE METABOLIC PANEL
ALT: 64 U/L — ABNORMAL HIGH (ref 0–44)
AST: 47 U/L — ABNORMAL HIGH (ref 15–41)
Albumin: 2.5 g/dL — ABNORMAL LOW (ref 3.5–5.0)
Alkaline Phosphatase: 80 U/L (ref 38–126)
Anion gap: 9 (ref 5–15)
BUN: 76 mg/dL — ABNORMAL HIGH (ref 8–23)
CO2: 20 mmol/L — ABNORMAL LOW (ref 22–32)
Calcium: 9 mg/dL (ref 8.9–10.3)
Chloride: 109 mmol/L (ref 98–111)
Creatinine, Ser: 2.93 mg/dL — ABNORMAL HIGH (ref 0.44–1.00)
GFR, Estimated: 16 mL/min — ABNORMAL LOW (ref >60)
Glucose, Bld: 119 mg/dL — ABNORMAL HIGH (ref 70–99)
Potassium: 4.7 mmol/L (ref 3.5–5.1)
Sodium: 138 mmol/L (ref 135–145)
Total Bilirubin: 0.4 mg/dL (ref 0.3–1.2)
Total Protein: 6.3 g/dL — ABNORMAL LOW (ref 6.5–8.1)

## 2021-05-06 MED ORDER — SODIUM CHLORIDE 0.9 % IV SOLN: INTRAVENOUS | Status: DC

## 2021-05-06 NOTE — Progress Notes (Signed)
Occupational Therapy Session Note  Patient Details  Name: Rita Yoder MRN: [id number] Date of Birth: [birth year]  Today's Date: 05/06/2021 OT Individual Time: 1305-1400 OT Individual Time Calculation (min): 55 min    Short Term Goals: Week 1:  OT Short Term Goal 1 (Week 1): Pt will complete BSC trasnfer with +2 assist and LRAD OT Short Term Goal 2 (Week 1): Pt will complete LB dressing at sit<stand level with LRAD OT Short Term Goal 3 (Week 1): Pt will complete 1 component of UB dressing with Max A  Skilled Therapeutic Interventions/Progress Updates:    Session focused on motor planning, functional mobility EOB, sequencing, and attention. Pt received supine with her granddaughter present. Increased time provided throughout session to allow pt to initiate and problem solve through commands given, as well as as to build therapeutic rapport. Mod cueing for bed mobility to EOB and mod A overall. Pt with severe visual midline awareness and R scanning/attention. She was able to sit EOB with (S) overall. Attempted scooting EOB to recliner, with facilitation required for head-hips relationship. She was able to complete 3 scoots with max-total A overall. Worked on forward weight shift in prep for transfers as well, with use of rocking to facilitate initiation and increase pt confidence in her abilities. She returned to EOB and required max +2 A to scoot up in bed and be positioned flat. She was left supine with all needs met, bed alarm set. Collaborated with granddaughter on best way to motivate pt and she recommended more stern approach.   Therapy Documentation Precautions:  Precautions Precautions: Fall Precaution Comments: Rt hemiparesis and Rt inattention Restrictions Weight Bearing Restrictions: No  Therapy/Group: Individual Therapy  Curtis Sites 05/06/2021, 6:23 AM

## 2021-05-06 NOTE — Progress Notes (Signed)
Speech Language Pathology Daily Session Note  Patient Details  Name: Rita Yoder MRN: [id number] Date of Birth: [birth year]  Today's Date: 05/06/2021 SLP Individual Time: 5498-2641 SLP Individual Time Calculation (min): 55 min  Short Term Goals: Week 1: SLP Short Term Goal 1 (Week 1): Patient will scan to left visual field to locate functional items in 25% of opportunities with Max A multimodal cues. SLP Short Term Goal 2 (Week 1): Patient will demonstrate orientation to month, place and situation with Max A multimodal cues. SLP Short Term Goal 3 (Week 1): Patient will initiate functional tasks in 50% of opportunities with Mod multimodal cues. SLP Short Term Goal 4 (Week 1): Patient will answer yes/no questions with 50% accuracy with Mod A multimodal cues. SLP Short Term Goal 5 (Week 1): Patient will follow basic commands in 50% of opportunities with Mod A multimodal cues. SLP Short Term Goal 6 (Week 1): Patient will name functional items/family members with 50% accuracy and Max A multimodal cues.  Skilled Therapeutic Interventions: Skilled treatment session focused on communication goals. Upon arrival, patient was awake while supine in bed. Patient initially declined participation due to feeling tired and having "pain all over." SLP engaged patient in an informal conversation that focused on word-finding, visual scanning to right and recall of information from previous therapy events. Patient verbalizing at both the word and phrase level with extra time and overall Min verbal cues needed for word-finding. Patient also able to scan to her right field of environment to look at the window and answer questions regarding her environment.  Intermittent language of confusion noted throughout task. Patient left upright in bed with alarm on and all needs within reach. Continue with current plan of care.      Pain 10/10 chronic pain, "all over." RN aware  Therapy/Group: Individual  Therapy  Adrean Heitz, [place] 05/06/2021, 3:21 PM

## 2021-05-06 NOTE — Progress Notes (Signed)
PROGRESS NOTE   Subjective/Complaints: Pt without new issues. Appears to have slept. Nurse gave meds crushed in applesauce. Still pocketed remnants of pills. Was able to clear with several sips of juice  ROS: Limited due to cognitive/behavioral   Objective:   No results found. Recent Labs    05/04/21 0938 05/06/21 0758  WBC 11.8* 13.3*  HGB 9.5* 9.5*  HCT 29.3* 29.7*  PLT 307 370   Recent Labs    05/04/21 0938 05/06/21 0758  NA 134* 138  K 4.5 4.7  CL 105 109  CO2 18* 20*  GLUCOSE 226* 119*  BUN 75* 76*  CREATININE 3.04* 2.93*  CALCIUM 8.9 9.0    Intake/Output Summary (Last 24 hours) at 05/06/2021 6948 Last data filed at 05/05/2021 1800 Gross per 24 hour  Intake 397 ml  Output --  Net 397 ml        Physical Exam: Vital Signs Blood pressure (!) 148/58, pulse 62, temperature 97.7 F (36.5 C), temperature source Oral, resp. rate 18, height 5\' 6"  (1.676 m), weight 90.6 kg, SpO2 100 %. Musculoskeletal:     Cervical back: Neck supple. No tenderness.  Neuro:  Alert and Oriented to person, place, stroke. Right central 7, reasonable tongue control. Left gaze preference. RUE 4- to 5/6. RLE 3-4/5 LUE/LLE 4+/5. Decreased light touch on right. Will initiate with right side with extra cueing. Decreased LT, hypersensitivity in stocking glove distribution below knees bilaterally. DTR's 1+ Skin:    General: Skin is warm and dry.    Assessment/Plan: 1. Functional deficits which require 3+ hours per day of interdisciplinary therapy in a comprehensive inpatient rehab setting. Physiatrist is providing close team supervision and 24 hour management of active medical problems listed below. Physiatrist and rehab team continue to assess barriers to discharge/monitor patient progress toward functional and medical goals  Care Tool:  Bathing        Body parts bathed by helper: Right arm, Left arm, Chest, Abdomen, Front  perineal area, Buttocks, Right upper leg, Left upper leg, Left lower leg, Right lower leg, Face     Bathing assist Assist Level: 2 Helpers     Upper Body Dressing/Undressing Upper body dressing   What is the patient wearing?: Hospital gown only    Upper body assist Assist Level: 2 Helpers    Lower Body Dressing/Undressing Lower body dressing      What is the patient wearing?: Incontinence brief     Lower body assist Assist for lower body dressing: 2 Helpers     Toileting Toileting    Toileting assist Assist for toileting: 2 Helpers     Transfers Chair/bed transfer  Transfers assist  Chair/bed transfer activity did not occur: Safety/medical concerns        Locomotion Ambulation   Ambulation assist   Ambulation activity did not occur: Safety/medical concerns          Walk 10 feet activity   Assist  Walk 10 feet activity did not occur: Safety/medical concerns        Walk 50 feet activity   Assist Walk 50 feet with 2 turns activity did not occur: Safety/medical concerns  Walk 150 feet activity   Assist Walk 150 feet activity did not occur: Safety/medical concerns         Walk 10 feet on uneven surface  activity   Assist Walk 10 feet on uneven surfaces activity did not occur: Safety/medical concerns         Wheelchair     Assist Is the patient using a wheelchair?: No   Wheelchair activity did not occur: Safety/medical concerns         Wheelchair 50 feet with 2 turns activity    Assist    Wheelchair 50 feet with 2 turns activity did not occur: Safety/medical concerns       Wheelchair 150 feet activity     Assist  Wheelchair 150 feet activity did not occur: Safety/medical concerns       Blood pressure (!) 148/58, pulse 62, temperature 97.7 F (36.5 C), temperature source Oral, resp. rate 18, height 5\' 6"  (1.676 m), weight 90.6 kg, SpO2 100 %.  Medical Problem List and Plan: 1. Functional  deficits secondary to acute multifocal infarct including left ischemic thalamus, left parietal cortical and subcortical infarcts             -Continue CIR therapies including PT, OT, and SLP  2.  Antithrombotics: -DVT/anticoagulation:  Pharmaceutical: Other (comment) Eliquis             -antiplatelet therapy: N/A 3. Back pain: kpad ordered 4. Mood: Provide emotional support             -antipsychotic agents: N/A 5. Neuropsych: This patient is not capable of making decisions on her own behalf. 6. Skin/Wound Care: Routine skin checks 7. Fluids/Electrolytes/Nutrition: Routine in and outs 8.  New onset atrial fibrillation with RVR.  Amiodarone 100 mg daily.  Follow-up cardiology services.  Cardiac rate presently controlled    9.  Hypertension.  Norvasc 5 mg daily, Imdur 30 mg daily.  Monitor with increased mobility  Relatively controlled on 2/27 10.  Suspicion for focal seizure.  Continue Keppra 500 mg twice daily.  EEG not completed due to glued wig could not be removed. 11.  AKI on CKD stage IIIB- GFR 20.  Creatinine baseline 2.8.  HCTZ on hold.  2/27 BUN still quite elevated    -will continue IVF at HS only   -encourage PO's   -recheck labs Wednesday 12.  Hyperlipidemia.  Continue Lipitor 13.  Hypothyroidism.  Synthroid 14.  Normocytic anemia/monoclonal gammopathy.  Follow-up hematology services Dr.Kale 15.  Constipation.  MiraLAX twice daily.  Senokot daily. 16.  Suspect UTI.  Urinalysis negative nitrite.  Empiric Keflex. 17.Type 2 diabetes mellitus.HG  AIC 4.5   SSI discontinued  ?  Blood glucose of 226 on 2/25. Check periodically 18. Transaminitis: tylenol d/ced 19. Leukocytosis: denies dysuria, shortness of breath. Afebrile.    -2/27 persistent elevation. No signs of infection  -continue to monitor LOS: 4 days A FACE TO FACE EVALUATION WAS PERFORMED  Meredith Staggers 05/06/2021, 9:22 AM

## 2021-05-06 NOTE — Progress Notes (Signed)
Physical Therapy Session Note  Patient Details  Name: Rita Yoder MRN: [id number] Date of Birth: [birth year]  Today's Date: 05/06/2021 PT Individual Time: 0800-0910 PT Individual Time Calculation (min): 70 min   Short Term Goals: Week 1:  PT Short Term Goal 1 (Week 1): Pt will roll side to side w/ mod A PT Short Term Goal 2 (Week 1): Pt will transfer sit to stand w/ mod A PT Short Term Goal 3 (Week 1): Pt will transfer sit to stand w/ mod A PT Short Term Goal 4 (Week 1): Pt will tolerate standing x 3' performing UE activity.  Skilled Therapeutic Interventions/Progress Updates:     Patient in bed with her granddaughter in the room upon PT arrival. Patient alert and agreeable to PT session. Patient reported un-rated pain "all over," specifically her low back and hips, during session, RN made aware. PT provided repositioning, rest breaks, and distraction as pain interventions throughout session.   Patient presents with intermittent language of confusion with simple 2-3 word phrases, most clear with yes/no answers throughout session. Patient reports good sleep last night and her granddaughter reporting that the patient only took a couple bites of breakfast and a couple sips of juice this morning.   Patient reported increased comfort with B ACE wraps for edema control versus TED hose yesterday. Applied 1x4" and 1x6" ACE wraps to B lower extremities from met heads to tibial tuberosity with total A. Educated patient and her granddaughter of purpose of wraps and wrapping technique throughout. MD rounded during ACE wrap application and approved wraps for patient based on her preference.   Patient incontinent of bladder at beginning of session.   Therapeutic Activity: Bed Mobility: Patient performed rolling R/L with min-mod A +2 with use of bed rails to change incontinence brief and perform peri-care with total A. Noted increased joint stiffness and R lower extremity tone this morning. Provided  gentile PROM of B lower extremities prior to mobility for improved movement. Noted increased activation of R lower extremity with bed mobility after. She performed supine to/from sit with mod A with increased time to follow cues and motor plan with hand-over-hand assist for limb placement for progressing through side-lying to sitting/lying.   Patient sat EOB >10 min with supervision for sitting balance as PT demonstrated use of Sara Plus for initiating standing tolerance. Patient able to look and focus to the R >5 sec x2 with max verbal cues during demonstration. Patient in agreement to try the [facility], however, did not recall the discussion PT had with her yesterday about standing.  Place sling and initiated standing in the [place] plus, however, patient with severe anxiety and asking to stop once hips began to slide forward to come to standing. Patient was returned to sitting EOB without risk of falling. Patient requested not to use the [facility] Plus any more due to fear of falling.   Spent increased time educating patient on benefits to transfer OOB and of initiating standing. Reassured patient that equipment used would be safe for the patient's current level of function. Patient very fearful about standing, but agreed to work on lateral scooting in hopes to initiate use of a slide board for transfers. Patient performed lateral scooting L x3 with mod-max A with 1-2-3 count for initiation and timing and facilitation of head-hips relationship and hand and foot placement.   Patient in bed at end of session with breaks locked, bed alarm set, and all needs within reach. Retrieved 22"x18" manual w/c with  foam cushion for improved sitting tolerance and to promote OOB mobility.   Therapy Documentation Precautions:  Precautions Precautions: Fall Precaution Comments: Rt hemiparesis and Rt inattention Restrictions Weight Bearing Restrictions: No    Therapy/Group: Individual Therapy  Colm Lyford L Finnlee Guarnieri PT,  DPT  05/06/2021, 3:49 PM

## 2021-05-07 MED ORDER — MUSCLE RUB 10-15 % EX CREA
TOPICAL_CREAM | Freq: Two times a day (BID) | CUTANEOUS | Status: DC
  Administered 2021-05-21: 1 via TOPICAL
  Filled 2021-05-07: qty 85

## 2021-05-07 NOTE — Progress Notes (Signed)
PROGRESS NOTE   Subjective/Complaints: Feels achy all over, especially left shoulder area. PT noted left leg looks more swollen.    ROS: Limited due to cognitive/behavioral   Objective:   No results found. Recent Labs    05/04/21 0938 05/06/21 0758  WBC 11.8* 13.3*  HGB 9.5* 9.5*  HCT 29.3* 29.7*  PLT 307 370   Recent Labs    05/04/21 0938 05/06/21 0758  NA 134* 138  K 4.5 4.7  CL 105 109  CO2 18* 20*  GLUCOSE 226* 119*  BUN 75* 76*  CREATININE 3.04* 2.93*  CALCIUM 8.9 9.0    Intake/Output Summary (Last 24 hours) at 05/07/2021 0739 Last data filed at 05/06/2021 1814 Gross per 24 hour  Intake 338 ml  Output --  Net 338 ml        Physical Exam: Vital Signs Blood pressure (!) 160/55, pulse 68, temperature 98.6 F (37 C), temperature source Oral, resp. rate 16, height 5\' 6"  (1.676 m), weight 90.6 kg, SpO2 100 %. Constitutional: No distress . Vital signs reviewed. HEENT: NCAT, EOMI, oral membranes moist Neck: supple Cardiovascular: RRR without murmur. No JVD    Respiratory/Chest: CTA Bilaterally without wheezes or rales. Normal effort    GI/Abdomen: BS +, non-tender, non-distended Ext: no clubbing, cyanosis, 1+ edema RLE, 2+ LLE Psych: pleasant and cooperative  Musculoskeletal:     Cervical back: left scapular area tender to palp, +/- rhomboids. Arm itself did not appear tender.   Neuro:  Alert and Oriented to person, place, stroke. Right central 7, reasonable tongue control. Left gaze preference. RUE 4- to 5/6. RLE 3-4/5 LUE/LLE 4+/5. Decreased light touch on right. Delayed initiation with right side.  Decreased LT, hypersensitivity in stocking glove distribution below knees bilaterally. DTR's 1+ Skin:    General: Skin is warm and dry.    Assessment/Plan: 1. Functional deficits which require 3+ hours per day of interdisciplinary therapy in a comprehensive inpatient rehab setting. Physiatrist is  providing close team supervision and 24 hour management of active medical problems listed below. Physiatrist and rehab team continue to assess barriers to discharge/monitor patient progress toward functional and medical goals  Care Tool:  Bathing        Body parts bathed by helper: Right arm, Left arm, Chest, Abdomen, Front perineal area, Buttocks, Right upper leg, Left upper leg, Left lower leg, Right lower leg, Face     Bathing assist Assist Level: 2 Helpers     Upper Body Dressing/Undressing Upper body dressing   What is the patient wearing?: Hospital gown only    Upper body assist Assist Level: 2 Helpers    Lower Body Dressing/Undressing Lower body dressing      What is the patient wearing?: Incontinence brief     Lower body assist Assist for lower body dressing: 2 Helpers     Toileting Toileting    Toileting assist Assist for toileting: 2 Helpers     Transfers Chair/bed transfer  Transfers assist  Chair/bed transfer activity did not occur: Safety/medical concerns        Locomotion Ambulation   Ambulation assist   Ambulation activity did not occur: Safety/medical concerns  Walk 10 feet activity   Assist  Walk 10 feet activity did not occur: Safety/medical concerns        Walk 50 feet activity   Assist Walk 50 feet with 2 turns activity did not occur: Safety/medical concerns         Walk 150 feet activity   Assist Walk 150 feet activity did not occur: Safety/medical concerns         Walk 10 feet on uneven surface  activity   Assist Walk 10 feet on uneven surfaces activity did not occur: Safety/medical concerns         Wheelchair     Assist Is the patient using a wheelchair?: Yes Type of Wheelchair: Manual Wheelchair activity did not occur: Safety/medical concerns         Wheelchair 50 feet with 2 turns activity    Assist    Wheelchair 50 feet with 2 turns activity did not occur:  Safety/medical concerns       Wheelchair 150 feet activity     Assist  Wheelchair 150 feet activity did not occur: Safety/medical concerns       Blood pressure (!) 160/55, pulse 68, temperature 98.6 F (37 C), temperature source Oral, resp. rate 16, height 5\' 6"  (1.676 m), weight 90.6 kg, SpO2 100 %.  Medical Problem List and Plan: 1. Functional deficits secondary to acute multifocal infarct including left ischemic thalamus, left parietal cortical and subcortical infarcts             -Continue CIR therapies including PT, OT, and SLP. Interdisciplinary team conference today to discuss goals, barriers to discharge, and dc planning.    2.  Antithrombotics: -DVT/anticoagulation:  Pharmaceutical: Other (comment) Eliquis             -antiplatelet therapy: N/A  -pt with left leg swelling but already on eliquis. Really no need to check dopplers at this point 3. Back pain/left shoulder girdle pain: continue kpad  2/28-add muscle rub for shoulder girdle, may be from overuse/technique with transfers and mobility 4. Mood: Provide emotional support             -antipsychotic agents: N/A 5. Neuropsych: This patient is not capable of making decisions on her own behalf. 6. Skin/Wound Care: Routine skin checks 7. Fluids/Electrolytes/Nutrition: Routine in and outs 8.  New onset atrial fibrillation with RVR.  Amiodarone 100 mg daily.  Follow-up cardiology services.  Cardiac rate presently controlled    9.  Hypertension.  Norvasc 5 mg daily, Imdur 30 mg daily.  Monitor with increased mobility  Borderline control 2/28 10.  Suspicion for focal seizure.  Continue Keppra 500 mg twice daily.  EEG not completed due to glued wig could not be removed. 11.  AKI on CKD stage IIIB- GFR 20.  Creatinine baseline 2.8.  HCTZ on hold.  2/28 BUN still quite elevated    - continue IVF at HS only   -encourage PO's   -recheck labs tomorrow 12.  Hyperlipidemia.  Continue Lipitor 13.  Hypothyroidism.   Synthroid 14.  Normocytic anemia/monoclonal gammopathy.  Follow-up hematology services Dr.Kale 15.  Constipation.  MiraLAX twice daily.  Senokot daily. 16.  Suspect UTI.  Urinalysis negative nitrite.  Empiric Keflex. 17.Type 2 diabetes mellitus.HG  AIC 4.5   SSI discontinued  ?  Blood glucose of 226 on 2/25. Check periodically 18. Transaminitis: tylenol d/ced 19. Leukocytosis:      -  persistent elevation. No signs of infection  -continue to monitor LOS: 5 days A  FACE TO FACE EVALUATION WAS PERFORMED  Meredith Staggers 05/07/2021, 7:39 AM

## 2021-05-07 NOTE — Patient Care Conference (Signed)
Inpatient RehabilitationTeam Conference and Plan of Care Update Date: 05/07/2021   Time: 10:09 AM    Patient Name: Rita Yoder      Medical Record Number: [id number]  Date of Birth: 1940-06-07 Sex: Female         Room/Bed: 4W03C/4W03C-01 Payor Info: Payor: [employer] MEDICARE / Plan: [facility] MEDICARE / Product Type: *No Product type* /    Admit Date/Time:  05/02/2021 12:24 PM  Primary Diagnosis:  Subcortical infarction [facility])  Hospital Problems: Principal Problem:   Subcortical infarction [facility]) Active Problems:   AKI (acute kidney injury) ([place])   Right-sided visual neglect   Hyperglycemia    Expected Discharge Date: Expected Discharge Date:  (TBD)  Team Members Present: Physician leading conference: Dr. Alger Simons Social Worker Present: Loralee Pacas, [place] Nurse Present: Dorthula Nettles, RN PT Present: Apolinar Junes, PT OT Present: Laverle Hobby, OT SLP Present: Weston Anna, SLP PPS Coordinator present : Gunnar Fusi, SLP     Current Status/Progress Goal Weekly Team Focus  Bowel/Bladder   incontinent b/b  regain continence  toilet q 2hr and prn   Swallow/Nutrition/ Hydration             ADL's   Severe motor planning and cognitive deficits, unwilling to attempt transfer. Severe R inattention, Max A overall ADLs  min A overall  midline awareness, visual scanning, RUE NMR, transfers, cognitive retraining, ADLs   Mobility   Mod A bed mobility with use of bed rails, supervision sitting balance, significant fear of falling and motor planning deficits limiting progress with transfers at this time  Min A overall, gait 50 ft  R hemi-body attention and NMR, activity tolerance, sitting balance, OOB tolerance, transfer training, standing tolerance as able.   Communication   Min-Mod A  Min A  word-finding, verbal expression at the sentence level   Safety/Cognition/ Behavioral Observations  Mod-Max A  Min A  orientation, functional problem solving,  initiation, visual scanning to right, sustained attention   Pain   6/10 right shoulder pain reported  pain < 3  assess pain q 4hr and prn   Skin   CDI  new breakdown or infection while on CIR  assess skin q shift and prn     Discharge Planning:  Return with her dtr Rita Yoder who works from home, and pt will continue to have support from all children who will alternate their schedule to make sure her care needs are met.   Team Discussion: Left side subcortical/thalamic infarction. Hypersensitive pain to legs. CKD, worsening fluid intake. Incontinent B/B, scheduled Miralax. Pain reported, Muscle rub added, and K-pad also available. Continuous fluids from 1900 to 0700. Discharging home with daughter, son to be primary caregiver.   Patient on target to meet rehab goals: Min assist goals. Currently refusing transfers. Severe right inattention. Severe motor/cognitive impairments. Max assist ADL's, total assist transfers. Very anxious. Good language improvements. Scanning right improving. Had a good conversation about progressing and can't if staying in bed.  *See Care Plan and progress notes for long and short-term goals.   Revisions to Treatment Plan:  Medication adjustments   Teaching Needs: Family education, bowel/bladder management, medication/pain management, safety awareness, transfer/gait training, etc.   Current Barriers to Discharge: Decreased caregiver support, Incontinence, Lack of/limited family support, Behavior, and pain, anxiety, cognition.  Possible Resolutions to Barriers: Family education Follow-up PT/OT/SLP Order recommended DME Time toileting schedule     Medical Summary Current Status: left parietal, thalamic infarcts with right hemiparesis. A/CKD now receiving IVF. pain  issues in legs and left shoulder girdle  Barriers to Discharge: Medical stability   Possible Resolutions to Barriers/Weekly Focus: pain mgt, fluid resuscitation, daily assessment of labs and pt  data   Continued Need for Acute Rehabilitation Level of Care: The patient requires daily medical management by a physician with specialized training in physical medicine and rehabilitation for the following reasons: Direction of a multidisciplinary physical rehabilitation program to maximize functional independence : Yes Medical management of patient stability for increased activity during participation in an intensive rehabilitation regime.: Yes Analysis of laboratory values and/or radiology reports with any subsequent need for medication adjustment and/or medical intervention. : Yes   I attest that I was present, lead the team conference, and concur with the assessment and plan of the team.   Cristi Loron 05/07/2021, 2:13 PM

## 2021-05-07 NOTE — Progress Notes (Signed)
Physical Therapy Session Note  Patient Details  Name: Rita Yoder MRN: [id number] Date of Birth: 06/02/1940  Today's Date: 05/07/2021 PT Individual Time: [phone] PT Individual Time Calculation (min): 53 min   Short Term Goals: Week 1:  PT Short Term Goal 1 (Week 1): Pt will roll side to side w/ mod A PT Short Term Goal 2 (Week 1): Pt will transfer sit to stand w/ mod A PT Short Term Goal 3 (Week 1): Pt will transfer sit to stand w/ mod A PT Short Term Goal 4 (Week 1): Pt will tolerate standing x 3' performing UE activity.  Skilled Therapeutic Interventions/Progress Updates:     Patient in bed with her granddaughter in the room and RN providing meds upon PT arrival. Patient reported severe "all over pain" notable grimacing with changes in back position, L shoulder palpation near scapula, and L leg palpation and movement. Notable increased L lower extremity edema today compared to yesterday. Applied ACE wraps to B lower extremities for edema control. Patient's granddaughter present throughout the night and reports patient was restless with poor sleep and grimacing with L side movement. RN and MD made aware. PT provided repositioning, rest breaks, and distraction as pain interventions throughout session. K pad in place at L shoulder.   MD and PT encouraged patient to increase mobility for improved pain and edema management. Patient very resistant to mobility due to elevated pain and fatigue levels this morning. Provided education of options for goals of care focused on increased independence and progression of mobility vs comfort care with education on adaptive equipment with use of a lift and w/c level mobility. Patient reports concerns with her progress and states "moving is like torture." Spent increased time discussing goals of care with patient and her granddaughter and encouraged them to speak to other family members to consider the best way for the patient to be supported while she is  in CIR and for d/c planning. Patient and her granddaughter very appreciative.   PT offered sitting EOB, transferring to a w/c using a lift, lower extremity ROM and bed level exercise, soft tissue mobilization for edema and pain control. Patient declined all skilled interventions at this time due to pain and fatigue. Patient agreeable to repositioning in bed with max A +2 to scoot up in the bed. Required increased time to tolerate moving the [place] flat then back up to a sitting position. Placed B lower extremities on pillows for elevation and edema control. Patient reporting increased comfort after.  Patient in bed with granddaughter at bedside at end of session with breaks locked, bed alarm set, and all needs within reach. Patient missed 7 min of skilled PT due to pain/fatigue, RN made aware. Will attempt to make-up missed time as able.    Therapy Documentation Precautions:  Precautions Precautions: Fall Precaution Comments: Rt hemiparesis and Rt inattention Restrictions Weight Bearing Restrictions: No General: PT Amount of Missed Time (min): 7 Minutes PT Missed Treatment Reason: Pain    Therapy/Group: Individual Therapy  Danelia Snodgrass L Nkenge Sonntag PT, DPT  05/07/2021, 8:53 AM

## 2021-05-07 NOTE — Progress Notes (Signed)
Occupational Therapy Session Note  Patient Details  Name: Rita Yoder MRN: [id number] Date of Birth: July 02, 1940  Session 1 Today's Date: 05/07/2021 OT Individual Time: 0930-1000 OT Individual Time Calculation (min): 30 min    Session 2 Today's Date: 05/07/2021 OT Individual Time: 1916-6060 OT Individual Time Calculation (min): 40 min    Short Term Goals: Week 1:  OT Short Term Goal 1 (Week 1): Pt will complete BSC trasnfer with +2 assist and LRAD OT Short Term Goal 2 (Week 1): Pt will complete LB dressing at sit<stand level with LRAD OT Short Term Goal 3 (Week 1): Pt will complete 1 component of UB dressing with Max A  Skilled Therapeutic Interventions/Progress Updates:    Session 1: Pt received supine with moderate pain in her R shoulder and back pain. She was able to complete anterior peri hygiene with min A supine. Pt required mod A to roll to the R for brief change and max A for posterior peri hygiene. From sidelying, worked on motor planning to come to EOB, max A for bringing LE off EOB. Max A overall to elevate trunk. Pt sat EOB with (S) for 15 minutes. Pt completed UB bathing with max A for motor planning and initiation. Poor R attention, requiring facilitation at her head to find shirt stuck on R arm. She had very poor awareness of this R inattention. Max +2 assist required to return to supine. Pt was left supine with RN present, all needs met. Bed alarm set.    Session 2: Pt received supine with her son [name] present. SLP present to assist as well. Extra encouragement for attempting OOB mobility. Pt agreeable with her son's support/encouragement. Pt rolled R with mod A and L with max A to don pants with total A. Hoyer pad was placed and maximove used to transfer pt from supine to upright in manual w/c. Total A to position her hips back. Pt completed grooming tasks at the sink with verbal and tactile cues to find midline and then scan R to locate washcloth. She was taken out  of the room in her w/c to assess sitting tolerance in the w/c and to have a change of scenery for increasing self efficacy. Completed visual scanning task, finding her therapists pictures in pictures on the wall, requiring max multimodal cueing. She returned to her room and was left sitting up in the wc with her son present. NT notified of pt request to return to bed at 3:15.   Therapy Documentation Precautions:  Precautions Precautions: Fall Precaution Comments: Rt hemiparesis and Rt inattention Restrictions Weight Bearing Restrictions: No   Therapy/Group: Individual Therapy  Curtis Sites 05/07/2021, 6:15 AM

## 2021-05-07 NOTE — Progress Notes (Signed)
Speech Language Pathology Daily Session Note  Patient Details  Name: Rita Yoder MRN: [id number] Date of Birth: 1940-12-13  Today's Date: 05/07/2021 SLP Individual Time: 1325-1405 SLP Individual Time Calculation (min): 40 min  Short Term Goals: Week 1: SLP Short Term Goal 1 (Week 1): Patient will scan to left visual field to locate functional items in 25% of opportunities with Max A multimodal cues. SLP Short Term Goal 2 (Week 1): Patient will demonstrate orientation to month, place and situation with Max A multimodal cues. SLP Short Term Goal 3 (Week 1): Patient will initiate functional tasks in 50% of opportunities with Mod multimodal cues. SLP Short Term Goal 4 (Week 1): Patient will answer yes/no questions with 50% accuracy with Mod A multimodal cues. SLP Short Term Goal 5 (Week 1): Patient will follow basic commands in 50% of opportunities with Mod A multimodal cues. SLP Short Term Goal 6 (Week 1): Patient will name functional items/family members with 50% accuracy and Max A multimodal cues.  Skilled Therapeutic Interventions: Skilled treatment session focused on communication goals. Upon arrival, patient was supine in bed and appeared lethargic. SLP facilitated session by engaging patient in functional conversation that focused on recall of biographical information and orientation. Patient was independently oriented to place and situation but required total A for orientation to time. With Min question cues, patient able to recall pertinent biographical information regarding her family. Patient with increased deficits in word-finding with decreased utilization of cueing, suspect due to fatigue. Patient scanned to her right field of environment to locate specific items and to make eye contact with clinician with Mod verbal, visual and tactile cues. Patient left upright in bed with alarm on and all needs within reach. Continue with current plan of care.      Pain No/Denies Pain    Therapy/Group: Individual Therapy  Dontrey Snellgrove 05/07/2021, 2:59 PM

## 2021-05-07 NOTE — Progress Notes (Signed)
Patient ID: Rita Yoder, female   DOB: 03-31-1940, 80 y.o.   MRN: [id number]  SW met with pt and pt son Izell [place] to provide updates from team conference, and d/c date will be determined next week to allow her time to adjust to discomfort/pain she is experiences. Patient has been encouraged to participate in therapy as much as possible despite discomfort. Pt and son understand a d/c date will be set next week.  Loralee Pacas, MSW, [place] Office: [phone] Cell: [phone] Fax: [phone]

## 2021-05-08 LAB — BASIC METABOLIC PANEL
Anion gap: 11 (ref 5–15)
BUN: 62 mg/dL — ABNORMAL HIGH (ref 8–23)
CO2: 17 mmol/L — ABNORMAL LOW (ref 22–32)
Calcium: 9.2 mg/dL (ref 8.9–10.3)
Chloride: 112 mmol/L — ABNORMAL HIGH (ref 98–111)
Creatinine, Ser: 2.52 mg/dL — ABNORMAL HIGH (ref 0.44–1.00)
GFR, Estimated: 19 mL/min — ABNORMAL LOW (ref >60)
Glucose, Bld: 85 mg/dL (ref 70–99)
Potassium: 5.6 mmol/L — ABNORMAL HIGH (ref 3.5–5.1)
Sodium: 140 mmol/L (ref 135–145)

## 2021-05-08 MED ORDER — SODIUM ZIRCONIUM CYCLOSILICATE 10 G PO PACK
10.0000 g | PACK | Freq: Once | ORAL | Status: AC
  Administered 2021-05-08: 10 g via ORAL
  Filled 2021-05-08: qty 1

## 2021-05-08 MED ORDER — ACETAMINOPHEN 500 MG PO TABS
500.0000 mg | ORAL_TABLET | Freq: Three times a day (TID) | ORAL | Status: DC
Start: 2021-05-08 — End: 2021-05-24
  Administered 2021-05-08 – 2021-05-24 (×49): 500 mg via ORAL
  Filled 2021-05-08 (×50): qty 1

## 2021-05-08 NOTE — Progress Notes (Signed)
Occupational Therapy Session Note ? ?Patient Details  ?Name: Rita Yoder ?MRN: [id number] ?Date of Birth: 1941/03/05 ? ?Today's Date: 05/08/2021 ?OT Individual Time: 4142-3953 ?OT Individual Time Calculation (min): 55 min  ? ? ?Short Term Goals: ?Week 1:  OT Short Term Goal 1 (Week 1): Pt will complete BSC trasnfer with +2 assist and LRAD ?OT Short Term Goal 2 (Week 1): Pt will complete LB dressing at sit<stand level with LRAD ?OT Short Term Goal 3 (Week 1): Pt will complete 1 component of UB dressing with Max A ? ?Skilled Therapeutic Interventions/Progress Updates:  ?  Pt received supine with her granddaughter present, no c/o pain at rest. Overall improved motor planning and R attention this session. Rolling to the R with only min A! Mod A for L rolling but improved initiation of bed mobility techniques and reaching across midline. Mod A for brief change and hygiene at bed level with her granddaughter assisting. Pants donned total A and then hoyer pad placed. Maximove used to transfer pt to manual w/c. Total A to shift hips in the w/c. She was positioned at the sink with cueing for visual attention to midline. Pt completed UB bathing with improved thoroughness and spontaneous use of the R- min A overall. Increased time and effort to hand items to therapist on the R side. Pt completed UB dressing with mod A overall, requiring multimodal cueing for sequencing and motor planning. Pt consumed several bites of yogurt- with facilitation provided for RUE to grip yogurt and LUE loading spoon and bringing to mouth. Pt was passed off to SLP in room.  ? ?Therapy Documentation ?Precautions:  ?Precautions ?Precautions: Fall ?Precaution Comments: Rt hemiparesis and Rt inattention ?Restrictions ?Weight Bearing Restrictions: No ? ? ?Therapy/Group: Individual Therapy ? ?Curtis Sites ?05/08/2021, 6:21 AM ?

## 2021-05-08 NOTE — Progress Notes (Signed)
Occupational Therapy Session Note ? ?Patient Details  ?Name: Rita Yoder ?MRN: [id number] ?Date of Birth: Sep 28, 1940 ? ?Today's Date: 05/08/2021 ?OT Individual Time: 6811-5726 ?OT Individual Time Calculation (min): 40 min  ? ? ?Short Term Goals: ?Week 1:  OT Short Term Goal 1 (Week 1): Pt will complete BSC trasnfer with +2 assist and LRAD ?OT Short Term Goal 2 (Week 1): Pt will complete LB dressing at sit<stand level with LRAD ?OT Short Term Goal 3 (Week 1): Pt will complete 1 component of UB dressing with Max A ? ? ?Skilled Therapeutic Interventions/Progress Updates:  ?  Pt greeted at time of session bed level resting with son present who remained for session. Pt did not c/o pain. Pt initially stating she was tired but with encouragement pt willing to get OOB via maximove. Initially spoke with NT for assistance and while waiting for 2+ assist for transfer, pt rolling to the R with Mod A and L with Min/Mod A for placement of maximove pad. Pt performing AAROM for RUE bed level for shoulder flexion/extension and scaption for functional reach pattern toward ceiling for 2x10 with added resistance on second round. 2x10 flexion/extension pattern for elbow as well, second round with added resistance. Located 2+ assist at this time and performed maximove transfer to wheelchair and set up with alarm on call bell in reach and son present.  ? ? ? ?Therapy Documentation ?Precautions:  ?Precautions ?Precautions: Fall ?Precaution Comments: Rt hemiparesis and Rt inattention ?Restrictions ?Weight Bearing Restrictions: No ? ? ? ? ?Therapy/Group: Individual Therapy ? ?Viona Gilmore ?05/08/2021, 1:02 PM ?

## 2021-05-08 NOTE — Progress Notes (Signed)
?                                                       PROGRESS NOTE ? ? ?Subjective/Complaints: ?Says she had a rough night. Really doesn't specify other than feeling achy again.  ? ?ROS: Limited due to cognitive/behavioral  ? ?Objective: ?  ?No results found. ?Recent Labs  ?  05/06/21 ?0758  ?WBC 13.3*  ?HGB 9.5*  ?HCT 29.7*  ?PLT 370  ? ?Recent Labs  ?  05/06/21 ?0758 05/08/21 ?2297  ?NA 138 140  ?K 4.7 5.6*  ?CL 109 112*  ?CO2 20* 17*  ?GLUCOSE 119* 85  ?BUN 76* 62*  ?CREATININE 2.93* 2.52*  ?CALCIUM 9.0 9.2  ? ? ?Intake/Output Summary (Last 24 hours) at 05/08/2021 0815 ?Last data filed at 05/07/2021 2200 ?Gross per 24 hour  ?Intake 1384.98 ml  ?Output --  ?Net 1384.98 ml  ?  ? ?  ? ?Physical Exam: ?Vital Signs ?Blood pressure (!) 147/58, pulse (!) 51, temperature 97.6 ?F (36.4 ?C), temperature source Oral, resp. rate 17, height 5\' 6"  (1.676 m), weight 90.6 kg, SpO2 100 %. ?Constitutional: No distress . Vital signs reviewed. obese ?HEENT: NCAT, EOMI, oral membranes moist ?Neck: supple ?Cardiovascular: RRR without murmur. No JVD    ?Respiratory/Chest: CTA Bilaterally without wheezes or rales. Normal effort    ?GI/Abdomen: BS +, non-tender, non-distended ?Ext: no clubbing, cyanosis, or edema ?Psych: pleasant and cooperative  ?Musculoskeletal:  ?   Cervical back: left scapular area tender to palp, +/- rhomboids. Arm itself did not appear tender.  Does seem to have some generalized tenderness ?Neuro:  ?Alert and Oriented to person, place, stroke. Right central 7, reasonable tongue control. Left gaze preference but seems much more attentive to right today.  RUE 4- to 5/5. RLE 3-4/5 LUE/LLE 4+/5. Decreased light touch on right. A little quicker with right side today.  Decreased LT, hypersensitivity in stocking glove distribution below knees bilaterally. DTR's 1+ ?Skin: ?   General: Skin is warm and dry.  ?  ?Assessment/Plan: ?1. Functional deficits which require 3+ hours per day of interdisciplinary therapy in a  comprehensive inpatient rehab setting. ?Physiatrist is providing close team supervision and 24 hour management of active medical problems listed below. ?Physiatrist and rehab team continue to assess barriers to discharge/monitor patient progress toward functional and medical goals ? ?Care Tool: ? ?Bathing ?   ?   ? Body parts bathed by helper: Right arm, Left arm, Chest, Abdomen, Front perineal area, Buttocks, Right upper leg, Left upper leg, Left lower leg, Right lower leg, Face ?  ?  ?Bathing assist Assist Level: 2 Helpers ?  ?  ?Upper Body Dressing/Undressing ?Upper body dressing   ?What is the patient wearing?: [place] only ?   ?Upper body assist Assist Level: 2 Helpers ?   ?Lower Body Dressing/Undressing ?Lower body dressing ? ? ?   ?What is the patient wearing?: Incontinence brief ? ?  ? ?Lower body assist Assist for lower body dressing: 2 Helpers ?   ? ?Toileting ?Toileting    ?Toileting assist Assist for toileting: 2 Helpers ?  ?  ?Transfers ?Chair/bed transfer ? ?Transfers assist ? Chair/bed transfer activity did not occur: Safety/medical concerns ? ?  ?  ?  ?Locomotion ?Ambulation ? ? ?Ambulation assist ? ? Ambulation activity did not  occur: Safety/medical concerns ? ?  ?  ?   ? ?Walk 10 feet activity ? ? ?Assist ? Walk 10 feet activity did not occur: Safety/medical concerns ? ?  ?   ? ?Walk 50 feet activity ? ? ?Assist Walk 50 feet with 2 turns activity did not occur: Safety/medical concerns ? ?  ?   ? ? ?Walk 150 feet activity ? ? ?Assist Walk 150 feet activity did not occur: Safety/medical concerns ? ?  ?  ?  ? ?Walk 10 feet on uneven surface  ?activity ? ? ?Assist Walk 10 feet on uneven surfaces activity did not occur: Safety/medical concerns ? ? ?  ?   ? ?Wheelchair ? ? ? ? ?Assist Is the patient using a wheelchair?: Yes ?Type of Wheelchair: Manual ?Wheelchair activity did not occur: Safety/medical concerns ? ?  ?   ? ? ?Wheelchair 50 feet with 2 turns activity ? ? ? ?Assist ? ?  ?Wheelchair 50  feet with 2 turns activity did not occur: Safety/medical concerns ? ? ?   ? ?Wheelchair 150 feet activity  ? ? ? ?Assist ? Wheelchair 150 feet activity did not occur: Safety/medical concerns ? ? ?   ? ?Blood pressure (!) 147/58, pulse (!) 51, temperature 97.6 ?F (36.4 ?C), temperature source Oral, resp. rate 17, height 5\' 6"  (1.676 m), weight 90.6 kg, SpO2 100 %. ? ?Medical Problem List and Plan: ?1. Functional deficits secondary to acute multifocal infarct including left ischemic thalamus, left parietal cortical and subcortical infarcts ?            -Continue CIR therapies including PT, OT, and SLP    ? -discussed participation and effort needed to work through deficits ?2.  Antithrombotics: ?-DVT/anticoagulation:  Pharmaceutical: Other (comment) Eliquis ?            -antiplatelet therapy: N/A ? -pt with left leg swelling but already on eliquis. Really no need to check dopplers at this point. Discussed with pt/daughter ?3. Back pain/left shoulder girdle pain: continue kpad ? 3/1-add muscle rub for shoulder girdle, may be from overuse/technique with transfers and mobility ?4. Mood: Provide emotional support ?            -antipsychotic agents: N/A ?5. Neuropsych: This patient is not capable of making decisions on her own behalf. ?6. Skin/Wound Care: Routine skin checks ?7. Fluids/Electrolytes/Nutrition: Routine in and outs ?8.  New onset atrial fibrillation with RVR.  Amiodarone 100 mg daily.  Follow-up cardiology services.  Cardiac rate presently controlled ?   ?9.  Hypertension.  Norvasc 5 mg daily, Imdur 30 mg daily.  Monitor with increased mobility ? Borderline control 2/28 ?10.  Suspicion for focal seizure.  Continue Keppra 500 mg twice daily.  EEG not completed due to glued wig could not be removed. ?11.  AKI on CKD stage IIIB- GFR 20.  Creatinine baseline 2.8.  HCTZ on hold. ? 3/1 BUN/Cr improving  ?  - continue IVF at HS only ?  -encourage PO's ?  -lokelma x1 for hyperkalemia ?  -recheck bmet 3/2 ?12.   Hyperlipidemia.  Continue Lipitor ?13.  Hypothyroidism.  Synthroid ?14.  Normocytic anemia/monoclonal gammopathy.  Follow-up hematology services Dr.Kale ?15.  Constipation.  MiraLAX twice daily.  Senokot daily. ?16.  Suspect UTI.  Urinalysis negative nitrite.  Empiric Keflex. ?17.Type 2 diabetes mellitus.HG  AIC 4.5   SSI discontinued ? ?  Blood glucose of 226 on 2/25. Check periodically ?18. Transaminitis: tylenol d/ced ?19. Leukocytosis:    ?  -  persistent elevation. No signs of infection ? -continue to monitor ?LOS: ?6 days ?A FACE TO FACE EVALUATION WAS PERFORMED ? ?Meredith Staggers ?05/08/2021, 8:15 AM  ? ?  ?

## 2021-05-08 NOTE — Progress Notes (Signed)
Speech Language Pathology Daily Session Note ? ?Patient Details  ?Name: Rita Yoder ?MRN: [id number] ?Date of Birth: Aug 15, 1940 ? ?Today's Date: 05/08/2021 ?SLP Individual Time: 0930-1010 ?SLP Individual Time Calculation (min): 40 min ? ?Short Term Goals: ?Week 1: SLP Short Term Goal 1 (Week 1): Patient will scan to right visual field to locate functional items in 25% of opportunities with Max A multimodal cues. ?SLP Short Term Goal 2 (Week 1): Patient will demonstrate orientation to month, place and situation with Max A multimodal cues. ?SLP Short Term Goal 3 (Week 1): Patient will initiate functional tasks in 50% of opportunities with Mod multimodal cues. ?SLP Short Term Goal 4 (Week 1): Patient will answer yes/no questions with 50% accuracy with Mod A multimodal cues. ?SLP Short Term Goal 5 (Week 1): Patient will follow basic commands in 50% of opportunities with Mod A multimodal cues. ?SLP Short Term Goal 6 (Week 1): Patient will name functional items/family members with 50% accuracy and Max A multimodal cues. ? ?Skilled Therapeutic Interventions: Skilled treatment session focused on cognitive goals. Upon arrival, patient was awake while upright in the wheelchair. SLP facilitated session by providing Max A multimodal cues for patient to scan to midline to locate a specific number from a field of 2. Patient with 50% accuracy. SLP also facilitated session by providing Max A multimodal cues for patient to identify colors from a field of 2 at midline and then place the item in a bin on her right side. Patient completed this task with extra time and Max A multimodal cues. Mod verbal cues were needed for sustained attention to task for ~25 minutes. Patient transferred back to bed via the [facility]. Patient left upright in bed with alarm on and all needs within reach. Continue with current plan of care.  ?   ? ?Pain ?No/Denies Pain  ? ?Therapy/Group: Individual Therapy ? ?Lavonya Hoerner ?05/08/2021, 12:50 PM ?

## 2021-05-08 NOTE — Progress Notes (Signed)
Physical Therapy Session Note ? ?Patient Details  ?Name: Rita Yoder ?MRN: [id number] ?Date of Birth: 1940-06-18 ? ?Today's Date: 05/08/2021 ?PT Individual Time: 1405-1500 ?PT Individual Time Calculation (min): 55 min  ? ?Short Term Goals: ?Week 1:  PT Short Term Goal 1 (Week 1): Pt will roll side to side w/ mod A ?PT Short Term Goal 2 (Week 1): Pt will transfer sit to stand w/ mod A ?PT Short Term Goal 3 (Week 1): Pt will transfer sit to stand w/ mod A ?PT Short Term Goal 4 (Week 1): Pt will tolerate standing x 3' performing UE activity. ? ?Skilled Therapeutic Interventions/Progress Updates:  ?   ?Patient in w/c with her son in the room upon PT arrival. Patient alert and agreeable to PT session. Patient denied pain during session. ? ?Therapeutic Activity: ?Bed Mobility: Patient performed sit to supine with max A for trunk and lower extremity management. Provided verbal cues for use of her elbow to lower to side-lying and bringing knees to chest to lift his legs onto the bed. ?Patient performed scooting up in bed x2 with bed in trendelenburg with mod A with patient pushing through B lower extremities.  ?Transfers: Patient performed sit to/from stand x2 in the // bars with min A using B upper extremities with hand over hand assist for R hand placement. Provided verbal cues for forward weight shift and R hip/knee/trunk extension. She stood 2x30-45 sec each trial with min A for trunk support before sitting due to fatigue. ?Patient performed stand pivot w/c>bed with mod-max A with B upper extremities on therapist's shoulders. ? ?Patient reported discomfort in sitting in manual w/c x45 min prior to session. Retrieved 22"x18" TIS w/c with hybrid foam and Roho cushion for improved sitting tolerance OOB.  ? ?Patient in bed at end of session with breaks locked, bed alarm set, and all needs within reach.  ? ?Therapy Documentation ?Precautions:  ?Precautions ?Precautions: Fall ?Precaution Comments: Rt hemiparesis and Rt  inattention ?Restrictions ?Weight Bearing Restrictions: No ? ? ? ?Therapy/Group: Individual Therapy ? ?Doreene Burke PT, DPT ? ?05/08/2021, 8:12 PM  ?

## 2021-05-09 ENCOUNTER — Telehealth: Payer: Self-pay

## 2021-05-09 LAB — BASIC METABOLIC PANEL
Anion gap: 9 (ref 5–15)
BUN: 59 mg/dL — ABNORMAL HIGH (ref 8–23)
CO2: 18 mmol/L — ABNORMAL LOW (ref 22–32)
Calcium: 8.8 mg/dL — ABNORMAL LOW (ref 8.9–10.3)
Chloride: 111 mmol/L (ref 98–111)
Creatinine, Ser: 2.47 mg/dL — ABNORMAL HIGH (ref 0.44–1.00)
GFR, Estimated: 19 mL/min — ABNORMAL LOW (ref >60)
Glucose, Bld: 82 mg/dL (ref 70–99)
Potassium: 4 mmol/L (ref 3.5–5.1)
Sodium: 138 mmol/L (ref 135–145)

## 2021-05-09 NOTE — Progress Notes (Signed)
Speech Language Pathology Daily Session Note ? ?Patient Details  ?Name: Simona Rocque ?MRN: [id number] ?Date of Birth: 1940-09-27 ? ?Today's Date: 05/09/2021 ?SLP Individual Time: 7943-2761 ?SLP Individual Time Calculation (min): 40 min ? ?Short Term Goals: ?Week 1: SLP Short Term Goal 1 (Week 1): Patient will scan to right visual field to locate functional items in 25% of opportunities with Max A multimodal cues. ?SLP Short Term Goal 2 (Week 1): Patient will demonstrate orientation to month, place and situation with Max A multimodal cues. ?SLP Short Term Goal 3 (Week 1): Patient will initiate functional tasks in 50% of opportunities with Mod multimodal cues. ?SLP Short Term Goal 4 (Week 1): Patient will answer yes/no questions with 50% accuracy with Mod A multimodal cues. ?SLP Short Term Goal 5 (Week 1): Patient will follow basic commands in 50% of opportunities with Mod A multimodal cues. ?SLP Short Term Goal 6 (Week 1): Patient will name functional items/family members with 50% accuracy and Max A multimodal cues. ? ?Skilled Therapeutic Interventions: Skilled treatment session focused on cognitive-linguistic goals. SLP facilitated session by providing Mod-Max A verbal and visual cues for patient to scan to her right field of environment to locate family pictures and identify family members. Patient with intermittent groping for words and required overall Mod A verbal cues for word-finding at the phrase level. Patient identified family members appropriately with ~75% accuracy. Patient left upright in bed with alarm on and all needs within reach. Continue with current plan of care.  ?   ? ?Pain ?No/Denies Pain  ? ?Therapy/Group: Individual Therapy ? ?Kaevion Sinclair ?05/09/2021, 3:40 PM ?

## 2021-05-09 NOTE — Progress Notes (Signed)
?                                                       PROGRESS NOTE ? ? ?Subjective/Complaints: ?Pt reports that she had a better night. Left shoulder/neck feel better today. Says muscle rub helped ? ?ROS: Patient denies fever, rash, sore throat, blurred vision, dizziness, nausea, vomiting, diarrhea, cough, shortness of breath or chest pain   ? ?Objective: ?  ?No results found. ?No results for input(s): WBC, HGB, HCT, PLT in the last 72 hours. ? ?Recent Labs  ?  05/08/21 ?9381 05/09/21 ?8299  ?NA 140 138  ?K 5.6* 4.0  ?CL 112* 111  ?CO2 17* 18*  ?GLUCOSE 85 82  ?BUN 62* 59*  ?CREATININE 2.52* 2.47*  ?CALCIUM 9.2 8.8*  ? ? ?Intake/Output Summary (Last 24 hours) at 05/09/2021 1020 ?Last data filed at 05/08/2021 1300 ?Gross per 24 hour  ?Intake 120 ml  ?Output --  ?Net 120 ml  ?  ? ?  ? ?Physical Exam: ?Vital Signs ?Blood pressure (!) 159/57, pulse (!) 49, temperature 97.6 ?F (36.4 ?C), temperature source Oral, resp. rate 18, height 5\' 6"  (1.676 m), weight 90.6 kg, SpO2 100 %. ?Constitutional: No distress . Vital signs reviewed. obese ?HEENT: NCAT, EOMI, oral membranes moist ?Neck: supple ?Cardiovascular: RRR without murmur. No JVD    ?Respiratory/Chest: CTA Bilaterally without wheezes or rales. Normal effort    ?GI/Abdomen: BS +, non-tender, non-distended ?Ext: no clubbing, cyanosis, or edema ?Psych: pleasant and cooperative  ?Musculoskeletal:  ?   Cervical back: left scapular area tender to palp, +/- rhomboids. Arm itself did not appear tender.  Does seem to have some generalized tenderness ?Neuro:  ?Alert and Oriented to person, place, stroke. Right central 7, reasonable tongue control. Improving attention to right.  RUE 4- to 5/5. RLE 3-4/5 LUE/LLE 4+/5. Decreased light touch on right. Engages more quickly with right arm and elg.  Decreased LT, hypersensitivity in stocking glove distribution below knees bilaterally. DTR's 1+ ?Skin: ?   General: Skin is warm and dry.  ?  ?Assessment/Plan: ?1. Functional deficits  which require 3+ hours per day of interdisciplinary therapy in a comprehensive inpatient rehab setting. ?Physiatrist is providing close team supervision and 24 hour management of active medical problems listed below. ?Physiatrist and rehab team continue to assess barriers to discharge/monitor patient progress toward functional and medical goals ? ?Care Tool: ? ?Bathing ?   ?   ? Body parts bathed by helper: Right arm, Left arm, Chest, Abdomen, Front perineal area, Buttocks, Right upper leg, Left upper leg, Left lower leg, Right lower leg, Face ?  ?  ?Bathing assist Assist Level: 2 Helpers ?  ?  ?Upper Body Dressing/Undressing ?Upper body dressing   ?What is the patient wearing?: [place] only ?   ?Upper body assist Assist Level: 2 Helpers ?   ?Lower Body Dressing/Undressing ?Lower body dressing ? ? ?   ?What is the patient wearing?: Incontinence brief ? ?  ? ?Lower body assist Assist for lower body dressing: 2 Helpers ?   ? ?Toileting ?Toileting    ?Toileting assist Assist for toileting: 2 Helpers ?  ?  ?Transfers ?Chair/bed transfer ? ?Transfers assist ? Chair/bed transfer activity did not occur: Safety/medical concerns ? ?  ?  ?  ?Locomotion ?Ambulation ? ? ?Ambulation assist ? ?  Ambulation activity did not occur: Safety/medical concerns ? ?  ?  ?   ? ?Walk 10 feet activity ? ? ?Assist ? Walk 10 feet activity did not occur: Safety/medical concerns ? ?  ?   ? ?Walk 50 feet activity ? ? ?Assist Walk 50 feet with 2 turns activity did not occur: Safety/medical concerns ? ?  ?   ? ? ?Walk 150 feet activity ? ? ?Assist Walk 150 feet activity did not occur: Safety/medical concerns ? ?  ?  ?  ? ?Walk 10 feet on uneven surface  ?activity ? ? ?Assist Walk 10 feet on uneven surfaces activity did not occur: Safety/medical concerns ? ? ?  ?   ? ?Wheelchair ? ? ? ? ?Assist Is the patient using a wheelchair?: Yes ?Type of Wheelchair: Manual ?Wheelchair activity did not occur: Safety/medical concerns ? ?  ?   ? ? ?Wheelchair  50 feet with 2 turns activity ? ? ? ?Assist ? ?  ?Wheelchair 50 feet with 2 turns activity did not occur: Safety/medical concerns ? ? ?   ? ?Wheelchair 150 feet activity  ? ? ? ?Assist ? Wheelchair 150 feet activity did not occur: Safety/medical concerns ? ? ?   ? ?Blood pressure (!) 159/57, pulse (!) 49, temperature 97.6 ?F (36.4 ?C), temperature source Oral, resp. rate 18, height 5\' 6"  (1.676 m), weight 90.6 kg, SpO2 100 %. ? ?Medical Problem List and Plan: ?1. Functional deficits secondary to acute multifocal infarct including left ischemic thalamus, left parietal cortical and subcortical infarcts ?            -Continue CIR therapies including PT, OT, and SLP    ?  -still fatigues easily, pain barriers too ?2.  Antithrombotics: ?-DVT/anticoagulation:  Pharmaceutical: Other (comment) Eliquis ?            -antiplatelet therapy: N/A ?3. Back pain/left shoulder girdle pain: continue kpad ? -muscle rub for shoulder girdle seems to be helping. likely from overuse/technique with transfers and mobility ?4. Mood: Provide emotional support ?            -antipsychotic agents: N/A ?5. Neuropsych: This patient is not capable of making decisions on her own behalf. ?6. Skin/Wound Care: Routine skin checks ?7. Fluids/Electrolytes/Nutrition: Routine in and outs ?8.  New onset atrial fibrillation with RVR.  Amiodarone 100 mg daily.  Follow-up cardiology services.  Cardiac rate presently controlled ?   ?9.  Hypertension.  Norvasc 5 mg daily, Imdur 30 mg daily.  Monitor with increased mobility ? Borderline control 3/2 ?10.  Suspicion for focal seizure.  Continue Keppra 500 mg twice daily.  EEG not completed due to glued wig could not be removed. ?11.  AKI on CKD stage IIIB- GFR 20.  Marland Kitchen  HCTZ on hold. ? 3/1 BUN/Cr are gradually improving--probably close to baseline now ?  -will dc HS IVF ?  -encourage PO's ?  -lokelma x1 for hyperkalemia with improvement to 4.0 today ?  -recheck bmet on Monday ?12.  Hyperlipidemia.  Continue  Lipitor ?13.  Hypothyroidism.  Synthroid ?14.  Normocytic anemia/monoclonal gammopathy.  Follow-up hematology services Dr.Kale ?15.  Constipation.  MiraLAX twice daily.  Senokot daily. ?16.  Suspect UTI.  Urinalysis negative nitrite.  Empiric Keflex. ?17.Type 2 diabetes mellitus.HG  AIC 4.5   SSI discontinued ? ?  Blood glucose of 226 on 2/25. Check periodically ?18. Transaminitis: tylenol d/ced ?19. Leukocytosis:    ?  -persistent elevation. Afebrile, no signs of infection ? -continue to  monitor ? ? ?LOS: ?7 days ?A FACE TO FACE EVALUATION WAS PERFORMED ? ?Meredith Staggers ?05/09/2021, 10:20 AM  ? ?   ?

## 2021-05-09 NOTE — Telephone Encounter (Signed)
Patient' son has been notified that their forms have been completed and faxed. I have placed them up front for him to pick up. YL,RMA ?

## 2021-05-09 NOTE — Progress Notes (Signed)
Physical Therapy Session Note ? ?Patient Details  ?Name: Rita Yoder ?MRN: [id number] ?Date of Birth: August 27, 1940 ? ?Today's Date: 05/09/2021 ?PT Individual Time: 8309-4076 ?PT Individual Time Calculation (min): 75 min  ? ?Short Term Goals: ?Week 1:  PT Short Term Goal 1 (Week 1): Pt will roll side to side w/ mod A ?PT Short Term Goal 2 (Week 1): Pt will transfer sit to stand w/ mod A ?PT Short Term Goal 3 (Week 1): Pt will transfer sit to stand w/ mod A ?PT Short Term Goal 4 (Week 1): Pt will tolerate standing x 3' performing UE activity. ? ?Skilled Therapeutic Interventions/Progress Updates:  ?   ?Patient in bed with her son in the room upon PT arrival. Patient alert and agreeable to PT session. Patient denied pain during session. ? ?Discussed plan for working on standing transfers and initiating gait training during session. Patient in agreement and motivated to try to take steps in the // bars. Patient performed supine to sit with min A in a flat bed with use of bed rails. Provided cues for transitioning through L side lying and pushing to sit up to her elbow then hand. Patient initiated a stand pivot transfer to the TIS w/c with min-mod A holding therapist's shoulders, during transfer patient noted to be incontinent of bowl and returned to bed, as appropriate BSC not in room at this time. Patient performed sit to supine with mod A and poor trunk control when returning to supine.  ? ?Focused remainder of session on R upper and lower extremity motor control and attention during rolling R/L x4 with min A-CGA with use of bed rails and scooting up in the bed x2 in hook-lying with mod A with the bed in trendelenburg. Patient incontinent of large, loose BM, performed peri-care and lower body clothing management with total A. Applied anti-fungal powder under skin folds to prevent skin breakdown from moisture.  ? ?Applied/removed B lower extremity ACE wraps, 1x4" and 1x6" bilaterally, at beginning and end of session.  Patient performed SLR x8-10 during wrapping and removal, able to perform AROM for SLR on R today with improved consistency of using her R leg on command.  ? ?Patient fatigued following BM. PT attempted to change the back of a TIS w/c for improved sitting posture/tolerance, however equipment unable to be adjusted appropriately to patient due to her shorter stature. Educated patient on benefits of sitting OOB and adjustments to improve sitting tolerance during w/c adjustments.  ? ?Patient in bed with her son in the room at end of session with breaks locked, bed alarm set, and all needs within reach.  ? ?Therapy Documentation ?Precautions:  ?Precautions ?Precautions: Fall ?Precaution Comments: Rt hemiparesis and Rt inattention ?Restrictions ?Weight Bearing Restrictions: No ? ? ?Therapy/Group: Individual Therapy ? ?Doreene Burke PT, DPT ? ?05/09/2021, 4:47 PM  ?

## 2021-05-09 NOTE — Progress Notes (Signed)
Occupational Therapy Session Note ? ?Patient Details  ?Name: Rita Yoder ?MRN: [id number] ?Date of Birth: 02/17/1941 ? ?Today's Date: 05/09/2021 ?OT Individual Time: 0700-0800 ?OT Individual Time Calculation (min): 60 min  ? ? ?Short Term Goals: ?Week 1:  OT Short Term Goal 1 (Week 1): Pt will complete BSC trasnfer with +2 assist and LRAD ?OT Short Term Goal 2 (Week 1): Pt will complete LB dressing at sit<stand level with LRAD ?OT Short Term Goal 3 (Week 1): Pt will complete 1 component of UB dressing with Max A ? ?Skilled Therapeutic Interventions/Progress Updates:  ?   ?Pt received in bed with no pain reported but mild dizziness 141/71 for BP no teds sitting up EOB. ? ?ADL: ?Pt completes rolling in bed multiple rounds with MIN A decreasing to supervision with repetition as well as tactile cuing for R attention and visual fixation on bed rails for target to assist with rolling ot don pants total A. Hemi dressing techniques provided with education for reasoning. Pt sup>sit with MAX A overall with increased time to initiate hooklying, rolling and pushing up to EOB. Sitting balance initially MIN A decreasing to supervision A after leaning L elbow onto OT lap to find midline. Pt requires MAX A to don shirt d/t R neglect/apraxia but able to bathe UB with MIN HOH facilitation of RUE. Scooting to [facility] with MAX improving to MOD A +2 after anterior weight shift prep 5x.increasing time for initiation as well as tactile cuing for attention to LE management prior to scooting.  ? ?Pt left at end of session in bed with exit alarm on, call light in reach and all needs met ? ? ?Therapy Documentation ?Precautions:  ?Precautions ?Precautions: Fall ?Precaution Comments: Rt hemiparesis and Rt inattention ?Restrictions ?Weight Bearing Restrictions: No ?General: ?  ?Vital Signs: ?Therapy Vitals ?Temp: 97.6 ?F (36.4 ?C) ?Temp Source: Oral ?Pulse Rate: (!) 49 ?Resp: 18 ?BP: (!) 159/57 ?Patient Position (if appropriate): Lying ?Oxygen  Therapy ?SpO2: 100 % ?O2 Device: Room Air ?Pain: ?  ? ? ? ?Therapy/Group: Individual Therapy ? ?Lowella Dell Wallace Cogliano ?05/09/2021, 6:47 AM ?

## 2021-05-09 NOTE — Progress Notes (Signed)
Occupational Therapy Session Note ? ?Patient Details  ?Name: Rita Yoder ?MRN: [id number] ?Date of Birth: 28-Jul-1940 ? ?Today's Date: 05/10/2021 ?OT Individual Time: 8184-0375 ?OT Individual Time Calculation (min): 55 min  ?20 minutes missed ? ? ?Skilled Therapeutic Interventions/Progress Updates:  ?  Pt greeted in bed, reporting the need to urgently use the restroom. While we were setting up transfer using Jeralene Huff pt reported that she had finished voiding. Still willing to transfer to the toilet to see if she could void more. CGA for supine<sit using the bedrail, HOB slightly elevated. CGA of 1 for sit<stand in Stedy from elevated bed height. Pt transferred to the elevated toilet where she resumed with bowel void. Note that pts brief/pants were soiled with loose stools. Worked on following 1-2 step instruction while pt engaged in Nurse, mental health tasks, worked on Firefighter and standing endurance during extensive pericare. Increased time required for hygiene due to body habitus and belly folds. Pt completed frontal hygiene with Min A overall while semi perched on Stedy paddles, pt afterwards tossing soiled wash cloth onto a towel on the floor (with cuing). After clean brief was donned, pt transferred to EOB using Stedy. OT rewrapped ACE wraps, pt able to "kick" each LE out so that therapist could meet task demands. Pt also able to kick in the same way so that we could don clean pants. Pt cued to pull up pants and she initiated standing from EOB, +2 assist via 3 muskateers for pulling up pants. Pt then reported feeling very fatigued. She returned to bed with +2 assist and was boosted up. Invited pt to engage in bedlevel exercises but she reported that she was too fatigued to do so. Wanted to rest. 20 minutes missed. She remained in bed with all needs within reach and bed alarm set.  ? ?Therapy Documentation ?Precautions:  ?Precautions ?Precautions: Fall ?Precaution Comments: Rt hemiparesis and Rt  inattention ?Restrictions ?Weight Bearing Restrictions: No ?Vital Signs: ?Therapy Vitals ?Temp: 97.7 ?F (36.5 ?C) ?Temp Source: Oral ?Pulse Rate: (!) 59 ?Resp: 18 ?BP: (!) 177/62 ?Patient Position (if appropriate): Lying ?Oxygen Therapy ?SpO2: 98 % ?O2 Device: Room Air ?Pain: in the Lt hip, set pt up with k pad at end of tx ?  ?ADL: ?ADL ?Eating: Supervision/safety ?Grooming: Maximal assistance ?Where Assessed-Grooming: Edge of bed ?Upper Body Bathing: Dependent ?Where Assessed-Upper Body Bathing: Edge of bed ?Lower Body Bathing: Dependent ?Where Assessed-Lower Body Bathing: Bed level ?Upper Body Dressing: Dependent ?Lower Body Dressing: Dependent ?Where Assessed-Lower Body Dressing: Bed level ?Toileting: Dependent ?Where Assessed-Toileting: Bed level ?Toilet Transfer: Not assessed ?Tub/Shower Transfer: Not assessed ?ADL Comments: Pt dependent +2 for LB self care and toileting ? ?Therapy/Group: Individual Therapy ? ?Donovan Persley A Beanca Kiester ?05/10/2021, 3:30 PM ?

## 2021-05-10 MED ORDER — ISOSORBIDE MONONITRATE ER 30 MG PO TB24
60.0000 mg | ORAL_TABLET | Freq: Every day | ORAL | Status: DC
Start: 2021-05-11 — End: 2021-05-24
  Administered 2021-05-11 – 2021-05-24 (×14): 60 mg via ORAL
  Filled 2021-05-10 (×14): qty 2

## 2021-05-10 NOTE — Progress Notes (Signed)
Occupational Therapy Weekly Progress Note ? ?Patient Details  ?Name: Rita Yoder ?MRN: [id number] ?Date of Birth: May 18, 1940 ? ?Beginning of progress report period: May 02, 2021 ?End of progress report period: May 10, 2021 ? ?Today's Date: 05/10/2021 ?OT Individual Time: 5056-9794 ?OT Individual Time Calculation (min): 45 min  ? ? ?Patient has met 2 of 3 short term goals.  PT has demoed good progress this reporting period with improved R attention, activation and initiation. Pt continues to require significant time to process and attend to R body/space, but improves with functional incorpoation of RUE into familiar tasks. Bed mobility has improved to supervision (!) and pt has initiated sit to stand training in prep for functional transfer.  ? ?Patient continues to demonstrate the following deficits: muscle weakness, decreased cardiorespiratoy endurance, impaired timing and sequencing, unbalanced muscle activation, motor apraxia, decreased coordination, and decreased motor planning, right side neglect and decreased motor planning, decreased initiation, decreased attention, decreased awareness, decreased problem solving, decreased safety awareness, decreased memory, and delayed processing, and decreased sitting balance, decreased standing balance, decreased postural control, hemiplegia, and decreased balance strategies and therefore will continue to benefit from skilled OT intervention to enhance overall performance with BADL, iADL, and Reduce care partner burden. ? ?Patient progressing toward long term goals..  Continue plan of care. ? ?OT Short Term Goals ?Week 1:  OT Short Term Goal 1 (Week 1): Pt will complete BSC trasnfer with +2 assist and LRAD ?OT Short Term Goal 1 - Progress (Week 1): Met ?OT Short Term Goal 2 (Week 1): Pt will complete LB dressing at sit<stand level with LRAD ?OT Short Term Goal 2 - Progress (Week 1): Progressing toward goal ?OT Short Term Goal 3 (Week 1): Pt will complete 1  component of UB dressing with Max A ?OT Short Term Goal 3 - Progress (Week 1): Met ?Week 2:  OT Short Term Goal 1 (Week 2): Pt will transfer to [facility] wiht +1 A and LRAD to decrease BOC ?OT Short Term Goal 2 (Week 2): Pt will complete 1/3 components of toileting with MIN A for balance ?OT Short Term Goal 3 (Week 2): pt will thread 1LE into pants with no  more than CGA for sitting balance ? ?Skilled Therapeutic Interventions/Progress Updates:  ?  1:1. Pt received in bed with focus on functional mobility, toileting and rolling with repetition for NMR. Pt completes supine>sit 2x with MOD A Overall for LE management, sit to stand with CGA-MIN A for power up from EOB with RW and son steadying walker for pt security with facilitation of forward weight shift and no R knee buckling. Pt completes stedy transfer to commode with CGA and total A for toielting components. Pt son very excited she was able to void on the commode over toilet. Exited session with pt seated in bed, exit alarm on and call light in reach ? ? ?Therapy Documentation ?Precautions:  ?Precautions ?Precautions: Fall ?Precaution Comments: Rt hemiparesis and Rt inattention ?Restrictions ?Weight Bearing Restrictions: No ? ? ?Therapy/Group: Individual Therapy ? ?Lowella Dell Kahleah Crass ?05/10/2021, 6:53 AM  ?

## 2021-05-10 NOTE — Progress Notes (Signed)
Speech Language Pathology Weekly Progress Note ? ?Patient Details  ?Name: Rita Yoder ?MRN: [id number] ?Date of Birth: 05-Jul-1940 ? ?Beginning of progress report period: May 02, 2021 ?End of progress report period: May 10, 2021 ? ?Short Term Goals: ?Week 1: SLP Short Term Goal 1 (Week 1): Patient will scan to left visual field to locate functional items in 25% of opportunities with Max A multimodal cues. ?SLP Short Term Goal 1 - Progress (Week 1): Met ?SLP Short Term Goal 2 (Week 1): Patient will demonstrate orientation to month, place and situation with Max A multimodal cues. ?SLP Short Term Goal 2 - Progress (Week 1): Met ?SLP Short Term Goal 3 (Week 1): Patient will initiate functional tasks in 50% of opportunities with Mod multimodal cues. ?SLP Short Term Goal 3 - Progress (Week 1): Met ?SLP Short Term Goal 4 (Week 1): Patient will answer yes/no questions with 50% accuracy with Mod A multimodal cues. ?SLP Short Term Goal 4 - Progress (Week 1): Met ?SLP Short Term Goal 5 (Week 1): Patient will follow basic commands in 50% of opportunities with Mod A multimodal cues. ?SLP Short Term Goal 5 - Progress (Week 1): Met ?SLP Short Term Goal 6 (Week 1): Patient will name functional items/family members with 50% accuracy and Max A multimodal cues. ?SLP Short Term Goal 6 - Progress (Week 1): Met ? ?  ?New Short Term Goals: ?Week 2: SLP Short Term Goal 1 (Week 2): Patient will scan to right visual field during functional tasks in 50% of opportunities with Mod A multimodal cues. ?SLP Short Term Goal 2 (Week 2): Patient will demonstrate orientation to month, place and situation with Mod A multimodal cues. ?SLP Short Term Goal 3 (Week 2): Patient will initiate functional tasks in 75% of opportunities with Min multimodal cues. ?SLP Short Term Goal 4 (Week 2): Patient will utilize word-finding strategies at the phrase level with Mod A multimodal cues. ?SLP Short Term Goal 5 (Week 2): Patient will express basic  wants/needs at the sentence level with Mod A multimodal cues. ? ?Weekly Progress Updates: Patient has made functional gains and has met 6 of 6 STGs this reporting period. Currently, patient demonstrates improved arousal with improved initiation of functional tasks. Patient is consistently oriented to place and situation but requires Max verbal cues for orientation to time with use of external aids. Patient demonstrates improved scanning to midline with overall Max A multimodal cues needed to scan to right field of environment during structured tasks. Patient's overall functional communication has also improved with improved ability to follow commands. Patient can express her wants/needs at the phrase level with overall Mod multimodal cues with extra time and Mod-Max verbal cues needed for word-finding with intermittent groping noted. Patient and family education ongoing. Patient would benefit from continued skilled SLP intervention to maximize her cognitive-linguistic functioning prior to discharge.  ?  ? ? ?Intensity: Minumum of 1-2 x/day, 30 to 90 minutes ?Frequency: 3 to 5 out of 7 days ?Duration/Length of Stay: 3 weeks ?Treatment/Interventions: Cognitive remediation/compensation;Internal/external aids;Speech/Language facilitation;Therapeutic Activities;Environmental controls;Cueing hierarchy;Functional tasks;Patient/family education ? ? ? ?Rita Yoder ?05/10/2021, 6:21 AM ? ? ? ? ? ? ?

## 2021-05-10 NOTE — Progress Notes (Signed)
?                                                       PROGRESS NOTE ? ? ?Subjective/Complaints: ?Has a headache this morning. RN gave her tylenol. Seems to be in reasonable spirits. Left shoulder better ? ?ROS: Limited due to cognitive/behavioral   ? ?Objective: ?  ?No results found. ?No results for input(s): WBC, HGB, HCT, PLT in the last 72 hours. ? ?Recent Labs  ?  05/08/21 ?4818 05/09/21 ?5631  ?NA 140 138  ?K 5.6* 4.0  ?CL 112* 111  ?CO2 17* 18*  ?GLUCOSE 85 82  ?BUN 62* 59*  ?CREATININE 2.52* 2.47*  ?CALCIUM 9.2 8.8*  ? ? ?Intake/Output Summary (Last 24 hours) at 05/10/2021 0915 ?Last data filed at 05/09/2021 1903 ?Gross per 24 hour  ?Intake 476 ml  ?Output --  ?Net 476 ml  ?  ? ?  ? ?Physical Exam: ?Vital Signs ?Blood pressure (!) 171/57, pulse (!) 55, temperature 98 ?F (36.7 ?C), resp. rate 16, height 5\' 6"  (1.676 m), weight 96.7 kg, SpO2 100 %. ?Constitutional: No distress . Vital signs reviewed. ?HEENT: NCAT, EOMI, oral membranes moist ?Neck: supple ?Cardiovascular: RRR without murmur. No JVD    ?Respiratory/Chest: CTA Bilaterally without wheezes or rales. Normal effort    ?GI/Abdomen: BS +, non-tender, non-distended ?Ext: no clubbing, cyanosis, or edema ?Psych: pleasant and cooperative  ?Musculoskeletal:  ?   left shoulder girdle less tender to ROM and palpation ?Neuro:  ?Alert and Oriented to person, place, stroke. Right central 7, reasonable tongue control.   RUE 4- to 5/5. RLE 3-4/5 LUE/LLE 4+/5. Decreased light touch on right. Better right sided attention.  Decreased LT, hypersensitivity in stocking glove distribution below knees bilaterally. DTR's 1+ ?Skin: ?   General: Skin is warm and dry.  ?  ?Assessment/Plan: ?1. Functional deficits which require 3+ hours per day of interdisciplinary therapy in a comprehensive inpatient rehab setting. ?Physiatrist is providing close team supervision and 24 hour management of active medical problems listed below. ?Physiatrist and rehab team continue to assess  barriers to discharge/monitor patient progress toward functional and medical goals ? ?Care Tool: ? ?Bathing ?   ?   ? Body parts bathed by helper: Right arm, Left arm, Chest, Abdomen, Front perineal area, Buttocks, Right upper leg, Left upper leg, Left lower leg, Right lower leg, Face ?  ?  ?Bathing assist Assist Level: 2 Helpers ?  ?  ?Upper Body Dressing/Undressing ?Upper body dressing   ?What is the patient wearing?: [place] only ?   ?Upper body assist Assist Level: 2 Helpers ?   ?Lower Body Dressing/Undressing ?Lower body dressing ? ? ?   ?What is the patient wearing?: Incontinence brief ? ?  ? ?Lower body assist Assist for lower body dressing: 2 Helpers ?   ? ?Toileting ?Toileting    ?Toileting assist Assist for toileting: 2 Helpers ?  ?  ?Transfers ?Chair/bed transfer ? ?Transfers assist ? Chair/bed transfer activity did not occur: Safety/medical concerns ? ?  ?  ?  ?Locomotion ?Ambulation ? ? ?Ambulation assist ? ? Ambulation activity did not occur: Safety/medical concerns ? ?  ?  ?   ? ?Walk 10 feet activity ? ? ?Assist ? Walk 10 feet activity did not occur: Safety/medical concerns ? ?  ?   ? ?  Walk 50 feet activity ? ? ?Assist Walk 50 feet with 2 turns activity did not occur: Safety/medical concerns ? ?  ?   ? ? ?Walk 150 feet activity ? ? ?Assist Walk 150 feet activity did not occur: Safety/medical concerns ? ?  ?  ?  ? ?Walk 10 feet on uneven surface  ?activity ? ? ?Assist Walk 10 feet on uneven surfaces activity did not occur: Safety/medical concerns ? ? ?  ?   ? ?Wheelchair ? ? ? ? ?Assist Is the patient using a wheelchair?: Yes ?Type of Wheelchair: Manual ?Wheelchair activity did not occur: Safety/medical concerns ? ?  ?   ? ? ?Wheelchair 50 feet with 2 turns activity ? ? ? ?Assist ? ?  ?Wheelchair 50 feet with 2 turns activity did not occur: Safety/medical concerns ? ? ?   ? ?Wheelchair 150 feet activity  ? ? ? ?Assist ? Wheelchair 150 feet activity did not occur: Safety/medical concerns ? ? ?    ? ?Blood pressure (!) 171/57, pulse (!) 55, temperature 98 ?F (36.7 ?C), resp. rate 16, height 5\' 6"  (1.676 m), weight 96.7 kg, SpO2 100 %. ? ?Medical Problem List and Plan: ?1. Functional deficits secondary to acute multifocal infarct including left ischemic thalamus, left parietal cortical and subcortical infarcts ?           -Continue CIR therapies including PT, OT, and SLP    ?  -still fatigues easily, pain barriers too. Have encouraged pt to keep working through these ?2.  Antithrombotics: ?-DVT/anticoagulation:  Pharmaceutical: Other (comment) Eliquis ?            -antiplatelet therapy: N/A ?3. Pain/left shoulder girdle pain: continue kpad ? -muscle rub for shoulder girdle has helped ?4. Mood: Provide emotional support ?            -antipsychotic agents: N/A ?5. Neuropsych: This patient is not capable of making decisions on her own behalf. ?6. Skin/Wound Care: Routine skin checks ?7. Fluids/Electrolytes/Nutrition: Routine in and outs ?8.  New onset atrial fibrillation with RVR.  Amiodarone 100 mg daily.  Follow-up cardiology services.    ?  HR tightly controlled ?9.  Hypertension.  Norvasc 5 mg daily, Imdur 30 mg daily.    ? Borderline control 3/3, SBP still a little high ? 3/3 -increase imdur to 60mg  daily ? -HCTZ still on hold d/t #11 ?10.  Suspicion for focal seizure.  Continue Keppra 500 mg twice daily.  EEG not completed due to glued wig could not be removed. ?11.  AKI on CKD stage IIIB- GFR 20.  Marland Kitchen  HCTZ on hold. ? 3/3 BUN/Cr are gradually improving--probably close to baseline now ?  -now off HS IVF ?  -encourage PO's ?  -lokelma x1 for hyperkalemia with improvement to 4.0   ?  -recheck bmet on Monday ?12.  Hyperlipidemia.  Continue Lipitor ?13.  Hypothyroidism.  Synthroid ?14.  Normocytic anemia/monoclonal gammopathy.  Follow-up hematology services Dr.Kale ?15.  Constipation.  MiraLAX twice daily.  Senokot daily. ?16.  Suspect UTI.  Urinalysis negative nitrite.  Empiric Keflex. ?17.Type 2 diabetes  mellitus.HG  AIC 4.5   SSI discontinued ? ?  Blood glucose of 226 on 2/25. Check periodically ?18. Transaminitis: tylenol d/ced ?19. Leukocytosis:    ?  -persistent elevation. Afebrile, no signs of infection ? -recheck Monday ? ? ?LOS: ?8 days ?A FACE TO FACE EVALUATION WAS PERFORMED ? ?Meredith Staggers ?05/10/2021, 9:15 AM  ? ?   ?

## 2021-05-10 NOTE — Progress Notes (Signed)
Physical Therapy Session Note ? ?Patient Details  ?Name: Rita Yoder ?MRN: [id number] ?Date of Birth: 1941-02-16 ? ?Today's Date: 05/10/2021 ?PT Individual Time: 5176-1607 ?PT Individual Time Calculation (min): 73 min  ? ?Short Term Goals: ?Week 1:  PT Short Term Goal 1 (Week 1): Pt will roll side to side w/ mod A ?PT Short Term Goal 2 (Week 1): Pt will transfer sit to stand w/ mod A ?PT Short Term Goal 3 (Week 1): Pt will transfer sit to stand w/ mod A ?PT Short Term Goal 4 (Week 1): Pt will tolerate standing x 3' performing UE activity. ?Week 2:    ?Week 3:    ? ?Skilled Therapeutic Interventions/Progress Updates:  ?  Pt initially supine w/granddaughter at side ?Able to lift feet slightlty for threading of pants, max multimodal cues to scan to R/use RLE ?Rolled L/R w/max cues to attend to R environment/hemibody and max assist to complete lower body, uses rails.  Raises pants w/max assist and hand over hand assist to utilize R hand w/task. ? ? ?Side to sit w/max cues for sequencing, heavy mod assist for transition. ? ?In sitting, pt able to doff gown and donn shirt w/mod to max assist, max cues to sttend to RUE, for sequencing, to complete. ?Attempted stand pivot transfer and squat pivot transfer to L but unable w/max assist and heavy cueing.  Performed sliding board transfer to wc w/mod assist.  ? ?Pt transported to gym ?Sit to stand in parallel bars w/min assist and cues for set up, hand placement. Stood approx 45 sec x 2. ?On third effort, attempted gait but pt unable to sequence steps, instead stepping in reverse.  Therapist then placed bright disks on floor and w/cues had pt step towards disks w/max multimodal cues to sustain attention to task.  Pt able to take w short steps w/overall min assist and very close wc follow.   ? ?Then discussed using RW and bariwalker obtained, but pt declined stating "I am not ready for that" despite encouragment. Discussed functional use of AD.  Pt agreed to try at another  time. ? ?Seated reaching for beanbags using RUE then tossing into box, poor grasp strength and max cues to attend to RUE use. ? ?Pt left oob in wc w/alarm belt set and needs in reach ? ? ? ?Therapy Documentation ?Precautions:  ?Precautions ?Precautions: Fall ?Precaution Comments: Rt hemiparesis and Rt inattention ?Restrictions ?Weight Bearing Restrictions: No ?  ?Other Treatments:   ? ? ? ?Therapy/Group: Individual Therapy ?Callie Fielding, PT ? ? ?Jerrilyn Cairo ?05/10/2021, 12:59 PM  ?

## 2021-05-11 NOTE — Progress Notes (Signed)
?                                                       PROGRESS NOTE ? ? ?Subjective/Complaints: ?Had a pretty good night. Was able to sleep. Happy to be getting some relative rest this weekend ? ?ROS: Patient denies fever, rash, sore throat, blurred vision, dizziness, nausea, vomiting, diarrhea, cough, shortness of breath or chest pain, joint or back/neck pain, headache, or mood change.    ? ?Objective: ?  ?No results found. ?No results for input(s): WBC, HGB, HCT, PLT in the last 72 hours. ? ?Recent Labs  ?  05/09/21 ?1497  ?NA 138  ?K 4.0  ?CL 111  ?CO2 18*  ?GLUCOSE 82  ?BUN 59*  ?CREATININE 2.47*  ?CALCIUM 8.8*  ? ? ?Intake/Output Summary (Last 24 hours) at 05/11/2021 0946 ?Last data filed at 05/10/2021 1330 ?Gross per 24 hour  ?Intake 120 ml  ?Output --  ?Net 120 ml  ?  ? ?  ? ?Physical Exam: ?Vital Signs ?Blood pressure (!) 160/61, pulse 61, temperature 98.5 ?F (36.9 ?C), temperature source Oral, resp. rate 18, height '5\' 6"'$  (1.676 m), weight 96.7 kg, SpO2 100 %. ?Constitutional: No distress . Vital signs reviewed. ?HEENT: NCAT, EOMI, oral membranes moist ?Neck: supple ?Cardiovascular: RRR without murmur. No JVD    ?Respiratory/Chest: CTA Bilaterally without wheezes or rales. Normal effort    ?GI/Abdomen: BS +, non-tender, non-distended ?Ext: no clubbing, cyanosis, or edema ?Psych: pleasant and cooperative  ?Musculoskeletal:  ?   left shoulder girdle pain improved ?Neuro:  ?Very Alert and Oriented to person, place, stroke. Right central 7, reasonable tongue control.   RUE 4- to 5/5. RLE 3-4/5 LUE/LLE 4+/5. Decreased light touch on right. Better right sided attention.  Decreased LT, hypersensitivity in stocking glove distribution below knees bilaterally. DTR's 1+ ?Skin: ?   General: Skin is warm and dry.  ?  ?Assessment/Plan: ?1. Functional deficits which require 3+ hours per day of interdisciplinary therapy in a comprehensive inpatient rehab setting. ?Physiatrist is providing close team supervision and 24 hour  management of active medical problems listed below. ?Physiatrist and rehab team continue to assess barriers to discharge/monitor patient progress toward functional and medical goals ? ?Care Tool: ? ?Bathing ?   ?   ? Body parts bathed by helper: Right arm, Left arm, Chest, Abdomen, Front perineal area, Buttocks, Right upper leg, Left upper leg, Left lower leg, Right lower leg, Face ?  ?  ?Bathing assist Assist Level: 2 Helpers ?  ?  ?Upper Body Dressing/Undressing ?Upper body dressing   ?What is the patient wearing?: [place] only ?   ?Upper body assist Assist Level: 2 Helpers ?   ?Lower Body Dressing/Undressing ?Lower body dressing ? ? ?   ?What is the patient wearing?: Incontinence brief ? ?  ? ?Lower body assist Assist for lower body dressing: 2 Helpers ?   ? ?Toileting ?Toileting    ?Toileting assist Assist for toileting: 2 Helpers ?  ?  ?Transfers ?Chair/bed transfer ? ?Transfers assist ? Chair/bed transfer activity did not occur: Safety/medical concerns ? ?  ?  ?  ?Locomotion ?Ambulation ? ? ?Ambulation assist ? ? Ambulation activity did not occur: Safety/medical concerns ? ?  ?  ?   ? ?Walk 10 feet activity ? ? ?Assist ? Walk 10  feet activity did not occur: Safety/medical concerns ? ?  ?   ? ?Walk 50 feet activity ? ? ?Assist Walk 50 feet with 2 turns activity did not occur: Safety/medical concerns ? ?  ?   ? ? ?Walk 150 feet activity ? ? ?Assist Walk 150 feet activity did not occur: Safety/medical concerns ? ?  ?  ?  ? ?Walk 10 feet on uneven surface  ?activity ? ? ?Assist Walk 10 feet on uneven surfaces activity did not occur: Safety/medical concerns ? ? ?  ?   ? ?Wheelchair ? ? ? ? ?Assist Is the patient using a wheelchair?: Yes ?Type of Wheelchair: Manual ?Wheelchair activity did not occur: Safety/medical concerns ? ?  ?   ? ? ?Wheelchair 50 feet with 2 turns activity ? ? ? ?Assist ? ?  ?Wheelchair 50 feet with 2 turns activity did not occur: Safety/medical concerns ? ? ?   ? ?Wheelchair 150 feet  activity  ? ? ? ?Assist ? Wheelchair 150 feet activity did not occur: Safety/medical concerns ? ? ?   ? ?Blood pressure (!) 160/61, pulse 61, temperature 98.5 ?F (36.9 ?C), temperature source Oral, resp. rate 18, height '5\' 6"'$  (1.676 m), weight 96.7 kg, SpO2 100 %. ? ?Medical Problem List and Plan: ?1. Functional deficits secondary to acute multifocal infarct including left ischemic thalamus, left parietal cortical and subcortical infarcts ?          -Continue CIR therapies including PT, OT, and SLP    ? -team providing daily encouragement and motivation ?2.  Antithrombotics: ?-DVT/anticoagulation:  Pharmaceutical: Other (comment) Eliquis ?            -antiplatelet therapy: N/A ?3. Pain/left shoulder girdle pain: continue kpad ? -muscle rub for shoulder girdle has helped ?4. Mood: Provide emotional support ?            -antipsychotic agents: N/A ?5. Neuropsych: This patient is not capable of making decisions on her own behalf. ?6. Skin/Wound Care: Routine skin checks ?7. Fluids/Electrolytes/Nutrition: Routine in and outs ?8.  New onset atrial fibrillation with RVR.  Amiodarone 100 mg daily.  Follow-up cardiology services.    ?  HR tightly controlled ?9.  Hypertension.  Norvasc 5 mg daily, Imdur 30 mg daily.    ? Borderline control 3/3, SBP still a little high ? 3/3 -increased imdur to '60mg'$  daily--observe this weekend ? -HCTZ still on hold d/t #11 ?10.  Suspicion for focal seizure.  Continue Keppra 500 mg twice daily.  EEG not completed due to glued wig could not be removed. ?11.  AKI on CKD stage IIIB- GFR 20.  Marland Kitchen  HCTZ on hold. ? 3/3 BUN/Cr are gradually improving--probably close to baseline now ?  -now off HS IVF ?  -encourage PO's ?  -lokelma x1 for hyperkalemia with improvement to 4.0   ?  -f/u BMET on Monday ?12.  Hyperlipidemia.  Continue Lipitor ?13.  Hypothyroidism.  Synthroid ?14.  Normocytic anemia/monoclonal gammopathy.  Follow-up hematology services Dr.Kale ?15.  Constipation.  MiraLAX twice daily.   Senokot daily. ?16.  Suspect UTI.  Urinalysis negative nitrite.  Empiric Keflex completed. ?17.Type 2 diabetes mellitus.HG  AIC 4.5   SSI discontinued ? ?  Blood glucose of 226 on 2/25. Check periodically ?18. Transaminitis: tylenol d/ced ?19. Leukocytosis:    ?  -persistent elevation. Afebrile, no signs of infection ? -check cbc Monday ? ? ?LOS: ?9 days ?A FACE TO FACE EVALUATION WAS PERFORMED ? ?Meredith Staggers ?05/11/2021, 9:46  AM  ? ?   ?

## 2021-05-11 NOTE — Progress Notes (Signed)
Speech Language Pathology Daily Session Note ? ?Patient Details  ?Name: Rita Yoder ?MRN: [id number] ?Date of Birth: [birth year] ? ?Today's Date: 05/11/2021 ?SLP Individual Time: 4196-2229 ?SLP Individual Time Calculation (min): 45 min ? ?Short Term Goals: ?Week 2: SLP Short Term Goal 1 (Week 2): Patient will scan to right visual field during functional tasks in 50% of opportunities with Mod A multimodal cues. ?SLP Short Term Goal 2 (Week 2): Patient will demonstrate orientation to month, place and situation with Mod A multimodal cues. ?SLP Short Term Goal 3 (Week 2): Patient will initiate functional tasks in 75% of opportunities with Min multimodal cues. ?SLP Short Term Goal 4 (Week 2): Patient will utilize word-finding strategies at the phrase level with Mod A multimodal cues. ?SLP Short Term Goal 5 (Week 2): Patient will express basic wants/needs at the sentence level with Mod A multimodal cues. ? ?Skilled Therapeutic Interventions: Skilled SLP intervention focused on cognition. Pt completed visual attention task with max A to grab cards shown to her in right visual field and placing them in a pile in left visual field. She increased accuracy with auditory cues with slp tapping card and verbal instructions to look in right visual field. Pt required breaks throughout session and tv turned off to increase attention throughout session. She participated well in [place] session. Cont with therapy per plan of care.  ?   ? ?Pain ?Pain Assessment ?Pain Scale: Faces ?Pain Score: 0-No pain ?Faces Pain Scale: No hurt ? ?Therapy/Group: Individual Therapy ? ?Gregary Signs A Lutie Pickler ?05/11/2021, 9:59 AM ?

## 2021-05-12 MED ORDER — LEVETIRACETAM 100 MG/ML PO SOLN
500.0000 mg | Freq: Two times a day (BID) | ORAL | Status: DC
Start: 2021-05-12 — End: 2021-05-24
  Administered 2021-05-12 – 2021-05-24 (×24): 500 mg via ORAL
  Filled 2021-05-12 (×24): qty 5

## 2021-05-12 NOTE — Progress Notes (Signed)
Physical Therapy Weekly Progress Note ? ?Patient Details  ?Name: Rita Yoder ?MRN: [id number] ?Date of Birth: 1941/02/03 ? ?Beginning of progress report period: May 03, 2021 ?End of progress report period: May 12, 2021 ? ?Today's Date: 05/12/2021 ?PT Individual Time: 4098-1191 ?PT Individual Time Calculation (min): 43 min  ? ?Patient has met 2 of 3 short term goals.  Patient with slow progress at beginning of the week limited by decreased activity tolerance, R side neglect and significant patient fear of falling with mobility. Patient progressed with encouragement from therapy team and family to performing bed mobility and transfers with min-mod A, and gait up to 8 feet with min A using a RW. She continues to be limited with standing and sitting tolerance OOB, will continue to progress standing with functional activities and adjust w/c for increased sitting tolerance and safety in sitting. Family education initiated on patient progress, goals, and expectations for recovery, will continue education throughout patient's stay and initiate hands-on training closer to patient's discharge.  ? ?Patient continues to demonstrate the following deficits muscle weakness, decreased cardiorespiratoy endurance, abnormal tone, decreased coordination, and decreased motor planning, decreased attention to right and decreased motor planning, decreased attention, decreased awareness, decreased problem solving, and decreased memory, and decreased sitting balance, decreased standing balance, decreased postural control, hemiplegia, and decreased balance strategies and therefore will continue to benefit from skilled PT intervention to increase functional independence with mobility. ? ?Patient progressing toward long term goals..  Continue plan of care. ? ?PT Short Term Goals ?Week 1:  PT Short Term Goal 1 (Week 1): Pt will roll side to side w/ mod A ?PT Short Term Goal 1 - Progress (Week 1): Met ?PT Short Term Goal 2 (Week 1): Pt  will transfer sit to stand w/ mod A ?PT Short Term Goal 2 - Progress (Week 1): Met ?PT Short Term Goal 3 (Week 1): Pt will transfer sit to stand w/ mod A ?PT Short Term Goal 3 - Progress (Week 1): Discontinued (comment) (goal repeated) ?PT Short Term Goal 4 (Week 1): Pt will tolerate standing x 3' performing UE activity. ?PT Short Term Goal 4 - Progress (Week 1): Progressing toward goal ? ?Skilled Therapeutic Interventions/Progress Updates:  ?   ?Patient in bed upon PT arrival. Patient alert and agreeable to PT session. Patient reported 3-5/10 L hip pain during session, RN made aware. PT provided repositioning, rest breaks, and distraction as pain interventions throughout session.  ? ?Therapeutic Activity: ?Bed Mobility: Patient performed supine to/from sit with min A to come to sitting and mod A to return to supine. Provided verbal cues for transitioning through L side-lying with improved use of R hemi-body initiation and use with mobility today. ?Transfers: Patient performed stand pivot bed>w/c and w/c<>mat table and sit to/from stand x1 with min-mod A with PT supporting RW as patient pulls up using both hands to boost up at this time. Provided verbal cues for forward weight shift and increased knee/hip/trunk extension in standing R>L, and cues for sequencing and locating chair/mat during pivot. ? ?Gait Training:  ?Patient ambulated 8 feet using RW with min A for AD management for forward progression. Ambulated with decreased gait speed, decreased step length and height R>L, R posterior pelvic rotation, forward trunk lean, and downward head gaze. Provided verbal cues for erect posture, RW management and safe proximity, and increased R step height. ? ?Therapeutic Exercise: ?Patient performed the following exercises with verbal and tactile cues for proper technique. ?-seated B shoulder flexion holding 1  lb bar for AAROM of R upper extremity with use of grasp and focus on increased elbow extension 2x10 ?-seated B  bicep curl holding 1 lb bar for AAROM of R upper extremity with use of grasp and focus on full ROM 2x10 ? ?PT attempted to drop bariatric w/c to hemi-height for improved sitting tolerance and patient safety with use of w/c, however, unable to adjust due to appropriate tools unavailable at this time. Will re-attempt once tools are located.  ? ?Patient in bed at end of session with breaks locked, bed alarm set, and all needs within reach.  ? ?Therapy Documentation ?Precautions:  ?Precautions ?Precautions: Fall ?Precaution Comments: Rt hemiparesis and Rt inattention ?Restrictions ?Weight Bearing Restrictions: No ? ? ?Therapy/Group: Individual Therapy ? ?Doreene Burke PT, DPT ? ?05/12/2021, 5:32 PM  ?

## 2021-05-12 NOTE — Progress Notes (Signed)
Pt on '500mg'$  KEPPRA unable to take medication whole pt takes medication crush in applesauce, called pharmacy to have medication change or oral suspension.  ? ?Dayna Ramus ? ?

## 2021-05-12 NOTE — Progress Notes (Signed)
Physical Therapy Note ? ?Patient Details  ?Name: Rita Yoder ?MRN: [id number] ?Date of Birth: 1940-05-14 ?Today's Date: 05/12/2021 ? ? ? ?Attempted to see patient for scheduled therapy session. Pt received supine in bed asleep, arousable but declines participation in therapy session. Encouraged pt to perform OOB mobility to improve strength and work towards d/c. Pt declines. Encouraged pt to perform bed level therex for strengthening, pt again refuses due to fatigue. Pt missed 60 min of scheduled therapy session due to refusal.  ? ? ?Excell Seltzer, PT, DPT, CSRS ? ?05/12/2021, 3:30 PM  ?

## 2021-05-13 LAB — CBC
HCT: 28.8 % — ABNORMAL LOW (ref 36.0–46.0)
Hemoglobin: 9.2 g/dL — ABNORMAL LOW (ref 12.0–15.0)
MCH: 32.1 pg (ref 26.0–34.0)
MCHC: 31.9 g/dL (ref 30.0–36.0)
MCV: 100.3 fL — ABNORMAL HIGH (ref 80.0–100.0)
Platelets: 310 10*3/uL (ref 150–400)
RBC: 2.87 MIL/uL — ABNORMAL LOW (ref 3.87–5.11)
RDW: 12.2 % (ref 11.5–15.5)
WBC: 9.1 10*3/uL (ref 4.0–10.5)
nRBC: 0 % (ref 0.0–0.2)

## 2021-05-13 LAB — BASIC METABOLIC PANEL
Anion gap: 9 (ref 5–15)
BUN: 51 mg/dL — ABNORMAL HIGH (ref 8–23)
CO2: 18 mmol/L — ABNORMAL LOW (ref 22–32)
Calcium: 9 mg/dL (ref 8.9–10.3)
Chloride: 112 mmol/L — ABNORMAL HIGH (ref 98–111)
Creatinine, Ser: 2.81 mg/dL — ABNORMAL HIGH (ref 0.44–1.00)
GFR, Estimated: 16 mL/min — ABNORMAL LOW (ref >60)
Glucose, Bld: 88 mg/dL (ref 70–99)
Potassium: 4.2 mmol/L (ref 3.5–5.1)
Sodium: 139 mmol/L (ref 135–145)

## 2021-05-13 MED ORDER — AMLODIPINE BESYLATE 10 MG PO TABS
10.0000 mg | ORAL_TABLET | Freq: Every day | ORAL | Status: DC
  Administered 2021-05-14 – 2021-05-24 (×11): 10 mg via ORAL
  Filled 2021-05-13 (×11): qty 1

## 2021-05-13 NOTE — Progress Notes (Signed)
Occupational Therapy Session Note ? ?Patient Details  ?Name: Rita Yoder ?MRN: [id number] ?Date of Birth: 11-Apr-1940 ? ?Today's Date: 05/13/2021 ?OT Individual Time: 1303-1400 ?OT Individual Time Calculation (min): 57 min  ? ? ?Short Term Goals: ?Week 2:  OT Short Term Goal 1 (Week 2): Pt will transfer to [facility] wiht +1 A and LRAD to decrease BOC ?OT Short Term Goal 2 (Week 2): Pt will complete 1/3 components of toileting with MIN A for balance ?OT Short Term Goal 3 (Week 2): pt will thread 1LE into pants with no  more than CGA for sitting balance ? ?Skilled Therapeutic Interventions/Progress Updates:  ?  Pt received supine with no c/o pain. Discussed home equipment with pt and son [name]. She completed bed mobility to EOB with min A. She completed a sit > stand with min A from EOB, using the RW. She completed a stand pivot transfer with several steps to transfer to the bariatric BSC. She required max A for toileting tasks. She stood with min A and required max A for toileting tasks. She was able to stand for 30 seconds before sitting back down to rest. She required a total of 3 stands to complete thorough hygiene. She did attempt to do peri hygiene herself, reaching through the middle and unfortunately bringing stool forward, cueing for wiping front to back to reduce UTI risk. She completed 5 steps back to bed with min A overall, standing with CGA from the [facility] after 2 attempts. She required mod A to get back into bed supine. She completed 2x10-15 dowel rod therex for BUE shoulder strengthening required for BADLs/functional transfers as follows with demo cuing and 3 # dowel rod ? ?Shoulder flex/ext, shoulder press ?Elbow flex/ext, chest press ?She was left supine with all needs met, son [name] present.  ? ? ?Therapy Documentation ?Precautions:  ?Precautions ?Precautions: Fall ?Precaution Comments: Rt hemiparesis and Rt inattention ?Restrictions ?Weight Bearing Restrictions: No ? ? ?Therapy/Group: Individual  Therapy ? ?Curtis Sites ?05/13/2021, 6:25 AM ?

## 2021-05-13 NOTE — Progress Notes (Signed)
Patient had BM today without sorbitol  ?

## 2021-05-13 NOTE — Progress Notes (Signed)
Speech Language Pathology Daily Session Note ? ?Patient Details  ?Name: Rita Yoder ?MRN: [id number] ?Date of Birth: 11-25-1940 ? ?Today's Date: 05/13/2021 ?SLP Individual Time: 5051-8335 ?SLP Individual Time Calculation (min): 30 min ? ?Short Term Goals: ?Week 2: SLP Short Term Goal 1 (Week 2): Patient will scan to right visual field during functional tasks in 50% of opportunities with Mod A multimodal cues. ?SLP Short Term Goal 2 (Week 2): Patient will demonstrate orientation to month, place and situation with Mod A multimodal cues. ?SLP Short Term Goal 3 (Week 2): Patient will initiate functional tasks in 75% of opportunities with Min multimodal cues. ?SLP Short Term Goal 4 (Week 2): Patient will utilize word-finding strategies at the phrase level with Mod A multimodal cues. ?SLP Short Term Goal 5 (Week 2): Patient will express basic wants/needs at the sentence level with Mod A multimodal cues. ? ?Skilled Therapeutic Interventions: Skilled treatment session focused on cognitive-linguistic goals. Patient required overall Min verbal cues to recall events from previous therapy sessions. Patient was overall Mod I for word-finding at the sentence level throughout informal conversation. Patient with intermittent groping for word during a structured language task but was overall Mod I with extra time. Patient with increased ability to scan to right field of environment and attention to task for ~20 minutes with Mod I. Patient left upright in bed with alarm on and all needs within reach. Continue with current plan of care.  ?   ? ?Pain ?No/Denies Pain  ? ?Therapy/Group: Individual Therapy ? ?Rita Yoder ?05/13/2021, 3:54 PM ?

## 2021-05-13 NOTE — Progress Notes (Signed)
Patient has not had a BM since 3/2. Patient does not like Miralax however agrees to sorbitol this evening.  ?

## 2021-05-13 NOTE — Progress Notes (Signed)
?                                                       PROGRESS NOTE ? ? ?Subjective/Complaints: ?Rita Yoder noted that she was feeling "off" this morning and blood pressure was elevated to 176/54, improved to 155/63 on repeat and she is feeling better now ? ?ROS: Patient denies fever, rash, sore throat, blurred vision, dizziness, nausea, vomiting, diarrhea, cough, shortness of breath or chest pain, joint or back/neck pain, headache, or mood change.    ? ?Objective: ?  ?No results found. ?Recent Labs  ?  05/13/21 ?1761  ?WBC 9.1  ?HGB 9.2*  ?HCT 28.8*  ?PLT 310  ? ? ?Recent Labs  ?  05/13/21 ?6073  ?NA 139  ?K 4.2  ?CL 112*  ?CO2 18*  ?GLUCOSE 88  ?BUN 51*  ?CREATININE 2.81*  ?CALCIUM 9.0  ? ? ?Intake/Output Summary (Last 24 hours) at 05/13/2021 0931 ?Last data filed at 05/13/2021 0741 ?Gross per 24 hour  ?Intake 695 ml  ?Output --  ?Net 695 ml  ?  ? ?  ? ?Physical Exam: ?Vital Signs ?Blood pressure (!) 146/60, pulse 60, temperature 98.9 ?F (37.2 ?C), temperature source Oral, resp. rate 18, height '5\' 6"'$  (1.676 m), weight 95.5 kg, SpO2 97 %. ?Gen: no distress, normal appearing ?HEENT: oral mucosa pink and moist, NCAT ?Cardio: Reg rate ?Chest: normal effort, normal rate of breathing ?Abd: soft, non-distended ?Ext: no edema ?Psych: pleasant, normal affect ?Skin: intact ? ?Musculoskeletal:  ?   left shoulder girdle pain improved ?Neuro:  ?Very Alert and Oriented to person, place, stroke. Right central 7, reasonable tongue control.   RUE 4- to 5/5. RLE 3-4/5 LUE/LLE 4+/5. Decreased light touch on right. Better right sided attention.  Decreased LT, hypersensitivity in stocking glove distribution below knees bilaterally. DTR's 1+ ?Skin: ?   General: Skin is warm and dry.  ?  ?Assessment/Plan: ?1. Functional deficits which require 3+ hours per day of interdisciplinary therapy in a comprehensive inpatient rehab setting. ?Physiatrist is providing close team supervision and 24 hour management of active medical problems listed  below. ?Physiatrist and rehab team continue to assess barriers to discharge/monitor patient progress toward functional and medical goals ? ?Care Tool: ? ?Bathing ?   ?   ? Body parts bathed by helper: Right arm, Left arm, Chest, Abdomen, Front perineal area, Buttocks, Right upper leg, Left upper leg, Left lower leg, Right lower leg, Face ?  ?  ?Bathing assist Assist Level: 2 Helpers ?  ?  ?Upper Body Dressing/Undressing ?Upper body dressing   ?What is the patient wearing?: [place] only ?   ?Upper body assist Assist Level: 2 Helpers ?   ?Lower Body Dressing/Undressing ?Lower body dressing ? ? ?   ?What is the patient wearing?: Incontinence brief ? ?  ? ?Lower body assist Assist for lower body dressing: 2 Helpers ?   ? ?Toileting ?Toileting    ?Toileting assist Assist for toileting: 2 Helpers ?  ?  ?Transfers ?Chair/bed transfer ? ?Transfers assist ? Chair/bed transfer activity did not occur: Safety/medical concerns ? ?Chair/bed transfer assist level: Minimal Assistance - Patient > 75% ?Chair/bed transfer assistive device: Walker ?  ?Locomotion ?Ambulation ? ? ?Ambulation assist ? ? Ambulation activity did not occur: Safety/medical concerns ? ?Assist level: Minimal Assistance - Patient >  75% ?Assistive device: Walker-rolling ?Max distance: 8 ft  ? ?Walk 10 feet activity ? ? ?Assist ? Walk 10 feet activity did not occur: Safety/medical concerns ? ?  ?   ? ?Walk 50 feet activity ? ? ?Assist Walk 50 feet with 2 turns activity did not occur: Safety/medical concerns ? ?  ?   ? ? ?Walk 150 feet activity ? ? ?Assist Walk 150 feet activity did not occur: Safety/medical concerns ? ?  ?  ?  ? ?Walk 10 feet on uneven surface  ?activity ? ? ?Assist Walk 10 feet on uneven surfaces activity did not occur: Safety/medical concerns ? ? ?  ?   ? ?Wheelchair ? ? ? ? ?Assist Is the patient using a wheelchair?: Yes ?Type of Wheelchair: Manual ?Wheelchair activity did not occur: Safety/medical concerns ? ?  ?   ? ? ?Wheelchair 50  feet with 2 turns activity ? ? ? ?Assist ? ?  ?Wheelchair 50 feet with 2 turns activity did not occur: Safety/medical concerns ? ? ?   ? ?Wheelchair 150 feet activity  ? ? ? ?Assist ? Wheelchair 150 feet activity did not occur: Safety/medical concerns ? ? ?   ? ?Blood pressure (!) 146/60, pulse 60, temperature 98.9 ?F (37.2 ?C), temperature source Oral, resp. rate 18, height '5\' 6"'$  (1.676 m), weight 95.5 kg, SpO2 97 %. ? ?Medical Problem List and Plan: ?1. Functional deficits secondary to acute multifocal infarct including left ischemic thalamus, left parietal cortical and subcortical infarcts ?          -Continue CIR therapies including PT, OT, and SLP    ? -team providing daily encouragement and motivation ?2.  Antithrombotics: ?-DVT/anticoagulation:  Pharmaceutical: Other (comment) Eliquis ?            -antiplatelet therapy: N/A ?3. Pain/left shoulder girdle pain: continue kpad ? -muscle rub for shoulder girdle has helped ? -provided list of foods that can help decrease pain ?4. Mood: Provide emotional support ?            -antipsychotic agents: N/A ?5. Neuropsych: This patient is not capable of making decisions on her own behalf. ?6. Skin/Wound Care: Routine skin checks ?7. Fluids/Electrolytes/Nutrition: Routine in and outs ?8.  New onset atrial fibrillation with RVR.  Amiodarone 100 mg daily.  Follow-up cardiology services.    ?  HR tightly controlled ?9.  Hypertension.  Norvasc 5 mg daily, Imdur 30 mg daily.    ? Borderline control 3/3, SBP still a little high ? 3/3 -increased imdur to '60mg'$  daily--observe this weekend ? -HCTZ still on hold d/t #11 ? 3/6: increase norvasc to '10mg'$ .  ?10.  Suspicion for focal seizure.  Continue Keppra 500 mg twice daily.  EEG not completed due to glued wig could not be removed. ?11.  AKI on CKD stage IIIB- GFR 20.  Marland Kitchen  HCTZ on hold. ? 3/3 BUN/Cr are gradually improving--probably close to baseline now ?  -now off HS IVF ?  -encourage PO's ?  -lokelma x1 for hyperkalemia with  improvement to 4.0   ?  -f/u BMET on Monday ?12.  Hyperlipidemia.  Continue Lipitor ?13.  Hypothyroidism.  Synthroid ?14.  Normocytic anemia/monoclonal gammopathy.  Follow-up hematology services Dr.Kale ?15.  Constipation.  MiraLAX twice daily.  Senokot daily. ?16.  Suspect UTI.  Urinalysis negative nitrite.  Empiric Keflex completed. ?17.Type 2 diabetes mellitus.HG  AIC 4.5   SSI discontinued ? ?  Blood glucose of 226 on 2/25. Check periodically ?18. Transaminitis: tylenol  d/ced ?58. Leukocytosis:    ?  resolved monitor weekly.  ? ? ?LOS: ?11 days ?A FACE TO FACE EVALUATION WAS PERFORMED ? ?Martha Clan P Bari Leib ?05/13/2021, 9:31 AM  ? ?   ?

## 2021-05-13 NOTE — Progress Notes (Signed)
Physical Therapy Session Note ? ?Patient Details  ?Name: Rita Yoder ?MRN: [id number] ?Date of Birth: 04/28/1940 ? ?Today's Date: 05/13/2021 ?PT Individual Time: 2330-0762 ?PT Individual Time Calculation (min): 75 min  ? ?Short Term Goals: ?Week 2:  PT Short Term Goal 1 (Week 2): Patietn will perform bed mobility with min A using hospital bed features >75% of the time. ?PT Short Term Goal 2 (Week 2): Patient will perform basic transfer with min A using LRAD consistently. ?PT Short Term Goal 3 (Week 2): Patient will ambulate >25 feet using LRAD with min A. ? ?Skilled Therapeutic Interventions/Progress Updates:  ?   ?Patient in bed with her granddaughter at bedside upon PT arrival. Patient alert and agreeable to PT session. Patient reported 5/10 L hip pain during session, RN made aware. PT provided repositioning, rest breaks, and distraction as pain interventions throughout session. Patient in bright spirits this morning laughing with her granddaughter and joking with PT. Once her granddaughter left patient reports feeling like her BP is high, unable to describe symptoms, but reports she "knows" when its high. ? ?Vitals: BP 176/54, HR 53, BP confirmed x2 with mechanical cuff, RN made aware and provided BP medications along with other morning medications during session. Focused on bed level mobility for 30 min prior to reassessment of vitals. Cued patient on diaphragmatic breathing x5 min at rest prior to reassessment. ? ?Vitals: 155/63, HR 55, patient continues to report symptoms of elevated BP. Limited session to bed mobility for BP control at this time.  ? ?Therapeutic Activity: ?Bed Mobility: Patient was incontinent of bladder at beginning of session. Performed rolling R/L with min A for lateral scoot and supervision to roll with use of bed rail, increased time on R for muscle activation and motor planning. Provided cues for bending opposite lower extremity and reaching with opposite hand to pull on the bed  rail. Patient performed scooting up in bed with mod A +2 using B lower extremities to push herself up.  ? ?Educated patient on bowl/bladder incontinence following stroke and improvements with timed toileting. Will focus on increased transfer training to promote toileting to [facility] with staff. Patient in agreement. ? ?Lowered 22"x18" w/c to hemi-height during remainder of session to improve w/c propulsion and sitting tolerance.  ? ?Patient in bed at end of session with breaks locked, bed alarm set, and all needs within reach.  ? ?Therapy Documentation ?Precautions:  ?Precautions ?Precautions: Fall ?Precaution Comments: Rt hemiparesis and Rt inattention ?Restrictions ?Weight Bearing Restrictions: No ? ? ? ?Therapy/Group: Individual Therapy ? ?Doreene Burke PT, DPT ? ?05/13/2021, 4:01 PM  ?

## 2021-05-14 NOTE — Progress Notes (Signed)
Physical Therapy Session Note ? ?Patient Details  ?Name: Rita Yoder ?MRN: [id number] ?Date of Birth: Mar 25, 1940 ? ?Today's Date: 05/14/2021 ?PT Individual Time: 1130-1200 ?PT Individual Time Calculation (min): 30 min  ? ?Short Term Goals: ?Week 2:  PT Short Term Goal 1 (Week 2): Patietn will perform bed mobility with min A using hospital bed features >75% of the time. ?PT Short Term Goal 2 (Week 2): Patient will perform basic transfer with min A using LRAD consistently. ?PT Short Term Goal 3 (Week 2): Patient will ambulate >25 feet using LRAD with min A. ? ?Skilled Therapeutic Interventions/Progress Updates:  ?   ?Patient in bed with her son in the room upon PT arrival. Patient alert and agreeable to PT session. Patient denied pain during session. ? ?Utilized music by Merry Proud, per patient selection, to motivate patient and encourage mobility throughout session.  ? ?Therapeutic Activity: ?Bed Mobility: Patient performed supine to sit with min A-CGA in a flat bed without use of bed rail. Provided min cues for sequencing throughout. ?Transfers: Patient performed sit to/from stand x3 with min A using RW. Provided verbal cues for forward weight shift. Patient with increased comfort pulling up on the RW due to body habitus, required stabilization of RW with this technique. ? ?Gait Training:  ?Patient ambulated 26 feet using RW with min A for steadying support and AD management due to R upper extremity weakness with w/c follow for safety. Ambulated with decreased gait speed, decreased step length and height R>L, R posterior pelvic rotation, forward trunk lean, and downward head gaze. Provided verbal cues for erect posture, RW management and safe proximity, and increased R step height. ? ?Patient trialed 3 cushions in hemi-height w/c foam, hybrid foam-gel, and hybrid foam-Roho. Reported improved comfort in hybrid foam-Roho cushion and agreeable to sit up in the w/c for 1 hour following session to eat lunch. NT made  aware.  ? ?Patient in w/c with her son in the room at end of session with breaks locked, seat belt alarm set, and all needs within reach.  ? ?Therapy Documentation ?Precautions:  ?Precautions ?Precautions: Fall ?Precaution Comments: Rt hemiparesis and Rt inattention ?Restrictions ?Weight Bearing Restrictions: No ?General: ?  ?Vital Signs: ?Therapy Vitals ?Temp: 98.4 ?F (36.9 ?C) ?Temp Source: Oral ?Pulse Rate: (!) 54 ?Resp: 15 ?BP: (!) 161/62 ?Patient Position (if appropriate): Lying ?Oxygen Therapy ?SpO2: 100 % ?O2 Device: Room Air ?Pain: ?  ?Mobility: ?  ?Locomotion : ?   ?Trunk/Postural Assessment : ?   ?Balance: ?  ?Exercises: ?  ?Other Treatments:   ? ? ? ?Therapy/Group: Individual Therapy ? ?Doreene Burke PT, DPT ? ?05/14/2021, 5:27 PM  ?

## 2021-05-14 NOTE — Progress Notes (Signed)
?                                                       PROGRESS NOTE ? ? ?Subjective/Complaints: ?Pt at EOB with OT. Feels generally sore. Doesn't appear to be in any kind of distress ? ?ROS: Patient denies fever, rash, sore throat, blurred vision, dizziness, nausea, vomiting, diarrhea, cough, shortness of breath or chest pain,   headache, or mood change.  ? ?Objective: ?  ?No results found. ?Recent Labs  ?  05/13/21 ?2229  ?WBC 9.1  ?HGB 9.2*  ?HCT 28.8*  ?PLT 310  ? ? ?Recent Labs  ?  05/13/21 ?7989  ?NA 139  ?K 4.2  ?CL 112*  ?CO2 18*  ?GLUCOSE 88  ?BUN 51*  ?CREATININE 2.81*  ?CALCIUM 9.0  ? ? ?Intake/Output Summary (Last 24 hours) at 05/14/2021 0949 ?Last data filed at 05/14/2021 0700 ?Gross per 24 hour  ?Intake 478 ml  ?Output --  ?Net 478 ml  ?  ? ?  ? ?Physical Exam: ?Vital Signs ?Blood pressure (!) 163/61, pulse 63, temperature 97.7 ?F (36.5 ?C), temperature source Oral, resp. rate 14, height '5\' 6"'$  (1.676 m), weight 95.8 kg, SpO2 100 %. ?Constitutional: No distress . Vital signs reviewed. obese ?HEENT: NCAT, EOMI, oral membranes moist ?Neck: supple ?Cardiovascular: RRR without murmur. No JVD    ?Respiratory/Chest: CTA Bilaterally without wheezes or rales. Normal effort    ?GI/Abdomen: BS +, non-tender, non-distended ?Ext: no clubbing, cyanosis, or edema ?Psych: pleasant and cooperative  ?Skin: intact ? ?Musculoskeletal:  ?   moving all 4 limbs. Does not appear to be in pain while sitting EOB ?Neuro:  ?Very Alert and Oriented to person, place, stroke. Right central 7, reasonable tongue control.   RUE 4- to 5/5. RLE 3-4/5 LUE/LLE 4+/5. Decreased light touch on right. Better right sided attention.  Decreased LT, hypersensitivity in stocking glove distribution below knees bilaterally. DTR's 1+ ?  ?  ?Assessment/Plan: ?1. Functional deficits which require 3+ hours per day of interdisciplinary therapy in a comprehensive inpatient rehab setting. ?Physiatrist is providing close team supervision and 24 hour management  of active medical problems listed below. ?Physiatrist and rehab team continue to assess barriers to discharge/monitor patient progress toward functional and medical goals ? ?Care Tool: ? ?Bathing ?   ?   ? Body parts bathed by helper: Right arm, Left arm, Chest, Abdomen, Front perineal area, Buttocks, Right upper leg, Left upper leg, Left lower leg, Right lower leg, Face ?  ?  ?Bathing assist Assist Level: 2 Helpers ?  ?  ?Upper Body Dressing/Undressing ?Upper body dressing   ?What is the patient wearing?: [place] only ?   ?Upper body assist Assist Level: 2 Helpers ?   ?Lower Body Dressing/Undressing ?Lower body dressing ? ? ?   ?What is the patient wearing?: Incontinence brief ? ?  ? ?Lower body assist Assist for lower body dressing: 2 Helpers ?   ? ?Toileting ?Toileting    ?Toileting assist Assist for toileting: 2 Helpers ?  ?  ?Transfers ?Chair/bed transfer ? ?Transfers assist ? Chair/bed transfer activity did not occur: Safety/medical concerns ? ?Chair/bed transfer assist level: Minimal Assistance - Patient > 75% ?Chair/bed transfer assistive device: Walker ?  ?Locomotion ?Ambulation ? ? ?Ambulation assist ? ? Ambulation activity did not occur: Safety/medical concerns ? ?  Assist level: Minimal Assistance - Patient > 75% ?Assistive device: Walker-rolling ?Max distance: 8 ft  ? ?Walk 10 feet activity ? ? ?Assist ? Walk 10 feet activity did not occur: Safety/medical concerns ? ?  ?   ? ?Walk 50 feet activity ? ? ?Assist Walk 50 feet with 2 turns activity did not occur: Safety/medical concerns ? ?  ?   ? ? ?Walk 150 feet activity ? ? ?Assist Walk 150 feet activity did not occur: Safety/medical concerns ? ?  ?  ?  ? ?Walk 10 feet on uneven surface  ?activity ? ? ?Assist Walk 10 feet on uneven surfaces activity did not occur: Safety/medical concerns ? ? ?  ?   ? ?Wheelchair ? ? ? ? ?Assist Is the patient using a wheelchair?: Yes ?Type of Wheelchair: Manual ?Wheelchair activity did not occur: Safety/medical  concerns ? ?  ?   ? ? ?Wheelchair 50 feet with 2 turns activity ? ? ? ?Assist ? ?  ?Wheelchair 50 feet with 2 turns activity did not occur: Safety/medical concerns ? ? ?   ? ?Wheelchair 150 feet activity  ? ? ? ?Assist ? Wheelchair 150 feet activity did not occur: Safety/medical concerns ? ? ?   ? ?Blood pressure (!) 163/61, pulse 63, temperature 97.7 ?F (36.5 ?C), temperature source Oral, resp. rate 14, height '5\' 6"'$  (1.676 m), weight 95.8 kg, SpO2 100 %. ? ?Medical Problem List and Plan: ?1. Functional deficits secondary to acute multifocal infarct including left ischemic thalamus, left parietal cortical and subcortical infarcts ?          -Continue CIR therapies including PT, OT, and SLP. Interdisciplinary team conference today to discuss goals, barriers to discharge, and dc planning.     ? -team providing daily encouragement and motivation ?2.  Antithrombotics: ?-DVT/anticoagulation:  Pharmaceutical: Other (comment) Eliquis ?            -antiplatelet therapy: N/A ?3. Pain/left shoulder girdle pain: continue kpad ? -muscle rub for shoulder girdle has helped ? -provided list of foods that can help decrease pain ?4. Mood: Provide emotional support ?            -antipsychotic agents: N/A ?5. Neuropsych: This patient is not capable of making decisions on her own behalf. ?6. Skin/Wound Care: Routine skin checks ?7. Fluids/Electrolytes/Nutrition: Routine in and outs ?8.  New onset atrial fibrillation with RVR.  Amiodarone 100 mg daily.  Follow-up cardiology services.    ?  HR tightly controlled ?9.  Hypertension.  Norvasc 5 mg daily, Imdur 30 mg daily.    ? Borderline control 3/3, SBP still a little high ? 3/3 -increased imdur to '60mg'$  daily--observe this weekend ? -HCTZ still on hold d/t #11 ? 3/6: increased norvasc to '10mg'$ -->observe 3/7.  ?10.  Suspicion for focal seizure.  Continue Keppra 500 mg twice daily.  EEG not completed due to glued wig could not be removed. ?11.  AKI on CKD stage IIIB- GFR 20.  Marland Kitchen  HCTZ on  hold. ? 3/7 BUN/Cr are gradually improving--likely at baseline now (50/2.8) ?  - off HS IVF ?  -encourage PO's ?  -lokelma x1 for hyperkalemia with improvement to 4.0   ?  -all labs reviewed ?12.  Hyperlipidemia.  Continue Lipitor ?13.  Hypothyroidism.  Synthroid ?14.  Normocytic anemia/monoclonal gammopathy.  Follow-up hematology services Dr.Kale ?15.  Constipation.  MiraLAX twice daily.  Senokot daily. ?16.  Suspect UTI.  Urinalysis negative nitrite.  Empiric Keflex completed. ?17.Type 2 diabetes mellitus.HG  AIC 4.5   SSI discontinued ? ?  Blood glucose of 226 on 2/25. Check periodically ?18. Transaminitis: tylenol d/ced ?19. Leukocytosis:    ?  Resolved, 9.1 3/6 ? ?LOS: ?12 days ?A FACE TO FACE EVALUATION WAS PERFORMED ? ?Meredith Staggers ?05/14/2021, 9:49 AM  ? ?   ?

## 2021-05-14 NOTE — Progress Notes (Signed)
Occupational Therapy Session Note ? ?Patient Details  ?Name: Rita Yoder ?MRN: [id number] ?Date of Birth: [birth year] ? ?Session 1 ?Today's Date: 05/14/2021 ?OT Individual Time: 7619-5093 ?OT Individual Time Calculation (min): 55 min  ? ? ?Session 2 ?Today's Date: 05/14/2021 ?OT Individual Time: 1400-1500 ?OT Individual Time Calculation (min): 60 min  ? ? ?Short Term Goals: ?Week 2:  OT Short Term Goal 1 (Week 2): Pt will transfer to [facility] wiht +1 A and LRAD to decrease BOC ?OT Short Term Goal 2 (Week 2): Pt will complete 1/3 components of toileting with MIN A for balance ?OT Short Term Goal 3 (Week 2): pt will thread 1LE into pants with no  more than CGA for sitting balance ? ?Skilled Therapeutic Interventions/Progress Updates:  ?  Pt supine with no c/o pain, agreeable to OT session focused on ADLs. She came to EOB with min A for trunk elevation, heavy use of bed rails. She was able to sit EOB for 30+ min with no LOB and demonstrated improved ability to reach distally toward her feet during bathing and then bring her trunk safely back to neutral. She completed UB bathing with min cueing for thoroughness on the R side. She continues to reported reduced sensation on the R side. She donned a shirt with min A for motor planning. She completed LB bathing with min A overall, requiring assist for peri hygiene in standing. She required min A to stand from EOB with the RW. She is only able to stand for ~30 seconds at a time and requires several rest breaks seated for LB ADLs. Pants donned with assist to thread the RLE and to pull up in standing. She rested before completing a stand step transfer to the w/c with min A using the RW. Cueing for sequencing turn and RW positioning. Pt completed hair care seated in the w/c with min A. She returned back to bed with min A, improved UE placement. She required mod A to bring her legs back into bed. She was left supine with all needs met, bed alarm set.  ?BP 136/84.  ? ? ?Session 2: ?Pt  received supine with her son Rita Yoder present, no c/o pain and agreeable to OT session. Session focused on visual perceptual skills, motor planning, RUE NMR, and R inattention. Pt completed bed mobility with (S) today to EOB!! She stood with the RW with min A with cueing for hand placement and min A for transfer to the w/c. She was taken to the therapy gym to work on the [place] initially to assess visual scanning and perceptual skills when reaching to a static target in all 4 quadrants. She had very poor awareness of her visual perceptual deficits- requiring max cueing to visually identify target and then know if she had hit the dot or not. Pt then completed more functional reaching/motor planning activity with visual perceptual skills with grasping and moving functional items- I.e cans of soup, PB, jelly. She was able to read the items names but had significant motor apraxia in following two step directions. She also required mod cueing for R attention and scanning. She returned to her room and was left supine with all needs met, bed alarm set. Family present.  ? ? ?Therapy Documentation ?Precautions:  ?Precautions ?Precautions: Fall ?Precaution Comments: Rt hemiparesis and Rt inattention ?Restrictions ?Weight Bearing Restrictions: No ? ?Therapy/Group: Individual Therapy ? ?Curtis Sites ?05/14/2021, 6:26 AM ?

## 2021-05-14 NOTE — Patient Care Conference (Signed)
Inpatient RehabilitationTeam Conference and Plan of Care Update ?Date: 05/14/2021   Time: 10:10 AM  ? ? ?Patient Name: Rita Yoder      ?Medical Record Number: [id number]  ?Date of Birth: [birth year] ?Sex: Female         ?Room/Bed: 8L37D/4K87G-81 ?Payor Info: Payor: [employer] / Plan: [facility] MEDICARE / Product Type: *No Product type* /   ? ?Admit Date/Time:  05/02/2021 12:24 PM ? ?Primary Diagnosis:  Subcortical infarction ([place]) ? ?Hospital Problems: Principal Problem: ?  Subcortical infarction [facility]) ?Active Problems: ?  AKI (acute kidney injury) ([place]) ?  Right-sided visual neglect ?  Hyperglycemia ? ? ? ?Expected Discharge Date: Expected Discharge Date: 05/24/21 ? ?Team Members Present: ?Physician leading conference: Dr. Alger Simons ?Social Worker Present: Loralee Pacas, LCSWA ?Nurse Present: Dorthula Nettles, RN ?PT Present: Apolinar Junes, PT ?OT Present: Laverle Hobby, OT ?SLP Present: Weston Anna, SLP ?PPS Coordinator present : Gunnar Fusi, SLP ? ?   Current Status/Progress Goal Weekly Team Focus  ?Bowel/Bladder ? ? incontinent b/b  regain continence  toileet q 2 hr and prn   ?Swallow/Nutrition/ Hydration ? ?           ?ADL's ? ? Much improved participation, R attention. Mod A overall for ADLs. Transfers have improved to min A!!  min A overall  midline awareness, visual scanning, RUE NMR, transfers, cognitive retraining, ADLs   ?Mobility ? ? Min-mod A bed mobility, transfers with RW, and gait up to 8 ft, much improved participation and motivation  Min A overall, gait 50 ft  R hemi-body attention and NMR, activity tolerance, functional mobility, gait training, w/c mobiltiy, patient/caregiver education   ?Communication ? ? Supervision-Min A  Min A  word-finding strategies   ?Safety/Cognition/ Behavioral Observations ? Mod A  Min A  orientation, functional problem solving, visual scanning to right   ?Pain ? ? 7/10 shoulder pain  < 3  assess pain q 4hr and prn   ?Skin ? ? CDI  no new  breakdown or infection while on CIR  assess skin q shift and prn   ? ? ?Discharge Planning:  ?Return with her dtr [name] who works from home, and pt will continue to have support from all children who will alternate their schedule to make sure her care needs are met.   ?Team Discussion: ?Medically doing well. Labs, kidney function looking better. Reports pain daily, left hip pain. Monitoring BP. Remains incontinent B/B, doesn't report pain to nursing. Will discontinue IV. Has K-pad available for pain. Good family support. Reports unusual sensation in head when BP is elevated.  ? ?Patient on target to meet rehab goals: ?yes, min assist goals. Has really turned a corner this week. Right inattention, motor planning improved. Min assist with RW. Mod assist lower body ADL's. Ambulated 8 ft 2 days ago. Language improving with extra time. Has visual deficits, right visual scanning improving.  ? ?*See Care Plan and progress notes for long and short-term goals.  ? ?Revisions to Treatment Plan:  ?None at this time. ?  ?Teaching Needs: ?Family education, medication/pain management, safety awareness, transfer/gait training, etc.  ?Current Barriers to Discharge: ?Incontinence and pain. ? ?Possible Resolutions to Barriers: ?Family education ?Continue time toileting schedule ?Continue pain management ?  ? ? Medical Summary ?Current Status: still working on bp control. hydration better, off ivf. pain an issue a times but improved ? Barriers to Discharge: Medical stability ?  ?Possible Resolutions to [employer]: daily assessment of labs and patient data.  adjusting bp meds ? ? ?Continued Need for Acute Rehabilitation Level of Care: The patient requires daily medical management by a physician with specialized training in physical medicine and rehabilitation for the following reasons: ?Direction of a multidisciplinary physical rehabilitation program to maximize functional independence : Yes ?Medical management of patient  stability for increased activity during participation in an intensive rehabilitation regime.: Yes ?Analysis of laboratory values and/or radiology reports with any subsequent need for medication adjustment and/or medical intervention. : Yes ? ? ?I attest that I was present, lead the team conference, and concur with the assessment and plan of the team. ? ? ?Dorthula Nettles G ?05/14/2021, 1:18 PM  ? ? ? ? ? ? ?

## 2021-05-14 NOTE — Progress Notes (Signed)
Speech Language Pathology Daily Session Note ? ?Patient Details  ?Name: Rita Yoder ?MRN: [id number] ?Date of Birth: [birth year] ? ?Today's Date: 05/14/2021 ?SLP Individual Time: 1100-1130 ?SLP Individual Time Calculation (min): 30 min ? ?Short Term Goals: ?Week 2: SLP Short Term Goal 1 (Week 2): Patient will scan to right visual field during functional tasks in 50% of opportunities with Mod A multimodal cues. ?SLP Short Term Goal 2 (Week 2): Patient will demonstrate orientation to month, place and situation with Mod A multimodal cues. ?SLP Short Term Goal 3 (Week 2): Patient will initiate functional tasks in 75% of opportunities with Min multimodal cues. ?SLP Short Term Goal 4 (Week 2): Patient will utilize word-finding strategies at the phrase level with Mod A multimodal cues. ?SLP Short Term Goal 5 (Week 2): Patient will express basic wants/needs at the sentence level with Mod A multimodal cues. ? ?Skilled Therapeutic Interventions: Skilled treatment session focused on cognitive-linguistic goals. Patient awake and alert and agreeable to participate in treatment session. Patient continues to report difficulty with vision but difficult to differentiate due to cognitive-linguistic deficits. Patient reports double vision that does not resolve when covering one eye. Patient able to read at the word and phrase level with extra time for visual scanning but also suspect language impacted function. Patient also able to name functional items inconsistently in both her right and left visual fields while SLP standing at midline, again, suspect language may have impacted function. Overall, patient required Min verbal cues for word-finding during structured tasks. Patient left upright in bed with alarm on and all needs within reach. Continue with current plan of care.   ?   ? ?Pain ?Pain Assessment ?Pain Scale: 0-10 ?Pain Score: 0-No pain ?Faces Pain Scale: No hurt ? ?Therapy/Group: Individual Therapy ? ?Rita Yoder,  Rita Yoder ?05/14/2021, 12:01 PM ?

## 2021-05-14 NOTE — Progress Notes (Addendum)
Patient ID: Rita Yoder, female   DOB: 1940/03/12, 81 y.o.   MRN: [id number] ? ?SW met with pt and pt son Izell [place] in room to provide updates from team conference on gains made, and d/c date 3/17. SW provided HHA list ([email or web address]) and asked for further follow-up. SW informed will confirm if family edu is needed since family has been present often. Family intends to meet to discuss further.  ?*SW spoke with pt son Izell [place] to inform family edu needed. Indicated he will follow-up after speaking with his family.  ? ?Loralee Pacas, MSW, LCSWA ?Office: [phone] ?Cell: [phone] ?Fax: [phone]  ?

## 2021-05-15 NOTE — Progress Notes (Signed)
Physical Therapy Session Note ? ?Patient Details  ?Name: Rita Yoder ?MRN: [id number] ?Date of Birth: 04-13-1940 ? ?Today's Date: 05/15/2021 ?PT Individual Time: 0900-1000 ?PT Individual Time Calculation (min): 60 min  ? ?Short Term Goals: ?Week 2:  PT Short Term Goal 1 (Week 2): Patietn will perform bed mobility with min A using hospital bed features >75% of the time. ?PT Short Term Goal 2 (Week 2): Patient will perform basic transfer with min A using LRAD consistently. ?PT Short Term Goal 3 (Week 2): Patient will ambulate >25 feet using LRAD with min A. ? ?Skilled Therapeutic Interventions/Progress Updates:  ?   ?Patient in bed upon PT arrival. Patient alert and agreeable to PT session. Patient reported 4/10 L hip pain during session, RN made aware. PT provided repositioning, rest breaks, and distraction as pain interventions throughout session.  ? ?Participated in tornado drill during session with patient seated in her w/c in the hallway. Discussed disaster preparedness and plans for safety in the event of adverse weather at home. Patient able to come up with an appropriate plan for 3 emergency situations with mod cues during the tornado drill. ? ?Therapeutic Activity: ?Bed Mobility: Patient performed supine to sit with supervision in a flat bed with use of bed rail, she performed sit to supine with min A for lower extremity and trunk management with use of bed rail. Provided verbal cues for sequencing throughout. Discussed placing bed rails on her bed at home, patient stated she would like to do this and will discuss with her family. Will follow-up with family during family education. ?Transfers: Patient performed stand pivot bed>w/c and sit to/from stand x2 from the w/c and x1 from a standard arm chair with min A-CGA using RW. Provided verbal cues for pushing up and reaching back with her L hand. ? ?Gait Training:  ?Patient ambulated 25 feet x2 using RW with min A. Ambulated with decreased gait speed,  decreased step length and height R>L, step-to progressing to step through gait pattern leading with R, veering to the R with RW due to R upper extremity weakness, forward trunk lean, and downward head gaze. Provided verbal cues for erect posture, looking ahead, increased R weight shift, and facilitation for management and steering of RW. ? ?Noted minor signs of skin breakdown to patient's sacrum upon patient report of discomfort in sitting, LPN made aware. Reduced inflation of foam-Roho hybrid, will trial during OT. ? ?Patient in bed at end of session with breaks locked, bed alarm set, and all needs within reach.  ? ?Therapy Documentation ?Precautions:  ?Precautions ?Precautions: Fall ?Precaution Comments: Rt hemiparesis and Rt inattention ?Restrictions ?Weight Bearing Restrictions: No ? ? ? ?Therapy/Group: Individual Therapy ? ?Doreene Burke PT, DPT ? ?05/15/2021, 11:15 AM  ?

## 2021-05-15 NOTE — Progress Notes (Signed)
Patient ID: Alaine Loughney, female   DOB: 1940/12/05, 81 y.o.   MRN: [id number] ? ?SW received message from pt son Izell [place] reporting family will be here for family edu on Tuesday (3/14) 1pm-3pm.  ? ?SW received phone call from pt son Izell [place] reporting preferred [facility] is [facility]. SW sent HHPT/OT/SLP/aide/?SN referral to [facility]. ? ?Loralee Pacas, MSW, LCSWA ?Office: [phone] ?Cell: [phone] ?Fax: [phone]  ?

## 2021-05-15 NOTE — Progress Notes (Signed)
Occupational Therapy Session Note ? ?Patient Details  ?Name: Rita Yoder ?MRN: [id number] ?Date of Birth: 1941-01-09 ? ?Today's Date: 05/15/2021 ?OT Individual Time: 3448-3015 ?OT Individual Time Calculation (min): 30 min  ? ? ?Short Term Goals: ?Week 1:  OT Short Term Goal 1 (Week 1): Pt will complete BSC trasnfer with +2 assist and LRAD ?OT Short Term Goal 1 - Progress (Week 1): Met ?OT Short Term Goal 2 (Week 1): Pt will complete LB dressing at sit<stand level with LRAD ?OT Short Term Goal 2 - Progress (Week 1): Progressing toward goal ?OT Short Term Goal 3 (Week 1): Pt will complete 1 component of UB dressing with Max A ?OT Short Term Goal 3 - Progress (Week 1): Met ? ?Skilled Therapeutic Interventions/Progress Updates:  ?   ?Pt received in bed agreeable to OT needing to toilet ? ?Pt completes functional mobility with RW and CGA for balance but MIN A for steering RW d/t R inattention and praxis deficits. Pt requires HOH A for use fo RUE in toileting tasks for clothing management (MAX A overall) and total A for posterior hygiene. Pt completes 1x40' mobility out of bathroom into hallway and 1x25 feet back to bed. MOD A for BLE into bed at end of session.  ? ?Pt left at end of session in bed with exit alarm on, call light in reach and all needs met ? ? ?Therapy Documentation ?Precautions:  ?Precautions ?Precautions: Fall ?Precaution Comments: Rt hemiparesis and Rt inattention ?Restrictions ?Weight Bearing Restrictions: No ?General: ?  ? ? ?Therapy/Group: Individual Therapy ? ?Lowella Dell Yalda Herd ?05/15/2021, 6:51 AM ?

## 2021-05-15 NOTE — Progress Notes (Signed)
?                                                       PROGRESS NOTE ? ? ?Subjective/Complaints: ?Pt up in bed. No new issues. Still reports that her vision is off. She recalled our discussion yesterday about her vision ? ?ROS: Patient denies fever, rash, sore throat, blurred vision, dizziness, nausea, vomiting, diarrhea, cough, shortness of breath or chest pain,  neck pain, headache, or mood change.  ? ?Objective: ?  ?No results found. ?Recent Labs  ?  05/13/21 ?2409  ?WBC 9.1  ?HGB 9.2*  ?HCT 28.8*  ?PLT 310  ? ? ?Recent Labs  ?  05/13/21 ?7353  ?NA 139  ?K 4.2  ?CL 112*  ?CO2 18*  ?GLUCOSE 88  ?BUN 51*  ?CREATININE 2.81*  ?CALCIUM 9.0  ? ? ?Intake/Output Summary (Last 24 hours) at 05/15/2021 0839 ?Last data filed at 05/14/2021 1700 ?Gross per 24 hour  ?Intake 268 ml  ?Output --  ?Net 268 ml  ?  ? ?  ? ?Physical Exam: ?Vital Signs ?Blood pressure (!) 143/62, pulse (!) 55, temperature 98 ?F (36.7 ?C), temperature source Oral, resp. rate 14, height '5\' 6"'$  (1.676 m), weight 95.8 kg, SpO2 100 %. ?General: No acute distress, obese ?HEENT: NCAT, EOMI, oral membranes moist ?Cards: reg rate  ?Chest: normal effort ?Abdomen: Soft, NT, ND ?Skin: dry, intact ?Extremities: no edema ?Psych: pleasant and appropriate  ?Skin: intact ? ?Musculoskeletal:  ?   moving all 4 limbs. Does not appear to be in pain this am ?Neuro:  ?Very Alert and Oriented to person, place, stroke. Right central 7, speech clear..   RUE 4- to 5/5. RLE 3-4/5 LUE/LLE 4+/5. Decreased light touch on right. Better right sided attention and engagement of right side.  Decreased LT, hypersensitivity in stocking glove distribution below knees bilaterally. DTR's 1+ ?  ?  ?Assessment/Plan: ?1. Functional deficits which require 3+ hours per day of interdisciplinary therapy in a comprehensive inpatient rehab setting. ?Physiatrist is providing close team supervision and 24 hour management of active medical problems listed below. ?Physiatrist and rehab team continue to  assess barriers to discharge/monitor patient progress toward functional and medical goals ? ?Care Tool: ? ?Bathing ?   ?   ? Body parts bathed by helper: Right arm, Left arm, Chest, Abdomen, Front perineal area, Buttocks, Right upper leg, Left upper leg, Left lower leg, Right lower leg, Face ?  ?  ?Bathing assist Assist Level: 2 Helpers ?  ?  ?Upper Body Dressing/Undressing ?Upper body dressing   ?What is the patient wearing?: [place] only ?   ?Upper body assist Assist Level: 2 Helpers ?   ?Lower Body Dressing/Undressing ?Lower body dressing ? ? ?   ?What is the patient wearing?: Incontinence brief ? ?  ? ?Lower body assist Assist for lower body dressing: 2 Helpers ?   ? ?Toileting ?Toileting    ?Toileting assist Assist for toileting: 2 Helpers ?  ?  ?Transfers ?Chair/bed transfer ? ?Transfers assist ? Chair/bed transfer activity did not occur: Safety/medical concerns ? ?Chair/bed transfer assist level: Minimal Assistance - Patient > 75% ?Chair/bed transfer assistive device: Walker ?  ?Locomotion ?Ambulation ? ? ?Ambulation assist ? ? Ambulation activity did not occur: Safety/medical concerns ? ?Assist level: Minimal Assistance - Patient > 75% ?Assistive device:  Walker-rolling ?Max distance: 8 ft  ? ?Walk 10 feet activity ? ? ?Assist ? Walk 10 feet activity did not occur: Safety/medical concerns ? ?  ?   ? ?Walk 50 feet activity ? ? ?Assist Walk 50 feet with 2 turns activity did not occur: Safety/medical concerns ? ?  ?   ? ? ?Walk 150 feet activity ? ? ?Assist Walk 150 feet activity did not occur: Safety/medical concerns ? ?  ?  ?  ? ?Walk 10 feet on uneven surface  ?activity ? ? ?Assist Walk 10 feet on uneven surfaces activity did not occur: Safety/medical concerns ? ? ?  ?   ? ?Wheelchair ? ? ? ? ?Assist Is the patient using a wheelchair?: Yes ?Type of Wheelchair: Manual ?Wheelchair activity did not occur: Safety/medical concerns ? ?  ?   ? ? ?Wheelchair 50 feet with 2 turns activity ? ? ? ?Assist ? ?   ?Wheelchair 50 feet with 2 turns activity did not occur: Safety/medical concerns ? ? ?   ? ?Wheelchair 150 feet activity  ? ? ? ?Assist ? Wheelchair 150 feet activity did not occur: Safety/medical concerns ? ? ?   ? ?Blood pressure (!) 143/62, pulse (!) 55, temperature 98 ?F (36.7 ?C), temperature source Oral, resp. rate 14, height '5\' 6"'$  (1.676 m), weight 95.8 kg, SpO2 100 %. ? ?Medical Problem List and Plan: ?1. Functional deficits secondary to acute multifocal infarct including left ischemic thalamus, left parietal cortical and subcortical infarcts ?          --Continue CIR therapies including PT, OT, and SLP  ? -pt progressing functionally! ?2.  Antithrombotics: ?-DVT/anticoagulation:  Pharmaceutical: Other (comment) Eliquis ?            -antiplatelet therapy: N/A ?3. Pain/left shoulder girdle pain: continue kpad ? -muscle rub for shoulder girdle has helped ? -provided list of foods that can help decrease pain ?4. Mood: Provide emotional support ?            -antipsychotic agents: N/A ?5. Neuropsych: This patient is not capable of making decisions on her own behalf. ?6. Skin/Wound Care: Routine skin checks ?7. Fluids/Electrolytes/Nutrition: Routine in and outs ?8.  New onset atrial fibrillation with RVR.  Amiodarone 100 mg daily.  Follow-up cardiology services.    ?  HR tightly controlled ?9.  Hypertension.  Norvasc 5 mg daily, Imdur 30 mg daily.    ? Borderline control 3/3, SBP still a little high ? 3/3 -increased imdur to '60mg'$  daily--observe this weekend ? -HCTZ still on hold d/t #11 ? 3/8: improved control with norvasc to '10mg'$   ?10.  Suspicion for focal seizure.  Continue Keppra 500 mg twice daily.  EEG not completed due to glued wig could not be removed. ?11.  AKI on CKD stage IIIB- GFR 20.  Marland Kitchen  HCTZ on hold. ? 3/7 BUN/Cr   gradually improving--likely at baseline now (50/2.8) ?  - off HS IVF ?  -encourage PO's ?  -lokelma x1 for hyperkalemia with improvement to 4.0   ?    ?12.  Hyperlipidemia.  Continue  Lipitor ?13.  Hypothyroidism.  Synthroid ?14.  Normocytic anemia/monoclonal gammopathy.  Follow-up hematology services Dr.Kale ?15.  Constipation.  MiraLAX twice daily.  Senokot daily. ?16.  Suspect UTI.  Urinalysis negative nitrite.  Empiric Keflex completed. ?17.Type 2 diabetes mellitus.HG  AIC 4.5   SSI discontinued ? ?  Blood glucose of 226 on 2/25. Check periodically ?18. Transaminitis: tylenol d/ced ?19. Leukocytosis:    ?  Resolved, 9.1 3/6 ? ?LOS: ?13 days ?A FACE TO FACE EVALUATION WAS PERFORMED ? ?Meredith Staggers ?05/15/2021, 8:39 AM  ? ?   ?

## 2021-05-15 NOTE — Progress Notes (Signed)
Speech Language Pathology Daily Session Note ? ?Patient Details  ?Name: Rita Yoder ?MRN: [id number] ?Date of Birth: [birth year] ? ?Today's Date: 05/15/2021 ?SLP Individual Time: 3154-0086 ?SLP Individual Time Calculation (min): 45 min ? ?Short Term Goals: ?Week 2: SLP Short Term Goal 1 (Week 2): Patient will scan to right visual field during functional tasks in 50% of opportunities with Mod A multimodal cues. ?SLP Short Term Goal 2 (Week 2): Patient will demonstrate orientation to month, place and situation with Mod A multimodal cues. ?SLP Short Term Goal 3 (Week 2): Patient will initiate functional tasks in 75% of opportunities with Min multimodal cues. ?SLP Short Term Goal 4 (Week 2): Patient will utilize word-finding strategies at the phrase level with Mod A multimodal cues. ?SLP Short Term Goal 5 (Week 2): Patient will express basic wants/needs at the sentence level with Mod A multimodal cues. ? ?Skilled Therapeutic Interventions: Skilled treatment session focused on cognitive goals. SLP attempted to administer the [facility] Mental Status Examination (SLUMS). However, the visual portions were not administered due to ongoing visual disturbances. A true score was unable to be obtained but patient scored 12/24 points with deficits in orientation, short-term recall, calculations and generative naming. Patient's son present and confirmed that patient was completely independent at baseline and managed all aspects of the household. SLP educated that since patient's overall verbal expression has improved so significantly, the focus of skilled SLP intervention will now shift mainly towards addressing cognitive deficits. Both verbalized understanding and agreement. Patient left upright in bed with alarm on and all needs within reach. Continue with current plan of care.  ?   ? ?Pain ?No/Denies Pain  ? ?Therapy/Group: Individual Therapy ? ?Leroi Haque ?05/15/2021, 3:16 PM ?

## 2021-05-15 NOTE — Progress Notes (Signed)
Occupational Therapy Session Note ? ?Patient Details  ?Name: Rita Yoder ?MRN: [id number] ?Date of Birth: 10/14/1940 ? ?Today's Date: 05/15/2021 ?OT Individual Time: 1103-1200 ?OT Individual Time Calculation (min): 57 min  ? ? ?Short Term Goals: ?Week 2:  OT Short Term Goal 1 (Week 2): Pt will transfer to [facility] wiht +1 A and LRAD to decrease BOC ?OT Short Term Goal 2 (Week 2): Pt will complete 1/3 components of toileting with MIN A for balance ?OT Short Term Goal 3 (Week 2): pt will thread 1LE into pants with no  more than CGA for sitting balance ? ?Skilled Therapeutic Interventions/Progress Updates:  ?  Pt received supine with no c/o pain, she was agreeable to OT session. Pt completed bed mobility with min A d/t poor motor planning. Min A for ambulatory transfer to the shower. She required mod cueing to sequence transfer to the TTB in the shower. Mod A for removing LB clothing sit > stand. Pt completed UB bathing with cueing for thoroughness and R attention but overall (S). She was able to reach distally to her feet from seated and required min A for thorough peri care. She completed an ambulatory transfer back to the w/c. Max A for LB dressing. Min A for UB dressing d/t motor planning deficits. Pt then transferred to the bed and required mod A to lift her BLE. From supine pt completed visual perceptual training with functional reaching into the R field of vision, requiring max cueing for appropriate color identification and scanning. Pt was left supine with all needs met, bed alarm set.  ? ?Therapy Documentation ?Precautions:  ?Precautions ?Precautions: Fall ?Precaution Comments: Rt hemiparesis and Rt inattention ?Restrictions ?Weight Bearing Restrictions: No ? ?Therapy/Group: Individual Therapy ? ?Curtis Sites ?05/15/2021, 6:24 AM ?

## 2021-05-16 NOTE — Progress Notes (Signed)
Physical Therapy Session Note ? ?Patient Details  ?Name: Rita Yoder ?MRN: [id number] ?Date of Birth: 15-Apr-1940 ? ?Today's Date: 05/16/2021 ?PT Individual Time: 8841-6606 ?PT Individual Time Calculation (min): 45 min  ? ?Short Term Goals: ?Week 2:  PT Short Term Goal 1 (Week 2): Patietn will perform bed mobility with min A using hospital bed features >75% of the time. ?PT Short Term Goal 2 (Week 2): Patient will perform basic transfer with min A using LRAD consistently. ?PT Short Term Goal 3 (Week 2): Patient will ambulate >25 feet using LRAD with min A. ? ?Skilled Therapeutic Interventions/Progress Updates:  ?   ?Patient in bed with her eyes closed upon PT arrival. Patient easily aroused and agreeable to PT session, reports and appears significantly fatigue this afternoon. Patient reported 7/10 L hip pain during session, RN made aware. PT provided repositioning, rest breaks, and distraction as pain interventions throughout session.  ? ?Remained bed level due to fatigue and pain levels this afternoon. Assessed L hip pain with increased pain with hip IR and Adduction, attempted hip joint mobilization grade 1-2 for pain management and soft tissue mobilization without good effect for pain management. Focused on R hemi-body NMR, performed R hip/knee flexion/extension with manual resistance, R SLR with facilitation to maintain knee extension, R grasp and release with full finger extension, R elbow flexion/extension to external target, and R shoulder flexion and abduction PROM for improved shoulder mobility to 90 deg, all performed 2x10 reps. ? ?Patient with questions about fatigue and fluctuations with aphasia following her stroke. Educated on impact of fatigue on deficits and causes for fluctuation. Reviewed types and locations of multifocal infarcts and physiology and risk factors associated with ischemic disease. Discussed d/c planning and factors to promote health and mobility at home. Patient very motivated to  continue working in rehab to regain as much independence as possible and discouraged by her inability to participate in OOB mobility this afternoon. Reinforced education on the impact of fatigue following stroke and fluctuations in performance in recovery and encouraged patient by discussing her progress with mobility since admission. Patient very appreciative of education and discussion. Patient closing her eyes intermittently and reports, "I think I need a nap." Patient missed 15 min of skilled PT due to fatigue, RN made aware. Will attempt to make-up missed time as able.   ? ?Patient in bed at end of session with breaks locked, bed alarm set, and all needs within reach.  ? ?Therapy Documentation ?Precautions:  ?Precautions ?Precautions: Fall ?Precaution Comments: Rt hemiparesis and Rt inattention ?Restrictions ?Weight Bearing Restrictions: No ? ? ? ?Therapy/Group: Individual Therapy ? ?Doreene Burke PT, DPT ? ?05/16/2021, 4:42 PM  ?

## 2021-05-16 NOTE — Progress Notes (Signed)
Occupational Therapy Weekly Progress Note ? ?Patient Details  ?Name: Rita Yoder ?MRN: [id number] ?Date of Birth: 04-10-1940 ? ?Beginning of progress report period: May 10, 2021 ?End of progress report period: May 16, 2021 ? ?Today's Date: 05/16/2021 ?OT Individual Time: 3016-0109 ?OT Individual Time Calculation (min): 55 min  ? ? ?Patient has met 2 of 3 short term goals.  Pt continues to make significnat progress in mobility, ADL performance and praxis improving overall ADL performance to MIN-MOD A with CGA for functional transfers. Pt continues to demo decreased R attention and impaired motor planning/apraxia impacting her ADLs. Family continues to remain present and supportive throughout sessions.  ? ?Patient continues to demonstrate the following deficits: muscle weakness, decreased cardiorespiratoy endurance, impaired timing and sequencing, unbalanced muscle activation, motor apraxia, decreased coordination, and decreased motor planning, decreased attention to right, decreased awareness, decreased problem solving, decreased safety awareness, decreased memory, and delayed processing, and decreased sitting balance, decreased standing balance, decreased postural control, hemiplegia, and decreased balance strategies and therefore will continue to benefit from skilled OT intervention to enhance overall performance with BADL, iADL, and Reduce care partner burden. ? ?Patient progressing toward long term goals..  Continue plan of care. ? ?OT Short Term Goals ?Week 2:  OT Short Term Goal 1 (Week 2): Pt will transfer to [facility] wiht +1 A and LRAD to decrease BOC ?OT Short Term Goal 1 - Progress (Week 2): Met ?OT Short Term Goal 2 (Week 2): Pt will complete 1/3 components of toileting with MIN A for balance ?OT Short Term Goal 2 - Progress (Week 2): Progressing toward goal ?OT Short Term Goal 3 (Week 2): pt will thread 1LE into pants with no  more than CGA for sitting balance ?OT Short Term Goal 3 - Progress (Week 2):  Met ?Week 3:  OT Short Term Goal 1 (Week 3): STG=LTG d/t ELOS ? ?Skilled Therapeutic Interventions/Progress Updates:  ?   ?Pt received in bed with no pain.   ?ADL: ?Pt sup>sit EOB with use of bed rail and HOB elevated slightly. Pt completes LB BADL with A for posterior hygiene and A to get to feet. Pt able ot complete 2/3 components of LB dressing at sit to stand level with increased time to motor plan and CGA for sit to stand with RW. Pt and granddaughter go to ADL apartment to look at tub shower. Edu re DME recommendations as well as sequencing of TTB transfer provided. Pt eventually agreeable with encouragement to complete TTB transfer with MOD A for BLE management into and out of tub. CGA for ambulation and sit ot stnad ? ?Pt left at end of session in bed with exit alarm on, call light in reach and all needs met ? ? ?Therapy Documentation ?Precautions:  ?Precautions ?Precautions: Fall ?Precaution Comments: Rt hemiparesis and Rt inattention ?Restrictions ?Weight Bearing Restrictions: No ?General: ?  ? ? ?Therapy/Group: Individual Therapy ? ?Lowella Dell Ramar Nobrega ?05/16/2021, 6:49 AM  ?

## 2021-05-16 NOTE — Progress Notes (Signed)
Speech Language Pathology Daily Session Note ? ?Patient Details  ?Name: Rita Yoder ?MRN: [id number] ?Date of Birth: Sep 11, 1940 ? ?Today's Date: 05/16/2021 ?SLP Individual Time: 7169-6789 ?SLP Individual Time Calculation (min): 45 min ? ?Short Term Goals: ?Week 2: SLP Short Term Goal 1 (Week 2): Patient will scan to right visual field during functional tasks in 50% of opportunities with Mod A multimodal cues. ?SLP Short Term Goal 2 (Week 2): Patient will demonstrate orientation to month, place and situation with Mod A multimodal cues. ?SLP Short Term Goal 3 (Week 2): Patient will initiate functional tasks in 75% of opportunities with Min multimodal cues. ?SLP Short Term Goal 4 (Week 2): Patient will utilize word-finding strategies at the phrase level with Mod A multimodal cues. ?SLP Short Term Goal 5 (Week 2): Patient will express basic wants/needs at the sentence level with Mod A multimodal cues. ? ?Skilled Therapeutic Interventions: Skilled treatment session focused on cognitive goals. Upon arrival, patient was awake and alert and agreeable to treatment session. SLP facilitated session by providing extra time and Mod verbal cues for visual scanning as well as bright contrasting colors for sorting change from a field of 4. SLP also provided Max A multimodal cues for patient to generate specific amounts of change. Patient with appropriate awareness regarding difficulty of task, SLP provided support and education. Patient left upright in bed with alarm on and all needs within reach. Continue with current plan of care.  ?   ? ?Pain ?Pain Assessment ?Pain Scale: 0-10 ?Pain Score: 0-No pain ? ?Therapy/Group: Individual Therapy ? ?Tamesha Ellerbrock ?05/16/2021, 12:30 PM ?

## 2021-05-16 NOTE — Progress Notes (Signed)
Patient ID: Rita Yoder, female   DOB: 03-21-1940, 81 y.o.   MRN: [id number] ? ?SW waiting on follow-up about HHPT/OT/SLP/aide/?SN referral to [facility]. ? ?*SW received message from pt son Rita Yoder reporting additional HHA preferences: [facility], [facility], and [place].  ? ?SW sent [facility] referral to [name] and waiting on follow-up.  ?*referral excepted. ? ?Loralee Pacas, MSW, LCSWA ?Office: [phone] ?Cell: [phone] ?Fax: [phone]  ?

## 2021-05-16 NOTE — Progress Notes (Signed)
?                                                       PROGRESS NOTE ? ? ?Subjective/Complaints: ?Up in bed. SLP in room. Pt continues to make functional gains. Says she's gonna walk out of here! ? ?ROS: Patient denies fever, rash, sore throat, blurred vision, dizziness, nausea, vomiting, diarrhea, cough, shortness of breath or chest pain, joint or back/neck pain, headache, or mood change.  ? ?Objective: ?  ?No results found. ?No results for input(s): WBC, HGB, HCT, PLT in the last 72 hours. ? ? ?No results for input(s): NA, K, CL, CO2, GLUCOSE, BUN, CREATININE, CALCIUM in the last 72 hours. ? ? ?Intake/Output Summary (Last 24 hours) at 05/16/2021 0906 ?Last data filed at 05/15/2021 1845 ?Gross per 24 hour  ?Intake 295 ml  ?Output --  ?Net 295 ml  ?  ? ?  ? ?Physical Exam: ?Vital Signs ?Blood pressure (!) 161/62, pulse (!) 52, temperature 97.7 ?F (36.5 ?C), resp. rate 16, height '5\' 6"'$  (1.676 m), weight 95.8 kg, SpO2 100 %. ?Constitutional: No distress . Vital signs reviewed. obese ?HEENT: NCAT, EOMI, oral membranes moist ?Neck: supple ?Cardiovascular: RRR without murmur. No JVD    ?Respiratory/Chest: CTA Bilaterally without wheezes or rales. Normal effort    ?GI/Abdomen: BS +, non-tender, non-distended ?Ext: no clubbing, cyanosis, or edema ?Psych: pleasant and cooperative  ?Skin: intact ? ?Musculoskeletal:  ?   moving all 4 limbs. Does not appear to be in pain this am ?Neuro:  ?Very Alert and Oriented to person, place, stroke. Right central 7, speech clear..   RUE 4- to 5/5. RLE 3-4/5 LUE/LLE 4+/5. Decreased light touch on right. Better right sided attention and engagement of right side but still needs cueing at times. .  Decreased LT,  stocking glove sensory loss  below knees bilaterally. DTR's 1+ ?  ?  ?Assessment/Plan: ?1. Functional deficits which require 3+ hours per day of interdisciplinary therapy in a comprehensive inpatient rehab setting. ?Physiatrist is providing close team supervision and 24 hour management  of active medical problems listed below. ?Physiatrist and rehab team continue to assess barriers to discharge/monitor patient progress toward functional and medical goals ? ?Care Tool: ? ?Bathing ?   ?   ? Body parts bathed by helper: Right arm, Left arm, Chest, Abdomen, Front perineal area, Buttocks, Right upper leg, Left upper leg, Left lower leg, Right lower leg, Face ?  ?  ?Bathing assist Assist Level: 2 Helpers ?  ?  ?Upper Body Dressing/Undressing ?Upper body dressing   ?What is the patient wearing?: [place] only ?   ?Upper body assist Assist Level: 2 Helpers ?   ?Lower Body Dressing/Undressing ?Lower body dressing ? ? ?   ?What is the patient wearing?: Incontinence brief ? ?  ? ?Lower body assist Assist for lower body dressing: 2 Helpers ?   ? ?Toileting ?Toileting    ?Toileting assist Assist for toileting: 2 Helpers ?  ?  ?Transfers ?Chair/bed transfer ? ?Transfers assist ? Chair/bed transfer activity did not occur: Safety/medical concerns ? ?Chair/bed transfer assist level: Minimal Assistance - Patient > 75% ?Chair/bed transfer assistive device: Walker ?  ?Locomotion ?Ambulation ? ? ?Ambulation assist ? ? Ambulation activity did not occur: Safety/medical concerns ? ?Assist level: Minimal Assistance - Patient > 75% ?Assistive  device: Walker-rolling ?Max distance: 25 ft  ? ?Walk 10 feet activity ? ? ?Assist ? Walk 10 feet activity did not occur: Safety/medical concerns ? ?Assist level: Minimal Assistance - Patient > 75% ?Assistive device: Walker-rolling  ? ?Walk 50 feet activity ? ? ?Assist Walk 50 feet with 2 turns activity did not occur: Safety/medical concerns ? ?  ?   ? ? ?Walk 150 feet activity ? ? ?Assist Walk 150 feet activity did not occur: Safety/medical concerns ? ?  ?  ?  ? ?Walk 10 feet on uneven surface  ?activity ? ? ?Assist Walk 10 feet on uneven surfaces activity did not occur: Safety/medical concerns ? ? ?  ?   ? ?Wheelchair ? ? ? ? ?Assist Is the patient using a wheelchair?: Yes ?Type  of Wheelchair: Manual ?Wheelchair activity did not occur: Safety/medical concerns ? ?  ?   ? ? ?Wheelchair 50 feet with 2 turns activity ? ? ? ?Assist ? ?  ?Wheelchair 50 feet with 2 turns activity did not occur: Safety/medical concerns ? ? ?   ? ?Wheelchair 150 feet activity  ? ? ? ?Assist ? Wheelchair 150 feet activity did not occur: Safety/medical concerns ? ? ?   ? ?Blood pressure (!) 161/62, pulse (!) 52, temperature 97.7 ?F (36.5 ?C), resp. rate 16, height '5\' 6"'$  (1.676 m), weight 95.8 kg, SpO2 100 %. ? ?Medical Problem List and Plan: ?1. Functional deficits secondary to acute multifocal infarct including left ischemic thalamus, left parietal cortical and subcortical infarcts ?         -Continue CIR therapies including PT, OT, and SLP  ? -pt progressing functionally! ?2.  Antithrombotics: ?-DVT/anticoagulation:  Pharmaceutical: Other (comment) Eliquis ?            -antiplatelet therapy: N/A ?3. Pain/left shoulder girdle pain: continue kpad ? -muscle rub for shoulder girdle has helped ?   ?4. Mood: Provide emotional support ?            -antipsychotic agents: N/A ?5. Neuropsych: This patient is not capable of making decisions on her own behalf. ?6. Skin/Wound Care: Routine skin checks ?7. Fluids/Electrolytes/Nutrition: Routine in and outs ?8.  New onset atrial fibrillation with RVR.  Amiodarone 100 mg daily.  Follow-up cardiology services.    ?  HR tightly controlled ?9.  Hypertension.  Norvasc 5 mg daily, Imdur 30 mg daily.    ? Borderline control 3/3, SBP still a little high ? 3/3 -increased imdur to '60mg'$  daily--observe this weekend ? -HCTZ still on hold d/t #11 ? 3/9: fair control with norvasc to '10mg'$   ?10.  Suspicion for focal seizure.  Continue Keppra 500 mg twice daily.  EEG not completed due to glued wig could not be removed. ?11.  AKI on CKD stage IIIB- GFR 20.  Marland Kitchen  HCTZ on hold. ? 3/9 BUN/Cr   gradually improving--likely at baseline now (50/2.8) ?  - off HS IVF ?  -encourage PO's ?  -repeat labs  Monday ?12.  Hyperlipidemia.  Continue Lipitor ?13.  Hypothyroidism.  Synthroid ?14.  Normocytic anemia/monoclonal gammopathy.  Follow-up hematology services Dr.Kale ?15.  Constipation.  MiraLAX twice daily.  Senokot daily. ?16.  Suspect UTI.  Urinalysis negative nitrite.  Empiric Keflex completed. ?17.Type 2 diabetes mellitus.HG  AIC 4.5   SSI discontinued ? ?  Blood glucose of 226 on 2/25. Check periodically ?18. Transaminitis: tylenol d/ced ?19. Leukocytosis:    ?  Resolved, 9.1 3/6 ? ?LOS: ?14 days ?A FACE TO FACE EVALUATION  WAS PERFORMED ? ?Meredith Staggers ?05/16/2021, 9:06 AM  ? ?   ?

## 2021-05-17 ENCOUNTER — Encounter: Payer: Self-pay | Admitting: Internal Medicine

## 2021-05-17 NOTE — Progress Notes (Signed)
Occupational Therapy Session Note ? ?Patient Details  ?Name: Rita Yoder ?MRN: [id number] ?Date of Birth: 08/30/1940 ? ?Today's Date: 05/17/2021 ?OT Individual Time: 6701-4103 ?OT Individual Time Calculation (min): 40 min  ? ? ?Short Term Goals: ?Week 3:  OT Short Term Goal 1 (Week 3): STG=LTG d/t ELOS ? ?Skilled Therapeutic Interventions/Progress Updates:  ?  Pt received standing over BSC with NT assisting toileting tasks. Assisted in donning new brief- pt able to maintain static standing with close (S) with BUE support on the RW. She transferred back to the EOB before agreeing to get back to w/c for session out of room. She completed ambulatory transfers (<5 ft) with CGA using the RW. Pt was taken to the ADL apt. She completed standing level silverware sorting task at the counter to work on [place], problem solving, selective attention, and R attention/visual scanning. Pt required mod cueing overall, often with poor error recognition, mental flexibility, and sequencing overall. Pt was able to stand with close (S) for 5 minutes, leaning on counter as she became progressively more fatigued. She then completed a visual perception and motor planning based grasping functional items. Pt required mod-max cueing overall. Pt returned to her room and was left supine with all needs met, bed alarm set.  ? ?Therapy Documentation ?Precautions:  ?Precautions ?Precautions: Fall ?Precaution Comments: Rt hemiparesis and Rt inattention ?Restrictions ?Weight Bearing Restrictions: No ? ?Therapy/Group: Individual Therapy ? ?Curtis Sites ?05/17/2021, 6:25 AM ?

## 2021-05-17 NOTE — Progress Notes (Signed)
Occupational Therapy Session Note ? ?Patient Details  ?Name: Rita Yoder ?MRN: [id number] ?Date of Birth: 1940-06-16 ? ?Today's Date: 05/17/2021 ?OT Individual Time: 5732-2025 ?OT Individual Time Calculation (min): 23 min  ? ? ?Short Term Goals: ?Week 3:  OT Short Term Goal 1 (Week 3): STG=LTG d/t ELOS ? ?Skilled Therapeutic Interventions/Progress Updates:  ?Skilled OT intervention completed with focus on activity tolerance, RUE NMR. Pt received upright in bed, initially declining therapy however agreeable to bed level activity. Pt participated in lego block building task to promote functional use of R and scanning to R side, with pt demonstrating fluctuating difficulty with retrieving items, and motor planning during placement. Hand over hand utilized frequently to encourage pt participation, as pt with limited initiation on R side despite max verbal cues. Pt's accuracy with selecting correct color blocks with downgraded task due to difficulty level, improved upon contrast of blocks on white bed sheet vs brown table. Pt was able to report 5/5 colors correctly, and mod A needed for sequencing the building. Required increased time. Pt was left upright in bed, with bed alarm on and all needs in reach at end of session. ?  ? ?Therapy Documentation ?Precautions:  ?Precautions ?Precautions: Fall ?Precaution Comments: Rt hemiparesis and Rt inattention ?Restrictions ?Weight Bearing Restrictions: No ? ?Pain: ?No c/o pain ? ? ?Therapy/Group: Individual Therapy ? ?Yaneth Fairbairn E Mont Jagoda ?05/17/2021, 7:49 AM ?

## 2021-05-17 NOTE — Progress Notes (Signed)
Speech Language Pathology Weekly Progress and Session Note ? ?Patient Details  ?Name: Rita Yoder ?MRN: [id number] ?Date of Birth: March 03, 1941 ? ?Beginning of progress report period: May 10, 2021 ?End of progress report period: May 17, 2021 ? ?Today's Date: 05/17/2021 ?SLP Individual Time: 2353-6144 ?SLP Individual Time Calculation (min): 55 min ? ?Short Term Goals: ?Week 2: SLP Short Term Goal 1 (Week 2): Patient will scan to right visual field during functional tasks in 50% of opportunities with Mod A multimodal cues. ?SLP Short Term Goal 1 - Progress (Week 2): Met ?SLP Short Term Goal 2 (Week 2): Patient will demonstrate orientation to month, place and situation with Mod A multimodal cues. ?SLP Short Term Goal 2 - Progress (Week 2): Met ?SLP Short Term Goal 3 (Week 2): Patient will initiate functional tasks in 75% of opportunities with Min multimodal cues. ?SLP Short Term Goal 3 - Progress (Week 2): Met ?SLP Short Term Goal 4 (Week 2): Patient will utilize word-finding strategies at the phrase level with Mod A multimodal cues. ?SLP Short Term Goal 4 - Progress (Week 2): Met ?SLP Short Term Goal 5 (Week 2): Patient will express basic wants/needs at the sentence level with Mod A multimodal cues. ?SLP Short Term Goal 5 - Progress (Week 2): Met ? ?  ?New Short Term Goals: ?Week 3: SLP Short Term Goal 1 (Week 3): STGs=LTGs due to ELOS ? ?Weekly Progress Updates: Patient has made functional gains and has met 5 of 5 STGs this reporting period. Currently, patient can verbally express basic wants/needs at the phrase and sentence level with extra time and overall Min verbal cues for word-finding. Patient also demonstrates improved cognitive functioning and requires Min verbal cues for orientation and sustained attention, Mod verbal cues for visual scanning/attention to right field of environment and Max verbal cues for functional problem solving and emergent awareness. Patient and family education ongoing. Patient  would benefit from continued skilled SLP intervention to maximize her cognitive functioning and overall functional independence prior to discharge.  ?  ? ? ?Intensity: Minumum of 1-2 x/day, 30 to 90 minutes ?Frequency: 3 to 5 out of 7 days ?Duration/Length of Stay: 05/24/21 ?Treatment/Interventions: Cognitive remediation/compensation;Internal/external aids;Speech/Language facilitation;Therapeutic Activities;Environmental controls;Cueing hierarchy;Functional tasks;Patient/family education ? ? ?Daily Session ? ?Skilled Therapeutic Interventions:  Skilled treatment session focused on cognitive goals. SLP facilitated session by providing a calendar task to work on visual scanning and basic problem solving. Patient could identify numbers individually but required Max verbal and visual cues with SLP minimizing visual field and distractions to scan and place the number in the appropriate sequence. Max verbal cues were also needed for problem solving throughout task. Despite difficulty with task, patient maintained sustained attention for ~45 minutes with Min verbal cues for redirection. Patient left upright in bed with alarm on and all needs within reach. Continue with current plan of care.    ? ? ?Pain ?No/Denies Pain  ? ?Therapy/Group: Individual Therapy ? ?Rita Yoder ?05/17/2021, 6:31 AM ? ? ? ? ? ? ?

## 2021-05-17 NOTE — Progress Notes (Signed)
Patient ID: Rita Yoder, female   DOB: 1940-12-12, 81 y.o.   MRN: [id number] ? ?SW sent HH order to [name].  ? ?SW spoke with pt son [name] [phone]) to provide updates on HHA and information for branch will be in discharge instructions. SW will f/u with remainder DME recs once aware.  ? ?Loralee Pacas, MSW, LCSWA ?Office: [phone] ?Cell: [phone] ?Fax: [phone]  ?

## 2021-05-17 NOTE — Progress Notes (Signed)
?                                                       PROGRESS NOTE ? ? ?Subjective/Complaints: ?Pt working with SLP on memory/organizational tasks. Pt denies any new problems ? ?ROS: Patient denies fever, rash, sore throat, blurred vision, dizziness, nausea, vomiting, diarrhea, cough, shortness of breath or chest pain, joint or back/neck pain, headache, or mood change.  ? ?Objective: ?  ?No results found. ?No results for input(s): WBC, HGB, HCT, PLT in the last 72 hours. ? ? ?No results for input(s): NA, K, CL, CO2, GLUCOSE, BUN, CREATININE, CALCIUM in the last 72 hours. ? ? ?Intake/Output Summary (Last 24 hours) at 05/17/2021 0929 ?Last data filed at 05/17/2021 0740 ?Gross per 24 hour  ?Intake 300 ml  ?Output --  ?Net 300 ml  ?  ? ?  ? ?Physical Exam: ?Vital Signs ?Blood pressure (!) 131/51, pulse (!) 57, temperature 98.3 ?F (36.8 ?C), temperature source Oral, resp. rate 18, height '5\' 6"'$  (1.676 m), weight 95.8 kg, SpO2 100 %. ?Constitutional: No distress . Vital signs reviewed. ?HEENT: NCAT, EOMI, oral membranes moist ?Neck: supple ?Cardiovascular: RRR without murmur. No JVD    ?Respiratory/Chest: CTA Bilaterally without wheezes or rales. Normal effort    ?GI/Abdomen: BS +, non-tender, non-distended ?Ext: no clubbing, cyanosis, or edema ?Psych: pleasant and cooperative  ?Skin: intact ?Musculoskeletal:  ?   moving all 4 limbs. Does not appear to be in pain this am ?Neuro:  ?Very Alert and Oriented to person, place, stroke. Right central 7, speech clear..   RUE 4- to 5/5. RLE 3-4/5 LUE/LLE 4+/5. Decreased light touch on right. Still needs cueing for engagement on right .  Decreased LT,  stocking glove sensory loss  below knees bilaterally. DTR's 1+ ?  ?  ?Assessment/Plan: ?1. Functional deficits which require 3+ hours per day of interdisciplinary therapy in a comprehensive inpatient rehab setting. ?Physiatrist is providing close team supervision and 24 hour management of active medical problems listed  below. ?Physiatrist and rehab team continue to assess barriers to discharge/monitor patient progress toward functional and medical goals ? ?Care Tool: ? ?Bathing ?   ?   ? Body parts bathed by helper: Right arm, Left arm, Chest, Abdomen, Front perineal area, Buttocks, Right upper leg, Left upper leg, Left lower leg, Right lower leg, Face ?  ?  ?Bathing assist Assist Level: 2 Helpers ?  ?  ?Upper Body Dressing/Undressing ?Upper body dressing   ?What is the patient wearing?: [place] only ?   ?Upper body assist Assist Level: 2 Helpers ?   ?Lower Body Dressing/Undressing ?Lower body dressing ? ? ?   ?What is the patient wearing?: Incontinence brief ? ?  ? ?Lower body assist Assist for lower body dressing: 2 Helpers ?   ? ?Toileting ?Toileting    ?Toileting assist Assist for toileting: 2 Helpers ?  ?  ?Transfers ?Chair/bed transfer ? ?Transfers assist ? Chair/bed transfer activity did not occur: Safety/medical concerns ? ?Chair/bed transfer assist level: Minimal Assistance - Patient > 75% ?Chair/bed transfer assistive device: Walker ?  ?Locomotion ?Ambulation ? ? ?Ambulation assist ? ? Ambulation activity did not occur: Safety/medical concerns ? ?Assist level: Minimal Assistance - Patient > 75% ?Assistive device: Walker-rolling ?Max distance: 25 ft  ? ?Walk 10 feet activity ? ? ?  Assist ? Walk 10 feet activity did not occur: Safety/medical concerns ? ?Assist level: Minimal Assistance - Patient > 75% ?Assistive device: Walker-rolling  ? ?Walk 50 feet activity ? ? ?Assist Walk 50 feet with 2 turns activity did not occur: Safety/medical concerns ? ?  ?   ? ? ?Walk 150 feet activity ? ? ?Assist Walk 150 feet activity did not occur: Safety/medical concerns ? ?  ?  ?  ? ?Walk 10 feet on uneven surface  ?activity ? ? ?Assist Walk 10 feet on uneven surfaces activity did not occur: Safety/medical concerns ? ? ?  ?   ? ?Wheelchair ? ? ? ? ?Assist Is the patient using a wheelchair?: Yes ?Type of Wheelchair: Manual ?Wheelchair  activity did not occur: Safety/medical concerns ? ?  ?   ? ? ?Wheelchair 50 feet with 2 turns activity ? ? ? ?Assist ? ?  ?Wheelchair 50 feet with 2 turns activity did not occur: Safety/medical concerns ? ? ?   ? ?Wheelchair 150 feet activity  ? ? ? ?Assist ? Wheelchair 150 feet activity did not occur: Safety/medical concerns ? ? ?   ? ?Blood pressure (!) 131/51, pulse (!) 57, temperature 98.3 ?F (36.8 ?C), temperature source Oral, resp. rate 18, height '5\' 6"'$  (1.676 m), weight 95.8 kg, SpO2 100 %. ? ?Medical Problem List and Plan: ?1. Functional deficits secondary to acute multifocal infarct including left ischemic thalamus, left parietal cortical and subcortical infarcts ?        -Continue CIR therapies including PT, OT, and SLP  ? -pt progressing functionally! ?2.  Antithrombotics: ?-DVT/anticoagulation:  Pharmaceutical: Other (comment) Eliquis ?            -antiplatelet therapy: N/A ?3. Pain/left shoulder girdle pain: continue kpad ? -muscle rub for shoulder girdle has helped ?   ?4. Mood: Provide emotional support ?            -antipsychotic agents: N/A ?5. Neuropsych: This patient is not capable of making decisions on her own behalf. ?6. Skin/Wound Care: Routine skin checks ?7. Fluids/Electrolytes/Nutrition: Routine in and outs ?8.  New onset atrial fibrillation with RVR.  Amiodarone 100 mg daily.  Follow-up cardiology services.    ?  HR controlled 3/10 ?9.  Hypertension.  Norvasc 5 mg daily, Imdur 30 mg daily.    ? Borderline control 3/3, SBP still a little high ? 3/3 -increased imdur to '60mg'$  daily--observe this weekend ? -HCTZ still on hold d/t #11 ? 3/9: fair control with norvasc to '10mg'$   ?10.  Suspicion for focal seizure.  Continue Keppra 500 mg twice daily.  EEG not completed due to glued wig could not be removed. ?11.  AKI on CKD stage IIIB- GFR 20.  Marland Kitchen  HCTZ on hold. ? 3/10 BUN/Cr   gradually improving--likely at baseline now (50/2.8) ?  - off HS IVF ?  -encourage PO's ?  -repeat labs Monday 3/13 ?12.   Hyperlipidemia.  Continue Lipitor ?13.  Hypothyroidism.  Synthroid ?14.  Normocytic anemia/monoclonal gammopathy.  Follow-up hematology services Dr.Kale ?15.  Constipation.  MiraLAX twice daily.  Senokot daily. ?16.  Suspect UTI.  treated. ?17.Type 2 diabetes mellitus.HG  AIC 4.5    ?18. Transaminitis: tylenol d/ced ?19. Leukocytosis:    ?  Resolved, 9.1 3/6 ? ?LOS: ?15 days ?A FACE TO FACE EVALUATION WAS PERFORMED ? ?Meredith Staggers ?05/17/2021, 9:29 AM  ? ?   ?

## 2021-05-17 NOTE — Plan of Care (Signed)
?  Problem: Consults ?Goal: RH STROKE PATIENT EDUCATION ?Description: See Patient Education module for education specifics  ?Outcome: Progressing ?  ?Problem: RH BOWEL ELIMINATION ?Goal: RH STG MANAGE BOWEL WITH ASSISTANCE ?Description: STG Manage Bowel with min Assistance. ?Outcome: Progressing ?Goal: RH STG MANAGE BOWEL W/MEDICATION W/ASSISTANCE ?Description: STG Manage Bowel with Medication with [place]. ?Outcome: Progressing ?  ?Problem: RH BLADDER ELIMINATION ?Goal: RH STG MANAGE BLADDER WITH ASSISTANCE ?Description: STG Manage Bladder With min Assistance ?Outcome: Progressing ?Goal: RH STG MANAGE BLADDER WITH MEDICATION WITH ASSISTANCE ?Description: STG Manage Bladder With Medication With min Assistance. ?Outcome: Progressing ?  ?Problem: RH SKIN INTEGRITY ?Goal: RH STG MAINTAIN SKIN INTEGRITY WITH ASSISTANCE ?Description: STG Maintain Skin Integrity With Supervision Assistance. ?Outcome: Progressing ?Goal: RH STG ABLE TO PERFORM INCISION/WOUND CARE W/ASSISTANCE ?Description: STG Able To Perform Incision/Wound Care With min Assistance. ?Outcome: Progressing ?  ?Problem: RH SAFETY ?Goal: RH STG ADHERE TO SAFETY PRECAUTIONS W/ASSISTANCE/DEVICE ?Description: STG Adhere to Safety Precautions With Cues and Reminders. ?Outcome: Progressing ?Goal: RH STG DECREASED RISK OF FALL WITH ASSISTANCE ?Description: STG Decreased Risk of Fall With Supervision Assistance. ?Outcome: Progressing ?  ?Problem: RH COGNITION-NURSING ?Goal: RH STG ANTICIPATES NEEDS/CALLS FOR ASSIST W/ASSIST/CUES ?Description: STG Anticipates Needs/Calls for Assist With Cues and Reminders. ?Outcome: Progressing ?  ?Problem: RH KNOWLEDGE DEFICIT ?Goal: RH STG INCREASE KNOWLEDGE OF HYPERTENSION ?Description: Patient will demonstrate knowledge of HTN medications, dietary restrictions, and BP parameters with educational materials and handouts provided by staff independently at discharge. ?Outcome: Progressing ?Goal: RH STG INCREASE KNOWLEGDE OF  HYPERLIPIDEMIA ?Description: Patient will demonstrate knowledge of cholesterol medications, dietary restrictions, and lab values with educational materials and handouts provided by staff independently at discharge. ?Outcome: Progressing ?Goal: RH STG INCREASE KNOWLEDGE OF STROKE PROPHYLAXIS ?Description: Patient will demonstrate knowledge of medications used to prevent future strokes with educational materials and handouts provided by staff independently at discharge. ?Outcome: Progressing ?  ?

## 2021-05-17 NOTE — Progress Notes (Signed)
Physical Therapy Session Note ? ?Patient Details  ?Name: Rita Yoder ?MRN: [id number] ?Date of Birth: 12/22/1940 ? ?Today's Date: 05/17/2021 ?PT Individual Time: 4098-1191 ?PT Individual Time Calculation (min): 42 min  ? ?Short Term Goals: ?Week 2:  PT Short Term Goal 1 (Week 2): Patietn will perform bed mobility with min A using hospital bed features >75% of the time. ?PT Short Term Goal 2 (Week 2): Patient will perform basic transfer with min A using LRAD consistently. ?PT Short Term Goal 3 (Week 2): Patient will ambulate >25 feet using LRAD with min A. ? ?Skilled Therapeutic Interventions/Progress Updates:  ?   ?Pt received supine and agreeable to therapy. Reports chronic L hip pain. Number not provided but reports "no more than normal". PT provides rest breaks as needed to manage pain. Supine to sit with bed features and cues for positioning. Pt performs sit to stand with cues for hand placement and initiation, with CGA. Stand step transfer to [facility] with CGA and cues for positioning and safe placement of hands. WC transport to gym for time management. Pt performs sit to stand with verbal cues for sequencing, and ambulates 54' with RW and CGA. Pt very forward flexed and ambulates with shortened stride lengths, bilaterally. PT provides verbal and tactile cues to correct gait pattern and pt responds that she cannot adjust. Following extended seated rest break, pt ambulates additional 100' with minA to help maintain proximity to RW, as pt tends to push far out ahead of body. WC transport back to room. Pt performs stand step transfer back to bed with CGA and cues for positioning. MinA for sit to supine. Left with alarm intact and all needs within reach. ? ?Therapy Documentation ?Precautions:  ?Precautions ?Precautions: Fall ?Precaution Comments: Rt hemiparesis and Rt inattention ?Restrictions ?Weight Bearing Restrictions: No ? ? ?Therapy/Group: Individual Therapy ? ?Breck Coons, PT, DPT ?05/17/2021, 2:10 PM  ?

## 2021-05-18 LAB — CBC
HCT: 32 % — ABNORMAL LOW (ref 36.0–46.0)
Hemoglobin: 10 g/dL — ABNORMAL LOW (ref 12.0–15.0)
MCH: 32.2 pg (ref 26.0–34.0)
MCHC: 31.3 g/dL (ref 30.0–36.0)
MCV: 102.9 fL — ABNORMAL HIGH (ref 80.0–100.0)
Platelets: 229 10*3/uL (ref 150–400)
RBC: 3.11 MIL/uL — ABNORMAL LOW (ref 3.87–5.11)
RDW: 12.9 % (ref 11.5–15.5)
WBC: 9.1 10*3/uL (ref 4.0–10.5)
nRBC: 0 % (ref 0.0–0.2)

## 2021-05-18 LAB — BASIC METABOLIC PANEL
Anion gap: 11 (ref 5–15)
BUN: 45 mg/dL — ABNORMAL HIGH (ref 8–23)
CO2: 16 mmol/L — ABNORMAL LOW (ref 22–32)
Calcium: 9.5 mg/dL (ref 8.9–10.3)
Chloride: 113 mmol/L — ABNORMAL HIGH (ref 98–111)
Creatinine, Ser: 3.22 mg/dL — ABNORMAL HIGH (ref 0.44–1.00)
GFR, Estimated: 14 mL/min — ABNORMAL LOW (ref >60)
Glucose, Bld: 159 mg/dL — ABNORMAL HIGH (ref 70–99)
Potassium: 5.4 mmol/L — ABNORMAL HIGH (ref 3.5–5.1)
Sodium: 140 mmol/L (ref 135–145)

## 2021-05-18 LAB — GLUCOSE, CAPILLARY: Glucose-Capillary: 163 mg/dL — ABNORMAL HIGH (ref 70–99)

## 2021-05-18 MED ORDER — SODIUM CHLORIDE 0.9 % IV SOLN
INTRAVENOUS | Status: AC
Start: 2021-05-18 — End: 2021-05-18

## 2021-05-18 MED ORDER — SODIUM ZIRCONIUM CYCLOSILICATE 5 G PO PACK
5.0000 g | PACK | Freq: Once | ORAL | Status: AC
  Administered 2021-05-18: 5 g via ORAL
  Filled 2021-05-18: qty 1

## 2021-05-18 NOTE — Progress Notes (Signed)
?                                                       PROGRESS NOTE ? ? ?Subjective/Complaints: ?In good spirits. No problems overnight. Denies pain today. Pleased with her therapy progress ? ?ROS: Patient denies fever, rash, sore throat, blurred vision, dizziness, nausea, vomiting, diarrhea, cough, shortness of breath or chest pain, joint or back/neck pain, headache, or mood change.  ? ?Objective: ?  ?No results found. ?No results for input(s): WBC, HGB, HCT, PLT in the last 72 hours. ? ? ?No results for input(s): NA, K, CL, CO2, GLUCOSE, BUN, CREATININE, CALCIUM in the last 72 hours. ? ? ?Intake/Output Summary (Last 24 hours) at 05/18/2021 0827 ?Last data filed at 05/17/2021 1755 ?Gross per 24 hour  ?Intake 210 ml  ?Output --  ?Net 210 ml  ?  ? ?  ? ?Physical Exam: ?Vital Signs ?Blood pressure 116/62, pulse 60, temperature 98.6 ?F (37 ?C), resp. rate 18, height '5\' 6"'$  (1.676 m), weight 95.8 kg, SpO2 98 %. ?Constitutional: No distress . Vital signs reviewed. ?HEENT: NCAT, EOMI, oral membranes moist ?Neck: supple ?Cardiovascular: RRR without murmur. No JVD    ?Respiratory/Chest: CTA Bilaterally without wheezes or rales. Normal effort    ?GI/Abdomen: BS +, non-tender, non-distended ?Ext: no clubbing, cyanosis, or edema ?Psych: pleasant and cooperative  ?Skin: intact ?Musculoskeletal:  ?   moving all 4 limbs. No pain today ?Neuro:  ?Very Alert and Oriented to person, place, stroke. Right central 7, speech clear..   RUE 4- to 5/5. RLE 3-4/5 LUE/LLE 4+/5. Decreased light touch on right. Still needs cueing for engagement on right .  Decreased LT,  stocking glove sensory loss  below knees bilaterally. DTR's 1+ ?  ?  ?Assessment/Plan: ?1. Functional deficits which require 3+ hours per day of interdisciplinary therapy in a comprehensive inpatient rehab setting. ?Physiatrist is providing close team supervision and 24 hour management of active medical problems listed below. ?Physiatrist and rehab team continue to assess  barriers to discharge/monitor patient progress toward functional and medical goals ? ?Care Tool: ? ?Bathing ?   ?   ? Body parts bathed by helper: Right arm, Left arm, Chest, Abdomen, Front perineal area, Buttocks, Right upper leg, Left upper leg, Left lower leg, Right lower leg, Face ?  ?  ?Bathing assist Assist Level: 2 Helpers ?  ?  ?Upper Body Dressing/Undressing ?Upper body dressing   ?What is the patient wearing?: [place] only ?   ?Upper body assist Assist Level: 2 Helpers ?   ?Lower Body Dressing/Undressing ?Lower body dressing ? ? ?   ?What is the patient wearing?: Incontinence brief ? ?  ? ?Lower body assist Assist for lower body dressing: 2 Helpers ?   ? ?Toileting ?Toileting    ?Toileting assist Assist for toileting: 2 Helpers ?  ?  ?Transfers ?Chair/bed transfer ? ?Transfers assist ? Chair/bed transfer activity did not occur: Safety/medical concerns ? ?Chair/bed transfer assist level: Minimal Assistance - Patient > 75% ?Chair/bed transfer assistive device: Walker ?  ?Locomotion ?Ambulation ? ? ?Ambulation assist ? ? Ambulation activity did not occur: Safety/medical concerns ? ?Assist level: Minimal Assistance - Patient > 75% ?Assistive device: Walker-rolling ?Max distance: 25 ft  ? ?Walk 10 feet activity ? ? ?Assist ? Walk 10 feet activity did not occur:  Safety/medical concerns ? ?Assist level: Minimal Assistance - Patient > 75% ?Assistive device: Walker-rolling  ? ?Walk 50 feet activity ? ? ?Assist Walk 50 feet with 2 turns activity did not occur: Safety/medical concerns ? ?  ?   ? ? ?Walk 150 feet activity ? ? ?Assist Walk 150 feet activity did not occur: Safety/medical concerns ? ?  ?  ?  ? ?Walk 10 feet on uneven surface  ?activity ? ? ?Assist Walk 10 feet on uneven surfaces activity did not occur: Safety/medical concerns ? ? ?  ?   ? ?Wheelchair ? ? ? ? ?Assist Is the patient using a wheelchair?: Yes ?Type of Wheelchair: Manual ?Wheelchair activity did not occur: Safety/medical concerns ? ?  ?    ? ? ?Wheelchair 50 feet with 2 turns activity ? ? ? ?Assist ? ?  ?Wheelchair 50 feet with 2 turns activity did not occur: Safety/medical concerns ? ? ?   ? ?Wheelchair 150 feet activity  ? ? ? ?Assist ? Wheelchair 150 feet activity did not occur: Safety/medical concerns ? ? ?   ? ?Blood pressure 116/62, pulse 60, temperature 98.6 ?F (37 ?C), resp. rate 18, height '5\' 6"'$  (1.676 m), weight 95.8 kg, SpO2 98 %. ? ?Medical Problem List and Plan: ?1. Functional deficits secondary to acute multifocal infarct including left ischemic thalamus, left parietal cortical and subcortical infarcts ?       -Continue CIR therapies including PT, OT, and SLP  ? -pt progressing functionally! ?2.  Antithrombotics: ?-DVT/anticoagulation:  Pharmaceutical: Other (comment) Eliquis ?            -antiplatelet therapy: N/A ?3. Pain/left shoulder girdle pain: continue kpad ? -muscle rub for shoulder girdle has helped ?  -pain improved ?4. Mood: Provide emotional support ?            -antipsychotic agents: N/A ?5. Neuropsych: This patient is not capable of making decisions on her own behalf. ?6. Skin/Wound Care: Routine skin checks ?7. Fluids/Electrolytes/Nutrition: Routine in and outs ?8.  New onset atrial fibrillation with RVR.  Amiodarone 100 mg daily.  Follow-up cardiology services.    ?  HR controlled 3/10 ?9.  Hypertension.  Norvasc 10 mg daily, Imdur 60 mg daily.    ? -HCTZ still on hold d/t #11 ? 3/11: controlled with norvasc and imdur ?10.  Suspicion for focal seizure.  Continue Keppra 500 mg twice daily.  EEG not completed due to glued wig could not be removed. ?11.  AKI on CKD stage IIIB- GFR 20.  Marland Kitchen  HCTZ on hold. ? 3/10 BUN/Cr   gradually improving--likely at baseline now (50/2.8) ?  - off HS IVF ?  -encourage PO's ?  -recheck labs Monday 3/13 ?12.  Hyperlipidemia.  Continue Lipitor ?13.  Hypothyroidism.  Synthroid ?14.  Normocytic anemia/monoclonal gammopathy.  Follow-up hematology services Dr.Kale ?15.  Constipation.  MiraLAX twice  daily.  Senokot daily. ?16.  Suspect UTI.  treated. ?17.Type 2 diabetes mellitus.HG  AIC 4.5    ?18. Transaminitis: tylenol d/ced ?19. Leukocytosis:    ?  Resolved, 9.1 3/6 ? ?LOS: ?16 days ?A FACE TO FACE EVALUATION WAS PERFORMED ? ?Meredith Staggers ?05/18/2021, 8:27 AM  ? ?   ?

## 2021-05-18 NOTE — Progress Notes (Signed)
At approximately 0930 writer called into the room by NT due to patients increased confusion. Patient sitting on BSC with increased right sided weakness, slurred/slowed speech. First B/P obtained B/P 81/50 P 18 R 20 BS 163. Patient returned to bed, new B/P  122/67 P64 R 18. Dr. Naaman Plummer notified for increased for metal status change and weakness. Patient slowly began to return to base line. New orders IV 0.9 '@100'$  HR x 5 hours and labs to be drawn tomorrow. Patient to skip PT today and have PT tomorrow. Family remains at bedside, call light within reach.  ?

## 2021-05-18 NOTE — Progress Notes (Signed)
Physical Therapy Session Note ? ?Patient Details  ?Name: Rita Yoder ?MRN: [id number] ?Date of Birth: September 12, 1940 ? ?Today's Date: 05/18/2021 ?PT Missed Time: 60 Minutes ?Missed Time Reason: MD hold (Comment) (AMS and hypotension) ? ?Short Term Goals: ?Week 2:  PT Short Term Goal 1 (Week 2): Patietn will perform bed mobility with min A using hospital bed features >75% of the time. ?PT Short Term Goal 2 (Week 2): Patient will perform basic transfer with min A using LRAD consistently. ?PT Short Term Goal 3 (Week 2): Patient will ambulate >25 feet using LRAD with min A. ? ?Skilled Therapeutic Interventions/Progress Updates:  ?   ?Pt reportedly had episode of hypotension, slurred speech, and AMS shortly prior to arrival of PT. Per daughter, MD placed pt on bedrest for remainder of day. Pt misses 60 minutes of skilled PT. ? ?Therapy Documentation ?Precautions:  ?Precautions ?Precautions: Fall ?Precaution Comments: Rt hemiparesis and Rt inattention ?Restrictions ?Weight Bearing Restrictions: No ?General: ?PT Amount of Missed Time (min): 60 Minutes ?PT Missed Treatment Reason: MD hold (Comment) (AMS and hypotension) ? ? ?Therapy/Group: Individual Therapy ? ?Breck Coons, PT, DPT ?05/18/2021, 12:56 PM  ?

## 2021-05-18 NOTE — Progress Notes (Signed)
Called to see patient after she became weak, less responsive while trying to use the toilet. RN reported more weakness on right side as well. Upon my arrival to her room, she was back in bed, feeling better. Still a little light-headed but moving right arm and leg close to her baseline and exam this morning. Speech/language close to baseline already as well. HR a little tachy, normal rhythm.   ? ?Assessment: ?Likely vasovagal episode in setting of mild volume depletion.  ?-I took her off IVF several days ago, and she had been sustaining her fluid intake it appeared. ? ?Plan: ?IV NS 100cc/hr for 5 hours ?Check CBC/BMET ?Hold therapies today---can resume tomorrow if no further issues.  ? ?Meredith Staggers, MD, FAAPMR ?[facility] ?Medical Director Rehabilitation Services ?05/18/2021  ?

## 2021-05-19 LAB — BASIC METABOLIC PANEL
Anion gap: 8 (ref 5–15)
BUN: 43 mg/dL — ABNORMAL HIGH (ref 8–23)
CO2: 16 mmol/L — ABNORMAL LOW (ref 22–32)
Calcium: 8.8 mg/dL — ABNORMAL LOW (ref 8.9–10.3)
Chloride: 112 mmol/L — ABNORMAL HIGH (ref 98–111)
Creatinine, Ser: 3.18 mg/dL — ABNORMAL HIGH (ref 0.44–1.00)
GFR, Estimated: 14 mL/min — ABNORMAL LOW (ref >60)
Glucose, Bld: 95 mg/dL (ref 70–99)
Potassium: 4.3 mmol/L (ref 3.5–5.1)
Sodium: 136 mmol/L (ref 135–145)

## 2021-05-19 NOTE — Progress Notes (Signed)
Patient continues to get dizzy when standing to get orthostatic B/P. Two attempts to complete ortho B/P. Patient sits down due to dizziness. Will attempt to obtain later this evening ?

## 2021-05-19 NOTE — Progress Notes (Signed)
Occupational Therapy Session Note ? ?Patient Details  ?Name: Rita Yoder ?MRN: [id number] ?Date of Birth: [birth year] ? ?Today's Date: 05/19/2021 ?OT Individual Time: 1415-1500 ?OT Individual Time Calculation (min): 45 min  ? ? ?Short Term Goals: ?Week 2:  OT Short Term Goal 1 (Week 2): Pt will transfer to [facility] wiht +1 A and LRAD to decrease BOC ?OT Short Term Goal 1 - Progress (Week 2): Met ?OT Short Term Goal 2 (Week 2): Pt will complete 1/3 components of toileting with MIN A for balance ?OT Short Term Goal 2 - Progress (Week 2): Progressing toward goal ?OT Short Term Goal 3 (Week 2): pt will thread 1LE into pants with no  more than CGA for sitting balance ?OT Short Term Goal 3 - Progress (Week 2): Met ?Week 3:  OT Short Term Goal 1 (Week 3): STG=LTG d/t ELOS ? ? ?Skilled Therapeutic Interventions/Progress Updates:  ?  Pt greeted at time of session bed level resting agreeable to OT session, son present. No pain reported. Note the pt's board states that pt on bed rest as of 3/11, and when checked MD notes, states "can resume tomorrow if no further issues." And after speaking with nursing determined it was OK for therapy to treat as there were no further issues. BP checked in supine at 133/84, sitting at 136/116, and 139/47 in standing. Note bed mobility with Min/Mod for supine <> sit and scoot up in bed with 2 helpers. Pt able to stand approx 45 seconds for BP reading in standing. Pt resting bed level call bell in reach all needs met.  ? ?Therapy Documentation ?Precautions:  ?Precautions ?Precautions: Fall ?Precaution Comments: Rt hemiparesis and Rt inattention ?Restrictions ?Weight Bearing Restrictions: No ? ? ? ? ?Therapy/Group: Individual Therapy ? ?Viona Gilmore ?05/19/2021, 12:37 PM ?

## 2021-05-19 NOTE — Progress Notes (Addendum)
?                                                       PROGRESS NOTE ? ? ?Subjective/Complaints: ?No futher fainting spells, discussed fluid intake with pt and grand daughter  ? ?ROS: Patient denies CP, SOB, N/V/D ? ?Objective: ?  ?No results found. ?Recent Labs  ?  05/18/21 ?0954  ?WBC 9.1  ?HGB 10.0*  ?HCT 32.0*  ?PLT 229  ? ? ? ?Recent Labs  ?  05/18/21 ?0954 05/19/21 ?0652  ?NA 140 136  ?K 5.4* 4.3  ?CL 113* 112*  ?CO2 16* 16*  ?GLUCOSE 159* 95  ?BUN 45* 43*  ?CREATININE 3.22* 3.18*  ?CALCIUM 9.5 8.8*  ? ? ? ?Intake/Output Summary (Last 24 hours) at 05/19/2021 1028 ?Last data filed at 05/18/2021 1300 ?Gross per 24 hour  ?Intake 700 ml  ?Output --  ?Net 700 ml  ? ?  ? ?  ? ?Physical Exam: ?Vital Signs ?Blood pressure 135/75, pulse (!) 58, temperature 98.7 ?F (37.1 ?C), resp. rate 17, height '5\' 6"'$  (1.676 m), weight 95.8 kg, SpO2 100 %. ? ?General: No acute distress ?Mood and affect are appropriate ?Heart: Regular rate and rhythm no rubs murmurs or extra sounds ?Lungs: Clear to auscultation, breathing unlabored, no rales or wheezes ?Abdomen: Positive bowel sounds, soft nontender to palpation, nondistended ?Extremities: No clubbing, cyanosis, or edema ?Skin: No evidence of breakdown, no evidence of rash ?Musculoskeletal:  ?   moving all 4 limbs. No pain today ?Neuro:  ?Very Alert and Oriented to person, place, stroke. Right central 7, speech clear..   RUE 4- to 5/5. RLE 3-4/5 LUE/LLE 4+/5. Decreased light touch on right. Still needs cueing for engagement on right .  Decreased LT,  stocking glove sensory loss  below knees bilaterally. DTR's 1+ ?  ?  ?Assessment/Plan: ?1. Functional deficits which require 3+ hours per day of interdisciplinary therapy in a comprehensive inpatient rehab setting. ?Physiatrist is providing close team supervision and 24 hour management of active medical problems listed below. ?Physiatrist and rehab team continue to assess barriers to discharge/monitor patient progress toward functional and  medical goals ? ?Care Tool: ? ?Bathing ?   ?   ? Body parts bathed by helper: Right arm, Left arm, Chest, Abdomen, Front perineal area, Buttocks, Right upper leg, Left upper leg, Left lower leg, Right lower leg, Face ?  ?  ?Bathing assist Assist Level: 2 Helpers ?  ?  ?Upper Body Dressing/Undressing ?Upper body dressing   ?What is the patient wearing?: [place] only ?   ?Upper body assist Assist Level: 2 Helpers ?   ?Lower Body Dressing/Undressing ?Lower body dressing ? ? ?   ?What is the patient wearing?: Incontinence brief ? ?  ? ?Lower body assist Assist for lower body dressing: 2 Helpers ?   ? ?Toileting ?Toileting    ?Toileting assist Assist for toileting: 2 Helpers ?  ?  ?Transfers ?Chair/bed transfer ? ?Transfers assist ? Chair/bed transfer activity did not occur: Safety/medical concerns ? ?Chair/bed transfer assist level: Minimal Assistance - Patient > 75% ?Chair/bed transfer assistive device: Walker ?  ?Locomotion ?Ambulation ? ? ?Ambulation assist ? ? Ambulation activity did not occur: Safety/medical concerns ? ?Assist level: Minimal Assistance - Patient > 75% ?Assistive device: Walker-rolling ?Max distance: 25 ft  ? ?Walk 10  feet activity ? ? ?Assist ? Walk 10 feet activity did not occur: Safety/medical concerns ? ?Assist level: Minimal Assistance - Patient > 75% ?Assistive device: Walker-rolling  ? ?Walk 50 feet activity ? ? ?Assist Walk 50 feet with 2 turns activity did not occur: Safety/medical concerns ? ?  ?   ? ? ?Walk 150 feet activity ? ? ?Assist Walk 150 feet activity did not occur: Safety/medical concerns ? ?  ?  ?  ? ?Walk 10 feet on uneven surface  ?activity ? ? ?Assist Walk 10 feet on uneven surfaces activity did not occur: Safety/medical concerns ? ? ?  ?   ? ?Wheelchair ? ? ? ? ?Assist Is the patient using a wheelchair?: Yes ?Type of Wheelchair: Manual ?Wheelchair activity did not occur: Safety/medical concerns ? ?  ?   ? ? ?Wheelchair 50 feet with 2 turns activity ? ? ? ?Assist ? ?   ?Wheelchair 50 feet with 2 turns activity did not occur: Safety/medical concerns ? ? ?   ? ?Wheelchair 150 feet activity  ? ? ? ?Assist ? Wheelchair 150 feet activity did not occur: Safety/medical concerns ? ? ?   ? ?Blood pressure 135/75, pulse (!) 58, temperature 98.7 ?F (37.1 ?C), resp. rate 17, height '5\' 6"'$  (1.676 m), weight 95.8 kg, SpO2 100 %. ? ?Medical Problem List and Plan: ?1. Functional deficits secondary to acute multifocal infarct including left ischemic thalamus, left parietal cortical and subcortical infarcts ?       -Continue CIR therapies including PT, OT, and SLP  ? -pt progressing functionally! ?2.  Antithrombotics: ?-DVT/anticoagulation:  Pharmaceutical: Other (comment) Eliquis ?            -antiplatelet therapy: N/A ?3. Pain/left shoulder girdle pain: continue kpad ? -muscle rub for shoulder girdle has helped ?  -pain improved ?4. Mood: Provide emotional support ?            -antipsychotic agents: N/A ?5. Neuropsych: This patient is not capable of making decisions on her own behalf. ?6. Skin/Wound Care: Routine skin checks ?7. Fluids/Electrolytes/Nutrition: Routine in and outs ?8.  New onset atrial fibrillation with RVR.  Amiodarone 100 mg daily.  Follow-up cardiology services.    ? Bradycardia 58 bpm this am  ?9.  Hypertension.  Norvasc 10 mg daily, Imdur 60 mg daily.    ? -HCTZ still on hold d/t #11 ? 3/11: controlled with norvasc and imdur ?Vitals:  ? 05/18/21 1924 05/19/21 0417  ?BP: (!) 149/78 135/75  ?Pulse: 89 (!) 58  ?Resp: 18 17  ?Temp: 98.7 ?F (37.1 ?C) 98.7 ?F (37.1 ?C)  ?SpO2: 100% 100%  ?Consider reduction of amlodipine dose although supine BPs are good ? ?10.  Suspicion for focal seizure.  Continue Keppra 500 mg twice daily.  EEG not completed due to glued wig could not be removed. ?11.  AKI on CKD stage IIIB- GFR 20.  Marland Kitchen  HCTZ on hold. ? 3/10 BUN/Cr   gradually improving--likely at baseline now  ?  - off HS IVF but had 559m fluid bolus due to vasovagal episode, check ortho  vitals ?   ?BMP Latest Ref Rng & Units 05/19/2021 05/18/2021 05/13/2021  ?Glucose 70 - 99 mg/dL 95 159(H) 88  ?BUN 8 - 23 mg/dL 43(H) 45(H) 51(H)  ?Creatinine 0.44 - 1.00 mg/dL 3.18(H) 3.22(H) 2.81(H)  ?BUN/Creat Ratio 12 - 28 - - -  ?Sodium 135 - 145 mmol/L 136 140 139  ?Potassium 3.5 - 5.1 mmol/L 4.3 5.4(H) 4.2  ?Chloride  98 - 111 mmol/L 112(H) 113(H) 112(H)  ?CO2 22 - 32 mmol/L 16(L) 16(L) 18(L)  ?Calcium 8.9 - 10.3 mg/dL 8.8(L) 9.5 9.0  ?Po fluid intake <526m per day last 2 d, resume hs IVF ? ?12.  Hyperlipidemia.  Continue Lipitor ?13.  Hypothyroidism.  Synthroid ?14.  Normocytic anemia/monoclonal gammopathy.  Follow-up hematology services Dr.Kale ?15.  Constipation.  MiraLAX twice daily.  Senokot daily. ?16.  Suspect UTI.  treated. ?17.Type 2 diabetes mellitus.HG  AIC 4.5    ?18. Transaminitis: tylenol d/ced ?19. Leukocytosis:    ?  Resolved, 9.1 3/6 ? ?LOS: ?17 days ?A FACE TO FACE EVALUATION WAS PERFORMED ? ?ALuanna SalkKirsteins ?05/19/2021, 10:28 AM  ? ?   ?

## 2021-05-20 ENCOUNTER — Telehealth: Payer: Self-pay

## 2021-05-20 LAB — BASIC METABOLIC PANEL
Anion gap: 7 (ref 5–15)
BUN: 46 mg/dL — ABNORMAL HIGH (ref 8–23)
CO2: 19 mmol/L — ABNORMAL LOW (ref 22–32)
Calcium: 9 mg/dL (ref 8.9–10.3)
Chloride: 112 mmol/L — ABNORMAL HIGH (ref 98–111)
Creatinine, Ser: 3.24 mg/dL — ABNORMAL HIGH (ref 0.44–1.00)
GFR, Estimated: 14 mL/min — ABNORMAL LOW (ref >60)
Glucose, Bld: 84 mg/dL (ref 70–99)
Potassium: 4.7 mmol/L (ref 3.5–5.1)
Sodium: 138 mmol/L (ref 135–145)

## 2021-05-20 LAB — CBC
HCT: 29.3 % — ABNORMAL LOW (ref 36.0–46.0)
Hemoglobin: 9.5 g/dL — ABNORMAL LOW (ref 12.0–15.0)
MCH: 32.6 pg (ref 26.0–34.0)
MCHC: 32.4 g/dL (ref 30.0–36.0)
MCV: 100.7 fL — ABNORMAL HIGH (ref 80.0–100.0)
Platelets: 180 10*3/uL (ref 150–400)
RBC: 2.91 MIL/uL — ABNORMAL LOW (ref 3.87–5.11)
RDW: 12.8 % (ref 11.5–15.5)
WBC: 9.6 10*3/uL (ref 4.0–10.5)
nRBC: 0 % (ref 0.0–0.2)

## 2021-05-20 MED ORDER — AMIODARONE HCL 200 MG PO TABS
200.0000 mg | ORAL_TABLET | Freq: Two times a day (BID) | ORAL | Status: DC
Start: 2021-05-20 — End: 2021-05-21
  Administered 2021-05-20 – 2021-05-21 (×2): 200 mg via ORAL
  Filled 2021-05-20 (×2): qty 1

## 2021-05-20 NOTE — Progress Notes (Signed)
Speech Language Pathology Daily Session Note ? ?Patient Details  ?Name: Rita Yoder ?MRN: [id number] ?Date of Birth: [birth year] ? ?Today's Date: 05/20/2021 ?SLP Individual Time: 8315-1761 ?SLP Individual Time Calculation (min): 40 min ? ?Short Term Goals: ?Week 3: SLP Short Term Goal 1 (Week 3): STGs=LTGs due to ELOS ? ?Skilled Therapeutic Interventions: Skilled treatment session focused on cognitive-linguistic goals. SLP facilitated session by providing extra time and overall Min A verbal cues for naming of functional items. Patient completed a verbal reasoning task by comparing and contrasting the two items with overall Max A multimodal cues. Patient was awake, alert and cooperative throughout session. Patient left upright in bed with alarm on and all needs within reach. Continue with current plan of care.  ?   ? ?Pain ?Pain Assessment ?Pain Scale: 0-10 ?Pain Score: 0-No pain ? ?Therapy/Group: Individual Therapy ? ?Asiyah Pineau ?05/20/2021, 12:42 PM ?

## 2021-05-20 NOTE — Progress Notes (Signed)
Subjective:  ?Patient complained of dizziness earlier associated with racing of the heartbeat noted to be in A-fib with RVR subsequently had EKG which shows A-fib with moderate ventricular response and nonspecific ST-T wave changes..  Patient denies any anginal chest pain or shortness of breath ? ?Objective:  ?Vital Signs in the last 24 hours: ?Temp:  [98.1 ?F (36.7 ?C)-98.6 ?F (37 ?C)] 98.1 ?F (36.7 ?C) (03/13 0448) ?Pulse Rate:  [80-101] 80 (03/13 0448) ?Resp:  [16-17] 16 (03/13 0448) ?BP: (114-140)/(64-75) 126/75 (03/13 0448) ?SpO2:  [99 %-100 %] 100 % (03/13 0448) ? ?Intake/Output from previous day: ?03/12 0701 - 03/13 0700 ?In: 680 [P.O.:680] ?Out: -  ?Intake/Output from this shift: ?Total I/O ?In: 79 [P.O.:58] ?Out: -  ? ?Physical Exam: ?Neck: no adenopathy, no carotid bruit, no JVD, and supple, symmetrical, trachea midline ?Lungs: clear to auscultation bilaterally ?Heart: irregularly irregular rhythm and S1, S2 normal ?Abdomen: soft, non-tender; bowel sounds normal; no masses,  no organomegaly ?Extremities: extremities normal, atraumatic, no cyanosis or edema ? ?Lab Results: ?Recent Labs  ?  05/18/21 ?8466 05/20/21 ?5993  ?WBC 9.1 9.6  ?HGB 10.0* 9.5*  ?PLT 229 180  ? ?Recent Labs  ?  05/19/21 ?0652 05/20/21 ?5701  ?NA 136 138  ?K 4.3 4.7  ?CL 112* 112*  ?CO2 16* 19*  ?GLUCOSE 95 84  ?BUN 43* 46*  ?CREATININE 3.18* 3.24*  ? ?No results for input(s): TROPONINI in the last 72 hours. ? ?Invalid input(s): CK, MB ?Hepatic Function Panel ?No results for input(s): PROT, ALBUMIN, AST, ALT, ALKPHOS, BILITOT, BILIDIR, IBILI in the last 72 hours. ?No results for input(s): CHOL in the last 72 hours. ?No results for input(s): PROTIME in the last 72 hours. ? ?Imaging: ?Imaging results have been reviewed and No results found. ? ?Cardiac Studies: ? ?Assessment/Plan:  ?S/P probable cardioembolic multifocal strokes including left ischemic thalamic stroke and left parietal cortical and subcortical infarcts.with possible remote  subarachnoid hemorrhage ?Recurrent Paroxysmal atrial fibrillation Chadsvasc score of 6 started on chronic anticoagulation. ?Hypertension. ?Hyperlipidemia. ?Diabetes mellitus.controlled by diet ?Chronic kidney disease stage IV. ?Status post questionable focal seizures. ?Hypothyroidism. ?Status post UTI ?Plan ?Increase amiodarone to 200 mg twice daily for few days and then once converted to sinus rhythm we will reduce it to once a day ?Check EKG daily x2 ? LOS: 18 days  ? ? ?Charolette Forward ?05/20/2021, 11:48 AM ? ? ? ?

## 2021-05-20 NOTE — Discharge Summary (Incomplete)
Physician Discharge Summary  Patient ID: Rita Yoder MRN: [id number] DOB/AGE: 82-08-1940 82 y.o.  Admit date: 05/02/2021 Discharge date: 05/24/2021  Discharge Diagnoses:  Principal Problem:   Subcortical infarction [facility]) Active Problems:   AKI (acute kidney injury) ([place])   Right-sided visual neglect   Hyperglycemia New onset atrial fibrillation with RVR Hypertension Suspicion of focal seizure Hyperlipidemia Hypothyroidism Normocytic anemia/monoclonal gammopathy Type 2 diabetes mellitus Transaminitis Nonobstructive CAD  Discharged Condition: Stable  Significant Diagnostic Studies: MR ANGIO HEAD WO CONTRAST  Result Date: 04/23/2021 CLINICAL DATA:  Provided history: Stroke, follow-up. EXAM: MRA HEAD WITHOUT CONTRAST TECHNIQUE: Angiographic images of the Circle of Willis were acquired using MRA technique without intravenous contrast. COMPARISON:  No pertinent prior exam. FINDINGS: Anterior circulation: The intracranial internal carotid arteries are patent. The M1 middle cerebral arteries are patent. Severe stenosis within the mid-to-distal M1 left MCA. No M2 proximal branch occlusion is identified. Atherosclerotic irregularity of the M2 and more distal MCA vessels on the left. The anterior cerebral arteries are patent. No intracranial aneurysm is identified. Posterior circulation: The intracranial vertebral arteries are patent. The basilar artery is patent. The posterior cerebral arteries are patent. Atherosclerotic irregularity of both vessels. Most notably, there are multiple sites of moderate/severe stenosis within the P2 left PCA. Posterior communicating arteries are present bilaterally. Anatomic variants: None significant. IMPRESSION: No intracranial large vessel occlusion is identified. Intracranial atherosclerotic disease with multifocal stenoses, most notably as follows. Multiple sites of moderate/severe stenosis within the P2 left PCA. Severe stenosis within the mid-to-distal  M1 left MCA. Electronically Signed   By: Kellie Simmering D.O.   On: 04/23/2021 19:18   MR ANGIO NECK WO CONTRAST  Result Date: 04/23/2021 CLINICAL DATA:  Provided history: Stroke, follow-up. EXAM: MRA NECK WITHOUT CONTRAST TECHNIQUE: Angiographic images of the neck were acquired using MRA technique without intravenous contrast. Carotid stenosis measurements (when applicable) are obtained utilizing NASCET criteria, using the distal internal carotid diameter as the denominator. COMPARISON:  Noncontrast head CT performed earlier today 04/23/2021. Concurrently performed MRA of the head. FINDINGS: Examination limited by motion degradation and non-contrast technique. Common origin of the innominate and left common carotid arteries. Motion degradation and non-contrast technique limits evaluation of the proximal right common carotid artery. Within this limitation, the common carotid and internal carotid arteries appear patent within the neck without hemodynamically significant stenosis. Mild atherosclerotic plaque within the mid left CCA. Mild irregularity of the cervical internal carotid arteries, bilaterally, which may reflect atherosclerotic disease or sequela of fibromuscular dysplasia. Motion degradation and non-contrast technique limits evaluation of the origins of the vertebral arteries. Elsewhere, the dominant left vertebral artery is patent within the neck without appreciable stenosis. The right vertebral artery is developmentally diminutive, but appears patent within the neck. IMPRESSION: Examination limited by motion degradation and non-contrast technique. No appreciable hemodynamically significant stenosis within the common carotid or internal carotid arteries within the neck. Mild atherosclerotic plaque within the mid left common carotid artery. Mild irregularity of the mid cervical internal carotid arteries, bilaterally, which may reflect atherosclerotic disease or sequela of fibromuscular dysplasia.  Additionally, there is a partially retropharyngeal course of the cervical internal carotid arteries. Motion degradation and non-contrast technique limits evaluation of the vertebral artery origins. Elsewhere, the dominant left vertebral artery is patent within the neck without appreciable stenosis. The right vertebral artery is developmentally diminutive, but appears patent within the neck. Electronically Signed   By: Kellie Simmering D.O.   On: 04/23/2021 19:27   MR BRAIN WO CONTRAST  Result  Date: 04/23/2021 CLINICAL DATA:  Provided history: Stroke, follow-up. EXAM: MRI HEAD WITHOUT CONTRAST TECHNIQUE: Multiplanar, multiecho pulse sequences of the brain and surrounding structures were obtained without intravenous contrast. COMPARISON:  Noncontrast head CT performed earlier today 04/23/2021. Concurrently performed brain MRI and MRA head 04/23/2021. FINDINGS: Mild intermittent motion degradation. Brain: Mild generalized cerebral and cerebellar atrophy. Small patchy acute cortical/subcortical infarcts within the left parietal lobe. The largest infarct at this site is cortically-based, measuring 2.6 cm in greatest dimension (series 3, image 8). Background moderate to moderately advanced patchy and confluent T2 FLAIR hyperintense signal abnormality within the cerebral white matter, nonspecific but compatible chronic small vessel ischemic disease. Chronic small vessel ischemic changes are also present within the bilateral deep gray nuclei. Additionally, mild chronic small vessel ischemic changes are present within the pons. There is mild chronic hemosiderin deposition along the right parietooccipital lobes, suggesting remote subarachnoid hemorrhage at this site. Additionally, there are clustered chronic parenchymal microhemorrhages within the right parietal and occipital lobes. There are a few additional punctate chronic microhemorrhages elsewhere within the supratentorial and infratentorial brain. No evidence of an  intracranial mass. No extra-axial fluid collection. No midline shift. Vascular: Maintained flow voids within the proximal large arterial vessels. Skull and upper cervical spine: No focal suspicious marrow lesion. Mild C3-C4 grade 1 retrolisthesis. Incompletely assessed cervical spondylosis. Sinuses/Orbits: Visualized orbits show no acute finding. Bilateral ocular lens replacements. 18 mm mucous retention cyst within the right maxillary sinus. Trace mucosal thickening within the bilateral ethmoid sinuses. IMPRESSION: Small patchy acute cortical/subcortical infarcts within the left parietal lobe. Background moderate to moderately advanced chronic small vessel ischemic changes within the cerebral white matter. To a lesser degree, chronic small vessel ischemic changes are also present within the bilateral deep gray nuclei and within the pons. Mild chronic hemosiderin deposition along the right parietooccipital lobes, suggesting remote subarachnoid hemorrhage at this site. Additionally, clustered chronic parenchymal microhemorrhages are present within the right parietooccipital lobes. Findings are nonspecific, but may reflect manifestations of cerebral amyloid angiopathy. Mild generalized parenchymal atrophy. Paranasal sinus disease, as described. Electronically Signed   By: Kellie Simmering D.O.   On: 04/23/2021 19:38   ECHOCARDIOGRAM COMPLETE  Result Date: 04/24/2021    ECHOCARDIOGRAM REPORT   Patient Name:   Rita Yoder Date of Exam: 04/24/2021 Medical Rec #:  [id number]          Height:       61.0 in Accession #:    [id number]         Weight:       214.3 lb Date of Birth:  04-05-1940          BSA:          1.945 m Patient Age:    76 years           BP:           161/110 mmHg Patient Gender: F                  HR:           118 bpm. Exam Location:  Inpatient Procedure: 2D Echo Indications:    Chest pain  History:        Patient has no prior history of Echocardiogram examinations.                 Risk  Factors:Hypertension.  Sonographer:    Arlyss Gandy Referring Phys: [id number] [place] Comments: Image acquisition challenging due to patient  body habitus. IMPRESSIONS  1. Left ventricular ejection fraction, by estimation, is 60 to 65%. The left ventricle has normal function. The left ventricle has no regional wall motion abnormalities. There is mild left ventricular hypertrophy. Left ventricular diastolic parameters are consistent with Grade I diastolic dysfunction (impaired relaxation).  2. Right ventricular systolic function is normal. The right ventricular size is normal. Tricuspid regurgitation signal is inadequate for assessing PA pressure.  3. Left atrial size was mild to moderately dilated.  4. The mitral valve is normal in structure. No evidence of mitral valve regurgitation. No evidence of mitral stenosis.  5. The aortic valve is tricuspid. Aortic valve regurgitation is not visualized. Aortic valve sclerosis/calcification is present, without any evidence of aortic stenosis.  6. The inferior vena cava is normal in size with greater than 50% respiratory variability, suggesting right atrial pressure of 3 mmHg. FINDINGS  Left Ventricle: Left ventricular ejection fraction, by estimation, is 60 to 65%. The left ventricle has normal function. The left ventricle has no regional wall motion abnormalities. The left ventricular internal cavity size was normal in size. There is  mild left ventricular hypertrophy. Left ventricular diastolic parameters are consistent with Grade I diastolic dysfunction (impaired relaxation). Right Ventricle: The right ventricular size is normal. No increase in right ventricular wall thickness. Right ventricular systolic function is normal. Tricuspid regurgitation signal is inadequate for assessing PA pressure. Left Atrium: Left atrial size was mild to moderately dilated. Right Atrium: Right atrial size was normal in size. Pericardium: There is no evidence of pericardial  effusion. Mitral Valve: The mitral valve is normal in structure. Mild mitral annular calcification. No evidence of mitral valve regurgitation. No evidence of mitral valve stenosis. Tricuspid Valve: The tricuspid valve is normal in structure. Tricuspid valve regurgitation is not demonstrated. Aortic Valve: The aortic valve is tricuspid. Aortic valve regurgitation is not visualized. Aortic valve sclerosis/calcification is present, without any evidence of aortic stenosis. Aortic valve mean gradient measures 5.0 mmHg. Aortic valve peak gradient measures 10.2 mmHg. Aortic valve area, by VTI measures 2.70 cm. Pulmonic Valve: The pulmonic valve was normal in structure. Pulmonic valve regurgitation is not visualized. Aorta: The aortic root is normal in size and structure. Venous: The inferior vena cava is normal in size with greater than 50% respiratory variability, suggesting right atrial pressure of 3 mmHg. IAS/Shunts: No atrial level shunt detected by color flow Doppler.  LEFT VENTRICLE PLAX 2D LVIDd:         4.36 cm   Diastology LVIDs:         2.80 cm   LV e' medial:    4.35 cm/s LV PW:         1.27 cm   LV E/e' medial:  17.1 LV IVS:        1.31 cm   LV e' lateral:   4.68 cm/s LVOT diam:     2.00 cm   LV E/e' lateral: 15.9 LV SV:         100 LV SV Index:   52 LVOT Area:     3.14 cm  RIGHT VENTRICLE RV Basal diam:  3.70 cm RV Mid diam:    2.95 cm RV S prime:     10.30 cm/s TAPSE (M-mode): 2.2 cm LEFT ATRIUM             Index        RIGHT ATRIUM           Index LA diam:  3.70 cm 1.90 cm/m   RA Area:     17.50 cm 9.00 cm/m LA Vol (A2C):   53.2 ml 27.35 ml/m LA Vol (A4C):   78.0 ml 40.10 ml/m LA Biplane Vol: 67.8 ml 34.86 ml/m  AORTIC VALVE AV Area (Vmax):    2.30 cm AV Area (Vmean):   2.36 cm AV Area (VTI):     2.70 cm AV Vmax:           160.00 cm/s AV Vmean:          107.000 cm/s AV VTI:            0.371 m AV Peak Grad:      10.2 mmHg AV Mean Grad:      5.0 mmHg LVOT Vmax:         117.00 cm/s LVOT Vmean:         80.400 cm/s LVOT VTI:          0.319 m LVOT/AV VTI ratio: 0.86  AORTA Ao Root diam: 2.80 cm Ao Asc diam:  3.10 cm MITRAL VALVE MV Area (PHT): 1.94 cm    SHUNTS MV Decel Time: 391 msec    Systemic VTI:  0.32 m MV E velocity: 74.60 cm/s  Systemic Diam: 2.00 cm MV A velocity: 81.00 cm/s MV E/A ratio:  0.92 Dalton McleanMD Electronically signed by Franki Monte Signature Date/Time: 04/24/2021/4:11:07 PM    Final    CT HEAD CODE STROKE WO CONTRAST  Result Date: 04/23/2021 CLINICAL DATA:  Code stroke. Acute neuro deficit. Left-sided gaze. Difficulty speaking. EXAM: CT HEAD WITHOUT CONTRAST TECHNIQUE: Contiguous axial images were obtained from the base of the skull through the vertex without intravenous contrast. RADIATION DOSE REDUCTION: This exam was performed according to the departmental dose-optimization program which includes automated exposure control, adjustment of the mA and/or kV according to patient size and/or use of iterative reconstruction technique. COMPARISON:  None. FINDINGS: Brain: Mild atrophy. Moderate white matter hypodensity bilaterally. This is symmetric and most likely chronic microvascular ischemia. Negative for acute hemorrhage or mass. 8 mm hypodensity left thalamus compatible with infarct of indeterminate age. Vascular: Negative for hyperdense vessel Skull: Negative Sinuses/Orbits: Retention cyst right maxillary sinus. Remaining sinuses clear. Bilateral cataract extraction Other: None ASPECTS ([place] Stroke Program Early CT Score) - Ganglionic level infarction (caudate, lentiform nuclei, internal capsule, insula, M1-M3 cortex): 7 - Supraganglionic infarction (M4-M6 cortex): 3 Total score (0-10 with 10 being normal): 10 IMPRESSION: 1. 8 mm hypodensity left thalamus compatible with infarct of indeterminate age. Possibly acute. No acute hemorrhage. 2. ASPECTS is 10 3. Atrophy and moderate chronic microvascular ischemic change. 4. Code stroke imaging results were communicated on  04/23/2021 at 4:39 pm to provider Palikh via text page Electronically Signed   By: Franchot Gallo M.D.   On: 04/23/2021 16:41    Labs:  Basic Metabolic Panel: Recent Labs  Lab 05/18/21 0954 05/19/21 0652 05/20/21 0509  NA 140 136 138  K 5.4* 4.3 4.7  CL 113* 112* 112*  CO2 16* 16* 19*  GLUCOSE 159* 95 84  BUN 45* 43* 46*  CREATININE 3.22* 3.18* 3.24*  CALCIUM 9.5 8.8* 9.0    CBC: Recent Labs  Lab 05/18/21 0954 05/20/21 0509  WBC 9.1 9.6  HGB 10.0* 9.5*  HCT 32.0* 29.3*  MCV 102.9* 100.7*  PLT 229 180    CBG: Recent Labs  Lab 05/18/21 0931  GLUCAP 163*   Family history.  Brother with myocardial infarction.  Maternal aunt with cancer of unknown.  Denies any colon cancer esophageal cancer or rectal cancer  Brief HPI:   Rita Yoder is a 81 y.o. right-handed female with history of nonobstructive CAD hypertension hypothyroidism type 2 diabetes mellitus hyperlipidemia normocytic anemia with monoclonal gammopathy followed by Dr.Kale, CKD stage III.  Per chart review independent prior to admission occasional use of a cane.  Presented 04/23/2021 with acute onset of right-sided weakness and word finding difficulties.  Cranial CT scan showed 8 mm hypodensity left thalamus compatible with infarction of indeterminate age.  No acute hemorrhage.  MRA head and neck no intracranial large vessel occlusion identified.  Multiple sites of moderate severe stenosis within P2 left PCA.  Severe stenosis within the mid to distal M1 left MCA.  MRI follow-up small patchy acute cortical subcortical infarct within the left parietal lobe.  Background moderate to moderately advanced chronic small vessel ischemic changes within the cerebral white matter additionally, clustered chronic parenchymal microhemorrhages present within the right parietal occipital lobe.  Patient did not receive tPA.  Admission chemistries unremarkable except glucose 160 BUN 49 creatinine 3.13 alcohol negative hemoglobin A1c 4.5  TSH 5.275.  Echocardiogram ejection fraction of 60 to 65% no wall motion abnormalities grade 1 diastolic dysfunction.  Neurology follow-up due to concerns for microhemorrhages DAPT not recommended initially placed on aspirin.  Patient did develop some right hand twitching concerning for focal seizure loaded with Keppra with resolution of tremor.  EEG not performed due to glued wig in place.  Hospital course PAF with RVR of new onset received IV metoprolol subsequently had episode of bradycardia with heart rate in the 50s with cardiology service consulted.  She was cleared to begin Eliquis for both atrial fibrillation and CVA prophylaxis.  Cardiac rate remained controlled maintained on amiodarone.  AKI on CKD baseline creatinine 2.8 her HCTZ was held.  Tolerating a regular diet.  Therapy evaluations completed due to patient decreased functional mobility right-sided weakness was admitted for a comprehensive rehab program.   Hospital Course: Rita Yoder was admitted to rehab 05/02/2021 for inpatient therapies to consist of PT, ST and OT at least three hours five days a week. Past admission physiatrist, therapy team and rehab RN have worked together to provide customized collaborative inpatient rehab.  Pertaining to patient's acute multifocal infarct including left ischemic thalamus left parietal cortical subcortical infarct remained stable follow-up neurology services maintained on Eliquis for both CVA prophylax as well as new onset atrial fibrillation cardiac rate controlled.  She continued on amiodarone 100 mg daily monitoring of heart rate.  No chest pain or shortness of breath.  Hospital course suspicion for focal seizure Keppra 500 twice daily EEG not completed due to glued wig cannot be removed follow-up neurology services.  AKI on CKD HCTZ held she did receive a 500 mL fluid bolus with latest creatinine 3.24 felt to be baseline.  History of hyperlipidemia Lipitor ongoing.  Synthroid for hypothyroidism.   History of normocytic anemia monoclonal gammopathy followed outpatient by hematology services.  Blood sugars overall controlled type 2 diabetes mellitus hemoglobin A1c 4.5.     Blood pressures were monitored on TID basis and soft and controlled  Diabetes has been monitored with ac/hs CBG checks and SSI was use prn for tighter BS control.    Rehab course: During patient's stay in rehab weekly team conferences were held to monitor patient's progress, set goals and discuss barriers to discharge. At admission, patient required +2 physical assist sit to stand moderate assist sit to supine mod max assist step  pivot transfers  Physical exam.  Blood pressure 161/56 pulse 61 temperature 97.6 respirations 20 oxygen saturations 100% room air Constitutional.  No acute distress HEENT Head.  Normocephalic and atraumatic Eyes.  Pupils round and reactive to light no discharge without nystagmus Neck.  Supple nontender no JVD without thyromegaly Cardiac regular rate rhythm no extra sounds or murmur heard Abdomen.  Soft nontender positive bowel sounds without rebound Respiratory effort normal no respiratory distress without wheeze Musculoskeletal. Comments.  Right upper extremity 4 -/5 but hard to test due to right side neglect Right lower extremity 3+/5 again neglect makes it difficult to test Left upper extremity/left lower extremity 5 -/5 Neurologic.  Alert right side neglect she was aphasic but could utter some simple words.  Left gaze preference.   He/She  has had improvement in activity tolerance, balance, postural control as well as ability to compensate for deficits. He/She has had improvement in functional use RUE/LUE  and RLE/LLE as well as improvement in awareness.  Supine to sit with bed features cues for positioning.  Patient performed sit to stand with cues for hand placement and initiation with contact-guard.  Stand step transfer to wheelchair contact-guard and cues for positioning.  Ambulates  75-100 feet rolling walker contact-guard.  Bed mobility min mod for supine to sit and scoot up in bed with assistance.  SLP follow-up patient made functional gains with aphasia verbally expressing basic wants and needs.  Minimal verbal cues for orientation and sustained attention.  Full family teaching completed plan discharged to home       Disposition: Discharge to home    Diet: Regular  Special Instructions: No driving smoking or alcohol  Medications at discharge 1.  Tylenol as needed 2.  Amiodarone 100 mg p.o. daily 3.  Norvasc 10 mg p.o. daily 4.  Eliquis 2.5 mg p.o. twice daily 5.  Lipitor 40 mg p.o. daily 6.  Enablex 7.5 mg p.o. daily 7.  Keppra 500 mg p.o. twice daily 8.  Synthroid 25 mcg p.o. daily 9.  MiraLAX twice daily hold for loose stools 10.  Imdur 60 mg daily  30-35 minutes were spent completing discharge summary and discharge planning  Discharge Instructions     Ambulatory referral to Neurology   Complete by: As directed    An appointment is requested in approximately: 4 weeks left ischemic thalamus, left parietal cortical and subcortical infarct   Ambulatory referral to Physical Medicine Rehab   Complete by: As directed    Moderate complexity follow-up 1 to 2 weeks left ischemic thalamus left parietal, cortical CVA        Follow-up Information     Meredith Staggers, MD Follow up.   Specialty: Physical Medicine and Rehabilitation Why: Office to call for appointment Contact information: [address] [place] [address] [phone]         Charolette Forward, MD Follow up.   Specialty: Cardiology Why: Call for appointment Contact information: [address] [place] [address] [phone]         Brunetta Genera, MD Follow up.   Specialties: Hematology, Oncology Why: Call for appointment Contact information: [place] [address] [phone]                  Signed: Lavon Pagan[place] 05/20/2021, 7:05 AM

## 2021-05-20 NOTE — Progress Notes (Signed)
?                                                       PROGRESS NOTE ? ? ?Subjective/Complaints: ?Pt with dizziness again this morning with PT. However BP sl elevated and HR was recorded at 165 in standing. Was fine when I saw her in bed prior.  ? ?ROS: Patient denies fever, rash, sore throat, blurred vision,  nausea, vomiting, diarrhea, cough, shortness of breath or chest pain, joint or back/neck pain, headache, or mood change.  ? ? ? ?Objective: ?  ?No results found. ?Recent Labs  ?  05/18/21 ?0954 05/20/21 ?7425  ?WBC 9.1 9.6  ?HGB 10.0* 9.5*  ?HCT 32.0* 29.3*  ?PLT 229 180  ? ? ? ?Recent Labs  ?  05/19/21 ?0652 05/20/21 ?9563  ?NA 136 138  ?K 4.3 4.7  ?CL 112* 112*  ?CO2 16* 19*  ?GLUCOSE 95 84  ?BUN 43* 46*  ?CREATININE 3.18* 3.24*  ?CALCIUM 8.8* 9.0  ? ? ? ?Intake/Output Summary (Last 24 hours) at 05/20/2021 1055 ?Last data filed at 05/20/2021 0900 ?Gross per 24 hour  ?Intake 538 ml  ?Output --  ?Net 538 ml  ?  ? ?  ? ?Physical Exam: ?Vital Signs ?Blood pressure 126/75, pulse 80, temperature 98.1 ?F (36.7 ?C), resp. rate 16, height '5\' 6"'$  (1.676 m), weight 95.8 kg, SpO2 100 %. ? ?Constitutional: No distress . Vital signs reviewed. ?HEENT: NCAT, EOMI, oral membranes moist ?Neck: supple ?Cardiovascular: RRR without murmur. No JVD    ?Respiratory/Chest: CTA Bilaterally without wheezes or rales. Normal effort    ?GI/Abdomen: BS +, non-tender, non-distended ?Ext: no clubbing, cyanosis, or edema ?Psych: pleasant and cooperative  ?Skin: No evidence of breakdown, no evidence of rash ?Musculoskeletal:  ?   moving all 4 limbs. No pain today ?Neuro:  ?Very Alert and Oriented to person, place, stroke. Right central 7, speech clear..   RUE 4- to 4+/5. RLE 3-4/5 LUE/LLE 4+/5. Decreased light touch on right .  Decreased LT,  stocking glove sensory loss  below knees bilaterally. DTR's 1+ ?  ?  ?Assessment/Plan: ?1. Functional deficits which require 3+ hours per day of interdisciplinary therapy in a comprehensive inpatient  rehab setting. ?Physiatrist is providing close team supervision and 24 hour management of active medical problems listed below. ?Physiatrist and rehab team continue to assess barriers to discharge/monitor patient progress toward functional and medical goals ? ?Care Tool: ? ?Bathing ?   ?   ? Body parts bathed by helper: Right arm, Left arm, Chest, Abdomen, Front perineal area, Buttocks, Right upper leg, Left upper leg, Left lower leg, Right lower leg, Face ?  ?  ?Bathing assist Assist Level: 2 Helpers ?  ?  ?Upper Body Dressing/Undressing ?Upper body dressing   ?What is the patient wearing?: [place] only ?   ?Upper body assist Assist Level: 2 Helpers ?   ?Lower Body Dressing/Undressing ?Lower body dressing ? ? ?   ?What is the patient wearing?: Incontinence brief ? ?  ? ?Lower body assist Assist for lower body dressing: 2 Helpers ?   ? ?Toileting ?Toileting    ?Toileting assist Assist for toileting: 2 Helpers ?  ?  ?Transfers ?Chair/bed transfer ? ?Transfers assist ? Chair/bed transfer activity did not occur: Safety/medical concerns ? ?Chair/bed transfer assist level: Minimal Assistance -  Patient > 75% ?Chair/bed transfer assistive device: Walker ?  ?Locomotion ?Ambulation ? ? ?Ambulation assist ? ? Ambulation activity did not occur: Safety/medical concerns ? ?Assist level: Minimal Assistance - Patient > 75% ?Assistive device: Walker-rolling ?Max distance: 25 ft  ? ?Walk 10 feet activity ? ? ?Assist ? Walk 10 feet activity did not occur: Safety/medical concerns ? ?Assist level: Minimal Assistance - Patient > 75% ?Assistive device: Walker-rolling  ? ?Walk 50 feet activity ? ? ?Assist Walk 50 feet with 2 turns activity did not occur: Safety/medical concerns ? ?  ?   ? ? ?Walk 150 feet activity ? ? ?Assist Walk 150 feet activity did not occur: Safety/medical concerns ? ?  ?  ?  ? ?Walk 10 feet on uneven surface  ?activity ? ? ?Assist Walk 10 feet on uneven surfaces activity did not occur: Safety/medical  concerns ? ? ?  ?   ? ?Wheelchair ? ? ? ? ?Assist Is the patient using a wheelchair?: Yes ?Type of Wheelchair: Manual ?Wheelchair activity did not occur: Safety/medical concerns ? ?  ?   ? ? ?Wheelchair 50 feet with 2 turns activity ? ? ? ?Assist ? ?  ?Wheelchair 50 feet with 2 turns activity did not occur: Safety/medical concerns ? ? ?   ? ?Wheelchair 150 feet activity  ? ? ? ?Assist ? Wheelchair 150 feet activity did not occur: Safety/medical concerns ? ? ?   ? ?Blood pressure 126/75, pulse 80, temperature 98.1 ?F (36.7 ?C), resp. rate 16, height '5\' 6"'$  (1.676 m), weight 95.8 kg, SpO2 100 %. ? ?Medical Problem List and Plan: ?1. Functional deficits secondary to acute multifocal infarct including left ischemic thalamus, left parietal cortical and subcortical infarcts ?      -Continue CIR therapies including PT, OT, and SLP  ?2.  Antithrombotics: ?-DVT/anticoagulation:  Pharmaceutical: Other (comment) Eliquis ?            -antiplatelet therapy: N/A ?3. Pain/left shoulder girdle pain: continue kpad ? -muscle rub for shoulder girdle has helped ?  -pain improved ?4. Mood: Provide emotional support ?            -antipsychotic agents: N/A ?5. Neuropsych: This patient is not capable of making decisions on her own behalf. ?6. Skin/Wound Care: Routine skin checks ?7. Fluids/Electrolytes/Nutrition: Routine in and outs ?8.  New onset atrial fibrillation with RVR.  Amiodarone 100 mg daily.  Follow-up cardiology services.    ? 3/13 HR elevated and reported irregular in standing this morning per PT to 165. Not sure how accurate that is given it was recorded by machine. Pt definitely symptomatic, very dizzy. ? -check EKG today ? -re-consult Cardiology given she has had baseline bradycardia ? -continue current norvasc ?9.  Hypertension.  Norvasc 10 mg daily, Imdur 60 mg daily.    ? -HCTZ still on hold d/t #11 ? 3/11: controlled with norvasc and imdur ?Vitals:  ? 05/19/21 1948 05/20/21 0448  ?BP: 140/74 126/75  ?Pulse: (!) 101 80   ?Resp: 16 16  ?Temp: 98.6 ?F (37 ?C) 98.1 ?F (36.7 ?C)  ?SpO2: 99% 100%  ?Consider reduction of amlodipine dose although supine BPs are good ? ?10.  Suspicion for focal seizure.  Continue Keppra 500 mg twice daily.  EEG not completed due to glued wig could not be removed. ?11.  AKI on CKD stage IIIB- GFR 20.  Marland Kitchen  HCTZ on hold. ? 3/13 BUN/Cr improved--near baseline ?   ?BMP Latest Ref Rng & Units 05/20/2021 05/19/2021 05/18/2021  ?  Glucose 70 - 99 mg/dL 84 95 159(H)  ?BUN 8 - 23 mg/dL 46(H) 43(H) 45(H)  ?Creatinine 0.44 - 1.00 mg/dL 3.24(H) 3.18(H) 3.22(H)  ?BUN/Creat Ratio 12 - 28 - - -  ?Sodium 135 - 145 mmol/L 138 136 140  ?Potassium 3.5 - 5.1 mmol/L 4.7 4.3 5.4(H)  ?Chloride 98 - 111 mmol/L 112(H) 112(H) 113(H)  ?CO2 22 - 32 mmol/L 19(L) 16(L) 16(L)  ?Calcium 8.9 - 10.3 mg/dL 9.0 8.8(L) 9.5  ?  ? ?12.  Hyperlipidemia.  Continue Lipitor ?13.  Hypothyroidism.  Synthroid ?14.  Normocytic anemia/monoclonal gammopathy.  Follow-up hematology services Dr.Kale ?15.  Constipation.  MiraLAX twice daily.  Senokot daily. ?16.  Suspect UTI.  treated. ?17.Type 2 diabetes mellitus.HG  AIC 4.5    ?18. Transaminitis: tylenol d/ced ?19. Leukocytosis:    ?  Resolved, 9.1 3/6 ? ?LOS: ?18 days ?A FACE TO FACE EVALUATION WAS PERFORMED ? ?Meredith Staggers ?05/20/2021, 10:55 AM  ? ?   ?

## 2021-05-20 NOTE — Progress Notes (Signed)
Occupational Therapy Session Note ? ?Patient Details  ?Name: Rita Yoder ?MRN: [id number] ?Date of Birth: 01-04-1941 ? ?Today's Date: 05/20/2021 ?OT Individual Time: 1332-1410 ?OT Individual Time Calculation (min): 38 min  ? ? ?Short Term Goals: ?Week 3:  OT Short Term Goal 1 (Week 3): STG=LTG d/t ELOS ? ?Skilled Therapeutic Interventions/Progress Updates:  ?  Pt supine with her son [name] present. Discussed family edu tomorrow and equipment recommendations for home. Pt agreeable to try out regular BSC. She transferred to EOB with (S) using bed rail. She completed a sit > stand with (S) from EOB. CGA stand pivot transfer to the [facility] with the RW. She completed clothing management with min A. She required mod A for peri hygiene d/t poor motor planning and bringing stool forward into vagina. Provided edu on UTI prevention. Pt completed clothing management with min A. She returned to EOB and was left supine with all needs met, bed alarm set.  ? ?Therapy Documentation ?Precautions:  ?Precautions ?Precautions: Fall ?Precaution Comments: Rt hemiparesis and Rt inattention ?Restrictions ?Weight Bearing Restrictions: No ? ? ?Therapy/Group: Individual Therapy ? ?Curtis Sites ?05/20/2021, 6:28 AM ?

## 2021-05-20 NOTE — Progress Notes (Signed)
Occupational Therapy Session Note ? ?Patient Details  ?Name: Rita Yoder ?MRN: [id number] ?Date of Birth: 22-Mar-1940 ? ?Today's Date: 05/20/2021 ?OT Individual Time: 1484-0397 ?OT Individual Time Calculation (min): 10 min  and Today's Date: 05/20/2021 ?OT Missed Time: 35 Minutes ?Missed Time Reason: Patient fatigue;Other (comment) (decline d/t dizziness at EOB) ? ? ?Short Term Goals: ?Week 2:  OT Short Term Goal 1 (Week 2): Pt will transfer to [facility] wiht +1 A and LRAD to decrease BOC ?OT Short Term Goal 1 - Progress (Week 2): Met ?OT Short Term Goal 2 (Week 2): Pt will complete 1/3 components of toileting with MIN A for balance ?OT Short Term Goal 2 - Progress (Week 2): Progressing toward goal ?OT Short Term Goal 3 (Week 2): pt will thread 1LE into pants with no  more than CGA for sitting balance ?OT Short Term Goal 3 - Progress (Week 2): Met ? ?Skilled Therapeutic Interventions/Progress Updates:  ?   ?Pt received in bed with no pain. ? ?Therapeutic activity ?Pt encouraged to get to EOB, however pt continues to state, "I dont know todays a bad day." Pt continues to decline EOB, but eventually agreeable to get up in next session with next OT. Vitals assessed 69 HR, 100% O2 and 131/59 BP. OT applies ace wraps to BLE for BP support/circulation. ? ?Pt left at end of session in bed with exit alarm on, call light in reach and all needs met ? ?Therapy Documentation ?Precautions:  ?Precautions ?Precautions: Fall ?Precaution Comments: Rt hemiparesis and Rt inattention ?Restrictions ?Weight Bearing Restrictions: No ? ? ?Therapy/Group: Individual Therapy ? ?Lowella Dell Keierra Nudo ?05/20/2021, 7:37 AM ?

## 2021-05-20 NOTE — Progress Notes (Signed)
Physical Therapy Weekly Progress Note ? ?Patient Details  ?Name: Rita Yoder ?MRN: [id number] ?Date of Birth: 02-26-1941 ? ?Beginning of progress report period: May 12, 2021 ?End of progress report period: May 20, 2021 ? ?Today's Date: 05/20/2021 ?PT Individual Time: 7001-7494 ?PT Individual Time Calculation (min): 45 min  ? ?Patient has met 3 of 3 short term goals.  Patient with excellent progress this week! Progressed to performing bed mobility with supervision-min A with use of bed rail, recommended family purchase a bed rail for increased independence with mobility at home, min A-CGA with sit to stand and stand pivot transfers using a RW, and ambulating up to 100 feet. Patient with syncopal episode over the weekend and symptomatic with mobility with labile vitals today, see below.  ? ?Patient continues to demonstrate the following deficits decreased cardiorespiratoy endurance, abnormal tone, decreased coordination, and decreased motor planning, decreased attention to right, decreased attention, decreased awareness, decreased problem solving, decreased safety awareness, and decreased memory, and decreased sitting balance, decreased standing balance, decreased postural control, hemiplegia, and decreased balance strategies and therefore will continue to benefit from skilled PT intervention to increase functional independence with mobility. ? ?Patient progressing toward long term goals..  Continue plan of care. ? ?PT Short Term Goals ?Week 2:  PT Short Term Goal 1 (Week 2): Patietn will perform bed mobility with min A using hospital bed features >75% of the time. ?PT Short Term Goal 1 - Progress (Week 2): Met ?PT Short Term Goal 2 (Week 2): Patient will perform basic transfer with min A using LRAD consistently. ?PT Short Term Goal 2 - Progress (Week 2): Met ?PT Short Term Goal 3 (Week 2): Patient will ambulate >25 feet using LRAD with min A. ?PT Short Term Goal 3 - Progress (Week 2): Met ?Week 3:  PT Short  Term Goal 1 (Week 3): STG=LTG due to ELOS. ? ?Skilled Therapeutic Interventions/Progress Updates:  ?   ?Patient in bed upon PT arrival. Patient alert and agreeable to PT session. Patient denied pain during session. ? ?Patient with reports of dizziness when standing during session. ?Orthostatic Vitals: ?Supine: BP 121/76, HR 50  ?Sitting: BP 123/83, HR 71 ?Standing: BP 130/100, HR 165 ?Manual HR in supine: 67 (irregular rhythm)  ? ?Significant change in activity tolerance, limited by dizziness and variable HR, as patient was walking 100 feet at end of last week. LPN, MD, and therapy team made aware.  ? ?Therapeutic Activity: ?Bed Mobility: Patient performed supine to/from sit with use of bed rail in a flat bed to simulate home set-up with min A for lower extremity management onto the bed. Provided verbal cues for sequencing and use of R lower extremity. ?Patient sat EOB and doffed gown and donned a sweatshirt with min A. Threaded lower extremities through pants with total A due to symptoms of dizziness in sitting on second time sitting EOB. ?Transfers: Patient performed sit to/from stand x2 with CGA-min A using RW. Provided verbal cues for hand placement and forward weight shift. Patient reports significant dizziness in standing, tolerating <30 sec both trials and requesting to return to lying due to symptoms.  ? ?Patient in bed at end of session with breaks locked, bed alarm set, and all needs within reach. Patient missed 15 min of skilled PT due to fatigue/vitals, RN made aware. Will attempt to make-up missed time as able.   ? ?Therapy Documentation ?Precautions:  ?Precautions ?Precautions: Fall ?Precaution Comments: Rt hemiparesis and Rt inattention ?Restrictions ?Weight Bearing Restrictions: No ? ? ?  Therapy/Group: Individual Therapy ? ?Doreene Burke PT, DPT ? ?05/20/2021, 10:43 AM  ?

## 2021-05-20 NOTE — Telephone Encounter (Signed)
Mr. Steinhilber the pt's son said he was returning a call to the office about a question that was needed to be asked. He was told to disregard the call and that the letter has been completed and faxed this morning. ?

## 2021-05-21 MED ORDER — AMIODARONE HCL 200 MG PO TABS
200.0000 mg | ORAL_TABLET | Freq: Every day | ORAL | Status: DC
  Administered 2021-05-22 – 2021-05-24 (×3): 200 mg via ORAL
  Filled 2021-05-21 (×3): qty 1

## 2021-05-21 NOTE — Patient Care Conference (Signed)
Inpatient RehabilitationTeam Conference and Plan of Care Update ?Date: 05/21/2021   Time: 10:10 AM  ? ? ?Patient Name: Rita Yoder      ?Medical Record Number: [id number]  ?Date of Birth: October 12, 1940 ?Sex: Female         ?Room/Bed: 4M76K/0S81J-03 ?Payor Info: Payor: [employer] / Plan: [facility] MEDICARE / Product Type: *No Product type* /   ? ?Admit Date/Time:  05/02/2021 12:24 PM ? ?Primary Diagnosis:  Subcortical infarction ([place]) ? ?Hospital Problems: Principal Problem: ?  Subcortical infarction [facility]) ?Active Problems: ?  AKI (acute kidney injury) ([place]) ?  Right-sided visual neglect ?  Hyperglycemia ? ? ? ?Expected Discharge Date: Expected Discharge Date: 05/24/21 ? ?Team Members Present: ?Physician leading conference: Dr. Alger Simons ?Social Worker Present: Loralee Pacas, LCSWA ?Nurse Present: Dorthula Nettles, RN ?PT Present: Apolinar Junes, PT ?OT Present: Laverle Hobby, OT ?SLP Present: Weston Anna, SLP ?PPS Coordinator present : Gunnar Fusi, SLP ? ?   Current Status/Progress Goal Weekly Team Focus  ?Bowel/Bladder ? ? Incontinent X2  regain continence  toilet q 2 hr and prn   ?Swallow/Nutrition/ Hydration ? ?           ?ADL's ? ? Still needs cueing/assist for motor planning and R attention but much improved, CGA transfers and min A ADLs overall. Very poor visual perceptual skills in novel situations  min A overall  motor planning, cognitive retraining, visual perception, RUE NMR, ADL retraining, family edu   ?Mobility ? ? Min A bed mobility, min A-CGA transfers and gait up to 100 ft, limited by dizziness with irregular HR on 3/13  Min A overall, gait 50 ft  R hemi-body attention and NMR, activity tolerance, functional mobility, gait training, patient/caregiver education and d/c planning   ?Communication ? ? Supervision-Min A  Min A  use of word-finding strategies, verbal expression at sentence level   ?Safety/Cognition/ Behavioral Observations ? Mod A  Min A  orientation, awareness,  basic problem solving, visual scanning   ?Pain ? ? denies pain  remain pain free  assess pain q 4hr and prn   ?Skin ? ? no skin issues  no new breakdown or infection  assess skin q shift and prn   ? ? ?Discharge Planning:  ?Return with her dtr [name] who works from home, and pt will continue to have support from all children who will alternate their schedule to make sure her care needs are met. Fam edu scheduled for Tues (3/14) 1pm-3pm.   ?Team Discussion: ?Reports dizziness. Episode on Saturday, received fluids. Episode on Monday, A-fib. Cardio increased Amiodarone. Incontinent at times. No pain reported, sleeping well. Set-up with HH, family education today.  ? ?Patient on target to meet rehab goals: ?yes, min assist goals. Currently requires cueing assist for motor planning. CGA overall.  ?Will need a lot of assist with toileting at discharge. CGA mobility. Will need WC & RW. Discussing hospital bed versus adding rails to current bed. Word finding improving, memory improving. ? ?*See Care Plan and progress notes for long and short-term goals.  ? ?Revisions to Treatment Plan:  ?Adjusting medications ?  ?Teaching Needs: ?Family education, medication management, bowel/bladder management, transfer/gait training, etc. ?  ?Current Barriers to Discharge: ?Incontinence and dizziness ? ?Possible Resolutions to Barriers: ?Family education ?Teach time toileting schedule  ?Cardiology follow-up ?  ? ? Medical Summary ?Current Status: improving stroke symptoms. vagaled on saturday---fluid given. a fib with rvr yesterday. amiodarone increased ? Barriers to Discharge: Medical stability ?  ?Possible  Resolutions to [employer]: pushing fluids. daily ekg's, rate/rhythm rx, cardiology follow up ? ? ?Continued Need for Acute Rehabilitation Level of Care: The patient requires daily medical management by a physician with specialized training in physical medicine and rehabilitation for the following reasons: ?Direction of a  multidisciplinary physical rehabilitation program to maximize functional independence : Yes ?Medical management of patient stability for increased activity during participation in an intensive rehabilitation regime.: Yes ?Analysis of laboratory values and/or radiology reports with any subsequent need for medication adjustment and/or medical intervention. : Yes ? ? ?I attest that I was present, lead the team conference, and concur with the assessment and plan of the team. ? ? ?Dorthula Nettles G ?05/21/2021, 2:06 PM  ? ? ? ? ? ? ?

## 2021-05-21 NOTE — Progress Notes (Signed)
Occupational Therapy Session Note ? ?Patient Details  ?Name: Rita Yoder ?MRN: [id number] ?Date of Birth: 1940-05-22 ? ?Today's Date: 05/21/2021 ?OT Individual Time: 1005-1100 ?OT Individual Time Calculation (min): 55 min  ? ? ?Short Term Goals: ?Week 1:  OT Short Term Goal 1 (Week 1): Pt will complete BSC trasnfer with +2 assist and LRAD ?OT Short Term Goal 1 - Progress (Week 1): Met ?OT Short Term Goal 2 (Week 1): Pt will complete LB dressing at sit<stand level with LRAD ?OT Short Term Goal 2 - Progress (Week 1): Progressing toward goal ?OT Short Term Goal 3 (Week 1): Pt will complete 1 component of UB dressing with Max A ?OT Short Term Goal 3 - Progress (Week 1): Met ? ?Skilled Therapeutic Interventions/Progress Updates:  ?   ?Pt received in bed with no pain reported. ?ADL: ?Pt completes ADL at overall MIN Level. Skilled interventions include: skilled monitoring of HR with no signs of A-fib with mobility this date pt changes shirt EOB with MIN A overall for orientation of shirt and tactile cuing for R inattention. Pants seem clean and pt reports changing them this morning. Pt completes SPT to w/c with RW and CGA. Tp eats with OT setting up all foor items/silerware R of center requiring MOD A for head turns and visual scanning for R attention  ?Pt practices standard recliner transfer with CGA for mobility to recliner and MIN A for power up from low rocking recliner to simulate home environment.  ? ?Pt left at end of session in bed with exit alarm on, call light in reach and all needs met ? ? ?Therapy Documentation ?Precautions:  ?Precautions ?Precautions: Fall ?Precaution Comments: Rt hemiparesis and Rt inattention ?Restrictions ?Weight Bearing Restrictions: No ? ? ?Therapy/Group: Individual Therapy ? ?Lowella Dell Fowler Antos ?05/21/2021, 6:50 AM ?

## 2021-05-21 NOTE — Progress Notes (Signed)
Speech Language Pathology Daily Session Note ? ?Patient Details  ?Name: Rita Yoder ?MRN: [id number] ?Date of Birth: 1940/06/25 ? ?Today's Date: 05/21/2021 ?SLP Individual Time: 1430-1500 ?SLP Individual Time Calculation (min): 30 min ? ?Short Term Goals: ?Week 3: SLP Short Term Goal 1 (Week 3): STGs=LTGs due to ELOS ? ?Skilled Therapeutic Interventions: Skilled treatment session focused on completion of family education with the patient, her son, granddaughter and daughter. SLP facilitated session by providing education regarding patient's current cognitive-linguistic function and strategies to utilize at home to maximize verbal expression, word-finding, and basic cognition upon discharge. SLP also provided several examples of language activities she can participate in at home like verbalizing familiar cake recipes while assisting with baking, verbalizing messages to write in "thank you" notes,  talking about picture albums, etc. Family verbalized understanding of all information and handouts were given to reinforce information. Patient left upright in bed with family present. Continue with current plan of care.  ?   ? ?Pain ?No/Denies Pain  ? ?Therapy/Group: Individual Therapy ? ?Jailynn Lavalais ?05/21/2021, 3:07 PM ?

## 2021-05-21 NOTE — Progress Notes (Signed)
Physical Therapy Session Note ? ?Patient Details  ?Name: Rita Yoder ?MRN: [id number] ?Date of Birth: May 09, 1940 ? ?Today's Date: 05/21/2021 ?PT Individual Time: 0488-8916 ?PT Individual Time Calculation (min): 45 min  ? ?Short Term Goals: ?Week 3:  PT Short Term Goal 1 (Week 3): STG=LTG due to ELOS. ? ?Skilled Therapeutic Interventions/Progress Updates:  ?   ?Patient in w/c with her daughter, son, and granddaughter in the room upon PT arrival. Patient alert and agreeable to PT session. Patient denied pain during session. ? ?Focused session on family education and hands on training. Patient's family performed all mobility with safe guarding technique throughout session. Educated on minimizing distractions, fatigue management, monitoring for symptoms of A-fib, fall risk/prevention, home modifications to prevent falls, and activation of emergency services in the event of a fall during session.  ? ?Therapeutic Activity: ?Bed Mobility: Patient performed sit to supine with mod A for lower extremity management and scooting up in bed with max A +2 in a flat bed with use of bed rails. Attempted getting into bed set at 30" height to simulate home set-up, patient unable to sit safely with the bed at this height. Discussed use of a hospital bed versus use of a lower guest bed with added bed rails for increased patient independence with bed mobility. Family to discuss and inform CSW if a hospital bed is needed at d/c.  ?Transfers: Patient performed sit to/from stand x2 with close supervision using a RW. Provided verbal cues for hand placement and legs against the chair before sitting. ?Patient performed a simulated low SUV height car transfer with min A for lower extremity management using RW. Provided cues for safe technique, and reclining the seat for reduced knee and hip flexion with bringing legs into the vehicle. ? ?Gait Training:  ?Patient ambulated ~50 feet and 10 feet using RW with CGA. Ambulated with decreased gait  speed, decreased step length and height, forward trunk lean, and downward head gaze.Marland Kitchen Provided verbal cues for safe proximity to RW, erect posture, looking ahead, and increased step height for safety. ?Patient ascended/descended 1x3" step using RW with CGA. Performed step-to gait pattern leading with L while ascending and R while descending. Provided cues for technique and sequencing.  ? ?Patient sitting up in bed with her family at bedside handed off to SLP for continued family education at end of session with breaks locked, bed alarm set, and all needs within reach.  ? ?Therapy Documentation ?Precautions:  ?Precautions ?Precautions: Fall ?Precaution Comments: Rt hemiparesis and Rt inattention ?Restrictions ?Weight Bearing Restrictions: No ? ? ? ?Therapy/Group: Individual Therapy ? ?Doreene Burke PT, DPT ? ?05/21/2021, 10:29 PM  ?

## 2021-05-21 NOTE — Progress Notes (Signed)
Patient ID: Rita Yoder, female   DOB: 04/25/1940, 81 y.o.   MRN: [id number] ? ?SW met with pt and pt family in room to provide updates from team conference, d/c date remains 3/17 and DME recs: RW, hospital bed and w/c. SW will order all DME. SW informed pt son Izell Melvin to expect a phone call from [place]. Answered questions with regard to incontinence supplies can be purchased at a cost due to insurance. No other questions/concerns.  ? ?SW ordered DME with [facility] via parachute. ? ?Loralee Pacas, MSW, LCSWA ?Office: [phone] ?Cell: [phone] ?Fax: [phone]  ?

## 2021-05-21 NOTE — Progress Notes (Signed)
?                                                       PROGRESS NOTE ? ? ?Subjective/Complaints: ?Pt with no problems overnight. Ready for more therapy today. EKG is rate controlled but demonstrate a flutter--fairly regular appearing pattern though ? ?ROS: Patient denies fever, rash, sore throat, blurred vision, dizziness, nausea, vomiting, diarrhea, cough, shortness of breath or chest pain, joint or back/neck pain, headache, or mood change.   ? ? ? ?Objective: ?  ?No results found. ?Recent Labs  ?  05/20/21 ?6301  ?WBC 9.6  ?HGB 9.5*  ?HCT 29.3*  ?PLT 180  ? ? ? ?Recent Labs  ?  05/19/21 ?0652 05/20/21 ?6010  ?NA 136 138  ?K 4.3 4.7  ?CL 112* 112*  ?CO2 16* 19*  ?GLUCOSE 95 84  ?BUN 43* 46*  ?CREATININE 3.18* 3.24*  ?CALCIUM 8.8* 9.0  ? ? ?No intake or output data in the 24 hours ending 05/21/21 1149 ?  ? ?  ? ?Physical Exam: ?Vital Signs ?Blood pressure 107/64, pulse 80, temperature (!) 97.5 ?F (36.4 ?C), temperature source Oral, resp. rate 16, height '5\' 6"'$  (1.676 m), weight 95.8 kg, SpO2 100 %. ? ?Constitutional: No distress . Vital signs reviewed. ?HEENT: NCAT, EOMI, oral membranes moist ?Neck: supple ?Cardiovascular: RRR without murmur. No JVD    ?Respiratory/Chest: CTA Bilaterally without wheezes or rales. Normal effort    ?GI/Abdomen: BS +, non-tender, non-distended ?Ext: no clubbing, cyanosis, or edema ?Psych: pleasant and cooperative  ?Skin: No evidence of breakdown, no evidence of rash ?Musculoskeletal:  ?   moving all 4 limbs. No pain today ?Neuro:  ?Very Alert and Oriented to person, place, stroke. Right central 7, speech clear. Still some word finding deficits.   RUE 4- to 4+/5. RLE 3-4/5 LUE/LLE 4+/5. Decreased light touch on right .  Decreased LT,  stocking glove sensory loss  below knees bilaterally. DTR's 1+ ?  ?  ?Assessment/Plan: ?1. Functional deficits which require 3+ hours per day of interdisciplinary therapy in a comprehensive inpatient rehab setting. ?Physiatrist is providing close team  supervision and 24 hour management of active medical problems listed below. ?Physiatrist and rehab team continue to assess barriers to discharge/monitor patient progress toward functional and medical goals ? ?Care Tool: ? ?Bathing ?   ?   ? Body parts bathed by helper: Right arm, Left arm, Chest, Abdomen, Front perineal area, Buttocks, Right upper leg, Left upper leg, Left lower leg, Right lower leg, Face ?  ?  ?Bathing assist Assist Level: 2 Helpers ?  ?  ?Upper Body Dressing/Undressing ?Upper body dressing   ?What is the patient wearing?: [place] only ?   ?Upper body assist Assist Level: 2 Helpers ?   ?Lower Body Dressing/Undressing ?Lower body dressing ? ? ?   ?What is the patient wearing?: Incontinence brief ? ?  ? ?Lower body assist Assist for lower body dressing: 2 Helpers ?   ? ?Toileting ?Toileting    ?Toileting assist Assist for toileting: 2 Helpers ?  ?  ?Transfers ?Chair/bed transfer ? ?Transfers assist ? Chair/bed transfer activity did not occur: Safety/medical concerns ? ?Chair/bed transfer assist level: Minimal Assistance - Patient > 75% ?Chair/bed transfer assistive device: Walker ?  ?Locomotion ?Ambulation ? ? ?Ambulation assist ? ? Ambulation activity did not  occur: Safety/medical concerns ? ?Assist level: Minimal Assistance - Patient > 75% ?Assistive device: Walker-rolling ?Max distance: 25 ft  ? ?Walk 10 feet activity ? ? ?Assist ? Walk 10 feet activity did not occur: Safety/medical concerns ? ?Assist level: Minimal Assistance - Patient > 75% ?Assistive device: Walker-rolling  ? ?Walk 50 feet activity ? ? ?Assist Walk 50 feet with 2 turns activity did not occur: Safety/medical concerns ? ?  ?   ? ? ?Walk 150 feet activity ? ? ?Assist Walk 150 feet activity did not occur: Safety/medical concerns ? ?  ?  ?  ? ?Walk 10 feet on uneven surface  ?activity ? ? ?Assist Walk 10 feet on uneven surfaces activity did not occur: Safety/medical concerns ? ? ?  ?   ? ?Wheelchair ? ? ? ? ?Assist Is the  patient using a wheelchair?: Yes ?Type of Wheelchair: Manual ?Wheelchair activity did not occur: Safety/medical concerns ? ?  ?   ? ? ?Wheelchair 50 feet with 2 turns activity ? ? ? ?Assist ? ?  ?Wheelchair 50 feet with 2 turns activity did not occur: Safety/medical concerns ? ? ?   ? ?Wheelchair 150 feet activity  ? ? ? ?Assist ? Wheelchair 150 feet activity did not occur: Safety/medical concerns ? ? ?   ? ?Blood pressure 107/64, pulse 80, temperature (!) 97.5 ?F (36.4 ?C), temperature source Oral, resp. rate 16, height '5\' 6"'$  (1.676 m), weight 95.8 kg, SpO2 100 %. ? ?Medical Problem List and Plan: ?1. Functional deficits secondary to acute multifocal infarct including left ischemic thalamus, left parietal cortical and subcortical infarcts ?     -Continue CIR therapies including PT, OT, and SLP. Interdisciplinary team conference today to discuss goals, barriers to discharge, and dc planning.   ?-hopefully still on track for 3/17  ?2.  Antithrombotics: ?-DVT/anticoagulation:  Pharmaceutical: Other (comment) Eliquis ?            -antiplatelet therapy: N/A ?3. Pain/left shoulder girdle pain: continue kpad ? -muscle rub for shoulder girdle has helped ?  -pain improved ?4. Mood: Provide emotional support ?            -antipsychotic agents: N/A ?5. Neuropsych: This patient is not capable of making decisions on her own behalf. ?6. Skin/Wound Care: Routine skin checks ?7. Fluids/Electrolytes/Nutrition: Routine in and outs ?8.  New onset atrial fibrillation with RVR.  Amiodarone 100 mg daily.  Follow-up cardiology services.    ? 3/14 HR better but EKG still demonstrate a flutter although pattern appeared fairly regular to me ? -amiodarone increased to '200mg'$  bid by cardiology--appreciate f/u ? -continue current norvasc ?9.  Hypertension.  Norvasc 10 mg daily, Imdur 60 mg daily.    ? -HCTZ still on hold d/t #11 ? 3/14: controlled with norvasc and imdur ?Vitals:  ? 05/20/21 1918 05/21/21 0350  ?BP: (!) 157/92 107/64  ?Pulse: 72  80  ?Resp: 16 16  ?Temp: 98.6 ?F (37 ?C) (!) 97.5 ?F (36.4 ?C)  ?SpO2: 100%   ?  ? ?10.  Suspicion for focal seizure.  Continue Keppra 500 mg twice daily.  EEG not completed due to glued wig could not be removed. ?11.  AKI on CKD stage IIIB- GFR 20.  Marland Kitchen  HCTZ on hold. ? 3/13 BUN/Cr improved--near baseline ?   ?BMP Latest Ref Rng & Units 05/20/2021 05/19/2021 05/18/2021  ?Glucose 70 - 99 mg/dL 84 95 159(H)  ?BUN 8 - 23 mg/dL 46(H) 43(H) 45(H)  ?Creatinine 0.44 - 1.00 mg/dL  3.24(H) 3.18(H) 3.22(H)  ?BUN/Creat Ratio 12 - 28 - - -  ?Sodium 135 - 145 mmol/L 138 136 140  ?Potassium 3.5 - 5.1 mmol/L 4.7 4.3 5.4(H)  ?Chloride 98 - 111 mmol/L 112(H) 112(H) 113(H)  ?CO2 22 - 32 mmol/L 19(L) 16(L) 16(L)  ?Calcium 8.9 - 10.3 mg/dL 9.0 8.8(L) 9.5  ?  ? ?12.  Hyperlipidemia.  Continue Lipitor ?13.  Hypothyroidism.  Synthroid ?14.  Normocytic anemia/monoclonal gammopathy.  Follow-up hematology services Dr.Kale ?15.  Constipation.  MiraLAX twice daily.  Senokot daily. ? +BM 3/12 ?16.  Suspect UTI.  treated. ?17.Type 2 diabetes mellitus.HG  AIC 4.5    ?18. Transaminitis: tylenol d/ced ?19. Leukocytosis:    ?  Resolved, 9.1 3/6 ? ?LOS: ?19 days ?A FACE TO FACE EVALUATION WAS PERFORMED ? ?Meredith Staggers ?05/21/2021, 11:49 AM  ? ?   ?

## 2021-05-21 NOTE — Progress Notes (Signed)
Subjective:  ?Denies any chest pain or shortness of breath.  Patient now converted to atrial flutter with controlled ventricular response. ? ?Objective:  ?Vital Signs in the last 24 hours: ?Temp:  [97.5 ?F (36.4 ?C)-98.6 ?F (37 ?C)] 97.5 ?F (36.4 ?C) (03/14 0350) ?Pulse Rate:  [72-80] 80 (03/14 0350) ?Resp:  [16] 16 (03/14 0350) ?BP: (107-157)/(64-92) 107/64 (03/14 0350) ?SpO2:  [100 %] 100 % (03/13 1918) ? ?Intake/Output from previous day: ?03/13 0701 - 03/14 0700 ?In: 41 [P.O.:58] ?Out: -  ?Intake/Output from this shift: ?No intake/output data recorded. ? ?Physical Exam: ?Exam unchanged ? ?Lab Results: ?Recent Labs  ?  05/20/21 ?5361  ?WBC 9.6  ?HGB 9.5*  ?PLT 180  ? ?Recent Labs  ?  05/19/21 ?0652 05/20/21 ?4431  ?NA 136 138  ?K 4.3 4.7  ?CL 112* 112*  ?CO2 16* 19*  ?GLUCOSE 95 84  ?BUN 43* 46*  ?CREATININE 3.18* 3.24*  ? ?No results for input(s): TROPONINI in the last 72 hours. ? ?Invalid input(s): CK, MB ?Hepatic Function Panel ?No results for input(s): PROT, ALBUMIN, AST, ALT, ALKPHOS, BILITOT, BILIDIR, IBILI in the last 72 hours. ?No results for input(s): CHOL in the last 72 hours. ?No results for input(s): PROTIME in the last 72 hours. ? ?Imaging: ?Imaging results have been reviewed ? ?Cardiac Studies: ? ?Assessment/Plan:  ?Status postprobable cardioembolic multifocal strokes including left ischemic thalamic stroke and left parietal cortical and subcortical infarcts.with possible remote subarachnoid hemorrhage ?Recurrent Paroxysmal atrial fibrillation /atrial flutter with controlled ventricular response Chadsvasc score of 6 started on chronic anticoagulation. ?Hypertension. ?Hyperlipidemia. ?Diabetes mellitus.controlled by diet ?Chronic kidney disease stage IV. ?Status post questionable focal seizures. ?Hypothyroidism. ?Status post UTI ?Plan ?Reduce amiodarone to 200 mg daily. ?Will add low-dose off beta blocker If further episodes of tachycardia ?Check EKG in a.m. ? LOS: 19 days  ? ? ?Charolette Forward ?05/21/2021, 2:57 PM ? ? ? ?

## 2021-05-21 NOTE — Progress Notes (Signed)
Occupational Therapy Session Note ? ?Patient Details  ?Name: Rita Yoder ?MRN: [id number] ?Date of Birth: 22-Feb-1941 ? ?Today's Date: 05/21/2021 ?OT Individual Time: 6144-3154 ?OT Individual Time Calculation (min): 45 min  ? ? ?Short Term Goals: ?Week 3:  OT Short Term Goal 1 (Week 3): STG=LTG d/t ELOS ? ?Skilled Therapeutic Interventions/Progress Updates:  ?  Family education session completed with pt and her sister, son, and granddaughter. Verbal education provided re fall risk reduction, energy conservation strategies, home carryover of transfer training, ADLs, and IADLs. Demonstration and hands on training completed for pt performance of UB/LB bathing and dressing at min A level, toileting hygiene and transfers, and shower transfers. Extensive education and demonstration provided re R attention using functional reaching for items, as we have practiced several times before. Several questions from family answered to their satisfaction. Pt passed off to PT in room.  ? ? ?Therapy Documentation ?Precautions:  ?Precautions ?Precautions: Fall ?Precaution Comments: Rt hemiparesis and Rt inattention ?Restrictions ?Weight Bearing Restrictions: No ? ?Therapy/Group: Individual Therapy ? ?Curtis Sites ?05/21/2021, 6:31 AM ?

## 2021-05-22 NOTE — Progress Notes (Signed)
Subjective:  ?Doing well denies any chest pain or shortness of breath.  Denies any palpitations.  Converted back into sinus rhythm. ?Objective:  ?Vital Signs in the last 24 hours: ?Temp:  [97.5 ?F (36.4 ?C)-98.1 ?F (36.7 ?C)] 98.1 ?F (36.7 ?C) (03/15 0405) ?Pulse Rate:  [02-409] 52 (03/15 0405) ?Resp:  [16-18] 16 (03/15 0405) ?BP: (127-159)/(49-61) 127/57 (03/15 0856) ?SpO2:  [100 %] 100 % (03/15 0405) ? ?Intake/Output from previous day: ?No intake/output data recorded. ?Intake/Output from this shift: ?Total I/O ?In: 120 [P.O.:120] ?Out: -  ? ?Physical Exam: ?Neck: no adenopathy, no carotid bruit, no JVD, and supple, symmetrical, trachea midline ?Lungs: clear to auscultation bilaterally ?Heart: regular rate and rhythm and S1, S2 normal ?Abdomen: soft, non-tender; bowel sounds normal; no masses,  no organomegaly ?Extremities: extremities normal, atraumatic, no cyanosis or edema ? ?Lab Results: ?Recent Labs  ?  05/20/21 ?7353  ?WBC 9.6  ?HGB 9.5*  ?PLT 180  ? ?Recent Labs  ?  05/20/21 ?2992  ?NA 138  ?K 4.7  ?CL 112*  ?CO2 19*  ?GLUCOSE 84  ?BUN 46*  ?CREATININE 3.24*  ? ?No results for input(s): TROPONINI in the last 72 hours. ? ?Invalid input(s): CK, MB ?Hepatic Function Panel ?No results for input(s): PROT, ALBUMIN, AST, ALT, ALKPHOS, BILITOT, BILIDIR, IBILI in the last 72 hours. ?No results for input(s): CHOL in the last 72 hours. ?No results for input(s): PROTIME in the last 72 hours. ? ?Imaging: ?Imaging results have been reviewed and No results found. ? ?Cardiac Studies: ? ?Assessment/Plan:  ?Status postprobable cardioembolic multifocal strokes including left ischemic thalamic stroke and left parietal cortical and subcortical infarcts.with possible remote subarachnoid hemorrhage ?Status post recurrent Paroxysmal atrial fibrillation /atrial flutter with controlled ventricular response Chadsvasc score of 6 started on chronic anticoagulation. ?Hypertension. ?Hyperlipidemia. ?Diabetes mellitus.controlled by  diet ?Chronic kidney disease stage IV. ?Status post questionable focal seizures. ?Hypothyroidism. ?Status post UTI ?Plan ?Continue present management ?I will sign off please call if needed ?Follow-up with me in 2 weeks ?I will reduce the amiodarone dose as outpatient if further marked bradycardia. ? LOS: 20 days  ? ? ?Charolette Forward ?05/22/2021, 11:37 AM ? ? ? ?

## 2021-05-22 NOTE — Progress Notes (Signed)
Occupational Therapy Discharge Summary ? ?Patient Details  ?Name: Rita Yoder ?MRN: [id number] ?Date of Birth: Jan 25, 1941 ? ? ?Patient has met 5 of 6 long term goals due to improved activity tolerance, improved balance, postural control, ability to compensate for deficits, functional use of  RIGHT upper and RIGHT lower extremity, improved attention, improved awareness, and improved coordination.  Patient to discharge at [facility] Assist level.  Patient's care partner is independent to provide the necessary physical and cognitive assistance at discharge. Rita Yoder has made excellent progress, improving from a max-total A bed level to a CGA/(S) with mobility to min A with ADL level. Her motor planning and R inattention deficits have also improved significantly but remain her biggest deficit. Family education has been completed with Rita Yoder's son, sister, and granddaughter.  ? ?Reasons goals not met: Toileting goal not met d/t motor planning deficits making it unsanitary for her to attempt cleaning bowel movements. Recommend family do the majority of this hygiene at home to reduce UTI risk.  ? ?Recommendation:  ?Patient will benefit from ongoing skilled OT services in home health setting to continue to advance functional skills in the area of BADL and Reduce care partner burden. ? ?Equipment: ?BSC ? ?Reasons for discharge: treatment goals met and discharge from hospital ? ?Patient/family agrees with progress made and goals achieved: Yes ? ?OT Discharge ?Precautions/Restrictions  ?Precautions ?Precautions: Fall ?Precaution Comments: Rt hemiparesis and Rt inattention ?Restrictions ?Weight Bearing Restrictions: No ?Other Position/Activity Restrictions: L hip pain from OA ? ?Pain ?Pain Assessment ?Pain Scale: 0-10 ?Pain Score: 0-No pain ?ADL ?ADL ?Eating: Supervision/safety ?Where Assessed-Eating: Edge of bed ?Grooming: Supervision/safety ?Where Assessed-Grooming: Edge of bed ?Upper Body Bathing: Supervision/safety ?Where  Assessed-Upper Body Bathing: Sitting at sink ?Lower Body Bathing: Minimal assistance ?Where Assessed-Lower Body Bathing: Sitting at sink ?Upper Body Dressing: Minimal assistance ?Where Assessed-Upper Body Dressing: Wheelchair ?Lower Body Dressing: Minimal assistance ?Where Assessed-Lower Body Dressing: Wheelchair ?Toileting: Moderate assistance ?Where Assessed-Toileting: Bed level ?Toilet Transfer: Close supervision ?Toilet Transfer Method: Ambulating ?Tub/Shower Transfer: Not assessed ?ADL Comments: Pt dependent +2 for LB self care and toileting ?Vision ?Baseline Vision/History: 1 Wears glasses ?Patient Visual Report: Blurring of vision ?Vision Assessment?: Yes ?Eye Alignment: Within Functional Limits ?Ocular Range of Motion: Restricted on the right ?Alignment/Gaze Preference: Within Defined Limits (much improved) ?Tracking/Visual Pursuits: Decreased smoothness of horizontal tracking;Decreased smoothness of vertical tracking (limited R tracking d/t inattention) ?Saccades: Decreased speed of saccadic movement;Overshoots ?Additional Comments: Still requires cueing to look/scan R but greatly improved. Inattention and motor planning affects visual perception ?Perception  ?Perception: Impaired ?Inattention/Neglect: Does not attend to right side of body;Does not attend to right visual field ?Praxis ?Praxis: Impaired ?Praxis Impairment Details: Initiation;Ideomotor;Motor planning ?Praxis-Other Comments: Much imroved but pt still has severe motor planning and perceptual deficits ?Cognition ?Cognition ?Overall Cognitive Status: Impaired/Different from baseline ?Arousal/Alertness: Awake/alert ?Orientation Level: Situation;Place;Person ?Person: Oriented ?Place: Oriented ?Situation: Oriented ?Memory: Impaired ?Memory Impairment: Decreased recall of new information;Decreased short term memory ?Decreased Short Term Memory: Verbal basic;Functional basic ?Attention: Selective ?Focused Attention: Appears intact ?Sustained  Attention: Appears intact ?Selective Attention: Impaired ?Selective Attention Impairment: Verbal basic;Functional basic ?Awareness: Impaired ?Awareness Impairment: Emergent impairment ?Safety/Judgment: Impaired ?Brief Interview for Mental Status (BIMS) ?Repetition of Three Words (First Attempt): 3 ?Temporal Orientation: Year: No answer ?Temporal Orientation: Month: Accurate within 5 days ?Temporal Orientation: Day: Correct ?Recall: "Sock": No, could not recall ?Recall: "Blue": Yes, no cue required ?Recall: "Bed": No, could not recall ?BIMS Summary Score: 8 ?Sensation ?Sensation ?Light Touch: Impaired Detail ?Central sensation comments:  Reports some diminished sensation but is able to locate stimuli ?Light Touch Impaired Details: Impaired RUE;Impaired RLE ?Proprioception: Impaired Detail ?Proprioception Impaired Details: Impaired RUE;Impaired RLE ?Coordination ?Gross Motor Movements are Fluid and Coordinated: No ?Fine Motor Movements are Fluid and Coordinated: No ?Coordination and Movement Description: Impaired due to Rt hemiparesis ?Motor  ?Motor ?Motor: Hemiplegia;Motor apraxia;Abnormal postural alignment and control ?Motor - Discharge Observations: Much improved ?Mobility  ?Bed Mobility ?Bed Mobility: Rolling Right;Rolling Left;Sit to Supine;Supine to Sit ?Rolling Right: Supervision/verbal cueing ?Rolling Left: Supervision/Verbal cueing ?Supine to Sit: Supervision/Verbal cueing ?Sit to Supine: Supervision/Verbal cueing ?Transfers ?Sit to Stand: Supervision/Verbal cueing ?Stand to Sit: Supervision/Verbal cueing  ?Trunk/Postural Assessment  ?Cervical Assessment ?Cervical Assessment: Within Functional Limits ?Thoracic Assessment ?Thoracic Assessment: Exceptions to [facility] (kyphotic posture) ?Lumbar Assessment ?Lumbar Assessment: Exceptions to [facility] (posterior pelvic tilt) ?Postural Control ?Postural Control: Deficits on evaluation (delayed righting reactions)  ?Balance ?Balance ?Balance Assessed: Yes ?Standardized Balance  Assessment ?Standardized Balance Assessment: Vestibular Evaluation ?Static Sitting Balance ?Static Sitting - Balance Support: Feet supported ?Static Sitting - Level of Assistance: 6: Modified independent (Device/Increase time) ?Dynamic Sitting Balance ?Dynamic Sitting - Balance Support: Feet supported ?Dynamic Sitting - Level of Assistance: 6: Modified independent (Device/Increase time) ?Static Standing Balance ?Static Standing - Balance Support: During functional activity ?Static Standing - Level of Assistance: 5: Stand by assistance ?Dynamic Standing Balance ?Dynamic Standing - Balance Support: During functional activity ?Dynamic Standing - Level of Assistance: 5: Stand by assistance (CGA) ?Extremity/Trunk Assessment ?RUE Assessment ?RUE Assessment: Exceptions to [facility] ?Active Range of Motion (AROM) Comments: 80 degrees of shoulder flexion ?RUE Body System: Neuro ?Brunstrum levels for arm and hand: Arm;Hand ?Brunstrum level for arm: Stage V Relative Independence from [employer] ?Brunstrum level for hand: Stage VI Isolated joint movements ?LUE Assessment ?LUE Assessment: Within Functional Limits ? ? ?Curtis Sites ?05/22/2021, 10:15 AM ?

## 2021-05-22 NOTE — Progress Notes (Signed)
Occupational Therapy Session Note ? ?Patient Details  ?Name: Rita Yoder ?MRN: [id number] ?Date of Birth: 1940-05-04 ? ?Today's Date: 05/22/2021 ?OT Individual Time: 1300-1400 ?OT Individual Time Calculation (min): 60 min  ? ? ?Short Term Goals: ?Week 1:  OT Short Term Goal 1 (Week 1): Pt will complete BSC trasnfer with +2 assist and LRAD ?OT Short Term Goal 1 - Progress (Week 1): Met ?OT Short Term Goal 2 (Week 1): Pt will complete LB dressing at sit<stand level with LRAD ?OT Short Term Goal 2 - Progress (Week 1): Progressing toward goal ?OT Short Term Goal 3 (Week 1): Pt will complete 1 component of UB dressing with Max A ?OT Short Term Goal 3 - Progress (Week 1): Met ? ?Skilled Therapeutic Interventions/Progress Updates:  ?   ?Pt received in bed with no pain  ?ADL: ?Pt completes ADL at overall min Level. Skilled interventions include: MIN HOH A and tactile cuing to manage pants past hips. CGA overall for balance during STS and clothing management. CGA transfer to w/c with RW from EOB after seated rest. ? ? ?Therapeutic activity ?9HPT ?RUE 59mn 44 sec (5 min to put into holes)- frequent grip slips and decreased in hand manipulation impacting speed. LUE constrained ?LUE 1 min 33 sec ? ?Visual scanning with BITS system with pt demo very poor R attention, R scanning and poor visual perception. Pt unable to identify numbers, letters or large pictures (bells cancelation test). Pt requires max up to hand over hand cuing for scanning for stimuli at midline or R ? ?Pt left at end of session in bed with exit alarm on, call light in reach and all needs met ? ? ?Therapy Documentation ?Precautions:  ?Precautions ?Precautions: Fall ?Precaution Comments: Rt hemiparesis and Rt inattention ?Restrictions ?Weight Bearing Restrictions: No ? ?Therapy/Group: Individual Therapy ? ?SLowella DellSchlosser ?05/22/2021, 6:57 AM ?

## 2021-05-22 NOTE — Progress Notes (Signed)
Speech Language Pathology Daily Session Note ? ?Patient Details  ?Name: Rita Yoder ?MRN: [id number] ?Date of Birth: 05/17/1940 ? ?Today's Date: 05/22/2021 ?SLP Individual Time: 7829-5621 ?SLP Individual Time Calculation (min): 25 min ? ?Short Term Goals: ?Week 3: SLP Short Term Goal 1 (Week 3): STGs=LTGs due to ELOS ? ?Skilled Therapeutic Interventions: Skilled treatment session focused on cognitive-linguistic goals. SLP facilitated session by providing extra time and overall Min verbal cues for word-finding during a mildly structured language task of listing ingredients of a familiar recipe. Patient demonstrated increased difficulty with verbally sequencing steps to making pudding as well as verbalizing more abstract ingredients needed. Patient reported increased difficulty with word-finding today due to fatigue. Patient left upright in bed with alarm on and all needs within reach. Continue with current plan of care.  ?   ? ?Pain ?Pain Assessment ?Pain Scale: 0-10 ?Pain Score: 0-No pain ? ?Therapy/Group: Individual Therapy ? ?Micaela Stith ?05/22/2021, 12:34 PM ?

## 2021-05-22 NOTE — Progress Notes (Signed)
Physical Therapy Session Note ? ?Patient Details  ?Name: Rita Yoder ?MRN: [id number] ?Date of Birth: 1941-03-03 ? ?Today's Date: 05/22/2021 ?PT Individual Time: 0800-0900 ?PT Individual Time Calculation (min): 60 min  ? ?Short Term Goals: ?Week 3:  PT Short Term Goal 1 (Week 3): STG=LTG due to ELOS. ? ?Skilled Therapeutic Interventions/Progress Updates:  ?   ?Patient in bed upon PT arrival. Patient alert and agreeable to PT session, reports increased fatigue this morning with increased symptoms of aphasia throughout session. Patient denied pain during session. MD rounded at beginning of session. Discussed monitoring vitals with mobility due to recent cardiac changes on EKG. Educate patient on A-fib, A-flutter, and symptoms associated with each. ? ?Orthostatic Vitals: ?Supine: BP 126/59, HR 54 (asymptomatic) ?Sitting: BP 136/69, HR 60 (asymptomatic) ?Standing: BP 101/67, HR 130 (symptomatic), HR recovered to 70's in sitting <1 min ?Supine: BP 130, HR 127/52 (asymptomatic) ? ?Patient had not yet had morning medications prior to session. ? ?Therapeutic Activity: ?Bed Mobility: Patient performed supine to sit with supervision and increased time in a flat bed with use of bed rails. Provided min verbal cues for sequencing. She performed sit to supine with min A for lower extremity management and adjustment in the bed in supine and scooting up in the bed with max A x2 with the bed in trendelenburg. She performed rolling R/L with supervision with use of bed rails for PT to adjust pants and patient's brief for improved placement. Patient sat EOB with supervision-mod I for sitting balance as she doffed her shirt and gown and donned a long sleeve shirt. Min cues for removing clothes from the R arm and placing her R arm in first when donning her shirt. Required increased time for motor planning. Patient requested to use the [facility] before donning pants ?Transfers: Patient performed stand pivot bed<>BSC with min A-CGA using the  RW. Provided verbal cues for forward weight shift and hand placement on RW. ?Patient was continent of bladder on BSC. PT applied B knee high ACE wraps due to orthostasis in standing, see above, while patient was seated on BSC. Performed peri-care and lower body clothing management with total A due to symptoms and patient fatigue.  ? ?Patient in bed with RN providing morning medications at end of session with breaks locked, bed alarm set, and all needs within reach.  ?Due to patient's signs and symptoms related to mobility, recommended patient take morning meds prior to mobility at d/c for improved activity tolerance, patient in agreement.  ? ?Therapy Documentation ?Precautions:  ?Precautions ?Precautions: Fall ?Precaution Comments: Rt hemiparesis and Rt inattention ?Restrictions ?Weight Bearing Restrictions: No ?Other Position/Activity Restrictions: L hip pain from OA ? ? ? ?Therapy/Group: Individual Therapy ? ?Doreene Burke PT, DPT ? ?05/22/2021, 4:19 PM  ?

## 2021-05-22 NOTE — Progress Notes (Signed)
?                                                       PROGRESS NOTE ? ? ?Subjective/Complaints: ?Pt feels ok this morning. Felt a little tired in therapy yesterday but otherwise did well.  ? ?ROS: Patient denies fever, rash, sore throat, blurred vision, dizziness, nausea, vomiting, diarrhea, cough, shortness of breath or chest pain, joint or back/neck pain, headache, or mood change.  ? ? ?Objective: ?  ?No results found. ?Recent Labs  ?  05/20/21 ?0263  ?WBC 9.6  ?HGB 9.5*  ?HCT 29.3*  ?PLT 180  ? ? ? ?Recent Labs  ?  05/20/21 ?7858  ?NA 138  ?K 4.7  ?CL 112*  ?CO2 19*  ?GLUCOSE 84  ?BUN 46*  ?CREATININE 3.24*  ?CALCIUM 9.0  ? ? ? ?Intake/Output Summary (Last 24 hours) at 05/22/2021 0944 ?Last data filed at 05/22/2021 0846 ?Gross per 24 hour  ?Intake 120 ml  ?Output --  ?Net 120 ml  ? ?  ? ?  ? ?Physical Exam: ?Vital Signs ?Blood pressure (!) 127/57, pulse (!) 52, temperature 98.1 ?F (36.7 ?C), temperature source Oral, resp. rate 16, height '5\' 6"'$  (1.676 m), weight 95.8 kg, SpO2 100 %. ? ?Constitutional: No distress . Vital signs reviewed. ?HEENT: NCAT, EOMI, oral membranes moist ?Neck: supple ?Cardiovascular: RRR without murmur. No JVD    ?Respiratory/Chest: CTA Bilaterally without wheezes or rales. Normal effort    ?GI/Abdomen: BS +, non-tender, non-distended ?Ext: no clubbing, cyanosis, or edema ?Psych: pleasant and cooperative  ?Skin: No evidence of breakdown, no evidence of rash ?Musculoskeletal:  ?   moving all 4 limbs. No pain today ?Neuro:  ?Very Alert and Oriented to person, place, stroke. Right central 7, speech clear. Still some word finding deficits but works through it.   RUE 4- to 4+/5. RLE 3-4/5 LUE/LLE 4+/5. Decreased light touch on right .  Decreased LT,  stocking glove sensory loss  below knees bilaterally. DTR's 1+ ?  ?  ?Assessment/Plan: ?1. Functional deficits which require 3+ hours per day of interdisciplinary therapy in a comprehensive inpatient rehab setting. ?Physiatrist is providing close  team supervision and 24 hour management of active medical problems listed below. ?Physiatrist and rehab team continue to assess barriers to discharge/monitor patient progress toward functional and medical goals ? ?Care Tool: ? ?Bathing ?   ?   ? Body parts bathed by helper: Right arm, Left arm, Chest, Abdomen, Front perineal area, Buttocks, Right upper leg, Left upper leg, Left lower leg, Right lower leg, Face ?  ?  ?Bathing assist Assist Level: 2 Helpers ?  ?  ?Upper Body Dressing/Undressing ?Upper body dressing   ?What is the patient wearing?: [place] only ?   ?Upper body assist Assist Level: 2 Helpers ?   ?Lower Body Dressing/Undressing ?Lower body dressing ? ? ?   ?What is the patient wearing?: Incontinence brief ? ?  ? ?Lower body assist Assist for lower body dressing: 2 Helpers ?   ? ?Toileting ?Toileting    ?Toileting assist Assist for toileting: 2 Helpers ?  ?  ?Transfers ?Chair/bed transfer ? ?Transfers assist ? Chair/bed transfer activity did not occur: Safety/medical concerns ? ?Chair/bed transfer assist level: Minimal Assistance - Patient > 75% ?Chair/bed transfer assistive device: Walker ?  ?Locomotion ?Ambulation ? ? ?  Ambulation assist ? ? Ambulation activity did not occur: Safety/medical concerns ? ?Assist level: Minimal Assistance - Patient > 75% ?Assistive device: Walker-rolling ?Max distance: 25 ft  ? ?Walk 10 feet activity ? ? ?Assist ? Walk 10 feet activity did not occur: Safety/medical concerns ? ?Assist level: Minimal Assistance - Patient > 75% ?Assistive device: Walker-rolling  ? ?Walk 50 feet activity ? ? ?Assist Walk 50 feet with 2 turns activity did not occur: Safety/medical concerns ? ?  ?   ? ? ?Walk 150 feet activity ? ? ?Assist Walk 150 feet activity did not occur: Safety/medical concerns ? ?  ?  ?  ? ?Walk 10 feet on uneven surface  ?activity ? ? ?Assist Walk 10 feet on uneven surfaces activity did not occur: Safety/medical concerns ? ? ?  ?   ? ?Wheelchair ? ? ? ? ?Assist Is the  patient using a wheelchair?: Yes ?Type of Wheelchair: Manual ?Wheelchair activity did not occur: Safety/medical concerns ? ?  ?   ? ? ?Wheelchair 50 feet with 2 turns activity ? ? ? ?Assist ? ?  ?Wheelchair 50 feet with 2 turns activity did not occur: Safety/medical concerns ? ? ?   ? ?Wheelchair 150 feet activity  ? ? ? ?Assist ? Wheelchair 150 feet activity did not occur: Safety/medical concerns ? ? ?   ? ?Blood pressure (!) 127/57, pulse (!) 52, temperature 98.1 ?F (36.7 ?C), temperature source Oral, resp. rate 16, height '5\' 6"'$  (1.676 m), weight 95.8 kg, SpO2 100 %. ? ?Medical Problem List and Plan: ?1. Functional deficits secondary to acute multifocal infarct including left ischemic thalamus, left parietal cortical and subcortical infarcts ?     -Continue CIR therapies including PT, OT, and SLP  ?-looks to be on track for 3/17  ?2.  Antithrombotics: ?-DVT/anticoagulation:  Pharmaceutical: Other (comment) Eliquis ?            -antiplatelet therapy: N/A ?3. Pain/left shoulder girdle pain: continue kpad ? -muscle rub for shoulder girdle has helped ?  -pain improved ?4. Mood: Provide emotional support ?            -antipsychotic agents: N/A ?5. Neuropsych: This patient is not capable of making decisions on her own behalf. ?6. Skin/Wound Care: Routine skin checks ?7. Fluids/Electrolytes/Nutrition: Routine in and outs ?8.  New onset atrial fibrillation with RVR.  Amiodarone 100 mg daily.  Follow-up cardiology services.    ? 3/15-bradycardia, EKG in sinus brady  ? -amiodarone increased to '200mg'$  bid by cardiology--appreciate f/u ? -continue current norvasc ?9.  Hypertension.  Norvasc 10 mg daily, Imdur 60 mg daily.    ? -HCTZ still on hold d/t #11 ? 3/15: controlled with norvasc and imdur ?Vitals:  ? 05/22/21 0405 05/22/21 0856  ?BP: (!) 133/49 (!) 127/57  ?Pulse: (!) 52   ?Resp: 16   ?Temp: 98.1 ?F (36.7 ?C)   ?SpO2: 100%   ?  ? ?10.  Suspicion for focal seizure.  Continue Keppra 500 mg twice daily.  EEG not completed  due to glued wig could not be removed. ?11.  AKI on CKD stage IIIB- GFR 20.  Marland Kitchen  HCTZ on hold. ? 3/13 BUN/Cr improved--near baseline ?   ?BMP Latest Ref Rng & Units 05/20/2021 05/19/2021 05/18/2021  ?Glucose 70 - 99 mg/dL 84 95 159(H)  ?BUN 8 - 23 mg/dL 46(H) 43(H) 45(H)  ?Creatinine 0.44 - 1.00 mg/dL 3.24(H) 3.18(H) 3.22(H)  ?BUN/Creat Ratio 12 - 28 - - -  ?Sodium 135 -  145 mmol/L 138 136 140  ?Potassium 3.5 - 5.1 mmol/L 4.7 4.3 5.4(H)  ?Chloride 98 - 111 mmol/L 112(H) 112(H) 113(H)  ?CO2 22 - 32 mmol/L 19(L) 16(L) 16(L)  ?Calcium 8.9 - 10.3 mg/dL 9.0 8.8(L) 9.5  ?  ? ?12.  Hyperlipidemia.  Continue Lipitor ?13.  Hypothyroidism.  Synthroid ?14.  Normocytic anemia/monoclonal gammopathy.  Follow-up hematology services Dr.Kale ?15.  Constipation.  MiraLAX twice daily.  Senokot daily. ?16.  Suspect UTI.  treated. ?17.Type 2 diabetes mellitus.HG  AIC 4.5    ?18. Transaminitis: tylenol d/ced ?19. Leukocytosis:    ?  Resolved, 9.1 3/6 ? ?LOS: ?20 days ?A FACE TO FACE EVALUATION WAS PERFORMED ? ?Meredith Staggers ?05/22/2021, 9:44 AM  ? ?   ?

## 2021-05-22 NOTE — Progress Notes (Signed)
Occupational Therapy Session Note ? ?Patient Details  ?Name: Rita Yoder ?MRN: [id number] ?Date of Birth: 1940-08-11 ? ?Today's Date: 05/22/2021 ?OT Individual Time: 8119-1478 ?OT Individual Time Calculation (min): 54 min  ? ? ?Short Term Goals: ?Week 3:  OT Short Term Goal 1 (Week 3): STG=LTG d/t ELOS ? ?Skilled Therapeutic Interventions/Progress Updates:  ?  Pt supine with no c/o pain. PT alerted OT that prior session BP and HR were labile. No concerns this session now that pt has received her medication, skilled monitoring of vitals. Discussed d/c planning and yesterdays family education with pt in length. Pt came to EOB with (S), slow pace for motor planning. (S) stand pivot transfer to the w/c. She was taken to the tub room where she completed a tub transfer using the TTB with min A to lift her BLE. She then completed a functional reaction/visual perception task on the bits- requiring max cueing for R attention and to locate circles accurately. Little to no awareness of success in this task, often asking "did I do it?" When touching several inches from the target. She returned to her room and transferred back to bed with CGA using the RW. Min A to bring legs back into bed. She was left supine with all needs, bed alarm set.  ? ?Therapy Documentation ?Precautions:  ?Precautions ?Precautions: Fall ?Precaution Comments: Rt hemiparesis and Rt inattention ?Restrictions ?Weight Bearing Restrictions: No ? ?Therapy/Group: Individual Therapy ? ?Curtis Sites ?05/22/2021, 6:27 AM ?

## 2021-05-23 ENCOUNTER — Other Ambulatory Visit (HOSPITAL_COMMUNITY): Payer: Self-pay

## 2021-05-23 MED ORDER — AMIODARONE HCL 200 MG PO TABS
100.0000 mg | ORAL_TABLET | Freq: Every day | ORAL | 0 refills | Status: DC
  Filled 2021-05-23: qty 30, 60d supply, fill #0

## 2021-05-23 MED ORDER — VITAMIN D3 50 MCG (2000 UT) PO TABS
2000.0000 [IU] | ORAL_TABLET | Freq: Every day | ORAL | 0 refills | Status: AC
  Filled 2021-05-23: qty 30, 30d supply, fill #0

## 2021-05-23 MED ORDER — ATORVASTATIN CALCIUM 40 MG PO TABS
40.0000 mg | ORAL_TABLET | Freq: Every day | ORAL | 0 refills | Status: DC
  Filled 2021-05-23: qty 30, 30d supply, fill #0

## 2021-05-23 MED ORDER — ISOSORBIDE MONONITRATE ER 60 MG PO TB24
60.0000 mg | ORAL_TABLET | Freq: Every day | ORAL | 0 refills | Status: DC
  Filled 2021-05-23: qty 30, 30d supply, fill #0

## 2021-05-23 MED ORDER — AMLODIPINE BESYLATE 10 MG PO TABS
10.0000 mg | ORAL_TABLET | Freq: Every day | ORAL | 0 refills | Status: DC
  Filled 2021-05-23: qty 30, 30d supply, fill #0

## 2021-05-23 MED ORDER — LEVOTHYROXINE SODIUM 25 MCG PO TABS
25.0000 ug | ORAL_TABLET | Freq: Every day | ORAL | 0 refills | Status: DC
  Filled 2021-05-23: qty 30, 30d supply, fill #0

## 2021-05-23 MED ORDER — ACETAMINOPHEN 500 MG PO TABS: 500.0000 mg | ORAL_TABLET | Freq: Four times a day (QID) | ORAL | 0 refills | Status: DC | PRN

## 2021-05-23 MED ORDER — SENNA 8.6 MG PO TABS
1.0000 | ORAL_TABLET | Freq: Every day | ORAL | 0 refills | Status: DC
Start: 2021-05-23 — End: 2022-01-22

## 2021-05-23 MED ORDER — MUSCLE RUB 10-15 % EX CREA: 1.0000 "application " | TOPICAL_CREAM | Freq: Two times a day (BID) | CUTANEOUS | 0 refills | Status: DC

## 2021-05-23 MED ORDER — APIXABAN 2.5 MG PO TABS
2.5000 mg | ORAL_TABLET | Freq: Two times a day (BID) | ORAL | 1 refills | Status: DC
  Filled 2021-05-23: qty 60, 30d supply, fill #0

## 2021-05-23 MED ORDER — LEVETIRACETAM 100 MG/ML PO SOLN
500.0000 mg | Freq: Two times a day (BID) | ORAL | 0 refills | Status: DC
  Filled 2021-05-23: qty 473, 47d supply, fill #0

## 2021-05-23 MED ORDER — POLYETHYLENE GLYCOL 3350 17 G PO PACK: 17.0000 g | PACK | Freq: Two times a day (BID) | ORAL | 0 refills | Status: DC

## 2021-05-23 NOTE — Progress Notes (Signed)
Physical Therapy Session Note ? ?Patient Details  ?Name: Rita Yoder ?MRN: [id number] ?Date of Birth: Aug 20, 1940 ? ?Today's Date: 05/21/2021 ?PT Individual Time:  7371-0626  ?PT Individual Time Calculation (min):  28 min  ? ?Short Term Goals: ?Week 2:  PT Short Term Goals - Week 2 ?PT Short Term Goal 1 (Week 2): Patietn will perform bed mobility with min A using hospital bed features >75% of the time. ?PT Short Term Goal 1 - Progress (Week 2): Met ?PT Short Term Goal 2 (Week 2): Patient will perform basic transfer with min A using LRAD consistently. ?PT Short Term Goal 2 - Progress (Week 2): Met ?PT Short Term Goal 3 (Week 2): Patient will ambulate >25 feet using LRAD with min A. ?PT Short Term Goal 3 - Progress (Week 2): Met ?PT Short Term Goals - Week 3 ?PT Short Term Goal 1 (Week 3): STG=LTG due to ELOS. ? ?Skilled Therapeutic Interventions/Progress Updates:  ?Patient supine in bed on entrance to room. Patient alert and agreeable to PT session. Breakfast arrives at start of session, but pt states she cannot eat this early. NT calls for new tray to arrive after therapy session. ? ?Patient with no pain complaint throughout session.  ? ?Pt relates need to get ready for the day. Requests to toilet and dress.  ? ?Therapeutic Activity: ?Bed Mobility: Patient performed supine --> sit with supervision. VC/ tc required for effort and encouragement. In return to supine, pt states that she needs assist with BLE, however when initiated, she did not require physical assist and is able to bring BLE to bed surface with light CGA. Lateral scoot along bedside with supervision/ CGA.  ?Transfers: Patient performed sit<>stand, stand pivot, and toilet transfers throughout session with close supervision. Provided verbal cues for using safety rail in bathroom rather than RW for improved stability as there is no BSC over toilet. ? ?While on toilet, pt assisted with doffing brief and pants with MinA. Provided with washcloth on pt  request for LB hygiene and bathing performed with supervision. Then donning new brief with maxA for time. Donning pants required ModA for time.  ? ?Gait Training:  ?Patient ambulated 12 ft x2 using RW with close supervision. Demonstrated flexed trunk and hips and requires vc/ tc for improving proximity to walker for improved stability and safety. ? ?Patient supine  in ed at end of session with brakes locked, bed alarm set, and all needs within reach. Oriented to time and time of next therapy session.  ?   ? ?Therapy Documentation ?Precautions:  ?Precautions ?Precautions: Fall ?Precaution Comments: mild-mod Rt hemiparesis and Rt inattention ?Restrictions ?Weight Bearing Restrictions: No ?Other Position/Activity Restrictions: L hip pain from OA - improving ?General: ?  ?Pain: ? No pain complaint this session.  ? ? ?Therapy/Group: Individual Therapy ? ?Alger Simons ?05/21/2021, 7:32 AM  ?

## 2021-05-23 NOTE — Progress Notes (Signed)
Physical Therapy Session Note ? ?Patient Details  ?Name: Rita Yoder ?MRN: [id number] ?Date of Birth: [birth year] ? ?Today's Date: 05/23/2021 ?PT Individual Time: 1100-1200 ?PT Individual Time Calculation (min): 60 min  ? ?Short Term Goals: ?Week 3:  PT Short Term Goal 1 (Week 3): STG=LTG due to ELOS. ? ?Skilled Therapeutic Interventions/Progress Updates:  ?   ?Patient in bed with her son at bedside upon PT arrival. Patient alert and agreeable to PT session. Patient denied pain during session. ? ?Adjusted personal w/c for patient to trial prior to d/c. Patient reports increased comfort in sitting in personal w/c. Educated on use of w/c for community access and times for significant need for energy conservation. Educated on performing sit to stand every 30 min for pressure relief. Patient and her son in agreement. ? ?Focused session on d/c assessment, see d/c note for details, and on endurance assessment. Educated patient and her son on patient's progress and future goals for PT based on patient's progress. Patient and her son very appreciative and excited for patient to d/c home tomorrow. ? ?Therapeutic Activity: ?Bed Mobility: Patient performed supine to sit with supervision in a flat bed with use of bed rails. Provided verbal cues for initiation only. ?Transfers: Patient performed sit to/from stand x2 and stand pivot bed>w/c with supervision-CGA using RW. Provided verbal cues for scooting forward, forward weight shift, and hand placement. ? ?Gait Training:  ?6 Min Walk Test:  ?Instructed patient to ambulate as quickly and as safely as possible for 6 minutes using LRAD. Patient was allowed to take standing rest breaks without stopping the test, but if the patient required a sitting rest break the clock would be stopped and the test would be over.  ?Results: 68 feet (20.7 meters, Avg speed 0.13 m/s) stopping at 2 min 40 sec due to 8/10 fatigue using a RW with CGA. Results indicate that the patient has reduced  endurance with ambulation compared to age matched norms.  ?Age Matched Norms: 39-89 yo F: 392 meters ?MDC: 58.21 meters (190.98 feet) or 50 meters ?(ANPTA Core Set of Outcome Measures for Adults with Neurologic Conditions, 2018) ? ?Therapeutic Exercise: ?Patient performed the following exercises with verbal and tactile cues for proper technique. ?Introduced HEP for [employer] exercises to address strength and balance. Pt performed 8 reps x 1 set for BLE including LAQ with 5 second hold, standing hip abduction, mini squats, and sit to stand x5 with RW. Educated on performing exercises 3x per week 8-10 reps each along with daily walking program >2 min 2x per day building up to 5 min. ? ?Patient in w/c set-up for lunch to arrive with her son in the room at end of session with breaks locked, seat belt alarm set, and all needs within reach.  ? ?Therapy Documentation ?Precautions:  ?Precautions ?Precautions: Fall ?Precaution Comments: mild-mod Rt hemiparesis and Rt inattention ?Restrictions ?Weight Bearing Restrictions: No ?Other Position/Activity Restrictions: L hip pain from OA - improving ? ? ? ?Therapy/Group: Individual Therapy ? ?Doreene Burke PT, DPT ? ?05/23/2021, 12:58 PM  ?

## 2021-05-23 NOTE — Progress Notes (Signed)
Inpatient Rehabilitation Discharge Medication Review by a Pharmacist ? ?A complete drug regimen review was completed for this patient to identify any potential clinically significant medication issues. ? ?High Risk Drug Classes Is patient taking? Indication by Medication  ?Antipsychotic No   ?Anticoagulant Yes Apixaban- PAF  ?Antibiotic No   ?Opioid No   ?Antiplatelet No   ?Hypoglycemics/insulin No   ?Vasoactive Medication Yes Norvasc, Pacerone, Imdur- hypertension, arrhythmia, angina  ?Chemotherapy No   ?Other Yes Lipitor- HLD ?Synthroid- Hypothyroidism ?Keppra- seizure prophylaxis ?Vesicare- OAB  ? ? ? ?Type of Medication Issue Identified Description of Issue Recommendation(s)  ?Drug Interaction(s) (clinically significant) ?    ?Duplicate Therapy ?    ?Allergy ?    ?No Medication Administration End Date ?    ?Incorrect Dose ?    ?Additional Drug Therapy Needed ?    ?Significant med changes from prior encounter (inform family/care partners about these prior to discharge).    ?Other ? PTA meds: ?Vitamin D-3 ?NitroSL Restart home meds at time of discharge, if needed  ? ? ?Clinically significant medication issues were identified that warrant physician communication and completion of prescribed/recommended actions by midnight of the next day:  No ? ?Time spent performing this drug regimen review (minutes):  30 ? ? ?Pharrell Ledford BS, PharmD, BCPS ?Clinical Pharmacist ?05/23/2021 9:47 AM ?

## 2021-05-23 NOTE — Progress Notes (Signed)
Physical Therapy Discharge Summary ? ?Patient Details  ?Name: Rita Yoder ?MRN: [id number] ?Date of Birth: March 30, 1940 ? ?Today's Date: 05/23/2021 ? ? ?Patient has met 9 of 9 long term goals due to improved activity tolerance, improved balance, improved postural control, increased strength, decreased pain, ability to compensate for deficits, functional use of  right upper extremity and right lower extremity, improved attention, improved awareness, and improved coordination.  Patient to discharge at an ambulatory level [employer] with a RW.   Patient's care partner is independent to provide the necessary physical and cognitive assistance at discharge. ? ?Reasons goals not met: All PT goals met. ? ?Recommendation:  ?Patient will benefit from ongoing skilled PT services in home health setting to continue to advance safe functional mobility, address ongoing impairments in balance, strength, R hemi-body motor control and attention, functional mobility, activity tolerance, gait training, patient/caregiver education, and minimize fall risk. ? ?Equipment: ?Recommending 20"x16" light weight w/c with standard cushion for energy conservation in the community, hospital bed for increased independence and improved positioning in lying to prevent pressure injuries, and RW with all mobiilty ? ?Reasons for discharge: treatment goals met and discharge from hospital ? ?Patient/family agrees with progress made and goals achieved: Yes ? ?PT Discharge ?Precautions/Restrictions ?Precautions ?Precautions: Fall ?Precaution Comments: mild-mod Rt hemiparesis and Rt inattention ?Restrictions ?Weight Bearing Restrictions: No ?Other Position/Activity Restrictions: L hip pain from OA - improving ?Pain Interference ?Pain Interference ?Pain Effect on Sleep: 1. Rarely or not at all ?Pain Interference with Therapy Activities: 1. Rarely or not at all ?Pain Interference with Day-to-Day Activities: 1. Rarely or not at  all ?Vision/Perception  ?Vision - History ?Ability to See in Adequate Light: 2 Moderately impaired ?Vision - Assessment ?Eye Alignment: Within Functional Limits ?Tracking/Visual Pursuits: Decreased smoothness of horizontal tracking;Decreased smoothness of vertical tracking ?Saccades: Decreased speed of saccadic movement;Overshoots ?Diplopia Assessment: Present in primary gaze;Objects split side to side ("comes and goes" per pt) ?Perception ?Inattention/Neglect: Does not attend to right side of body;Does not attend to right visual field ?Praxis ?Praxis: Impaired ?Praxis Impairment Details: Initiation;Ideomotor;Motor planning  ?Cognition ?Overall Cognitive Status: Impaired/Different from baseline ?Arousal/Alertness: Awake/alert ?Orientation Level: Oriented X4 ?Focused Attention: Appears intact ?Sustained Attention: Appears intact ?Selective Attention: Impaired ?Selective Attention Impairment: Verbal basic;Functional basic ?Memory: Impaired ?Memory Impairment: Decreased recall of new information;Decreased short term memory ?Decreased Short Term Memory: Verbal basic;Functional basic ?Awareness: Impaired ?Awareness Impairment: Emergent impairment ?Problem Solving: Impaired ?Problem Solving Impairment: Functional basic ?Safety/Judgment: Impaired ?Sensation ?Sensation ?Light Touch: Impaired Detail ?Central sensation comments: Reports some diminished sensation of R hemi-body ?Light Touch Impaired Details: Impaired RUE;Impaired RLE ?Hot/Cold: Appears Intact ?Proprioception: Impaired Detail ?Proprioception Impaired Details: Impaired RUE;Impaired RLE ?Coordination ?Gross Motor Movements are Fluid and Coordinated: No ?Fine Motor Movements are Fluid and Coordinated: No ?Coordination and Movement Description: Impaired due to R hemiplegia, R inattention, and decreased motor planning ?Heel Shin Test: unable to perform ?Motor  ?Motor ?Motor: Hemiplegia;Motor apraxia;Abnormal postural alignment and control ?Motor - Discharge  Observations: Much improved  ?Mobility ?Bed Mobility ?Bed Mobility: Rolling Right;Rolling Left;Sit to Supine;Supine to Sit ?Rolling Right: Supervision/verbal cueing ?Rolling Left: Supervision/Verbal cueing ?Supine to Sit: Supervision/Verbal cueing ?Sit to Supine: Minimal Assistance - Patient > 75% ?Transfers ?Transfers: Sit to Stand;Stand to [employer] Transfers ?Sit to Stand: Supervision/Verbal cueing ?Stand to Sit: Supervision/Verbal cueing ?Stand Pivot Transfers: Supervision/Verbal cueing ?Stand Pivot Transfer Details: Verbal cues for technique;Verbal cues for safe use of DME/AE ?Transfer (Assistive device): Rolling walker ?Locomotion  ?Gait ?Ambulation: Yes ?Gait Assistance: Contact Guard/Touching  assist;Supervision/Verbal cueing ?Gait Distance (Feet): 100 Feet ?Assistive device: Rolling walker ?Gait Assistance Details: Verbal cues for safe use of DME/AE;Verbal cues for technique ?Gait ?Gait Pattern: Impaired ?Gait Pattern: Decreased stride length;Decreased stance time - right;Decreased hip/knee flexion - right;Decreased weight shift to right;Lateral hip instability;Decreased trunk rotation;Trunk flexed ?Gait velocity: decreased ?Stairs / Additional Locomotion ?Stairs: Yes ?Stairs Assistance: Minimal Assistance - Patient > 75% ?Stair Management Technique: With walker ?Number of Stairs: 1 ?Height of Stairs: 4 ?Wheelchair Mobility ?Wheelchair Mobility: No (limited by apraxia and body habitus)  ?Trunk/Postural Assessment  ?Cervical Assessment ?Cervical Assessment: Exceptions to [facility] (forward head) ?Thoracic Assessment ?Thoracic Assessment: Exceptions to [facility] (kyphotic posture) ?Lumbar Assessment ?Lumbar Assessment: Exceptions to [facility] (posterior pelvic tilt) ?Postural Control ?Postural Control: Deficits on evaluation (delayed righting reactions)  ?Balance ?Balance ?Balance Assessed: Yes ?Static Sitting Balance ?Static Sitting - Balance Support: Feet supported ?Static Sitting - Level of Assistance: 6: Modified  independent (Device/Increase time) ?Dynamic Sitting Balance ?Dynamic Sitting - Balance Support: Feet supported ?Dynamic Sitting - Level of Assistance: 6: Modified independent (Device/Increase time) ?Static Standing Balance ?Static Standing - Balance Support: During functional activity ?Static Standing - Level of Assistance: 5: Stand by assistance ?Dynamic Standing Balance ?Dynamic Standing - Balance Support: During functional activity ?Dynamic Standing - Level of Assistance: 5: Stand by assistance (CGA) ?Extremity Assessment  ?RLE Assessment ?RLE Assessment: Exceptions to [facility] ?Active Range of Motion (AROM) Comments: limited seated hip flexion due to body habitus and weakness ?RLE Strength ?Right Hip Flexion: 3+/5 ?Right Hip ABduction: 3+/5 ?Right Hip ADduction: 4/5 ?Right Knee Flexion: 4+/5 ?Right Knee Extension: 4+/5 ?Right Ankle Dorsiflexion: 4+/5 ?Right Ankle Plantar Flexion: 4+/5 ?LLE Assessment ?LLE Assessment: Within Functional Limits ?Active Range of Motion (AROM) Comments: Decreased hip flexion ROM due to body habitus ?LLE Strength ?Left Hip Flexion: 4/5 ?Left Hip ABduction: 4/5 ?Left Hip ADduction: 4+/5 ?Left Knee Flexion: 5/5 ?Left Knee Extension: 5/5 ?Left Ankle Dorsiflexion: 5/5 ?Left Ankle Plantar Flexion: 5/5 ? ? ? ?Doreene Burke PT, DPT ? ?05/23/2021, 4:46 PM ?

## 2021-05-23 NOTE — Progress Notes (Signed)
Occupational Therapy Session Note ? ?Patient Details  ?Name: Rita Yoder ?MRN: [id number] ?Date of Birth: 10-Apr-1940 ? ? ?Today's Date: 05/23/2021 ?OT Individual Time: 3235-5732 ?OT Individual Time Calculation (min): 58 min  ? ?Short Term Goals: ?Week 3:  OT Short Term Goal 1 (Week 3): STG=LTG d/t ELOS ? ?Skilled Therapeutic Interventions/Progress Updates:  ?   ?Pt received in bed with no pain. HR stable.  ?ADL: ?Pt completes ADL at overall MIN Level. Skilled interventions include: VC for R attention during steering RW for in room functional mobility into and out of bathroom. MIN A for clothing management for continent bladder void (no BM). Pt able to bathe with MIN A for steadying to wash buttocks and cuing to attend to R visual field to locate needed ADL items/LHSS to wash feet. Pt completes UB dressing on TTB with MIN A to pull down back + VC to notice errors in orientation of shirt. Pt able to thread BLE and don socks EOB with MIN A to hold LE into ext rot position on bed for footwear and MIN A to advance part of pants past hips. Activity ideas/log for [facility] left in resource binder with not for family. ?Pt left at end of session in bed with exit alarm on, call light in reach and all needs met ? ? ?Therapy Documentation ?Precautions:  ?Precautions ?Precautions: Fall ?Precaution Comments: mild-mod Rt hemiparesis and Rt inattention ?Restrictions ?Weight Bearing Restrictions: No ?Other Position/Activity Restrictions: L hip pain from OA - improving ? ?Therapy/Group: Individual Therapy ? ?[name] ?05/23/2021, 6:48 AM ?

## 2021-05-23 NOTE — Progress Notes (Signed)
Speech Language Pathology Discharge Summary ? ?Patient Details  ?Name: Rita Yoder ?MRN: [id number] ?Date of Birth: 1940-03-31 ? ?Today's Date: 05/23/2021 ?SLP Individual Time: 7473-4037 ?SLP Individual Time Calculation (min): 30 min ? ? ?Skilled Therapeutic Interventions:  Skilled treatment session focused on cognitive-linguistic goals. Upon arrival, patient had not eaten her breakfast and reported, " I was waiting on you." SLP facilitated session by providing set-up assist with breakfast tray of regular textures with thin liquids. Patient required intermittent supervision level verbal cues for self-feeding but demonstrated significantly improved motor planning. Patient divided attention between self-feeding and a functional conversation regarding discharge with extra time and overall intermittent supervision level verbal cues for word-finding. Patient left upright in bed with alarm on and all needs within reach.  ? ?Patient has met 7 of 7 long term goals.  Patient to discharge at overall Min;Mod level.  ? ?Reasons goals not met: N/A  ? ?Clinical Impression/Discharge Summary: Patient has made functional gains and has met 7 of 7 LTGs this admission. Currently, patient demonstrates overall moderate cognitive deficits impacting functional problem solving, recall of daily information, visual scanning to right field of environment, and emergent awareness of deficits. Patient demonstrates improved word-finding and verbal expression at the phrase level requiring extra time and overall Min verbal cues but continues to demonstrate a mild-moderate aphasia with apraxia. Patient and family education is complete and patient will discharge home with 24 hour supervision from family. Patient would benefit from f/u home health SLP services to maximize her cognitive-linguistic functioning and overall functional independence in order to reduce caregiver burden.  ? ?Care  Partner:  ?Caregiver Able to Provide Assistance: Yes  ?Type of Caregiver Assistance: Physical;Cognitive ? ?Recommendation:  ?Home Health SLP;24 hour supervision/assistance  ?Rationale for SLP Follow Up: Reduce caregiver burden;Maximize cognitive function and independence;Maximize functional communication  ? ?Equipment: N/A  ? ?Reasons for discharge: Discharged from hospital;Treatment goals met  ? ?Patient/Family Agrees with Progress Made and Goals Achieved: Yes  ? ? ?Zhane Donlan ?05/23/2021, 6:27 AM ? ?

## 2021-05-23 NOTE — Progress Notes (Signed)
Patient ID: Rita Yoder, female   DOB: 30-Oct-1940, 81 y.o.   MRN: [id number] ? ?SW received updates from [place] only a payment was made for the hospital bed.  ? ?SW spoke with pt son Izell [place] to discuss if he wanted other DME: RW and w/c. He indicated they were paying for all DME. SW informed will have a member from [employer] f/u. ?*Issue has been addressed per [place].  ? ? ?Loralee Pacas, MSW, LCSWA ?Office: [phone] ?Cell: [phone] ?Fax: [phone]  ?

## 2021-05-23 NOTE — Progress Notes (Signed)
Physical Therapy Session Note ? ?Patient Details  ?Name: Rita Yoder ?MRN: [id number] ?Date of Birth: 04-23-1940 ? ?Today's Date: 05/23/2021 ?PT Individual Time: 1100-1200 ?PT Individual Time Calculation (min): 60 min  ? ?Short Term Goals: ? ?Week 3:  PT Short Term Goal 1 (Week 3): STG=LTG due to ELOS. ? ?Skilled Therapeutic Interventions/Progress Updates:  ? ?Pt received supine in bed and agreeable to PT. Supine>sit transfer with CGA  and cues for improved BLE position once sitting EOB. Pt performed stand pivot transfer to [facility] with supervision assist and RW. ? ?PT attempted to have pt perform dynamic balance training while engaged in wii sports, but pt reports visual deficits from hospitalization limit ability to see TV.  ? ?PT instructed pt in HEP of modified [place] level A: hip abduction x 8, mini squat x 10, LAQ x8 , sit<>stand x 5 supervision assist from PT to improve ROM and understanding of direction concentric motion and decreased compensation in trunk.  ?   ?Pt returned to room and performed ambulatory transfer to bed with RW and supervision assist from PT for safety x 8f. Sit>supine completed with mod assist at BLE, and left supine in bed with call bell in reach and all needs met.  ? ?   ? ?Therapy Documentation ?Precautions:  ?Precautions ?Precautions: Fall ?Precaution Comments: mild-mod Rt hemiparesis and Rt inattention ?Restrictions ?Weight Bearing Restrictions: No ?Other Position/Activity Restrictions: L hip pain from OA - improving ? ?  ?Vital Signs: ?Therapy Vitals ?Temp: 97.7 ?F (36.5 ?C) ?Temp Source: Oral ?Pulse Rate: (!) 58 ?Resp: 16 ?BP: (!) 141/59 ?Patient Position (if appropriate): Lying ?Oxygen Therapy ?SpO2: 100 % ?O2 Device: Room Air ?Pain: ?denies ? ? ? ?Therapy/Group: Individual Therapy ? ?ALorie Phenix?05/23/2021, 4:54 PM  ?

## 2021-05-23 NOTE — Progress Notes (Signed)
?                                                       PROGRESS NOTE ? ? ?Subjective/Complaints: ?Pt in good spirits. About to start working with SLP. Denied any problems yesterday ? ?ROS: Patient denies fever, rash, sore throat, blurred vision, dizziness, nausea, vomiting, diarrhea, cough, shortness of breath or chest pain, joint or back/neck pain, headache, or mood change.  ? ? ?Objective: ?  ?No results found. ?No results for input(s): WBC, HGB, HCT, PLT in the last 72 hours. ? ? ? ?No results for input(s): NA, K, CL, CO2, GLUCOSE, BUN, CREATININE, CALCIUM in the last 72 hours. ? ? ? ?Intake/Output Summary (Last 24 hours) at 05/23/2021 1015 ?Last data filed at 05/22/2021 1823 ?Gross per 24 hour  ?Intake 520 ml  ?Output --  ?Net 520 ml  ? ?  ? ?  ? ?Physical Exam: ?Vital Signs ?Blood pressure (!) 127/57, pulse (!) 55, temperature 97.8 ?F (36.6 ?C), resp. rate 18, height '5\' 6"'$  (1.676 m), weight 95.8 kg, SpO2 100 %. ? ?Constitutional: No distress . Vital signs reviewed. ?HEENT: NCAT, EOMI, oral membranes moist ?Neck: supple ?Cardiovascular: reg brady without murmur. No JVD    ?Respiratory/Chest: CTA Bilaterally without wheezes or rales. Normal effort    ?GI/Abdomen: BS +, non-tender, non-distended ?Ext: no clubbing, cyanosis, or edema ?Psych: pleasant and cooperative  ?Skin: No evidence of breakdown, no evidence of rash ?Musculoskeletal:  ?   moving all 4 limbs. No pain today ?Neuro:  ?Very alert and oriented.  Right central 7, speech clear. Still some word finding deficits but works through it.   RUE 4- to 4+/5. RLE 3-4/5 LUE/LLE 4+/5. Decreased light touch on right .  Decreased LT,  stocking glove sensory loss  below knees bilaterally. DTR's 1+ ?  ?  ?Assessment/Plan: ?1. Functional deficits which require 3+ hours per day of interdisciplinary therapy in a comprehensive inpatient rehab setting. ?Physiatrist is providing close team supervision and 24 hour management of active medical problems listed  below. ?Physiatrist and rehab team continue to assess barriers to discharge/monitor patient progress toward functional and medical goals ? ?Care Tool: ? ?Bathing ?   ?Body parts bathed by patient: Right arm, Right lower leg, Left arm, Left lower leg, Chest, Face, Abdomen, Front perineal area, Buttocks, Right upper leg, Left upper leg  ? Body parts bathed by helper: Right arm, Left arm, Chest, Abdomen, Front perineal area, Buttocks, Right upper leg, Left upper leg, Left lower leg, Right lower leg, Face ?  ?  ?Bathing assist Assist Level: Minimal Assistance - Patient > 75% ?  ?  ?Upper Body Dressing/Undressing ?Upper body dressing   ?What is the patient wearing?: Pull over shirt ?   ?Upper body assist Assist Level: Minimal Assistance - Patient > 75% ?   ?Lower Body Dressing/Undressing ?Lower body dressing ? ? ?   ?What is the patient wearing?: Incontinence brief, Pants ? ?  ? ?Lower body assist Assist for lower body dressing: Minimal Assistance - Patient > 75% ?   ? ?Toileting ?Toileting    ?Toileting assist Assist for toileting: Moderate Assistance - Patient 50 - 74% ?  ?  ?Transfers ?Chair/bed transfer ? ?Transfers assist ? Chair/bed transfer activity did not occur: Safety/medical concerns ? ?Chair/bed transfer assist level: Contact Guard/Touching assist ?  Chair/bed transfer assistive device: Walker ?  ?Locomotion ?Ambulation ? ? ?Ambulation assist ? ? Ambulation activity did not occur: Safety/medical concerns ? ?Assist level: Minimal Assistance - Patient > 75% ?Assistive device: Walker-rolling ?Max distance: 25 ft  ? ?Walk 10 feet activity ? ? ?Assist ? Walk 10 feet activity did not occur: Safety/medical concerns ? ?Assist level: Minimal Assistance - Patient > 75% ?Assistive device: Walker-rolling  ? ?Walk 50 feet activity ? ? ?Assist Walk 50 feet with 2 turns activity did not occur: Safety/medical concerns ? ?  ?   ? ? ?Walk 150 feet activity ? ? ?Assist Walk 150 feet activity did not occur: Safety/medical  concerns ? ?  ?  ?  ? ?Walk 10 feet on uneven surface  ?activity ? ? ?Assist Walk 10 feet on uneven surfaces activity did not occur: Safety/medical concerns ? ? ?  ?   ? ?Wheelchair ? ? ? ? ?Assist Is the patient using a wheelchair?: Yes ?Type of Wheelchair: Manual ?Wheelchair activity did not occur: Safety/medical concerns ? ?  ?   ? ? ?Wheelchair 50 feet with 2 turns activity ? ? ? ?Assist ? ?  ?Wheelchair 50 feet with 2 turns activity did not occur: Safety/medical concerns ? ? ?   ? ?Wheelchair 150 feet activity  ? ? ? ?Assist ? Wheelchair 150 feet activity did not occur: Safety/medical concerns ? ? ?   ? ?Blood pressure (!) 127/57, pulse (!) 55, temperature 97.8 ?F (36.6 ?C), resp. rate 18, height '5\' 6"'$  (1.676 m), weight 95.8 kg, SpO2 100 %. ? ?Medical Problem List and Plan: ?1. Functional deficits secondary to acute multifocal infarct including left ischemic thalamus, left parietal cortical and subcortical infarcts ?    -Continue CIR therapies including PT, OT, and SLP  ?-looks to be on track for 3/17  ?2.  Antithrombotics: ?-DVT/anticoagulation:  Pharmaceutical: Other (comment) Eliquis ?            -antiplatelet therapy: N/A ?3. Pain/left shoulder girdle pain: continue kpad ? -muscle rub for shoulder girdle has helped ?  -pain improved ?4. Mood: Provide emotional support ?            -antipsychotic agents: N/A ?5. Neuropsych: This patient is not capable of making decisions on her own behalf. ?6. Skin/Wound Care: Routine skin checks ?7. Fluids/Electrolytes/Nutrition: Routine in and outs ?8.  New onset atrial fibrillation with RVR.  Amiodarone 100 mg daily.  Follow-up cardiology services.    ? 3/16-bradycardia, EKG in sinus brady  ? -amiodarone increased to '200mg'$  bid by cardiology--they will follow up as outpt and adjust amio as needed ? -continue current norvasc ?9.  Hypertension.  Norvasc 10 mg daily, Imdur 60 mg daily.    ? -HCTZ still on hold d/t #11 ? 3/16: controlled with norvasc and imdur ?Vitals:  ?  05/22/21 1922 05/23/21 0456  ?BP: 136/62 (!) 127/57  ?Pulse: (!) 53 (!) 55  ?Resp: 18 18  ?Temp: 98.1 ?F (36.7 ?C) 97.8 ?F (36.6 ?C)  ?SpO2: 100% 100%  ?  ? ?10.  Suspicion for focal seizure.  Continue Keppra 500 mg twice daily.  EEG not completed due to glued wig could not be removed. ?11.  AKI on CKD stage IIIB- GFR 20.  Marland Kitchen  HCTZ on hold. ? 3/13 BUN/Cr improved--near baseline ?   ?BMP Latest Ref Rng & Units 05/20/2021 05/19/2021 05/18/2021  ?Glucose 70 - 99 mg/dL 84 95 159(H)  ?BUN 8 - 23 mg/dL 46(H) 43(H) 45(H)  ?Creatinine 0.44 -  1.00 mg/dL 3.24(H) 3.18(H) 3.22(H)  ?BUN/Creat Ratio 12 - 28 - - -  ?Sodium 135 - 145 mmol/L 138 136 140  ?Potassium 3.5 - 5.1 mmol/L 4.7 4.3 5.4(H)  ?Chloride 98 - 111 mmol/L 112(H) 112(H) 113(H)  ?CO2 22 - 32 mmol/L 19(L) 16(L) 16(L)  ?Calcium 8.9 - 10.3 mg/dL 9.0 8.8(L) 9.5  ?  ? ?12.  Hyperlipidemia.  Continue Lipitor ?13.  Hypothyroidism.  Synthroid ?14.  Normocytic anemia/monoclonal gammopathy.  Follow-up hematology services Dr.Kale ?15.  Constipation.  MiraLAX twice daily.  Senokot daily. ?16.  Suspect UTI.  treated. ?17.Type 2 diabetes mellitus.HG  AIC 4.5    ?18. Transaminitis: tylenol d/ced ?19. Leukocytosis:    ?  Resolved, 9.1 3/6 ? ?LOS: ?21 days ?A FACE TO FACE EVALUATION WAS PERFORMED ? ?Meredith Staggers ?05/23/2021, 10:15 AM  ? ?   ?

## 2021-05-24 ENCOUNTER — Other Ambulatory Visit (HOSPITAL_COMMUNITY): Payer: Self-pay

## 2021-05-24 NOTE — Discharge Summary (Signed)
Physician Discharge Summary  ?Patient ID: ?Rita Yoder ?MRN: [id number] ?DOB/AGE: [birth year] 82 y.o. ? ?Admit date: 05/02/2021 ?Discharge date: 05/24/2021 ? ?Discharge Diagnoses:  ?Principal Problem: ?  Subcortical infarction [facility]) ?Active Problems: ?  AKI (acute kidney injury) ([facility]) ?  Right-sided visual neglect ?  Hyperglycemia ?New onset atrial fibrillation with RVR ?Hypertension ?Suspicion of focal seizure ?Hyperlipidemia ?Hypothyroidism ?Normocytic anemia/monoclonal gammopathy ?Type 2 diabetes mellitus ?Transaminitis ?Nonobstructive CAD ? ?Discharged Condition: Stable ? ?Significant Diagnostic Studies: ?No results found. ? ?Labs:  ?Basic Metabolic Panel: ?Recent Labs  ?Lab 05/18/21 ?0954 05/19/21 ?6720 05/20/21 ?9470  ?NA 140 136 138  ?K 5.4* 4.3 4.7  ?CL 113* 112* 112*  ?CO2 16* 16* 19*  ?GLUCOSE 159* 95 84  ?BUN 45* 43* 46*  ?CREATININE 3.22* 3.18* 3.24*  ?CALCIUM 9.5 8.8* 9.0  ? ? ?CBC: ?Recent Labs  ?Lab 05/18/21 ?0954 05/20/21 ?9628  ?WBC 9.1 9.6  ?HGB 10.0* 9.5*  ?HCT 32.0* 29.3*  ?MCV 102.9* 100.7*  ?PLT 229 180  ? ? ?CBG: ?Recent Labs  ?Lab 05/18/21 ?0931  ?GLUCAP 163*  ? ?Family history.  Brother with myocardial infarction.  Maternal aunt with cancer of unknown.  Denies any colon cancer esophageal cancer or rectal cancer ? ?Brief HPI:   Rita Yoder is a 81 y.o. right-handed female with history of nonobstructive CAD hypertension hypothyroidism type 2 diabetes mellitus hyperlipidemia normocytic anemia with monoclonal gammopathy followed by Dr.Kale, CKD stage III.  Per chart review independent prior to admission occasional use of a cane.  Presented 04/23/2021 with acute onset of right-sided weakness and word finding difficulties.  Cranial CT scan showed 8 mm hypodensity left thalamus compatible with infarction of indeterminate age.  No acute hemorrhage.  MRA head and neck no intracranial large vessel occlusion identified.  Multiple sites of moderate severe stenosis within P2 left PCA.  Severe  stenosis within the mid to distal M1 left MCA.  MRI follow-up small patchy acute cortical subcortical infarct within the left parietal lobe.  Background moderate to moderately advanced chronic small vessel ischemic changes within the cerebral white matter additionally, clustered chronic parenchymal microhemorrhages present within the right parietal occipital lobe.  Patient did not receive tPA.  Admission chemistries unremarkable except glucose 160 BUN 49 creatinine 3.13 alcohol negative hemoglobin A1c 4.5 TSH 5.275.  Echocardiogram ejection fraction of 60 to 65% no wall motion abnormalities grade 1 diastolic dysfunction.  Neurology follow-up due to concerns for microhemorrhages DAPT not recommended initially placed on aspirin.  Patient did develop some right hand twitching concerning for focal seizure loaded with Keppra with resolution of tremor.  EEG not performed due to glued wig in place.  Hospital course PAF with RVR of new onset received IV metoprolol subsequently had episode of bradycardia with heart rate in the 50s with cardiology service consulted.  She was cleared to begin Eliquis for both atrial fibrillation and CVA prophylaxis.  Cardiac rate remained controlled maintained on amiodarone.  AKI on CKD baseline creatinine 2.8 her HCTZ was held.  Tolerating a regular diet.  Therapy evaluations completed due to patient decreased functional mobility right-sided weakness was admitted for a comprehensive rehab program. ? ? ?Hospital Course: Rita Yoder was admitted to rehab 05/02/2021 for inpatient therapies to consist of PT, ST and OT at least three hours five days a week. Past admission physiatrist, therapy team and rehab RN have worked together to provide customized collaborative inpatient rehab.  Pertaining to patient's acute multifocal infarct including left ischemic thalamus left parietal cortical subcortical  infarct remained stable follow-up neurology services maintained on Eliquis for both CVA  prophylax as well as new onset atrial fibrillation cardiac rate controlled.  In regards to patient new onset atrial fibrillation she did have bouts of dizziness associated with racing of heartbeat EKG showed A-fib with moderate ventricular response nonspecific ST-T wave changes.  She denied any agonal chest pain or shortness of breath.  Cardiology service again consulted in regards to her atrial fibrillation and her amiodarone was increased to 200 mg twice daily that she will continue on for now and once converted to sinus rhythm could then reduce amiodarone back to daily...    Hospital course suspicion for focal seizure Keppra 500 twice daily EEG not completed due to glued wig cannot be removed follow-up neurology services.  AKI on CKD HCTZ held she did receive a 500 mL fluid bolus with latest creatinine 3.24 felt to be baseline.  History of hyperlipidemia Lipitor ongoing.  Synthroid for hypothyroidism.  History of normocytic anemia monoclonal gammopathy followed outpatient by hematology services.  Blood sugars overall controlled type 2 diabetes mellitus hemoglobin A1c 4.5.   ? ? ?Rehab course: During patient's stay in rehab weekly team conferences were held to monitor patient's progress, set goals and discuss barriers to discharge. At admission, patient required +2 physical assist sit to stand moderate assist sit to supine mod max assist step pivot transfers ? ?Physical exam.  Blood pressure 161/56 pulse 61 temperature 97.6 respirations 20 oxygen saturations 100% room air ?Constitutional.  No acute distress ?HEENT ?Head.  Normocephalic and atraumatic ?Eyes.  Pupils round and reactive to light no discharge without nystagmus ?Neck.  Supple nontender no JVD without thyromegaly ?Cardiac regular rate rhythm no extra sounds or murmur heard ?Abdomen.  Soft nontender positive bowel sounds without rebound ?Respiratory effort normal no respiratory distress without wheeze ?Musculoskeletal. ?Comments.  Right upper extremity 4  -/5 but hard to test due to right side neglect ?Right lower extremity 3+/5 again neglect makes it difficult to test ?Left upper extremity/left lower extremity 5 -/5 ?Neurologic.  Alert right side neglect she was aphasic but could utter some simple words.  Left gaze preference. ? ? ?She  has had improvement in activity tolerance, balance, postural control as well as ability to compensate for deficits. She has had improvement in functional use RUE/LUE  and RLE/LLE as well as improvement in awareness.  Supine to sit with bed features cues for positioning.  Patient performed sit to stand with cues for hand placement and initiation with contact-guard.  Stand step transfer to wheelchair contact-guard and cues for positioning.  Ambulates 75-100 feet rolling walker contact-guard.  Bed mobility min mod for supine to sit and scoot up in bed with assistance.  SLP follow-up patient made functional gains with aphasia verbally expressing basic wants and needs.  Minimal verbal cues for orientation and sustained attention.  Full family teaching completed plan discharged to home ? ?Disposition: Discharge to home ? ? ?Diet: Regular ? ?Special Instructions: No driving smoking or alcohol ? ?Allergies as of 05/24/2021   ? ?   Reactions  ? Losartan Other (See Comments)  ? MD discontinued this because of decreased kidney function  ? ?  ? ?  ?Medication List  ?  ? ?STOP taking these medications   ? ?cephALEXin 500 MG capsule ?Commonly known as: KEFLEX ?  ?levETIRAcetam 500 MG tablet ?Commonly known as: KEPPRA ?Replaced by: levETIRAcetam 100 MG/ML solution ?  ?polyethylene glycol powder 17 GM/SCOOP powder ?Commonly known as: GLYCOLAX/MIRALAX ?Replaced  by: polyethylene glycol 17 g packet ?  ?Vitamin D3 50 MCG (2000 UT) capsule ?Replaced by: Vitamin D 50 MCG (2000 UT) tablet ?  ? ?  ? ?TAKE these medications   ? ?acetaminophen 500 MG tablet ?Commonly known as: TYLENOL ?Take 1 tablet (500 mg total) by mouth every 6 (six) hours as needed for  headache or mild pain. ?  ?amiodarone 200 MG tablet ?Commonly known as: PACERONE ?Take 1/2 tablets (100 mg total) by mouth daily. ?  ?amLODipine 10 MG tablet ?Commonly known as: NORVASC ?Take 1 tablet (10 mg tot

## 2021-05-24 NOTE — Consult Note (Signed)
[place] [facility] [ACO] Patient:  [employer] ? ?Primary Care Provider:  Glendale Chard, MD ?  is an embedded provider with a Chronic Care Management team and program, and is listed for the transition of care follow up and appointments. ? ?Patient was screened for Embedded practice service needs for chronic care management ? ?Plan: A referral request has been sent to the [facility] and made aware of TOC needs for post hospital needs. ? ?Please contact for further questions, ? ?Natividad Brood, RN BSN CCM ?[facility] Liaison ? [phone] business mobile phone ?Toll free office [phone]  ?Fax number: [phone] ?[email or web address] ? ? ?

## 2021-05-24 NOTE — Progress Notes (Signed)
Inpatient Rehabilitation Care Coordinator ?Discharge Note  ? ?Patient Details  ?Name: Rita Yoder ?MRN: [id number] ?Date of Birth: 1940-10-10 ? ? ?Discharge location: D/c to home ? ?Length of Stay: 21 days ? ?Discharge activity level: ambulatory level Min Assist- Supervision with a RW ? ?Home/community participation: Limited ? ?Patient response XN:[id number] Literacy - How often do you need to have someone help you when you read instructions, pamphlets, or other written material from your doctor or pharmacy?: Never ? ?Patient response CB:SWHQPR Isolation - How often do you feel lonely or isolated from those around you?: Never ? ?Services provided included: MD, PT, OT, SLP, Pharmacy, Neuropsych, SW, TR, RN, CM, RD ? ?Financial Services:  ?Charity fundraiser Utilized: Private Insurance ?UHC Medicare ? ?Choices offered to/list presented to: Yes ? ?Follow-up services arranged:  ?Home Health, DME ?Home Health Agency: [facility] for HHPT/OT/SLP/aide  ?  ?DME : [facility] for hospital bed, w/c and RW ?  ? ?Patient response to transportation need: ?Is the patient able to respond to transportation needs?: Yes ?In the past 12 months, has lack of transportation kept you from medical appointments or from getting medications?: No ?In the past 12 months, has lack of transportation kept you from meetings, work, or from getting things needed for daily living?: No ? ?Comments (or additional information): ? ?Patient/Family verbalized understanding of follow-up arrangements:  Yes ? ?Individual responsible for coordination of the follow-up plan: contact pt son Izell [place] [phone] ? ?Confirmed correct DME delivered: Rana Snare 05/24/2021   ? ?Rana Snare ?

## 2021-05-24 NOTE — Progress Notes (Signed)
?                                                       PROGRESS NOTE ? ? ?Subjective/Complaints: ?In good spirits. Excited about going home! ? ?ROS: Patient denies fever, rash, sore throat, blurred vision, dizziness, nausea, vomiting, diarrhea, cough, shortness of breath or chest pain, joint or back/neck pain, headache, or mood change.  ? ? ?Objective: ?  ?No results found. ?No results for input(s): WBC, HGB, HCT, PLT in the last 72 hours. ? ? ? ?No results for input(s): NA, K, CL, CO2, GLUCOSE, BUN, CREATININE, CALCIUM in the last 72 hours. ? ? ? ?Intake/Output Summary (Last 24 hours) at 05/24/2021 1029 ?Last data filed at 05/23/2021 2005 ?Gross per 24 hour  ?Intake 360 ml  ?Output --  ?Net 360 ml  ? ?  ? ?  ? ?Physical Exam: ?Vital Signs ?Blood pressure (!) 136/58, pulse (!) 53, temperature 97.9 ?F (36.6 ?C), resp. rate 16, height '5\' 6"'$  (1.676 m), weight 95.8 kg, SpO2 100 %. ? ?Constitutional: No distress . Vital signs reviewed. ?HEENT: NCAT, EOMI, oral membranes moist ?Neck: supple ?Cardiovascular: RRR without murmur. No JVD    ?Respiratory/Chest: CTA Bilaterally without wheezes or rales. Normal effort    ?GI/Abdomen: BS +, non-tender, non-distended ?Ext: no clubbing, cyanosis, or edema ?Psych: pleasant and cooperative  ?Skin: No evidence of breakdown, no evidence of rash ?Musculoskeletal:  ?   moving all 4 limbs. No pain today ?Neuro:  ?Very alert and oriented.  Right central 7, speech clear. Still some word finding deficits but works through it.   RUE 4- to 4+/5. RLE 3-4/5 LUE/LLE 4+/5. Decreased light touch on right .  Decreased LT,  stocking glove sensory loss  below knees bilaterally. DTR's 1+ ?  ?  ?Assessment/Plan: ?1. Functional deficits which require 3+ hours per day of interdisciplinary therapy in a comprehensive inpatient rehab setting. ?Physiatrist is providing close team supervision and 24 hour management of active medical problems listed below. ?Physiatrist and rehab team continue to assess barriers  to discharge/monitor patient progress toward functional and medical goals ? ?Care Tool: ? ?Bathing ?   ?Body parts bathed by patient: Right arm, Right lower leg, Left arm, Left lower leg, Chest, Face, Abdomen, Front perineal area, Buttocks, Right upper leg, Left upper leg  ? Body parts bathed by helper: Right arm, Left arm, Chest, Abdomen, Front perineal area, Buttocks, Right upper leg, Left upper leg, Left lower leg, Right lower leg, Face ?  ?  ?Bathing assist Assist Level: Minimal Assistance - Patient > 75% ?  ?  ?Upper Body Dressing/Undressing ?Upper body dressing   ?What is the patient wearing?: Pull over shirt ?   ?Upper body assist Assist Level: Minimal Assistance - Patient > 75% ?   ?Lower Body Dressing/Undressing ?Lower body dressing ? ? ?   ?What is the patient wearing?: Incontinence brief, Pants ? ?  ? ?Lower body assist Assist for lower body dressing: Minimal Assistance - Patient > 75% ?   ? ?Toileting ?Toileting    ?Toileting assist Assist for toileting: Moderate Assistance - Patient 50 - 74% ?  ?  ?Transfers ?Chair/bed transfer ? ?Transfers assist ? Chair/bed transfer activity did not occur: Safety/medical concerns ? ?Chair/bed transfer assist level: Supervision/Verbal cueing ?Chair/bed transfer assistive device: Walker ?  ?Locomotion ?Ambulation ? ? ?  Ambulation assist ? ? Ambulation activity did not occur: Safety/medical concerns ? ?Assist level: Contact Guard/Touching assist ?Assistive device: Walker-rolling ?Max distance: 100 ft  ? ?Walk 10 feet activity ? ? ?Assist ? Walk 10 feet activity did not occur: Safety/medical concerns ? ?Assist level: Supervision/Verbal cueing ?Assistive device: Walker-rolling  ? ?Walk 50 feet activity ? ? ?Assist Walk 50 feet with 2 turns activity did not occur: Safety/medical concerns ? ?Assist level: Contact Guard/Touching assist ?Assistive device: Walker-rolling  ? ? ?Walk 150 feet activity ? ? ?Assist Walk 150 feet activity did not occur: Safety/medical  concerns ? ?Assist level: Contact Guard/Touching assist ?Assistive device: Walker-rolling ?  ? ?Walk 10 feet on uneven surface  ?activity ? ? ?Assist Walk 10 feet on uneven surfaces activity did not occur: Safety/medical concerns ? ? ?  ?   ? ?Wheelchair ? ? ? ? ?Assist Is the patient using a wheelchair?: Yes ?Type of Wheelchair: Manual ?Wheelchair activity did not occur: Safety/medical concerns ? ?Wheelchair assist level: Dependent - Patient 0% (limited by body habitus) ?Max wheelchair distance: >150 ft  ? ? ?Wheelchair 50 feet with 2 turns activity ? ? ? ?Assist ? ?  ?Wheelchair 50 feet with 2 turns activity did not occur: Safety/medical concerns ? ? ?Assist Level: Dependent - Patient 0%  ? ?Wheelchair 150 feet activity  ? ? ? ?Assist ? Wheelchair 150 feet activity did not occur: Safety/medical concerns ? ? ?Assist Level: Dependent - Patient 0%  ? ?Blood pressure (!) 136/58, pulse (!) 53, temperature 97.9 ?F (36.6 ?C), resp. rate 16, height '5\' 6"'$  (1.676 m), weight 95.8 kg, SpO2 100 %. ? ?Medical Problem List and Plan: ?1. Functional deficits secondary to acute multifocal infarct including left ischemic thalamus, left parietal cortical and subcortical infarcts ?    dc home today ?    F/u with [facility] in 3-4 weeks, pcp, neurology ?2.  Antithrombotics: ?-DVT/anticoagulation:  Pharmaceutical: Other (comment) Eliquis ?            -antiplatelet therapy: N/A ?3. Pain/left shoulder girdle pain: continue kpad ? -muscle rub for shoulder girdle has helped ?  -pain improved ?4. Mood: Provide emotional support ?            -antipsychotic agents: N/A ?5. Neuropsych: This patient is not capable of making decisions on her own behalf. ?6. Skin/Wound Care: Routine skin checks ?7. Fluids/Electrolytes/Nutrition: Routine in and outs ?8.  New onset atrial fibrillation with RVR.  Amiodarone 100 mg daily.  Follow-up cardiology services.    ? 3/16-bradycardia, EKG in sinus brady  ? -amiodarone increased to '200mg'$  bid by cardiology--they will  follow up as outpt and adjust amio as needed ? -continue current norvasc ?9.  Hypertension.  Norvasc 10 mg daily, Imdur 60 mg daily.    ? -HCTZ still on hold d/t #11 ? 3/17: controlled with norvasc and imdur ?Vitals:  ? 05/23/21 2005 05/24/21 0532  ?BP: (!) 145/52 (!) 136/58  ?Pulse: (!) 56 (!) 53  ?Resp: 18 16  ?Temp: 97.9 ?F (36.6 ?C) 97.9 ?F (36.6 ?C)  ?SpO2: 97% 100%  ?  ? ?10.  Suspicion for focal seizure.  Continue Keppra 500 mg twice daily.  EEG not completed due to glued wig could not be removed. ?11.  AKI on CKD stage IIIB- GFR 20.  Marland Kitchen  HCTZ on hold. ? BUN/Cr improved--near baseline ?   ?BMP Latest Ref Rng & Units 05/20/2021 05/19/2021 05/18/2021  ?Glucose 70 - 99 mg/dL 84 95 159(H)  ?BUN 8 -  23 mg/dL 46(H) 43(H) 45(H)  ?Creatinine 0.44 - 1.00 mg/dL 3.24(H) 3.18(H) 3.22(H)  ?BUN/Creat Ratio 12 - 28 - - -  ?Sodium 135 - 145 mmol/L 138 136 140  ?Potassium 3.5 - 5.1 mmol/L 4.7 4.3 5.4(H)  ?Chloride 98 - 111 mmol/L 112(H) 112(H) 113(H)  ?CO2 22 - 32 mmol/L 19(L) 16(L) 16(L)  ?Calcium 8.9 - 10.3 mg/dL 9.0 8.8(L) 9.5  ?  ? ?12.  Hyperlipidemia.  Continue Lipitor ?13.  Hypothyroidism.  Synthroid ?14.  Normocytic anemia/monoclonal gammopathy.  Follow-up hematology services Dr.Kale ?15.  Constipation.  MiraLAX twice daily.  Senokot daily. ?16.  Suspect UTI.  treated. ?17.Type 2 diabetes mellitus.HG  AIC 4.5    ?18. Transaminitis: tylenol d/ced ?19. Leukocytosis:    ?  Resolved, 9.1 3/6 ? ?LOS: ?22 days ?A FACE TO FACE EVALUATION WAS PERFORMED ? ?Meredith Staggers ?05/24/2021, 10:29 AM  ? ?   ?

## 2021-05-27 ENCOUNTER — Telehealth: Payer: Self-pay

## 2021-05-27 NOTE — Telephone Encounter (Signed)
Transitional Care call-son Izell [place] ? ? ? ?Are you/is patient experiencing any problems since coming home? No Are there any questions regarding any aspect of care? No ?Are there any questions regarding medications administration/dosing? No Are meds being taken as prescribed? Yes Patient should review meds with caller to confirm ?Have there been any falls? No ?Has Home Health been to the house and/or have they contacted you? Yes If not, have you tried to contact them? Can we help you contact them? ?Are bowels and bladder emptying properly? Yes Are there any unexpected incontinence issues? No If applicable, is patient following bowel/bladder programs? ?Any fevers, problems with breathing, unexpected pain? No ?Are there any skin problems or new areas of breakdown? No ?Has the patient/family member arranged specialty MD follow up (ie cardiology/neurology/renal/surgical/etc)? Yes  Can we help arrange? ?Does the patient need any other services or support that we can help arrange? No ?Are caregivers following through as expected in assisting the patient? No ?Has the patient quit smoking, drinking alcohol, or using drugs as recommended? Yes ? ?Appointment time 9:20 am, arrive time 9:00 am with Danella Sensing, NP on 06/05/2021 then Dr. Naaman Plummer ?[address]   ?

## 2021-05-28 ENCOUNTER — Telehealth: Payer: Self-pay

## 2021-05-28 NOTE — Telephone Encounter (Signed)
Transition Care Management Follow-up Telephone Call ?Date of discharge and from where: 05/24/2021 [facility]  ?How have you been since you were released from the hospital? Pt sons reports she is doing pretty good.  ?Any questions or concerns? No ? ?Items Reviewed: ?Did the pt receive and understand the discharge instructions provided? Yes  ?Medications obtained and verified? Yes  ?Other? Yes  ?Any new allergies since your discharge? No  ?Dietary orders reviewed? Yes ?Do you have support at home? Yes  ? ?Home Care and Equipment/Supplies: ?Were home health services ordered? yes ?If so, what is the name of the agency? [facility]   ?Has the agency set up a time to come to the patient's home? yes ?Were any new equipment or medical supplies ordered?  Yes: wheel chair, walker, hospital bed ?What is the name of the medical supply agency? [facility]  ?Were you able to get the supplies/equipment? yes ?Do you have any questions related to the use of the equipment or supplies? No ? ?Functional Questionnaire: (I = Independent and D = Dependent) ?ADLs: d ? ?Bathing/Dressing- d ? ?Meal Prep- d ? ?Eating- d ? ?Maintaining continence- d ? ?Transferring/Ambulation- d ? ?Managing Meds- d ? ?Follow up appointments reviewed: ? ?PCP Hospital f/u appt confirmed? No  Scheduled to see N/A on N/A @ N/A. ?[facility] f/u appt confirmed? No  Scheduled to see N/A on N/A @ N/A. ?Are transportation arrangements needed? No  ?If their condition worsens, is the pt aware to call PCP or go to the Emergency Dept.? Yes ?Was the patient provided with contact information for the PCP's office or ED? Yes ?Was to pt encouraged to call back with questions or concerns? Yes  ?

## 2021-06-05 ENCOUNTER — Encounter: Payer: Self-pay | Admitting: Registered Nurse

## 2021-06-05 ENCOUNTER — Other Ambulatory Visit: Payer: Self-pay

## 2021-06-05 ENCOUNTER — Encounter: Payer: Medicare Other | Attending: Registered Nurse | Admitting: Registered Nurse

## 2021-06-05 VITALS — BP 132/73 | HR 69 | Ht 66.0 in

## 2021-06-05 DIAGNOSIS — R414 Neurologic neglect syndrome: Secondary | ICD-10-CM

## 2021-06-05 DIAGNOSIS — I480 Paroxysmal atrial fibrillation: Secondary | ICD-10-CM | POA: Diagnosis not present

## 2021-06-05 DIAGNOSIS — I639 Cerebral infarction, unspecified: Secondary | ICD-10-CM | POA: Diagnosis present

## 2021-06-05 DIAGNOSIS — I10 Essential (primary) hypertension: Secondary | ICD-10-CM | POA: Diagnosis not present

## 2021-06-05 NOTE — Progress Notes (Signed)
? ?Subjective:  ? ? Patient ID: Rita Yoder, female    DOB: [birth year], 81 y.o.   MRN: [id number] ? ?HPI: Rita Yoder is a 81 y.o. female who is here for Transitional Care Visit for follow up of her Subcortical Infarction, Right-Sided Visual Neglect, PAF ( Paroxysmal Atrial Fibrillation and Essential Hypertension. She presented to [facility] on 04/23/2021 with right sided weakness and word finding difficulties.  ?H&P: Dr Rita Yoder ?81 year old female who presents with intermittent right upper extremity paresthesias and weakness with associated expressive aphasia.  Patient states that symptoms started several hours ago and have been waxing and waning.  No prior history of same.  No associated headache or confusion.  When patient presented initially in triage she had developed some expressive aphasia.  Code stroke activated. ? ?CT Head: Code Stroke ?IMPRESSION: ?1. 8 mm hypodensity left thalamus compatible with infarct of ?indeterminate age. Possibly acute. No acute hemorrhage. ?2. ASPECTS is 10 ?3. Atrophy and moderate chronic microvascular ischemic change. ? ?MR Angio: Head ?IMPRESSION: ?No intracranial large vessel occlusion is identified. ?  ?Intracranial atherosclerotic disease with multifocal stenoses, most ?notably as follows. ?  ?Multiple sites of moderate/severe stenosis within the P2 left PCA. ?  ?Severe stenosis within the mid-to-distal M1 left MCA. ? ?Neurology Consulted ? ?Cardiology was consulted for new onset of PAF with RVR, she was cleared to begin Eliquuis for Atrial Fibrillation and CVA Prophylaxis. Continue current medication regimen.  ? ?Ms. Ravert was admitted to inpatient rehabilitation on 05/02/2021 and discharged home on 05/24/2021. She is receiving Home Health Therapy with [facility]. She denies any pain. She rates her pain 0. Also reports her appetite is improving.  ? ?Son in room, all question answered.  ? ?Arrived in wheelchair ? ?  ? ?Pain Inventory ?Average Pain  5 ?Pain Right Now 0 ?My pain is intermittent and aching ? ?LOCATION OF PAIN  Hip ? ?BOWEL ?Number of stools per week: 3 ?Oral laxative use Yes  ?Type of laxative Miralax ?Enema or suppository use No  ?History of colostomy No  ?Incontinent No  ? ?BLADDER ?Pads ?In and out cath, frequency -N/A ?Able to self cath No  ?Bladder incontinence Yes  ?Frequent urination No  ?Leakage with coughing Yes  ?Difficulty starting stream No  ?Incomplete bladder emptying No  ? ? ?Mobility ?walk with assistance ?use a walker ?ability to climb steps?  no ?do you drive?  no ?use a wheelchair ?needs help with transfers ?Do you have any goals in this area?  yes ? ?Function ?retired ?I need assistance with the following:  dressing, bathing, toileting, meal prep, household duties, and shopping ? ?Neuro/Psych ?weakness ?numbness ?tingling ?trouble walking ? ?Prior Studies ?Any changes since last visit?  no ? ?Physicians involved in your care ?Any changes since last visit?  no ? ? ?Family History  ?Problem Relation Age of Onset  ? Healthy Mother   ? Heart attack Brother   ? Cancer Maternal Aunt   ? Cancer Maternal Uncle   ? Heart Problems Maternal Grandmother   ? ?Social History  ? ?Socioeconomic History  ? Marital status: Married  ?  Spouse name: Not on file  ? Number of children: 4  ? Years of education: Not on file  ? Highest education level: Not on file  ?Occupational History  ? Occupation: retrired  ?Tobacco Use  ? Smoking status: Never  ? Smokeless tobacco: Never  ?Vaping Use  ? Vaping Use: Never used  ?Substance and Sexual Activity  ?  Alcohol use: No  ? Drug use: Never  ? Sexual activity: Not Currently  ?Other Topics Concern  ? Not on file  ?Social History Narrative  ? Not on file  ? ?Social Determinants of Health  ? ?Financial Resource Strain: Low Risk   ? Difficulty of Paying Living Expenses: Not hard at all  ?Food Insecurity: No Food Insecurity  ? Worried About Charity fundraiser in the Last Year: Never true  ? Ran Out of Food in the  Last Year: Never true  ?Transportation Needs: No Transportation Needs  ? Lack of Transportation (Medical): No  ? Lack of Transportation (Non-Medical): No  ?Physical Activity: Inactive  ? Days of Exercise per Week: 0 days  ? Minutes of Exercise per Session: 0 min  ?Stress: No Stress Concern Present  ? Feeling of Stress : Not at all  ?Social Connections: Not on file  ? ?Past Surgical History:  ?Procedure Laterality Date  ? CATARACT EXTRACTION Bilateral 12/2020  ? CHOLECYSTECTOMY    ? HERNIA REPAIR    ? ROTATOR CUFF REPAIR  03/10/1992  ? THYROID SURGERY    ? TONSILLECTOMY    ? TOTAL ABDOMINAL HYSTERECTOMY    ? ?Past Medical History:  ?Diagnosis Date  ? Chronic kidney disease 12/2009  ? Diabetes mellitus 2010  ? Dyslipidemia   ? Hypertension   ? Monoclonal gammopathy 03/2010  ? ?BP 132/73   Pulse 69   Ht '5\' 6"'$  (1.676 m)   SpO2 95%   BMI 34.09 kg/m?  ? ?Opioid Risk Score:   ?Fall Risk Score:  `1 ? ?Depression screen PHQ 2/9 ? ? ?  06/05/2021  ?  9:12 AM 12/20/2020  ? 12:19 PM 11/30/2019  ?  2:30 PM 11/30/2019  ?  2:07 PM 11/18/2018  ?  2:26 PM 01/20/2018  ? 11:14 AM  ?Depression screen PHQ 2/9  ?Decreased Interest 0 0 0 0 0 0  ?Down, Depressed, Hopeless 0 0 0 0 0 0  ?PHQ - 2 Score 0 0 0 0 0 0  ?Altered sleeping 0    0   ?Tired, decreased energy 2    0   ?Change in appetite 2    0   ?Feeling bad or failure about yourself  0    0   ?Trouble concentrating     0   ?Moving slowly or fidgety/restless 0    0   ?Suicidal thoughts 0    0   ?PHQ-9 Score 4    0   ?Difficult doing work/chores     Not difficult at all   ?  ?Review of Systems  ?Eyes:  Positive for visual disturbance.  ?Respiratory:  Positive for shortness of breath.   ?Cardiovascular:  Positive for chest pain and leg swelling.  ?Gastrointestinal:  Positive for constipation.  ?Genitourinary:  Positive for frequency.  ?     INCONTINENCE ?  ?Musculoskeletal:  Positive for gait problem.  ?Neurological:  Positive for dizziness, weakness and numbness.  ?     TINGLING  ?All  other systems reviewed and are negative. ? ?   ?Objective:  ? Physical Exam ?Vitals and nursing note reviewed.  ?Constitutional:   ?   Appearance: Normal appearance.  ?Cardiovascular:  ?   Rate and Rhythm: Normal rate and regular rhythm.  ?   Pulses: Normal pulses.  ?   Heart sounds: Normal heart sounds.  ?Pulmonary:  ?   Effort: Pulmonary effort is normal.  ?   Breath sounds: Normal  breath sounds.  ?Musculoskeletal:  ?   Cervical back: Normal range of motion and neck supple.  ?   Comments: Normal Muscle Bulk and Muscle Testing Reveals: ? Upper Extremities: Right: Decreased ROM 30 Degrees and Muscle Strength 4/5 ?Left Upper Extremity: Full ROM and Muscle Strength 4/5 ?Lower Extremities: Right Decreased ROM and Muscle Strength 4/5 ?Left Lower Extremity: Full ROM and Muscle Strength 5/5 ?Arrived in wheelchair ?  ?Skin: ?   General: Skin is warm and dry.  ?Neurological:  ?   Mental Status: She is alert and oriented to person, place, and time.  ?Psychiatric:     ?   Mood and Affect: Mood normal.     ?   Behavior: Behavior normal.  ? ? ? ? ?   ?Assessment & Plan:  ?1.Subcortical Infarction,/Right-Sided Visual Neglect: Has a scheduled appointment with Neurology. Continue Home Health Therapy with [facility].  ?2. PAF ( Paroxysmal Atrial Fibrillation: Continue current medication regimen. Cardiology Following.  ?3, Essential Hypertension. Continue current medication regimen. PCP Following.  ? ?F/U in 4- 6 weeks with Dr Naaman Plummer  ? ?

## 2021-06-13 ENCOUNTER — Encounter: Payer: Self-pay | Admitting: Internal Medicine

## 2021-06-13 ENCOUNTER — Ambulatory Visit (INDEPENDENT_AMBULATORY_CARE_PROVIDER_SITE_OTHER): Payer: Medicare Other | Admitting: Internal Medicine

## 2021-06-13 VITALS — BP 124/80 | HR 73 | Temp 98.3°F | Ht 66.0 in | Wt 198.0 lb

## 2021-06-13 DIAGNOSIS — I639 Cerebral infarction, unspecified: Secondary | ICD-10-CM | POA: Diagnosis not present

## 2021-06-13 DIAGNOSIS — R569 Unspecified convulsions: Secondary | ICD-10-CM

## 2021-06-13 DIAGNOSIS — Z6831 Body mass index (BMI) 31.0-31.9, adult: Secondary | ICD-10-CM

## 2021-06-13 DIAGNOSIS — N184 Chronic kidney disease, stage 4 (severe): Secondary | ICD-10-CM

## 2021-06-13 DIAGNOSIS — I700 Atherosclerosis of aorta: Secondary | ICD-10-CM

## 2021-06-13 DIAGNOSIS — Z79899 Other long term (current) drug therapy: Secondary | ICD-10-CM

## 2021-06-13 DIAGNOSIS — E6609 Other obesity due to excess calories: Secondary | ICD-10-CM

## 2021-06-13 DIAGNOSIS — I129 Hypertensive chronic kidney disease with stage 1 through stage 4 chronic kidney disease, or unspecified chronic kidney disease: Secondary | ICD-10-CM

## 2021-06-13 DIAGNOSIS — R414 Neurologic neglect syndrome: Secondary | ICD-10-CM | POA: Diagnosis not present

## 2021-06-13 DIAGNOSIS — E039 Hypothyroidism, unspecified: Secondary | ICD-10-CM

## 2021-06-13 DIAGNOSIS — I480 Paroxysmal atrial fibrillation: Secondary | ICD-10-CM

## 2021-06-13 DIAGNOSIS — M25552 Pain in left hip: Secondary | ICD-10-CM

## 2021-06-13 MED ORDER — ISOSORBIDE MONONITRATE ER 60 MG PO TB24: 60.0000 mg | ORAL_TABLET | Freq: Every day | ORAL | 1 refills | Status: DC

## 2021-06-13 MED ORDER — APIXABAN 2.5 MG PO TABS: 2.5000 mg | ORAL_TABLET | Freq: Two times a day (BID) | ORAL | 1 refills | Status: DC

## 2021-06-13 MED ORDER — LEVETIRACETAM 100 MG/ML PO SOLN: 500.0000 mg | Freq: Two times a day (BID) | ORAL | 0 refills | Status: DC

## 2021-06-13 MED ORDER — AMLODIPINE BESYLATE 10 MG PO TABS: 10.0000 mg | ORAL_TABLET | Freq: Every day | ORAL | 1 refills | Status: DC

## 2021-06-13 MED ORDER — ATORVASTATIN CALCIUM 40 MG PO TABS: 40.0000 mg | ORAL_TABLET | Freq: Every day | ORAL | 1 refills | Status: DC

## 2021-06-13 MED ORDER — AMIODARONE HCL 200 MG PO TABS: 100.0000 mg | ORAL_TABLET | Freq: Every day | ORAL | 1 refills | Status: DC

## 2021-06-13 NOTE — Patient Instructions (Signed)
Atrial Fibrillation ?Atrial fibrillation is a type of heartbeat that is irregular or fast. If you have this condition, your heart beats without any order. This makes it hard for your heart to pump blood in a normal way. ?Atrial fibrillation may come and go, or it may become a long-lasting problem. If this condition is not treated, it can put you at higher risk for stroke, heart failure, and other heart problems. ?What are the causes? ?This condition may be caused by diseases that damage the heart. They include: ?High blood pressure. ?Heart failure. ?Heart valve disease. ?Heart surgery. ?Other causes include: ?Diabetes. ?Thyroid disease. ?Being overweight. ?Kidney disease. ?Sometimes the cause is not known. ?What increases the risk? ?You are more likely to develop this condition if: ?You are older. ?You smoke. ?You exercise often and very hard. ?You have a family history of this condition. ?You are a man. ?You use drugs. ?You drink a lot of alcohol. ?You have lung conditions, such as emphysema, pneumonia, or COPD. ?You have sleep apnea. ?What are the signs or symptoms? ?Common symptoms of this condition include: ?A feeling that your heart is beating very fast. ?Chest pain or discomfort. ?Feeling short of breath. ?Suddenly feeling light-headed or weak. ?Getting tired easily during activity. ?Fainting. ?Sweating. ?In some cases, there are no symptoms. ?How is this treated? ?Treatment for this condition depends on underlying conditions and how you feel when you have atrial fibrillation. They include: ?Medicines to: ?Prevent blood clots. ?Treat heart rate or heart rhythm problems. ?Using devices, such as a pacemaker, to correct heart rhythm problems. ?Doing surgery to remove the part of the heart that sends bad signals. ?Closing an area where clots can form in the heart (left atrial appendage). ?In some cases, your doctor will treat other underlying conditions. ?Follow these instructions at home: ?Medicines ?Take  over-the-counter and prescription medicines only as told by your doctor. ?Do not take any new medicines without first talking to your doctor. ?If you are taking blood thinners: ?Talk with your doctor before you take any medicines that have aspirin or NSAIDs, such as ibuprofen, in them. ?Take your medicine exactly as told by your doctor. Take it at the same time each day. ?Avoid activities that could hurt or bruise you. Follow instructions about how to prevent falls. ?Wear a bracelet that says you are taking blood thinners. Or, carry a card that lists what medicines you take. ?Lifestyle ?  ?Do not use any products that have nicotine or tobacco in them. These include cigarettes, e-cigarettes, and chewing tobacco. If you need help quitting, ask your doctor. ?Eat heart-healthy foods. Talk with your doctor about the right eating plan for you. ?Exercise regularly as told by your doctor. ?Do not drink alcohol. ?Lose weight if you are overweight. ?Do not use drugs, including cannabis. ?General instructions ?If you have a condition that causes breathing to stop for a short period of time (apnea), treat it as told by your doctor. ?Keep a healthy weight. Do not use diet pills unless your doctor says they are safe for you. Diet pills may make heart problems worse. ?Keep all follow-up visits as told by your doctor. This is important. ?Contact a doctor if: ?You notice a change in the speed, rhythm, or strength of your heartbeat. ?You are taking a blood-thinning medicine and you get more bruising. ?You get tired more easily when you move or exercise. ?You have a sudden change in weight. ?Get help right away if: ? ?You have pain in your chest or  your belly (abdomen). ?You have trouble breathing. ?You have side effects of blood thinners, such as blood in your vomit, poop (stool), or pee (urine), or bleeding that cannot stop. ?You have any signs of a stroke. "BE FAST" is an easy way to remember the main warning signs: ?B - Balance.  Signs are dizziness, sudden trouble walking, or loss of balance. ?E - Eyes. Signs are trouble seeing or a change in how you see. ?F - Face. Signs are sudden weakness or loss of feeling in the face, or the face or eyelid drooping on one side. ?A - Arms. Signs are weakness or loss of feeling in an arm. This happens suddenly and usually on one side of the body. ?S - Speech. Signs are sudden trouble speaking, slurred speech, or trouble understanding what people say. ?T - Time. Time to call emergency services. Write down what time symptoms started. ?You have other signs of a stroke, such as: ?A sudden, very bad headache with no known cause. ?Feeling like you may vomit (nausea). ?Vomiting. ?A seizure. ?These symptoms may be an emergency. Do not wait to see if the symptoms will go away. Get medical help right away. Call your local emergency services (911 in the [place] Do not drive yourself to the hospital. ?Summary ?Atrial fibrillation is a type of heartbeat that is irregular or fast. ?You are at higher risk of this condition if you smoke, are older, have diabetes, or are overweight. ?Follow your doctor's instructions about medicines, diet, exercise, and follow-up visits. ?Get help right away if you have signs or symptoms of a stroke. ?Get help right away if you cannot catch your breath, or you have chest pain or discomfort. ?This information is not intended to replace advice given to you by your health care provider. Make sure you discuss any questions you have with your health care provider. ?Document Revised: 08/18/2018 Document Reviewed: 08/18/2018 ?Elsevier Patient Education ? Rita Yoder. ? ?

## 2021-06-13 NOTE — Progress Notes (Signed)
Rich Brave [place],acting as a Education administrator for [name], MD.,have documented all relevant documentation on the behalf of [name], MD,as directed by  [name], MD while in the presence of [name], MD.  This visit occurred during the SARS-CoV-2 public health emergency.  Safety protocols were in place, including screening questions prior to the visit, additional usage of staff PPE, and extensive cleaning of exam room while observing appropriate contact time as indicated for disinfecting solutions.  Subjective:     Patient ID: Rita Yoder , female    DOB: 10/31/1940 , 81 y.o.   MRN: [id number]   Chief Complaint  Patient presents with   hospital f/u    HPI  Patient presents today with her son for a hospital f/u. Patient was admitted to [facility] on 2/14 for acute stroke. She was transferred to Rehab 05/02/21 and discharged on 05/24/21. She reports she is doing good overall. She reports she is having some pain in her hip and right buttock. She has been doing physical therapy at home. She is happy that she is surrounded by family to help her recuperate.   She presented to ER 04/23/21 with acute onset of right sided weakness. CT brain showed 15m hypodensity left thalamus compatible with infarction of indeterminate age. MRA head/neck - no intracranial large vessel occlusion identified. MRI showed small patchy acute cortical subcortical infarct within the left parietal lobe. There is background moderate advanced chronic small vessel ischemic changes w/in the cerebral white matter. Additionally, microhemorrhages present w/in right parietal occipital lobe. Pt did not receive tPA. Pt not anticoagulated due to concern for microhemorrhages. Hospital course significant for : right hand twitching concerning for focal seizure. Therefore, she was loaded w/ Keppra w/ resolution of her sx. Hospital course also complicated by PAF w/ RVR. She was then started on Eliquis. She was  transferred to rehab on 05/02/21.   Rehab course consisted of PT, ST and OT at least 3 hours five days per week. On admission, she had +2 physical assist sit to stand, moderate assist sit to supine and mod max assist for step pivot transfers.   She was considered to have improvement in activity tolerance, balance, and postural control during rehab. She also had improvement in functional use of RUE/LUE and RLE/LLE. Rehab course complicated by several runs of afib w/ RVR causing dizziness.   She has not had any further episodes of palpitations since discharge.     Past Medical History:  Diagnosis Date   Chronic kidney disease 12/2009   Diabetes mellitus 2010   Dyslipidemia    Hypertension    Monoclonal gammopathy 03/2010     Family History  Problem Relation Age of Onset   Healthy Mother    Heart attack Brother    Cancer Maternal Aunt    Cancer Maternal Uncle    Heart Problems Maternal Grandmother      Current Outpatient Medications:    acetaminophen (TYLENOL) 500 MG tablet, Take 1 tablet (500 mg total) by mouth every 6 (six) hours as needed for headache or mild pain., Disp: 30 tablet, Rfl: 0   Blood Glucose Calibration (OT ULTRA/FASTTK CNTRL SOLN) SOLN, , Disp: , Rfl:    Cholecalciferol (VITAMIN D3) 50 MCG (2000 UT) TABS, Take 2,000 Units by mouth daily., Disp: 30 tablet, Rfl: 0   levothyroxine (SYNTHROID) 25 MCG tablet, Take 1 tablet (25 mcg total) by mouth daily before breakfast., Disp: 30 tablet, Rfl: 0   Menthol-Methyl Salicylate (MUSCLE  RUB) 10-15 % CREA, Apply 1 application. topically 2 (two) times daily., Disp: , Rfl: 0   MODERNA COVID-19 BIVAL BOOSTER 50 MCG/0.5ML injection, , Disp: , Rfl:    nitroGLYCERIN (NITROSTAT) 0.4 MG SL tablet, DISSOLVE 1 TABLET UNDER THE TONGUE AT FIRST SIGN OF ATTACKS, MAY REPEAT EVERY 5 MINUTES UP TO 3 TABLETS, IF NO RELLIEF SEEK MEDICAL HELP (Patient taking differently: Place 0.4 mg under the tongue every 5 (five) minutes x 3 doses as needed for  chest pain (SEEK HELP, IF NO RELIEF).), Disp: 25 tablet, Rfl: 1   polyethylene glycol (MIRALAX / GLYCOLAX) 17 g packet, Take 17 g by mouth 2 (two) times daily., Disp: 14 each, Rfl: 0   senna (SENOKOT) 8.6 MG TABS tablet, Take 1 tablet (8.6 mg total) by mouth daily., Disp: 120 tablet, Rfl: 0   solifenacin (VESICARE) 5 MG tablet, Take 5 mg by mouth daily., Disp: , Rfl:    amiodarone (PACERONE) 200 MG tablet, Take 1/2 tablets (100 mg total) by mouth daily., Disp: 90 tablet, Rfl: 1   amLODipine (NORVASC) 10 MG tablet, Take 1 tablet (10 mg total) by mouth daily., Disp: 90 tablet, Rfl: 1   apixaban (ELIQUIS) 2.5 MG TABS tablet, Take 1 tablet (2.5 mg total) by mouth 2 (two) times daily., Disp: 180 tablet, Rfl: 1   atorvastatin (LIPITOR) 40 MG tablet, Take 1 tablet (40 mg total) by mouth daily., Disp: 90 tablet, Rfl: 1   isosorbide mononitrate (IMDUR) 60 MG 24 hr tablet, Take 1 tablet (60 mg total) by mouth daily., Disp: 90 tablet, Rfl: 1   levETIRAcetam (KEPPRA) 500 MG tablet, Take 1 tablet (500 mg total) by mouth 2 (two) times daily., Disp: 60 tablet, Rfl: 3   losartan (COZAAR) 50 MG tablet, Take 50 mg by mouth daily., Disp: , Rfl:    Allergies  Allergen Reactions   Losartan Other (See Comments)    MD discontinued this because of decreased kidney function     Review of Systems  Constitutional: Negative.   Respiratory: Negative.    Cardiovascular: Negative.   Gastrointestinal: Negative.   Musculoskeletal:  Positive for arthralgias.       She c/o left hip pain. Has been eval by Ortho, needs to lose weight in order to proceed w/ THR.   Neurological:  Positive for weakness.  Psychiatric/Behavioral: Negative.      Today's Vitals   06/13/21 1549  BP: 124/80  Pulse: 73  Temp: 98.3 F (36.8 C)  Weight: 198 lb (89.8 kg)  Height: _0  (1.676 m)  PainSc: 8   PainLoc: Hip   Body mass index is 31.96 kg/m.  Wt Readings from Last 3 Encounters:  07/17/21 204 lb (92.5 kg)  06/13/21 198 lb (89.8  kg)  05/14/21 211 lb 3.2 oz (95.8 kg)     Objective:  Physical Exam Vitals and nursing note reviewed.  Constitutional:      Appearance: Normal appearance. She is obese.  HENT:     Head: Normocephalic and atraumatic.  Eyes:     Extraocular Movements: Extraocular movements intact.  Cardiovascular:     Rate and Rhythm: Normal rate and regular rhythm.     Heart sounds: Normal heart sounds.  Pulmonary:     Effort: Pulmonary effort is normal.     Breath sounds: Normal breath sounds.  Musculoskeletal:     Cervical back: Normal range of motion.  Skin:    General: Skin is warm.  Neurological:     Mental Status: She is  alert.     Motor: Weakness present.     Comments: She responds appropriately.   Psychiatric:        Mood and Affect: Mood normal.        Behavior: Behavior normal.     Assessment And Plan:     1. Subcortical infarction ([place]) TCM PERFORMED. A MEMBER OF THE CLINICAL TEAM SPOKE WITH THE PATIENT UPON DISCHARGE. DISCHARGE SUMMARY WAS REVIEWED IN FULL DETAIL DURING THE VISIT. MEDS RECONCILED AND COMPARED TO DISCHARGE MEDS. MEDICATION LIST WAS UPDATED AND REVIEWED WITH THE PATIENT. GREATER THAN 50% FACE TO FACE TIME WAS SPENT IN COUNSELING AND COORDINATION OF CARE. ALL QUESTIONS WERE ANSWERED TO THE SATISFACTION OF THE PATIENT. She is encouraged to keep BP well controlled and importance of dietary compliance was d/w patient and her son. She will c/w home PT, initiated by Rehab.   2. Cerebrovascular accident (CVA), unspecified mechanism ([place]) Please see #1.   3. Right-sided visual neglect Please see #1.   4. Paroxysmal atrial fibrillation (HCC) Comments: Newly diagnosed. Anticoagulated with Eliquis, rate controlled.  She will be followed by Dr. Terrence Dupont.   5. Focal seizure ([place]) Comments: Now on Keppra, I will check levels today.   6. Left hip pain Comments: She has h/o OA. She has been trying to lose weight so she can have THR. Advised to take Tylenol prn for now.   7.  Parenchymal renal hypertension, stage 1 through stage 4 or unspecified chronic kidney disease Comments: Chronic, well controlled. She is encouraged to follow low sodium diet.   8. Chronic kidney disease, stage IV (severe) (HCC) Comments: Chronic, she is also followed by Renal. Reminded to avoid NSAIDs, keep BP controlled and stay hydrated to decrease risk of CKD progression.   9. Primary hypothyroidism Comments: Last TSH above 5.0 in Feb 2023. She will c/w levothyroxine 62mg daily for now.   10. Atherosclerosis of aorta (HCC) Comments: Chronic, LDL goal <70. Importance of dietary/medication compliance was d/w patient.   11. Class 1 obesity due to excess calories with serious comorbidity and body mass index (BMI) of 31.0 to 31.9 in adult Comments: She is encouraged to perform chair exercises during commercials while watching TV.   12. Drug therapy - Urine Albumin-Creatinine with uACR; Future - CBC no Diff - CMP14+EGFR - Urine Albumin-Creatinine with uACR   Patient was given opportunity to ask questions. Patient verbalized understanding of the plan and was able to repeat key elements of the plan. All questions were answered to their satisfaction.   I, RMaximino Greenland MD, have reviewed all documentation for this visit. The documentation on 08/06/21 for the exam, diagnosis, procedures, and orders are all accurate and complete.   IF YOU HAVE BEEN REFERRED TO A SPECIALIST, IT MAY TAKE 1-2 WEEKS TO SCHEDULE/PROCESS THE REFERRAL. IF YOU HAVE NOT HEARD FROM US/SPECIALIST IN TWO WEEKS, PLEASE GIVE [place] CALL AT [phone].   THE PATIENT IS ENCOURAGED TO PRACTICE SOCIAL DISTANCING DUE TO THE COVID-19 PANDEMIC.

## 2021-06-14 LAB — CMP14+EGFR
ALT: 22 IU/L (ref 0–32)
AST: 24 IU/L (ref 0–40)
Albumin/Globulin Ratio: 1.6 (ref 1.2–2.2)
Albumin: 4.1 g/dL (ref 3.6–4.6)
Alkaline Phosphatase: 100 IU/L (ref 44–121)
BUN/Creatinine Ratio: 12 (ref 12–28)
BUN: 38 mg/dL — ABNORMAL HIGH (ref 8–27)
Bilirubin Total: 0.4 mg/dL (ref 0.0–1.2)
CO2: 17 mmol/L — ABNORMAL LOW (ref 20–29)
Calcium: 9.5 mg/dL (ref 8.7–10.3)
Chloride: 110 mmol/L — ABNORMAL HIGH (ref 96–106)
Creatinine, Ser: 3.06 mg/dL — ABNORMAL HIGH (ref 0.57–1.00)
Globulin, Total: 2.6 g/dL (ref 1.5–4.5)
Glucose: 104 mg/dL — ABNORMAL HIGH (ref 70–99)
Potassium: 4.4 mmol/L (ref 3.5–5.2)
Sodium: 146 mmol/L — ABNORMAL HIGH (ref 134–144)
Total Protein: 6.7 g/dL (ref 6.0–8.5)
eGFR: 15 mL/min/{1.73_m2} — ABNORMAL LOW (ref >59)

## 2021-06-14 LAB — CBC
Hematocrit: 29.3 % — ABNORMAL LOW (ref 34.0–46.6)
Hemoglobin: 9.6 g/dL — ABNORMAL LOW (ref 11.1–15.9)
MCH: 32.1 pg (ref 26.6–33.0)
MCHC: 32.8 g/dL (ref 31.5–35.7)
MCV: 98 fL — ABNORMAL HIGH (ref 79–97)
Platelets: 254 10*3/uL (ref 150–450)
RBC: 2.99 x10E6/uL — ABNORMAL LOW (ref 3.77–5.28)
RDW: 12.2 % (ref 11.7–15.4)
WBC: 11.7 10*3/uL — ABNORMAL HIGH (ref 3.4–10.8)

## 2021-06-15 ENCOUNTER — Other Ambulatory Visit: Payer: Self-pay | Admitting: Internal Medicine

## 2021-06-18 ENCOUNTER — Encounter: Payer: Self-pay | Admitting: Internal Medicine

## 2021-06-18 ENCOUNTER — Other Ambulatory Visit (HOSPITAL_COMMUNITY): Payer: Self-pay

## 2021-06-21 ENCOUNTER — Encounter: Payer: Self-pay | Admitting: Internal Medicine

## 2021-06-21 LAB — MICROALBUMIN / CREATININE URINE RATIO
Creatinine, Urine: 147.1 mg/dL
Microalb/Creat Ratio: 359 mg/g creat — ABNORMAL HIGH (ref 0–29)
Microalbumin, Urine: 528.6 ug/mL

## 2021-07-07 ENCOUNTER — Encounter: Payer: Self-pay | Admitting: Internal Medicine

## 2021-07-15 ENCOUNTER — Ambulatory Visit: Payer: Medicare Other | Admitting: Internal Medicine

## 2021-07-16 ENCOUNTER — Telehealth: Payer: Self-pay

## 2021-07-16 NOTE — Telephone Encounter (Signed)
PT with [facility] called stating the pt is having insidious weight gain since 06/11/21 from 196lbs to 204lbs. They have been educating  her for daily weigh ins, CHF management and as well as diet. She just wanted to make [place] aware of the weight gain. The family reported that the pt is not eatin healthy. [phone] ? ? ?I called pt son to see if the pt is having any leg swelling and he reported that she is just a little bit. I advised him that pt needs to elevate her legs and decrease her salt intake. YL,RMA ?

## 2021-07-17 ENCOUNTER — Encounter: Payer: Self-pay | Admitting: Physical Medicine & Rehabilitation

## 2021-07-17 ENCOUNTER — Encounter: Payer: Medicare Other | Attending: Registered Nurse | Admitting: Physical Medicine & Rehabilitation

## 2021-07-17 VITALS — BP 155/75 | HR 62 | Ht 66.0 in | Wt 204.0 lb

## 2021-07-17 DIAGNOSIS — I10 Essential (primary) hypertension: Secondary | ICD-10-CM | POA: Insufficient documentation

## 2021-07-17 DIAGNOSIS — I639 Cerebral infarction, unspecified: Secondary | ICD-10-CM | POA: Diagnosis present

## 2021-07-17 DIAGNOSIS — M25552 Pain in left hip: Secondary | ICD-10-CM | POA: Diagnosis present

## 2021-07-17 MED ORDER — LEVETIRACETAM 500 MG PO TABS: 500.0000 mg | ORAL_TABLET | Freq: Two times a day (BID) | ORAL | 3 refills | Status: DC

## 2021-07-17 NOTE — Patient Instructions (Addendum)
KNEE HIGH TED STOCKINGS/ COMPRESSION STOCKINGS TO HELP WITH SWELLING---MAKE SURE THEY DON'T ROLL.  ?

## 2021-07-17 NOTE — Progress Notes (Signed)
? ?Subjective:  ? ? Patient ID: Rita Yoder, female    DOB: 10/28/1940, 81 y.o.   MRN: [id number] ? ?HPI ? ?Rita Yoder is here in follow up of her left brain infarcts. She still has some word finding deficits but is experiencing improvement. Home health PT, OT, SLP are still coming out to the house but are winding.  ? ?She has some left hip pain which is chronic. She has a small cyst that she notices on her lower left leg which non-painful. Swelling has been an issue with her left foot and ankle at times.  ? ?Appetite has been better. Bowels and bladder are moving. Sleep is excellent ?Mood seems up beat. Family are very supportive.  ? ? ?Pain Inventory ?Average Pain 5 ?Pain Right Now 5 ?My pain is constant and throbs ? ?LOCATION OF PAIN  left hip ? ?BOWEL ?Number of stools per week: 3 ? ? ?BLADDER ?Pads ? ? ?Mobility ?walk with assistance ?use a walker ?how many minutes can you walk? 5 minutes ?ability to climb steps?  yes ?do you drive?  yes ? ?Function ?retired ?I need assistance with the following:  dressing, bathing, and household duties ?Do you have any goals in this area?  yes ? ?Neuro/Psych ?weakness ?numbness ?tingling ?trouble walking ? ?Prior Studies ?Any changes since last visit?  no ? ?Physicians involved in your care ?Any changes since last visit?  no ? ? ?Family History  ?Problem Relation Age of Onset  ? Healthy Mother   ? Heart attack Brother   ? Cancer Maternal Aunt   ? Cancer Maternal Uncle   ? Heart Problems Maternal Grandmother   ? ?Social History  ? ?Socioeconomic History  ? Marital status: Married  ?  Spouse name: Not on file  ? Number of children: 4  ? Years of education: Not on file  ? Highest education level: Not on file  ?Occupational History  ? Occupation: retrired  ?Tobacco Use  ? Smoking status: Never  ? Smokeless tobacco: Never  ?Vaping Use  ? Vaping Use: Never used  ?Substance and Sexual Activity  ? Alcohol use: No  ? Drug use: Never  ? Sexual activity: Not Currently  ?Other  Topics Concern  ? Not on file  ?Social History Narrative  ? Not on file  ? ?Social Determinants of Health  ? ?Financial Resource Strain: Low Risk   ? Difficulty of Paying Living Expenses: Not hard at all  ?Food Insecurity: No Food Insecurity  ? Worried About Charity fundraiser in the Last Year: Never true  ? Ran Out of Food in the Last Year: Never true  ?Transportation Needs: No Transportation Needs  ? Lack of Transportation (Medical): No  ? Lack of Transportation (Non-Medical): No  ?Physical Activity: Inactive  ? Days of Exercise per Week: 0 days  ? Minutes of Exercise per Session: 0 min  ?Stress: No Stress Concern Present  ? Feeling of Stress : Not at all  ?Social Connections: Not on file  ? ?Past Surgical History:  ?Procedure Laterality Date  ? CATARACT EXTRACTION Bilateral 12/2020  ? CHOLECYSTECTOMY    ? HERNIA REPAIR    ? ROTATOR CUFF REPAIR  03/10/1992  ? THYROID SURGERY    ? TONSILLECTOMY    ? TOTAL ABDOMINAL HYSTERECTOMY    ? ?Past Medical History:  ?Diagnosis Date  ? Chronic kidney disease 12/2009  ? Diabetes mellitus 2010  ? Dyslipidemia   ? Hypertension   ? Monoclonal gammopathy 03/2010  ? ?  BP (!) 156/63   Pulse 82   Ht '5\' 6"'$  (1.676 m)   Wt 204 lb (92.5 kg)   BMI 32.93 kg/m?  ? ?Opioid Risk Score:   ?Fall Risk Score:  `1 ? ?Depression screen PHQ 2/9 ? ? ?  07/17/2021  ? 10:40 AM 06/05/2021  ?  9:12 AM 12/20/2020  ? 12:19 PM 11/30/2019  ?  2:30 PM 11/30/2019  ?  2:07 PM 11/18/2018  ?  2:26 PM 01/20/2018  ? 11:14 AM  ?Depression screen PHQ 2/9  ?Decreased Interest 0 0 0 0 0 0 0  ?Down, Depressed, Hopeless 0 0 0 0 0 0 0  ?PHQ - 2 Score 0 0 0 0 0 0 0  ?Altered sleeping  0    0   ?Tired, decreased energy  2    0   ?Change in appetite  2    0   ?Feeling bad or failure about yourself   0    0   ?Trouble concentrating      0   ?Moving slowly or fidgety/restless  0    0   ?Suicidal thoughts  0    0   ?PHQ-9 Score  4    0   ?Difficult doing work/chores      Not difficult at all   ? ? ?Review of Systems   ?Cardiovascular:  Positive for leg swelling.  ?Musculoskeletal:  Positive for gait problem.  ?     Left hip pain  ?All other systems reviewed and are negative. ? ?   ?Objective:  ? Physical Exam ? ? ?General: Alert and oriented x 3, No apparent distress.  Obese ?HEENT: Head is normocephalic, atraumatic, PERRLA, EOMI, sclera anicteric, oral mucosa pink and moist, dentition intact, ext ear canals clear,  ?Neck: Supple without JVD or lymphadenopathy ?Heart: Reg rate and rhythm. No murmurs rubs or gallops ?Chest: CTA bilaterally without wheezes, rales, or rhonchi; no distress ?Abdomen: Soft, non-tender, non-distended, bowel sounds positive. ?Extremities: No clubbing, cyanosis, or edema. Pulses are 2+ ?Psych: Pt's affect is appropriate. Pt is cooperative ?Skin: Clean and intact without signs of breakdown ?Neuro:  ORIENTED to person, place.  Has ongoing word finding deficits but demonstrates much improvement.  With extra time and concentration is able to convey her thoughts and ideas.  She follows directions sometimes with extra time and physical/verbal cueing.  Right upper extremity is 3+ to 4 out of 5 proximal distal.  Right lower extremity is 4 out of 5.  Left upper and lower 4+ to 5 out of 5.  Decreased sensation distally.  No focal resting tone.  Was able to take a few steps with hand-held assistance today.  Tends to shuffle feet a bit but generally uses good safety precautions.  Posture is fair. ?Musculoskeletal: Patient with ankle and pedal edema in bilateral lower extremities left more than right.  There is a small area of scar tissue/old cyst along the medial right calf.  Area is nontender. ? ? ?   ?Assessment & Plan:  ? ?1. Functional deficits secondary to acute multifocal infarct including left ischemic thalamus, left parietal cortical and subcortical infarcts ? -She has made nice gains so far. ?     continue with [facility] for now. ?-referral made to [facility] rehab pt, ot, slp ?2.  Antithrombotics: ?-)  Eliquis ?            - ?3. Pain/left shoulder girdle pain:  ?             -  Pain appears improved ?4. Mood: Family appears quite supportive.  Mood is upbeat ?5. ? Seizure: Continue on Keppra.  Changed over to pill form today.  Needs follow-up with neurology to determine long-term need. ?6. Afib, htn: cardiology follows her ?7.  Nutrition: Appetite seems to be improving.  Discussed importance of nutrition regarding her recovery and overall health. ? ?30 minutes of face to face patient care time were spent during this visit. All questions were encouraged and answered.  Follow up with me in 3 mos .  ?

## 2021-07-23 ENCOUNTER — Inpatient Hospital Stay: Payer: Medicare Other | Admitting: Neurology

## 2021-07-29 ENCOUNTER — Encounter: Payer: Self-pay | Admitting: Internal Medicine

## 2021-08-05 ENCOUNTER — Other Ambulatory Visit: Payer: Self-pay | Admitting: Internal Medicine

## 2021-08-07 ENCOUNTER — Telehealth: Payer: Self-pay

## 2021-08-07 NOTE — Telephone Encounter (Signed)
I called and left a vm letting pt's son aware that his forms have been completed and faxed. I have placed a copy up front for him to pick up. YL,RMA

## 2021-08-08 ENCOUNTER — Encounter: Payer: Self-pay | Admitting: Internal Medicine

## 2021-08-12 ENCOUNTER — Ambulatory Visit: Payer: Medicare Other | Attending: Physical Medicine & Rehabilitation | Admitting: Occupational Therapy

## 2021-08-12 ENCOUNTER — Ambulatory Visit: Payer: Medicare Other

## 2021-08-12 DIAGNOSIS — R482 Apraxia: Secondary | ICD-10-CM

## 2021-08-12 DIAGNOSIS — R41841 Cognitive communication deficit: Secondary | ICD-10-CM | POA: Insufficient documentation

## 2021-08-12 DIAGNOSIS — I69351 Hemiplegia and hemiparesis following cerebral infarction affecting right dominant side: Secondary | ICD-10-CM | POA: Insufficient documentation

## 2021-08-12 DIAGNOSIS — R4701 Aphasia: Secondary | ICD-10-CM | POA: Diagnosis present

## 2021-08-12 DIAGNOSIS — R2689 Other abnormalities of gait and mobility: Secondary | ICD-10-CM | POA: Diagnosis present

## 2021-08-12 DIAGNOSIS — R278 Other lack of coordination: Secondary | ICD-10-CM | POA: Diagnosis present

## 2021-08-12 DIAGNOSIS — R41840 Attention and concentration deficit: Secondary | ICD-10-CM | POA: Diagnosis present

## 2021-08-12 DIAGNOSIS — M25511 Pain in right shoulder: Secondary | ICD-10-CM | POA: Insufficient documentation

## 2021-08-12 DIAGNOSIS — R41842 Visuospatial deficit: Secondary | ICD-10-CM | POA: Diagnosis present

## 2021-08-12 DIAGNOSIS — R2681 Unsteadiness on feet: Secondary | ICD-10-CM | POA: Diagnosis present

## 2021-08-12 DIAGNOSIS — M6281 Muscle weakness (generalized): Secondary | ICD-10-CM | POA: Diagnosis present

## 2021-08-12 NOTE — Therapy (Unsigned)
OUTPATIENT SPEECH LANGUAGE PATHOLOGY EVALUATION   Patient Name: Rita Yoder MRN: [id number] DOB:07-19-1940, 81 y.o., female Today's Date: 08/13/2021  PCP: Cyndee Brightly MD REFERRING PROVIDER: Meredith Staggers, MD    End of Session - 08/12/21 1244     Visit Number 1    Number of Visits 25    Date for SLP Re-Evaluation 11/08/21    Authorization Type [employer] Medicare    SLP Start Time 1016    SLP Stop Time  1102    SLP Time Calculation (min) 46 min    Activity Tolerance Patient tolerated treatment well             Past Medical History:  Diagnosis Date   Chronic kidney disease 12/2009   Diabetes mellitus 2010   Dyslipidemia    Hypertension    Monoclonal gammopathy 03/2010   Past Surgical History:  Procedure Laterality Date   CATARACT EXTRACTION Bilateral 12/2020   CHOLECYSTECTOMY     HERNIA REPAIR     ROTATOR CUFF REPAIR  03/10/1992   THYROID SURGERY     TONSILLECTOMY     TOTAL ABDOMINAL HYSTERECTOMY     Patient Active Problem List   Diagnosis Date Noted   AKI (acute kidney injury) ([place])    Right-sided visual neglect    Hyperglycemia    Subcortical infarction ([place]) 05/02/2021   Stroke ([place]) 04/24/2021   Stroke (cerebrum) ([place]) 04/23/2021   Chronic kidney disease, stage IV (severe) ([place]) 08/26/2020   Weight loss 08/26/2020   Abnormal thyroid blood test 08/26/2020   Left hip pain 08/26/2020   Atherosclerosis of aorta ([place]) 08/20/2020   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   Ecchymoses, spontaneous 06/14/2019   Class 3 severe obesity due to excess calories with serious comorbidity and body mass index (BMI) of 45.0 to 49.9 in adult [facility]) 06/14/2019   Diabetic polyneuropathy associated with type 2 diabetes mellitus ([place]) 01/06/2019   BMI 40.0-44.9, adult ([place]) 01/06/2019   Essential hypertension 11/25/2018   Mixed hyperlipidemia 11/25/2018   Precordial pain 11/25/2018   CKD (chronic kidney disease), symptom management only, stage 3 (moderate) ([place])  04/20/2013    ONSET DATE:  05/02/2021    REFERRING DIAG: I63.9 (ICD-10-CM) - Subcortical infarction   THERAPY DIAG:  Aphasia  Verbal apraxia  Cognitive communication deficit  Rationale for Evaluation and Treatment Rehabilitation  SUBJECTIVE:   SUBJECTIVE STATEMENT: "Sometimes I can do good. Sometimes I don't" re: speech Pt accompanied by: self and family member (son Rita Yoder intermittently present per his choice)  PERTINENT HISTORY: "Patient is an 81 year old right-handed female with history of nonobstructive CAD, hypertension, hypothyroidism, type II diabetes mellitus, hyperlipidemia, normocytic anemia/monoclonal gammopathy followed by Dr. Irene Limbo, CKD stage III.   Presented 04/23/2021 with acute onset of right side weakness and word finding difficulties.  Cranial CT scan showed 8 mm hypodensity left thalamus compatible with infarct of indeterminate age.  No acute hemorrhage.  MRA head and neck no intracranial large vessel occlusion identified.  Multiple sites of moderate/severe stenosis within the P2 left PCA.  Severe stenosis within the mid to distal M1 left MCA.  MRI follow-up small patchy acute cortical subcortical infarct within the left parietal lobe.  Background moderate to moderately advanced chronic small vessel ischemic changes within the cerebral white matter additionally, clustered chronic parenchymal microhemorrhages present within the right parieto-occipital lobes."    PAIN:  Are you having pain? No   FALLS: Has patient fallen in last 6 months?  No  LIVING ENVIRONMENT: Lives with: lives with their  family Lives in: House/apartment  PLOF:  Level of assistance: Independent with ADLs, Independent with IADLs per patient report Employment: Retired   PATIENT GOALS "to get where I need to get. To work on keeping the speech at that level"  OBJECTIVE:   COGNITION: Overall cognitive status: Impaired: Attention: Impaired: Alternating, Divided, Memory: Impaired: Working Industrial/product designer  term, Awareness: Impaired: Emergent, Anticipatory, and Neglect, Executive function: Impaired: Problem solving, Error awareness, and Slow processing, Behavior: Within functional limits, and Functional deficits: pt endorses difficulty recalling personally relevant information, such as her birthday  COGNITIVE COMMUNICATION Following directions: Follows multi-step commands inconsistently  Auditory comprehension: WFL Verbal expression: Impaired: slow, effortful with intermittent anomia  AUDITORY COMPREHENSION: Overall auditory comprehension: Impaired: moderately complex YES/NO questions: Not tested Following directions: Impaired: moderately complex Conversation: Simple Interfering components: motor planning, processing speed, and working memory Effective technique: extra processing time, pausing, repetition/stressing words, and slowed speech  EXPRESSION: verbal  VERBAL EXPRESSION: Level of generative/spontaneous verbalization: conversation Naming: Responsive: 51-75%, Confrontation: 76-100%, and Divergent: 0-25% Pragmatics: Appears intact Effective technique: semantic cues, phonemic cues, and written cues Non-verbal means of communication: gestures Comments: Pt presents with slow, effortful speech with intermittent halting/pausing seemingly related to apraxia and anomia  WRITTEN EXPRESSION: Dominant hand: right  Written expression: Not tested   ORAL MOTOR EXAMINATION Facial : WFL Lingual: Mild incoordination and reduced ROM Velum: WFL Mandible: WFL Cough: WFL Voice: WFL  STANDARDIZED ASSESSMENTS: CLQT: Memory: Moderate and Language: Moderate and [place] NAMING TEST: 6/15 independently   PATIENT REPORTED OUTCOME MEASURES (PROM): Not completed due to time constraints   TODAY'S TREATMENT:  08-12-21: Educated patient on evaluation results and clinical observations. Recommended speech therapy intervention to address aphasia, apraxia, and cognitive linguistic deficits. Pt and her son  verbalized understanding and agreement.    PATIENT EDUCATION: Education details: see above Person educated: Patient and Child(ren) Education method: Customer service manager Education comprehension: verbalized understanding, returned demonstration, and needs further education     GOALS: Goals reviewed with patient? Yes  SHORT TERM GOALS: Target date: 09/13/2021   Pt will verbalize 5-10 items in personally relevant categories with occasional mod A over 2 sessions  Baseline: Goal status: INITIAL  2.  Pt will utilize anomia compensations and multimodal communication to augment verbal expression as needed on structured tasks with occasional mod A over 2 sessions Baseline:  Goal status: INITIAL  3.  Pt will ID and attempt to correct apraxic/articulation errors on structured tasks given occasional mod A over 2 sessions Baseline:  Goal status: INITIAL  4.  Pt will demonstrate ability to recall personally relevant information with use of external aids as needed given occasional mod A over 2 sessions Baseline:  Goal status: INITIAL   LONG TERM GOALS: Target date: 11/08/2021    Pt will utilize anomia compensations and multimodal communication to augment verbal expression in simple conversation with occasional min A over 2 sessions Baseline:  Goal status: INITIAL  2.  Pt will ID and attempt to correct apraxic/articulation errors in simple conversation given occasional mod A over 2 sessions Baseline:  Goal status: INITIAL  3.  Pt will demonstrate ability to recall personally relevant information with use of external aids as needed given occasional min A over 2 sessions Baseline:  Goal status: INITIAL   ASSESSMENT:  CLINICAL IMPRESSION: Patient is a 81 y.o. female who was seen today for cognitive communication changes post stroke in February 2023. Recently completed CIR and HH ST but pt unable to detail targeted ST intervention. Endorsed some ongoing  difficulty with word  retrieval and changes in memory, which is an "adjustment and aggravating." Various testing today revealed moderate memory and moderate language deficits, which appeared related to apraxia and aphasia. Pt intermittently used gesture x1 during naming assessment. Reading accuracy was sporadic with only few key words identified in opening sentence of passage. Visual changes endorsed (OT reported some right neglect). Overall adequate reading at word level noted during BNT Short Form. Pt's son reported overall significant improvements in speech and cognition since hospitalization; however, pt has not yet returned to baseline. Skilled ST intervention is warranted to address verbal apraxia, aphasia, and cognitive linguistic changes to maximize return to baseline and optimize QOL.  OBJECTIVE IMPAIRMENTS include attention, memory, executive functioning, aphasia, and apraxia. These impairments are limiting patient from household responsibilities, ADLs/IADLs, and effectively communicating at home and in community. Factors affecting potential to achieve goals and functional outcome are ability to learn/carryover information. Patient will benefit from skilled SLP services to address above impairments and improve overall function.  REHAB POTENTIAL: Good  PLAN: SLP FREQUENCY: 2x/week (pt may elect for 1x/week due to transportation)  SLP DURATION: 12 weeks  PLANNED INTERVENTIONS: Language facilitation, Environmental controls, Trials of upgraded texture/liquids, Cueing hierachy, Cognitive reorganization, Internal/external aids, Functional tasks, Multimodal communication approach, SLP instruction and feedback, Compensatory strategies, and Patient/family education    Marzetta Board, [place] 08/13/2021, 8:24 AM

## 2021-08-12 NOTE — Therapy (Signed)
OUTPATIENT OCCUPATIONAL THERAPY NEURO EVALUATION  Patient Name: Rita Yoder MRN: [id number] DOB:February 23, 1941, 81 y.o., female Today's Date: 08/12/2021  PCP: Glendale Chard, MD REFERRING PROVIDER: Meredith Staggers, MD    OT End of Session - 08/12/21 1421     Visit Number 1    Number of Visits 24    Date for OT Re-Evaluation 11/09/21    Authorization Type UHC MCR    Progress Note Due on Visit 10    OT Start Time 1100    OT Stop Time 9379    OT Time Calculation (min) 45 min    Activity Tolerance Patient tolerated treatment well    Behavior During Therapy [facility] for tasks assessed/performed             Past Medical History:  Diagnosis Date   Chronic kidney disease 12/2009   Diabetes mellitus 2010   Dyslipidemia    Hypertension    Monoclonal gammopathy 03/2010   Past Surgical History:  Procedure Laterality Date   CATARACT EXTRACTION Bilateral 12/2020   CHOLECYSTECTOMY     HERNIA REPAIR     ROTATOR CUFF REPAIR  03/10/1992   THYROID SURGERY     TONSILLECTOMY     TOTAL ABDOMINAL HYSTERECTOMY     Patient Active Problem List   Diagnosis Date Noted   AKI (acute kidney injury) ([place])    Right-sided visual neglect    Hyperglycemia    Subcortical infarction ([place]) 05/02/2021   Stroke ([place]) 04/24/2021   Stroke (cerebrum) ([place]) 04/23/2021   Chronic kidney disease, stage IV (severe) ([place]) 08/26/2020   Weight loss 08/26/2020   Abnormal thyroid blood test 08/26/2020   Left hip pain 08/26/2020   Atherosclerosis of aorta ([place]) 08/20/2020   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   Ecchymoses, spontaneous 06/14/2019   Class 3 severe obesity due to excess calories with serious comorbidity and body mass index (BMI) of 45.0 to 49.9 in adult [facility]) 06/14/2019   Diabetic polyneuropathy associated with type 2 diabetes mellitus ([place]) 01/06/2019   BMI 40.0-44.9, adult ([place]) 01/06/2019   Essential hypertension 11/25/2018   Mixed hyperlipidemia 11/25/2018   Precordial pain  11/25/2018   CKD (chronic kidney disease), symptom management only, stage 3 (moderate) ([place]) 04/20/2013    ONSET DATE: 04/23/21  REFERRING DIAG: I63.9 (ICD-10-CM) - Subcortical infarction ([place])   THERAPY DIAG:  Hemiplegia and hemiparesis following cerebral infarction affecting right dominant side ([place]) - Plan: Ot plan of care cert/re-cert  Other lack of coordination - Plan: Ot plan of care cert/re-cert  Acute pain of right shoulder - Plan: Ot plan of care cert/re-cert  Visuospatial deficit - Plan: Ot plan of care cert/re-cert  Attention and concentration deficit - Plan: Ot plan of care cert/re-cert  Unsteadiness on feet - Plan: Ot plan of care cert/re-cert  Rationale for Evaluation and Treatment Rehabilitation  SUBJECTIVE:   SUBJECTIVE STATEMENT: No pain today Pt accompanied by:  son Izell [place])  PERTINENT HISTORY: 81 y/o female presenting on 2/14 with R sided numbness. MRI with small patchy acute cortical/subcortical infarcts in L parietal lobe. Noted worsening dysarthria and twitching of R hand 2/15- plan for EEG.  PMH includes: CKD, HTN, OA bilateral hips  PRECAUTIONS: Fall, Rt inattention, aphasia  WEIGHT BEARING RESTRICTIONS No  PAIN:  Are you having pain? No  FALLS: Has patient fallen in last 6 months? No  LIVING ENVIRONMENT: Lives with: lives with their family (oldest son lives w/ patient) Lives in: House/apartment Stairs: Yes: External: 2 steps; none Has following equipment at home:  Quad cane small base, Shower bench, bed side commode, and grab bars and hand held shower  PLOF: Independent (except driving)  PATIENT GOALS stronger in Rt hand and arm  OBJECTIVE:   HAND DOMINANCE: Right  ADLs: Requires 24 hour supervision at this time Eating: difficulty w/ Rt hand, dependent for cutting food Grooming: slower and more difficult w/ Rt hand   UB Dressing: mod assist  LB Dressing: mod assist Toileting: incontinence, wears diapers, but still uses bathroom and  performs hygiene and clothes management Bathing: mod assist Tub Shower transfers: close supervision   IADLs: Shopping: dependent (daughter does) Light housekeeping: dependent (family does) Meal Prep: Family (mostly daughter and granddaughter) does. Pt goes in to get snack or drink w/ rollator  Community mobility: relies on family (has not driven in a few years) Medication management: daughter is doing Doctor, hospital: son does Comptroller) Handwriting:  75% first name only, partly d/t aphasia as well as coordination  MOBILITY STATUS: Needs Assist: rollator, supervision  POSTURE COMMENTS:  rounded shoulders and flexed trunk    UPPER EXTREMITY ROM   LUE AROM WFL's, RUE scaption to approx 60*, limited ER, IR 75%, full movement elbow distally w/ decreased coordination and slight dystonia     HAND FUNCTION: Grip strength: Right: 20.2 lbs; Left: 29.9 lbs  COORDINATION: 9 Hole Peg test: Right: 99.37 sec; Left: 51 sec (slow on Lt as well d/t difficulty following directions and decreased attention to Rt side) Box and Blocks:  Right 14 blocks  SENSATION: Difficult to assess/get accurate reading due to aphasia  EDEMA: mild Rt hand  MUSCLE TONE: RUE: Hypotonic  COGNITION: Overall cognitive status: Impaired: difficult to assess d/t aphasia however pt also had a hard time following 1 step demonstration  commands  VISION: Baseline vision:  not tested - pt had to leave early to use bathroom Visual assessment:  not formally assessed however noted Rt inattention and cues needed to look Rt    PERCEPTION: Impaired: Inattention/neglect: does not attend to right visual field or Rt side of body  PRAXIS: impaired  OBSERVATIONS: Rt inattention and learned non use of Rt dominant hand, aphasia   TODAY'S TREATMENT:  N/A today    HOME EXERCISE PROGRAM: Not yet addressed    GOALS: Goals reviewed with patient? No (d/t time constraints)  SHORT TERM GOALS: Target date:  09/11/21  Pt/family to verbalize understanding with visual scanning strategies and ways to encourage scanning to Rt side and using Rt hand Baseline: Goal status: INITIAL  2.  Pt to be min assist w/ dressing using techniques and A/E prn Baseline: mod assist Goal status: INITIAL  3.  Pt to be min assist w/ bathing using A/E prn Baseline:  Goal status: INITIAL  4.  Pt to write first name with 90% legibility consistently  Baseline: 75-90% Goal status: INITIAL  5.  Pt to improve RUE function as evidenced by performing 18 or greater on Box & blocks Baseline: 14 Goal status: INITIAL  6.  Pt to improve grip strength Rt hand by 5 lbs or greater Baseline: 20 lbs Goal status: INITIAL  LONG TERM GOALS: Target date: 11/09/21  Independent w/ HEP targeting Rt shoulder ROM and Rt hand coordination  Baseline:  Goal status: INITIAL  2.  Pt to improve coordination Rt hand as evidenced by performing 9 hole peg test in 60 sec or under Baseline: 99 sec Goal status: INITIAL  3.  Pt to write full name at 90% or greater legibility Baseline:  Goal status:  INITIAL  4.  Pt to perform BADLS at supervision level only Baseline: mod assist Goal status: INITIAL  5.  Pt to make simple cold meal (sandwich) and consistently get snacks w/ supervision Baseline:  Goal status: INITIAL  6.  Pt to perform 45* Rt sh flexion to retrieve light weight objects from mid level shelf Baseline: approx 60* scaption Goal status: INITIAL  ASSESSMENT:  CLINICAL IMPRESSION: Patient is a 81 y.o. female who was seen today for occupational therapy evaluation following Lt subcortical stroke on 04/23/21. Pt received inpatient rehab and home health therapy prior to outpatient. Pt presents today with Rt inattention, decreased RUE ROM, coordination, and strength; decreased cognition and aphasia, decreased mobility, and apraxia. Pt would benefit from skilled O.T. to address these deficits, increase safety and independence with  ADLS, and decrease caregiver burden of care.   PERFORMANCE DEFICITS in functional skills including ADLs, IADLs, coordination, dexterity, proprioception, sensation, tone, ROM, strength, pain, FMC, mobility, balance, body mechanics, endurance, decreased knowledge of precautions, decreased knowledge of use of DME, and UE functional use, cognitive skills including attention, memory, perception, problem solving, and safety awareness.   IMPAIRMENTS are limiting patient from ADLs, IADLs, leisure, and social participation.   COMORBIDITIES may have co-morbidities  that affects occupational performance. Patient will benefit from skilled OT to address above impairments and improve overall function.  MODIFICATION OR ASSISTANCE TO COMPLETE EVALUATION: Min-Moderate modification of tasks or assist with assess necessary to complete an evaluation.  OT OCCUPATIONAL PROFILE AND HISTORY: Problem focused assessment: Including review of records relating to presenting problem.  CLINICAL DECISION MAKING: LOW - limited treatment options, no task modification necessary  REHAB POTENTIAL: Fair (multiple deficit areas and age)  EVALUATION COMPLEXITY: Low    PLAN: OT FREQUENCY: 1-2x/week (Family can only bring her 1x/wk most weeks, but may be able to bring her 2x/wk some weeks)   OT DURATION: 12 weeks  PLANNED INTERVENTIONS: self care/ADL training, therapeutic exercise, therapeutic activity, neuromuscular re-education, manual therapy, passive range of motion, functional mobility training, moist heat, patient/family education, cognitive remediation/compensation, visual/perceptual remediation/compensation, energy conservation, coping strategies training, DME and/or AE instructions, and Re-evaluation  RECOMMENDED OTHER SERVICES: none  CONSULTED AND AGREED WITH PLAN OF CARE: Patient and family member/caregiver  PLAN FOR NEXT SESSION: address Rt visual scanning, simple coordination HEP Rt hand, table slides and shoulder  ROM   Hans Eden, OT 08/12/2021, 2:22 PM

## 2021-08-15 ENCOUNTER — Other Ambulatory Visit (INDEPENDENT_AMBULATORY_CARE_PROVIDER_SITE_OTHER): Payer: Medicare Other | Admitting: Internal Medicine

## 2021-08-15 ENCOUNTER — Encounter: Payer: Self-pay | Admitting: Physical Therapy

## 2021-08-15 ENCOUNTER — Ambulatory Visit: Payer: Medicare Other | Admitting: Physical Therapy

## 2021-08-15 VITALS — BP 132/86

## 2021-08-15 DIAGNOSIS — I5032 Chronic diastolic (congestive) heart failure: Secondary | ICD-10-CM

## 2021-08-15 DIAGNOSIS — I69351 Hemiplegia and hemiparesis following cerebral infarction affecting right dominant side: Secondary | ICD-10-CM

## 2021-08-15 DIAGNOSIS — R2681 Unsteadiness on feet: Secondary | ICD-10-CM

## 2021-08-15 DIAGNOSIS — Z9181 History of falling: Secondary | ICD-10-CM

## 2021-08-15 DIAGNOSIS — I69398 Other sequelae of cerebral infarction: Secondary | ICD-10-CM

## 2021-08-15 DIAGNOSIS — I447 Left bundle-branch block, unspecified: Secondary | ICD-10-CM

## 2021-08-15 DIAGNOSIS — I130 Hypertensive heart and chronic kidney disease with heart failure and stage 1 through stage 4 chronic kidney disease, or unspecified chronic kidney disease: Secondary | ICD-10-CM

## 2021-08-15 DIAGNOSIS — M6281 Muscle weakness (generalized): Secondary | ICD-10-CM

## 2021-08-15 DIAGNOSIS — N1832 Chronic kidney disease, stage 3b: Secondary | ICD-10-CM

## 2021-08-15 DIAGNOSIS — I69314 Frontal lobe and executive function deficit following cerebral infarction: Secondary | ICD-10-CM

## 2021-08-15 DIAGNOSIS — I69320 Aphasia following cerebral infarction: Secondary | ICD-10-CM

## 2021-08-15 DIAGNOSIS — R2689 Other abnormalities of gait and mobility: Secondary | ICD-10-CM

## 2021-08-15 NOTE — Therapy (Unsigned)
OUTPATIENT PHYSICAL THERAPY NEURO EVALUATION   Patient Name: Rita Yoder MRN: [id number] DOB:1940/05/01, 81 y.o., female Today's Date: 08/16/2021   PCP: Glendale Chard, MD REFERRING PROVIDER: Meredith Staggers, MD    PT End of Session - 08/15/21 1412     Visit Number 1    Number of Visits 9   8+eval   Date for PT Re-Evaluation 10/25/21   pushed out for scheduling   Authorization Type UHC MEDICARE    Progress Note Due on Visit 10    PT Start Time 1400    PT Stop Time 1448    PT Time Calculation (min) 48 min    Equipment Utilized During Treatment Gait belt    Activity Tolerance Patient tolerated treatment well    Behavior During Therapy WFL for tasks assessed/performed             Past Medical History:  Diagnosis Date   Chronic kidney disease 12/2009   Diabetes mellitus 2010   Dyslipidemia    Hypertension    Monoclonal gammopathy 03/2010   Past Surgical History:  Procedure Laterality Date   CATARACT EXTRACTION Bilateral 12/2020   CHOLECYSTECTOMY     HERNIA REPAIR     ROTATOR CUFF REPAIR  03/10/1992   THYROID SURGERY     TONSILLECTOMY     TOTAL ABDOMINAL HYSTERECTOMY     Patient Active Problem List   Diagnosis Date Noted   AKI (acute kidney injury) ([place])    Right-sided visual neglect    Hyperglycemia    Subcortical infarction ([place]) 05/02/2021   Stroke ([place]) 04/24/2021   Stroke (cerebrum) ([place]) 04/23/2021   Chronic kidney disease, stage IV (severe) ([place]) 08/26/2020   Weight loss 08/26/2020   Abnormal thyroid blood test 08/26/2020   Left hip pain 08/26/2020   Atherosclerosis of aorta ([place]) 08/20/2020   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   Ecchymoses, spontaneous 06/14/2019   Class 3 severe obesity due to excess calories with serious comorbidity and body mass index (BMI) of 45.0 to 49.9 in adult [facility]) 06/14/2019   Diabetic polyneuropathy associated with type 2 diabetes mellitus ([place]) 01/06/2019   BMI 40.0-44.9, adult ([place]) 01/06/2019    Essential hypertension 11/25/2018   Mixed hyperlipidemia 11/25/2018   Precordial pain 11/25/2018   CKD (chronic kidney disease), symptom management only, stage 3 (moderate) ([place]) 04/20/2013    ONSET DATE: 04/23/2021 (CVA)  REFERRING DIAG: I63.9 (ICD-10-CM) - Subcortical infarction ([place])   THERAPY DIAG:  Other abnormalities of gait and mobility  Muscle weakness (generalized)  Unsteadiness on feet  Rationale for Evaluation and Treatment Rehabilitation  SUBJECTIVE:  SUBJECTIVE STATEMENT: CVA on 04/23/2021 where pt was admitted to [facility]. She was transferred to Rehab 05/02/21 and discharged on 05/24/21.  She completed home health PT in May. Pt accompanied by: family member-[name] (daughter)  PERTINENT HISTORY: HTN, hyperlipidemia, DM2, polyneuropathy, Stage IV CKD, subcortical CVA  PAIN:  Are you having pain? No  PRECAUTIONS: Fall  WEIGHT BEARING RESTRICTIONS No  FALLS: Has patient fallen in last 6 months? No  LIVING ENVIRONMENT: Lives with: lives with their son and lives with their daughter Lives in: House/apartment Stairs: Yes: External: 1 steps; none Has following equipment at home: Environmental consultant - 2 wheeled, Environmental consultant - 4 wheeled, Wheelchair (manual), Shower bench, bed side commode, and Grab bars  PLOF: Needs assistance with ADLs, Needs assistance with homemaking, and Needs assistance with gait, needs some help with bed mobility (has a hospital bed)  PATIENT GOALS "To get better so I can get out of here."  OBJECTIVE:   DIAGNOSTIC FINDINGS: MRI Brain 04/23/2021:  Small patchy acute cortical/subcortical infarcts within the left parietal lobe.  COGNITION: Overall cognitive status: Impaired: Memory: Impaired: Working Short term Long term Glass blower/designer function: Impaired: Problem  solving and Slow processing   SENSATION: Unable to formally assess due to difficulty following commands.  Able to identify one area on right lower leg correctly.  COORDINATION: Unable to formally assess due to body habitus.  EDEMA:  Mild edema noted bilaterally-pt states this is new, daughter states she has been having more swelling the past 2 weeks.  MUSCLE TONE: None noted bilaterally  POSTURE: forward head  LOWER EXTREMITY ROM:     Active  Right Eval Left Eval  Hip flexion [facility]  Hip extension    Hip abduction    Hip adduction    Hip internal rotation    Hip external rotation    Knee flexion [facility]  Knee extension    Ankle dorsiflexion    Ankle plantarflexion    Ankle inversion    Ankle eversion     (Blank rows = not tested)  LOWER EXTREMITY MMT:    MMT Right Eval Left Eval  Hip flexion 3/5 3/5  Hip extension    Hip abduction 3+/5 3+/5  Hip adduction    Hip internal rotation    Hip external rotation    Knee flexion 4-/5 4-/5  Knee extension 4+/5 4+/5  Ankle dorsiflexion 4-/5 4-/5  Ankle plantarflexion    Ankle inversion    Ankle eversion    (Blank rows = not tested)  BED MOBILITY:  Sit to supine Daughter states managing the LLE Supine to sit Daughter states managing the LLE Rolling to Right Complete Independence Rolling to Left Complete Independence  TRANSFERS: Assistive device utilized: Environmental consultant - 4 wheeled  Sit to stand: Modified independence Stand to sit: Modified independence Chair to chair: SBA  GAIT: Gait pattern: trunk flexed, poor foot clearance- Right, and poor foot clearance- Left Distance walked: 57' (following 10MWT) Assistive device utilized: Environmental consultant - 4 wheeled Level of assistance: Modified independence and SBA Comments: Pt ambulates with decreased speed and requires multimodal cuing for improved posture that she is unable to consistently maintain.  FUNCTIONAL TESTs:  5 times sit to stand: 22.37 sec on second attempt using one  step commands Timed up and go (TUG): 29 seconds w/ rollator and inc one step commands 10 meter walk test: 23.87 seconds w/ rollator = 0.42 m/sec OR 1.38 ft/sec Berg Balance Scale: To be assessed next session.  PATIENT SURVEYS:  FOTO not  applicable.  TODAY'S TREATMENT:  N/A   PATIENT EDUCATION: Education details: PT POC, assessments used, and goals set. Person educated: Patient and Child(ren) Education method: Customer service manager Education comprehension: verbalized understanding and needs further education   HOME EXERCISE PROGRAM: To be established.    GOALS: Goals reviewed with patient? Yes  SHORT TERM GOALS: Target date: 09/13/2021  Pt will be independent with strength and balance HEP with supervision from family. Baseline:  To be established. Goal status: INITIAL  2.  Pt will negotiate curb step x1 supervision level in order to improve safety with entering and exiting home environment. Baseline: To be further assessed. Goal status: INITIAL  3.  Pt will decrease 5xSTS to </=19 seconds in order to demonstrate decreased risk for falls and improved functional bilateral LE strength and power. Baseline: 22.37 sec w/ BUE support Goal status: INITIAL  4.  Pt will demonstrate TUG of </=25 seconds in order to decrease risk of falls and improve functional mobility using LRAD. Baseline: 29 seconds w/ rollator Goal status: INITIAL  5.  Pt will demonstrate a gait speed of >/= 1.6 feet/sec in order to decrease risk for falls. Baseline: 1.38 ft/sec Goal status: INITIAL  6.  BERG to be assessed with STG/LTG set as appropriate. Baseline:  To be assessed. Goal status: INITIAL  LONG TERM GOALS: Target date: 10/11/2021  Pt will be independent with advanced HEP for strength and balance. Baseline: To be established. Goal status: INITIAL  2.  Pt will decrease 5xSTS to </=16 seconds in order to demonstrate decreased risk for falls and improved functional bilateral LE  strength and power. Baseline: 22.37 sec w/ BUE support Goal status: INITIAL  3.  Pt will demonstrate TUG of </=20 seconds in order to decrease risk of falls and improve functional mobility using LRAD. Baseline: 29 seconds w/ rollator Goal status: INITIAL  4.  Pt will demonstrate a gait speed of >/=1.9 feet/sec in order to decrease risk for falls. Baseline: 1.38 ft/sec Goal status: INITIAL  5.  BERG to be assessed with STG/LTG set as appropriate. Baseline: To be assessed. Goal status: INITIAL  ASSESSMENT:  CLINICAL IMPRESSION: Patient is a 81 y.o. female who was seen today for physical therapy evaluation and treatment following cortical/subcortical infarcts within the left parietal lobe.  Pt has a significant PMH of HTN, hyperlipidemia, DM2, polyneuropathy, Stage IV CKD, and subcortical CVA.  Identified impairments include gait deviations, generalized LE weakness, difficulty with functional mobility including intermittent need for bed mobility assist, assistance needed for ADLs and IADLs.  Evaluation via the following assessment tools: 5xSTS, TUG, and 10MWT indicate fall risk. BERG to be assessed at next visit. They would benefit from skilled PT to address impairments as noted and progress towards long term goals.  OBJECTIVE IMPAIRMENTS Abnormal gait, decreased activity tolerance, decreased balance, decreased endurance, decreased mobility, difficulty walking, and decreased strength.   ACTIVITY LIMITATIONS carrying, lifting, squatting, stairs, transfers, bed mobility, and locomotion level  PARTICIPATION LIMITATIONS: cleaning, driving, and community activity  PERSONAL FACTORS Age, Fitness, Past/current experiences, Time since onset of injury/illness/exacerbation, and 1-2 comorbidities: HTN, hyperlipidemia, DM2, polyneuropathy, Stage IV CKD, subcortical CVA  are also affecting patient's functional outcome.   REHAB POTENTIAL: Fair See PMH and personal factors.  CLINICAL DECISION MAKING:  Stable/uncomplicated  EVALUATION COMPLEXITY: Low  PLAN: PT FREQUENCY: 1x/week  PT DURATION: 8 weeks  PLANNED INTERVENTIONS: Therapeutic exercises, Therapeutic activity, Neuromuscular re-education, Balance training, Gait training, Patient/Family education, Joint mobilization, Stair training, Vestibular training, DME instructions, Manual therapy, and  Re-evaluation  PLAN FOR NEXT SESSION: Assess BERG and set STG/LTG, Establish HEP for strength and static balance   Bary Richard, PT, DPT 08/16/2021, 1:57 PM

## 2021-08-15 NOTE — Progress Notes (Signed)
Received home health orders orders from [place]. Start of care 05/28/2021.   Certification and orders from 05/28/21 through 07/26/21 are reviewed, signed and faxed back to home health company.  Need of intermittent skilled services at home: PT/OT/ST  The home health care plan has been established by me and will be reviewed and updated as needed to maximize patient recovery.  I certify that all home health services have been and will be furnished to the patient while under my care.  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

## 2021-08-19 ENCOUNTER — Other Ambulatory Visit: Payer: Self-pay

## 2021-08-19 MED ORDER — ISOSORBIDE MONONITRATE ER 60 MG PO TB24: 60.0000 mg | ORAL_TABLET | Freq: Every day | ORAL | 1 refills | Status: DC

## 2021-08-19 MED ORDER — ATORVASTATIN CALCIUM 40 MG PO TABS: 40.0000 mg | ORAL_TABLET | Freq: Every day | ORAL | 1 refills | Status: DC

## 2021-08-19 MED ORDER — APIXABAN 2.5 MG PO TABS: 2.5000 mg | ORAL_TABLET | Freq: Two times a day (BID) | ORAL | 1 refills | Status: DC

## 2021-08-20 ENCOUNTER — Encounter: Payer: Medicare Other | Admitting: Occupational Therapy

## 2021-08-20 ENCOUNTER — Ambulatory Visit: Payer: Medicare Other | Admitting: Physical Therapy

## 2021-08-22 ENCOUNTER — Encounter: Payer: Self-pay | Admitting: Neurology

## 2021-08-22 ENCOUNTER — Ambulatory Visit: Payer: Medicare Other | Admitting: Neurology

## 2021-08-22 VITALS — BP 139/81 | HR 55 | Ht 66.0 in | Wt 199.8 lb

## 2021-08-22 DIAGNOSIS — I63412 Cerebral infarction due to embolism of left middle cerebral artery: Secondary | ICD-10-CM

## 2021-08-22 DIAGNOSIS — I480 Paroxysmal atrial fibrillation: Secondary | ICD-10-CM | POA: Diagnosis not present

## 2021-08-22 DIAGNOSIS — R569 Unspecified convulsions: Secondary | ICD-10-CM | POA: Diagnosis not present

## 2021-08-22 NOTE — Patient Instructions (Signed)
I had a long d/w patient and her daughter about her recent embolic stroke, focal seizures and diagnosis of atrial fibrillation risk for recurrent stroke/TIAs, personally independently reviewed imaging studies and stroke evaluation results and answered questions.Continue Eliquis 2.5 mg twice daily for secondary stroke prevention and maintain strict control of hypertension with blood pressure goal below 130/90, diabetes with hemoglobin A1c goal below 6.5% and lipids with LDL cholesterol goal below 70 mg/dL. I also advised the patient to eat a healthy diet with plenty of whole grains, cereals, fruits and vegetables, exercise regularly and maintain ideal body weight .she was advised to use her wheeled walker at all times and discussed fall safety precautions.  Continue Keppra for seizure prophylaxis for her focal seizures.  Continue ongoing physical occupational therapy.  Followup in the future with my nurse practitioner in 6 months or call earlier if necessary. Stroke Prevention Some medical conditions and behaviors can lead to a higher chance of having a stroke. You can help prevent a stroke by eating healthy, exercising, not smoking, and managing any medical conditions you have. Stroke is a leading cause of functional impairment. Primary prevention is particularly important because a majority of strokes are first-time events. Stroke changes the lives of not only those who experience a stroke but also their family and other caregivers. How can this condition affect me? A stroke is a medical emergency and should be treated right away. A stroke can lead to brain damage and can sometimes be life-threatening. If a person gets medical treatment right away, there is a better chance of surviving and recovering from a stroke. What can increase my risk? The following medical conditions may increase your risk of a stroke: Cardiovascular disease. High blood pressure (hypertension). Diabetes. High cholesterol. Sickle  cell disease. Blood clotting disorders (hypercoagulable state). Obesity. Sleep disorders (obstructive sleep apnea). Other risk factors include: Being older than age 68. Having a history of blood clots, stroke, or mini-stroke (transient ischemic attack, TIA). Genetic factors, such as race, ethnicity, or a family history of stroke. Smoking cigarettes or using other tobacco products. Taking birth control pills, especially if you also use tobacco. Heavy use of alcohol or drugs, especially cocaine and methamphetamine. Physical inactivity. What actions can I take to prevent this? Manage your health conditions High cholesterol levels. Eating a healthy diet is important for preventing high cholesterol. If cholesterol cannot be managed through diet alone, you may need to take medicines. Take any prescribed medicines to control your cholesterol as told by your health care provider. Hypertension. To reduce your risk of stroke, try to keep your blood pressure below 130/80. Eating a healthy diet and exercising regularly are important for controlling blood pressure. If these steps are not enough to manage your blood pressure, you may need to take medicines. Take any prescribed medicines to control hypertension as told by your health care provider. Ask your health care provider if you should monitor your blood pressure at home. Have your blood pressure checked every year, even if your blood pressure is normal. Blood pressure increases with age and some medical conditions. Diabetes. Eating a healthy diet and exercising regularly are important parts of managing your blood sugar (glucose). If your blood sugar cannot be managed through diet and exercise, you may need to take medicines. Take any prescribed medicines to control your diabetes as told by your health care provider. Get evaluated for obstructive sleep apnea. Talk to your health care provider about getting a sleep evaluation if you snore a lot or  have excessive sleepiness. Make sure that any other medical conditions you have, such as atrial fibrillation or atherosclerosis, are managed. Nutrition Follow instructions from your health care provider about what to eat or drink to help manage your health condition. These instructions may include: Reducing your daily calorie intake. Limiting how much salt (sodium) you use to 1,500 milligrams (mg) each day. Using only healthy fats for cooking, such as olive oil, canola oil, or sunflower oil. Eating healthy foods. You can do this by: Choosing foods that are high in fiber, such as whole grains, and fresh fruits and vegetables. Eating at least 5 servings of fruits and vegetables a day. Try to fill one-half of your plate with fruits and vegetables at each meal. Choosing lean protein foods, such as lean cuts of meat, poultry without skin, fish, tofu, beans, and nuts. Eating low-fat dairy products. Avoiding foods that are high in sodium. This can help lower blood pressure. Avoiding foods that have saturated fat, trans fat, and cholesterol. This can help prevent high cholesterol. Avoiding processed and prepared foods. Counting your daily carbohydrate intake.  Lifestyle If you drink alcohol: Limit how much you have to: 0-1 drink a day for women who are not pregnant. 0-2 drinks a day for men. Know how much alcohol is in your drink. In the [place] one drink equals one 12 oz bottle of beer (339m), one 5 oz glass of wine (1437m, or one 1 oz glass of hard liquor (4420m Do not use any products that contain nicotine or tobacco. These products include cigarettes, chewing tobacco, and vaping devices, such as e-cigarettes. If you need help quitting, ask your health care provider. Avoid secondhand smoke. Do not use drugs. Activity  Try to stay at a healthy weight. Get at least 30 minutes of exercise on most days, such as: Fast walking. Biking. Swimming. Medicines Take over-the-counter and  prescription medicines only as told by your health care provider. Aspirin or blood thinners (antiplatelets or anticoagulants) may be recommended to reduce your risk of forming blood clots that can lead to stroke. Avoid taking birth control pills. Talk to your health care provider about the risks of taking birth control pills if: You are over 35 27ars old. You smoke. You get very bad headaches. You have had a blood clot. Where to find more information American Stroke Association: www.strokeassociation.org Get help right away if: You or a loved one has any symptoms of a stroke. "BE FAST" is an easy way to remember the main warning signs of a stroke: B - Balance. Signs are dizziness, sudden trouble walking, or loss of balance. E - Eyes. Signs are trouble seeing or a sudden change in vision. F - Face. Signs are sudden weakness or numbness of the face, or the face or eyelid drooping on one side. A - Arms. Signs are weakness or numbness in an arm. This happens suddenly and usually on one side of the body. S - Speech. Signs are sudden trouble speaking, slurred speech, or trouble understanding what people say. T - Time. Time to call emergency services. Write down what time symptoms started. You or a loved one has other signs of a stroke, such as: A sudden, severe headache with no known cause. Nausea or vomiting. Seizure. These symptoms may represent a serious problem that is an emergency. Do not wait to see if the symptoms will go away. Get medical help right away. Call your local emergency services (911 in the [place] Do not drive yourself to the hospital.  Summary You can help to prevent a stroke by eating healthy, exercising, not smoking, limiting alcohol intake, and managing any medical conditions you may have. Do not use any products that contain nicotine or tobacco. These include cigarettes, chewing tobacco, and vaping devices, such as e-cigarettes. If you need help quitting, ask your health care  provider. Remember "BE FAST" for warning signs of a stroke. Get help right away if you or a loved one has any of these signs. This information is not intended to replace advice given to you by your health care provider. Make sure you discuss any questions you have with your health care provider. Document Revised: 09/26/2019 Document Reviewed: 09/26/2019 Elsevier Patient Education  [employer].

## 2021-08-22 NOTE — Progress Notes (Signed)
[facility] [address] [place]. [place] [address] [phone]       OFFICE FOLLOW-UP NOTE  [name] Date of Birth:  February 05, 1941 Medical Record Number:  [id number]   HPI: Rita Yoder is a 81 year old pleasant African-American lady seen today for initial office follow-up visit following hospital admission for stroke in February 2023.  She is accompanied by her daughter today.  History is obtained from them and review of electronic medical records.  I have personally reviewed pertinent available imaging films in PACS.  She has past medical history of diabetes, hypertension, chronic kidney disease who presented on 04/23/2021 with word finding difficulties and right arm weakness and tingling.  In the waiting room she got worse with gaze deviation and not speaking and so code stroke was activated.  Her symptoms resolved by the time she was seen and returned from the [place].  Blood pressure was found to be elevated at 417 systolic.  Noncontrast CT scan of the head showed hypodensity in the left thalamus compatible with age-indeterminate infarct however MRI scan showed small patchy acute cortical and subcortical infarcts in the left parietal lobe with changes of small vessel disease.  There was also changes of remote age subarachnoid hemorrhage microhemorrhage in the right parieto-occipital lobe raising question for possible amyloid angiopathy.  MR angiogram of the brain showed no large vessel stenosis or occlusion.  2D echo showed ejection fraction of 60 to 65% with mild left ventricular hypertrophy and and left atrial dilatation.  LDL cholesterol 78 mg percent.  Hemoglobin A1c is 4.5.  Patient was subsequently found to have be in atrial fibrillation.  She was on aspirin prior to admission and subsequently was switched to Eliquis 2.5 twice daily.  Patient had episode of right upper extremity twitching's which were diagnosed as focal seizure and she was started on Keppra.   EEG could not be completed as patient had a glued and weakness which could not be removed.  Patient states she is done well since discharge.  Her speech has improved though she still has some word hesitancy and has some good days and bad days with her speech.  Right upper extremity is improving but she still has significant weakness of right grip and intrinsic hand muscles and fine motor skills are diminished.  She is ambulating better but does use a wheeled walker all the times.  She has had no falls or injuries.  She is currently doing outpatient physical and speech therapy.  She has no new complaints today.  ROS:   14 system review of systems is positive for speech difficulties, word finding difficulties, imbalance and all other systems negative  PMH:  Past Medical History:  Diagnosis Date   Chronic kidney disease 12/2009   Diabetes mellitus 2010   Dyslipidemia    Hypertension    Monoclonal gammopathy 03/2010    Social History:  Social History   Socioeconomic History   Marital status: Married    Spouse name: Not on file   Number of children: 4   Years of education: Not on file   Highest education level: Not on file  Occupational History   Occupation: retrired  Tobacco Use   Smoking status: Never   Smokeless tobacco: Never  Vaping Use   Vaping Use: Never used  Substance and Sexual Activity   Alcohol use: No   Drug use: Never   Sexual activity: Not Currently  Other Topics Concern   Not on file  Social History Narrative  Not on file   Social Determinants of Health   Financial Resource Strain: Low Risk  (12/20/2020)   Overall Financial Resource Strain (CARDIA)    Difficulty of Paying Living Expenses: Not hard at all  Food Insecurity: No Food Insecurity (12/20/2020)   Hunger Vital Sign    Worried About Running Out of Food in the Last Year: Never true    Ran Out of Food in the Last Year: Never true  Transportation Needs: No Transportation Needs (12/20/2020)   PRAPARE -  Hydrologist (Medical): No    Lack of Transportation (Non-Medical): No  Physical Activity: Inactive (12/20/2020)   Exercise Vital Sign    Days of Exercise per Week: 0 days    Minutes of Exercise per Session: 0 min  Stress: No Stress Concern Present (12/20/2020)   [place]    Feeling of Stress : Not at all  Social Connections: Not on file  Intimate Partner Violence: Not At Risk (01/20/2018)   Humiliation, Afraid, Rape, and Kick questionnaire    Fear of Current or Ex-Partner: No    Emotionally Abused: No    Physically Abused: No    Sexually Abused: No    Medications:   Current Outpatient Medications on File Prior to Visit  Medication Sig Dispense Refill   amiodarone (PACERONE) 200 MG tablet Take 1/2 tablets (100 mg total) by mouth daily. 90 tablet 1   amLODipine (NORVASC) 10 MG tablet Take 1 tablet (10 mg total) by mouth daily. 90 tablet 1   apixaban (ELIQUIS) 2.5 MG TABS tablet Take 1 tablet (2.5 mg total) by mouth 2 (two) times daily. 180 tablet 1   atorvastatin (LIPITOR) 40 MG tablet Take 1 tablet (40 mg total) by mouth daily. 90 tablet 1   Blood Glucose Calibration (OT ULTRA/FASTTK CNTRL SOLN) SOLN      Cholecalciferol (VITAMIN D3) 50 MCG (2000 UT) TABS Take 2,000 Units by mouth daily. 30 tablet 0   isosorbide mononitrate (IMDUR) 60 MG 24 hr tablet Take 1 tablet (60 mg total) by mouth daily. 90 tablet 1   levETIRAcetam (KEPPRA) 500 MG tablet Take 1 tablet (500 mg total) by mouth 2 (two) times daily. 60 tablet 3   levothyroxine (SYNTHROID) 25 MCG tablet Take 1 tablet (25 mcg total) by mouth daily before breakfast. 30 tablet 0   Menthol-Methyl Salicylate (MUSCLE RUB) 10-15 % CREA Apply 1 application. topically 2 (two) times daily.  0   MODERNA COVID-19 BIVAL BOOSTER 50 MCG/0.5ML injection      nitroGLYCERIN (NITROSTAT) 0.4 MG SL tablet DISSOLVE 1 TABLET UNDER THE TONGUE AT FIRST SIGN OF  ATTACKS, MAY REPEAT EVERY 5 MINUTES UP TO 3 TABLETS, IF NO RELLIEF SEEK MEDICAL HELP (Patient taking differently: Place 0.4 mg under the tongue every 5 (five) minutes x 3 doses as needed for chest pain (SEEK HELP, IF NO RELIEF).) 25 tablet 1   polyethylene glycol (MIRALAX / GLYCOLAX) 17 g packet Take 17 g by mouth 2 (two) times daily. 14 each 0   senna (SENOKOT) 8.6 MG TABS tablet Take 1 tablet (8.6 mg total) by mouth daily. 120 tablet 0   solifenacin (VESICARE) 5 MG tablet Take 5 mg by mouth daily.     No current facility-administered medications on file prior to visit.    Allergies:   Allergies  Allergen Reactions   Losartan Other (See Comments)    MD discontinued this because of decreased kidney function  Physical Exam General: Obese pleasant elderly African-American lady, seated, in no evident distress Head: head normocephalic and atraumatic.  Neck: supple with no carotid or supraclavicular bruits Cardiovascular: regular rate and rhythm, no murmurs Musculoskeletal: no deformity Skin:  no rash/petichiae Vascular:  Normal pulses all extremities Vitals:   08/22/21 1534  BP: 139/81  Pulse: (!) 55   Neurologic Exam Mental Status: Awake and fully alert. Oriented to place and time. Recent and remote memory intact. Attention span, concentration and fund of knowledge appropriate. Mood and affect appropriate.  Nonfluent speech with mild expressive language difficulties with some word finding difficulties.  Occasional paraphasias.  Good comprehension, naming and repetition. Cranial Nerves: Fundoscopic exam reveals sharp disc margins. Pupils equal, briskly reactive to light. Extraocular movements full without nystagmus. Visual fields full to confrontation. Hearing intact. Facial sensation intact.  Face, tongue, palate moves normally and symmetrically.  Motor: Mild right upper extremity drift.  Weakness of right grip and intrinsic hand muscles.  Fine finger movements are diminished on the  right.  Orbits left over right upper extremity. Sensory.: intact to touch ,pinprick .position and vibratory sensation.  Coordination: Rapid alternating movements normal in all extremities. Finger-to-nose and heel-to-shin performed accurately bilaterally. Gait and Station: Arises from chair without difficulty. Stance is normal. Gait demonstrates normal stride length and balance she uses a wheeled walker..  Tandem walking not tested.  Reflexes: 1+ and symmetric. Toes downgoing.   NIHSS  2 Modified Rankin  2   ASSESSMENT: 81 year old African-American lady with embolic left parietal MCA branch infarct in February 2023 from subsequently discovered paroxysmal A-fib.  Vascular risk factors of A-fib, hypertension, hyperlipidemia and obesity.  She also had focal seizures involving right upper extremity which appear to be well controlled since starting Keppra.     PLAN: I had a long d/w patient and her daughter about her recent embolic stroke, focal seizures and diagnosis of atrial fibrillation risk for recurrent stroke/TIAs, personally independently reviewed imaging studies and stroke evaluation results and answered questions.Continue Eliquis 2.5 mg twice daily for secondary stroke prevention and maintain strict control of hypertension with blood pressure goal below 130/90, diabetes with hemoglobin A1c goal below 6.5% and lipids with LDL cholesterol goal below 70 mg/dL. I also advised the patient to eat a healthy diet with plenty of whole grains, cereals, fruits and vegetables, exercise regularly and maintain ideal body weight .she was advised to use her wheeled walker at all times and discussed fall safety precautions.  Continue Keppra for seizure prophylaxis for her focal seizures.  Continue ongoing physical occupational therapy.  Followup in the future with my nurse practitioner in 6 months or call earlier if necessary. Greater than 50% of time during this 40 minute visit was spent on counseling,explanation  of diagnosis, planning of further management, discussion with patient and family and coordination of care Antony Contras, MD Note: This document was prepared with digital dictation and possible smart phrase technology. Any transcriptional errors that result from this process are unintentional

## 2021-08-26 ENCOUNTER — Encounter: Payer: Self-pay | Admitting: Internal Medicine

## 2021-08-26 ENCOUNTER — Other Ambulatory Visit: Payer: Self-pay

## 2021-08-26 ENCOUNTER — Ambulatory Visit (INDEPENDENT_AMBULATORY_CARE_PROVIDER_SITE_OTHER): Payer: Medicare Other | Admitting: Internal Medicine

## 2021-08-26 VITALS — BP 120/72 | HR 70 | Temp 98.6°F | Ht 66.0 in | Wt 199.2 lb

## 2021-08-26 DIAGNOSIS — Z2821 Immunization not carried out because of patient refusal: Secondary | ICD-10-CM

## 2021-08-26 DIAGNOSIS — N1832 Chronic kidney disease, stage 3b: Secondary | ICD-10-CM

## 2021-08-26 DIAGNOSIS — I130 Hypertensive heart and chronic kidney disease with heart failure and stage 1 through stage 4 chronic kidney disease, or unspecified chronic kidney disease: Secondary | ICD-10-CM

## 2021-08-26 DIAGNOSIS — E039 Hypothyroidism, unspecified: Secondary | ICD-10-CM

## 2021-08-26 DIAGNOSIS — I5032 Chronic diastolic (congestive) heart failure: Secondary | ICD-10-CM | POA: Diagnosis not present

## 2021-08-26 DIAGNOSIS — Z6832 Body mass index (BMI) 32.0-32.9, adult: Secondary | ICD-10-CM

## 2021-08-26 DIAGNOSIS — E1122 Type 2 diabetes mellitus with diabetic chronic kidney disease: Secondary | ICD-10-CM

## 2021-08-26 DIAGNOSIS — I69320 Aphasia following cerebral infarction: Secondary | ICD-10-CM

## 2021-08-26 DIAGNOSIS — E6609 Other obesity due to excess calories: Secondary | ICD-10-CM

## 2021-08-26 MED ORDER — AMLODIPINE BESYLATE 5 MG PO TABS: 5.0000 mg | ORAL_TABLET | Freq: Every day | ORAL | 2 refills | Status: DC

## 2021-08-26 MED ORDER — AMIODARONE HCL 200 MG PO TABS: 100.0000 mg | ORAL_TABLET | Freq: Every day | ORAL | 1 refills | Status: DC

## 2021-08-26 MED ORDER — LEVETIRACETAM 500 MG PO TABS: 500.0000 mg | ORAL_TABLET | Freq: Two times a day (BID) | ORAL | 3 refills | Status: DC

## 2021-08-26 NOTE — Progress Notes (Unsigned)
Rich Brave [place],acting as a Education administrator for [name], MD.,have documented all relevant documentation on the behalf of [name], MD,as directed by  [name], MD while in the presence of [name], MD.  This visit occurred during the SARS-CoV-2 public health emergency.  Safety protocols were in place, including screening questions prior to the visit, additional usage of staff PPE, and extensive cleaning of exam room while observing appropriate contact time as indicated for disinfecting solutions.  Subjective:     Patient ID: Rita Yoder , female    DOB: 1940/06/14 , 81 y.o.   MRN: [id number]   Chief Complaint  Patient presents with   Diabetes   Hypertension    HPI  She presents today for diabetes and BP check.  She is accompanied by her daughter today. She reports compliance with meds. She denies headaches, chest pain and shortness of breath. Patient's daughter stated Dr.Harwani changed patient's amlodipine '10mg'$  to '5mg'$ . She wanted to know if she could send in the '5mg'$  so she doesn't have to cut the pill.  Diabetes She presents for her follow-up diabetic visit. She has type 2 diabetes mellitus. Her disease course has been stable. There are no hypoglycemic associated symptoms. There are no diabetic associated symptoms. Pertinent negatives for diabetes include no polydipsia, no polyphagia and no polyuria. Diabetic complications include nephropathy. Risk factors for coronary artery disease include post-menopausal, sedentary lifestyle and hypertension. Current diabetic treatment includes diet.  Hypertension This is a chronic problem. The current episode started more than 1 year ago. The problem has been gradually improving since onset. The problem is controlled. The current treatment provides moderate improvement. Hypertensive end-organ damage includes kidney disease.     Past Medical History:  Diagnosis Date   Chronic kidney disease 12/2009   Diabetes mellitus  2010   Dyslipidemia    Hypertension    Monoclonal gammopathy 03/2010     Family History  Problem Relation Age of Onset   Healthy Mother    Heart attack Brother    Cancer Maternal Aunt    Cancer Maternal Uncle    Heart Problems Maternal Grandmother      Current Outpatient Medications:    amiodarone (PACERONE) 200 MG tablet, Take 1/2 tablets (100 mg total) by mouth daily., Disp: 90 tablet, Rfl: 1   amLODipine (NORVASC) 10 MG tablet, Take 1 tablet (10 mg total) by mouth daily. (Patient taking differently: Take 5 mg by mouth daily.), Disp: 90 tablet, Rfl: 1   apixaban (ELIQUIS) 2.5 MG TABS tablet, Take 1 tablet (2.5 mg total) by mouth 2 (two) times daily., Disp: 180 tablet, Rfl: 1   atorvastatin (LIPITOR) 40 MG tablet, Take 1 tablet (40 mg total) by mouth daily., Disp: 90 tablet, Rfl: 1   Blood Glucose Calibration (OT ULTRA/FASTTK CNTRL SOLN) SOLN, , Disp: , Rfl:    Cholecalciferol (VITAMIN D3) 50 MCG (2000 UT) TABS, Take 2,000 Units by mouth daily., Disp: 30 tablet, Rfl: 0   isosorbide mononitrate (IMDUR) 60 MG 24 hr tablet, Take 1 tablet (60 mg total) by mouth daily., Disp: 90 tablet, Rfl: 1   levETIRAcetam (KEPPRA) 500 MG tablet, Take 1 tablet (500 mg total) by mouth 2 (two) times daily., Disp: 60 tablet, Rfl: 3   levothyroxine (SYNTHROID) 25 MCG tablet, Take 1 tablet (25 mcg total) by mouth daily before breakfast., Disp: 30 tablet, Rfl: 0   Menthol-Methyl Salicylate (MUSCLE RUB) 10-15 % CREA, Apply 1 application. topically 2 (two) times daily., Disp: ,  Rfl: 0   MODERNA COVID-19 BIVAL BOOSTER 50 MCG/0.5ML injection, , Disp: , Rfl:    nitroGLYCERIN (NITROSTAT) 0.4 MG SL tablet, DISSOLVE 1 TABLET UNDER THE TONGUE AT FIRST SIGN OF ATTACKS, MAY REPEAT EVERY 5 MINUTES UP TO 3 TABLETS, IF NO RELLIEF SEEK MEDICAL HELP (Patient taking differently: Place 0.4 mg under the tongue every 5 (five) minutes x 3 doses as needed for chest pain (SEEK HELP, IF NO RELIEF).), Disp: 25 tablet, Rfl: 1    polyethylene glycol (MIRALAX / GLYCOLAX) 17 g packet, Take 17 g by mouth 2 (two) times daily., Disp: 14 each, Rfl: 0   senna (SENOKOT) 8.6 MG TABS tablet, Take 1 tablet (8.6 mg total) by mouth daily., Disp: 120 tablet, Rfl: 0   solifenacin (VESICARE) 5 MG tablet, Take 5 mg by mouth daily., Disp: , Rfl:    Allergies  Allergen Reactions   Losartan Other (See Comments)    MD discontinued this because of decreased kidney function     Review of Systems  Constitutional: Negative.   Respiratory: Negative.    Cardiovascular: Negative.   Gastrointestinal: Negative.   Endocrine: Negative for polydipsia, polyphagia and polyuria.  Neurological: Negative.   Psychiatric/Behavioral: Negative.       Today's Vitals   08/26/21 1553  BP: 120/72  Pulse: 70  Temp: 98.6 F (37 C)  Weight: 199 lb 3.2 oz (90.4 kg)  Height: '5\' 6"'$  (1.676 m)  PainSc: 0-No pain   Body mass index is 32.15 kg/m.  Wt Readings from Last 3 Encounters:  08/26/21 199 lb 3.2 oz (90.4 kg)  08/22/21 199 lb 12.8 oz (90.6 kg)  07/17/21 204 lb (92.5 kg)     Objective:  Physical Exam Vitals and nursing note reviewed.  Constitutional:      Appearance: Normal appearance. She is obese.  HENT:     Head: Normocephalic and atraumatic.  Cardiovascular:     Rate and Rhythm: Normal rate and regular rhythm.     Heart sounds: Normal heart sounds.  Pulmonary:     Effort: Pulmonary effort is normal.     Breath sounds: Normal breath sounds.  Musculoskeletal:     Cervical back: Normal range of motion.     Comments: Ambulatory w/ walker  Skin:    General: Skin is warm.  Neurological:     General: No focal deficit present.     Mental Status: She is alert.  Psychiatric:        Mood and Affect: Mood normal.        Behavior: Behavior normal.         Assessment And Plan:     1. Type 2 diabetes mellitus with stage 3b chronic kidney disease, without long-term current use of insulin ([place])  2. Herpes zoster vaccination  declined  3. Class 1 obesity due to excess calories with body mass index (BMI) of 32.0 to 32.9 in adult, unspecified whether serious comorbidity present  4. 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 Patient verbalized understanding of the plan and was able to repeat key elements of the plan. All questions were answered to their satisfaction.  [name], MD   I, [name], MD, have reviewed all documentation for this visit. The documentation on 08/26/21 for the exam, diagnosis, procedures, and orders are all accurate and complete.   IF YOU HAVE BEEN REFERRED TO A SPECIALIST, IT MAY TAKE 1-2 WEEKS TO  SCHEDULE/PROCESS THE REFERRAL. IF YOU HAVE NOT HEARD FROM US/SPECIALIST IN TWO WEEKS, PLEASE GIVE [place] A CALL AT [phone].   THE PATIENT IS ENCOURAGED TO PRACTICE SOCIAL DISTANCING DUE TO THE COVID-19 PANDEMIC.

## 2021-08-26 NOTE — Patient Instructions (Signed)

## 2021-08-27 ENCOUNTER — Ambulatory Visit: Payer: Medicare Other

## 2021-08-27 ENCOUNTER — Ambulatory Visit: Payer: Medicare Other | Admitting: Physical Therapy

## 2021-08-27 NOTE — Therapy (Deleted)
OUTPATIENT SPEECH LANGUAGE PATHOLOGY TREATMENT NOTE   Patient Name: Rita Yoder MRN: [id number] DOB:09-16-1940, 81 y.o., female Today's Date: 08/27/2021  PCP: Cyndee Brightly MD REFERRING PROVIDER: Meredith Staggers, MD  END OF SESSION:    Past Medical History:  Diagnosis Date   Chronic kidney disease 12/2009   Diabetes mellitus 2010   Dyslipidemia    Hypertension    Monoclonal gammopathy 03/2010   Past Surgical History:  Procedure Laterality Date   CATARACT EXTRACTION Bilateral 12/2020   CHOLECYSTECTOMY     HERNIA REPAIR     ROTATOR CUFF REPAIR  03/10/1992   THYROID SURGERY     TONSILLECTOMY     TOTAL ABDOMINAL HYSTERECTOMY     Patient Active Problem List   Diagnosis Date Noted   AKI (acute kidney injury) ([place])    Right-sided visual neglect    Hyperglycemia    Subcortical infarction ([place]) 05/02/2021   Stroke ([place]) 04/24/2021   Stroke (cerebrum) ([place]) 04/23/2021   Chronic kidney disease, stage IV (severe) ([place]) 08/26/2020   Weight loss 08/26/2020   Abnormal thyroid blood test 08/26/2020   Left hip pain 08/26/2020   Atherosclerosis of aorta ([place]) 08/20/2020   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   Ecchymoses, spontaneous 06/14/2019   Class 3 severe obesity due to excess calories with serious comorbidity and body mass index (BMI) of 45.0 to 49.9 in adult ([place]) 06/14/2019   Diabetic polyneuropathy associated with type 2 diabetes mellitus ([place]) 01/06/2019   BMI 40.0-44.9, adult ([place]) 01/06/2019   Essential hypertension 11/25/2018   Mixed hyperlipidemia 11/25/2018   Precordial pain 11/25/2018   CKD (chronic kidney disease), symptom management only, stage 3 (moderate) ([place]) 04/20/2013    ONSET DATE: 05/02/2021   REFERRING DIAG: I63.9 (ICD-10-CM) - Subcortical infarction   THERAPY DIAG:  No diagnosis found.  Rationale for Evaluation and Treatment Rehabilitation  SUBJECTIVE: ***  PAIN:  Are you having pain? {yes/no:20286} NPRS scale: ***/10 Pain  location: *** Pain orientation: {Pain Orientation:25161}  PAIN TYPE: {type:313116} Pain description: {PAIN DESCRIPTION:21022940}  Aggravating factors: *** Relieving factors: ***    OBJECTIVE:   TODAY'S TREATMENT:  08-27-21:   08-12-21: Educated patient on evaluation results and clinical observations. Recommended speech therapy intervention to address aphasia, apraxia, and cognitive linguistic deficits. Pt and her son verbalized understanding and agreement.      PATIENT EDUCATION: Education details: see above Person educated: Patient and Child(ren) Education method: Customer service manager Education comprehension: verbalized understanding, returned demonstration, and needs further education    GOALS: Goals reviewed with patient? Yes   SHORT TERM GOALS: Target date: 09/13/2021    Pt will verbalize 5-10 items in personally relevant categories with occasional mod A over 2 sessions  Baseline: Goal status: ongoing  2.  Pt will utilize anomia compensations and multimodal communication to augment verbal expression as needed on structured tasks with occasional mod A over 2 sessions Baseline:  Goal status: ongoing   3.  Pt will ID and attempt to correct apraxic/articulation errors on structured tasks given occasional mod A over 2 sessions Baseline:  Goal status: ongoing   4.  Pt will demonstrate ability to recall personally relevant information with use of external aids as needed given occasional mod A over 2 sessions Baseline:  Goal status: ongoing     LONG TERM GOALS: Target date: 11/08/2021     Pt will utilize anomia compensations and multimodal communication to augment verbal expression in simple conversation with occasional min A over 2 sessions Baseline:  Goal status: ongoing  2.  Pt will ID and attempt to correct apraxic/articulation errors in simple conversation given occasional mod A over 2 sessions Baseline:  Goal status: ongoing   3.  Pt will demonstrate  ability to recall personally relevant information with use of external aids as needed given occasional min A over 2 sessions Baseline:  Goal status: ongoing     ASSESSMENT:   CLINICAL IMPRESSION: Patient is a 81 y.o. female who was seen today for cognitive communication changes post stroke in February 2023. Recently completed CIR and HH ST but pt unable to detail targeted ST intervention. Endorsed some ongoing difficulty with word retrieval and changes in memory, which is an "adjustment and aggravating." Various testing today revealed moderate memory and moderate language deficits, which appeared related to apraxia and aphasia. Pt intermittently used gesture x1 during naming assessment. Reading accuracy was sporadic with only few key words identified in opening sentence of passage. Visual changes endorsed (OT reported some right neglect). Overall adequate reading at word level noted during BNT Short Form. Pt's son reported overall significant improvements in speech and cognition since hospitalization; however, pt has not yet returned to baseline. Skilled ST intervention is warranted to address verbal apraxia, aphasia, and cognitive linguistic changes to maximize return to baseline and optimize QOL.   OBJECTIVE IMPAIRMENTS include attention, memory, executive functioning, aphasia, and apraxia. These impairments are limiting patient from household responsibilities, ADLs/IADLs, and effectively communicating at home and in community. Factors affecting potential to achieve goals and functional outcome are ability to learn/carryover information. Patient will benefit from skilled SLP services to address above impairments and improve overall function.   REHAB POTENTIAL: Good   PLAN: SLP FREQUENCY: 2x/week (pt may elect for 1x/week due to transportation)   SLP DURATION: 12 weeks   PLANNED INTERVENTIONS: Language facilitation, Environmental controls, Trials of upgraded texture/liquids, Cueing hierachy,  Cognitive reorganization, Internal/external aids, Functional tasks, Multimodal communication approach, SLP instruction and feedback, Compensatory strategies, and Patient/family education    Marzetta Board, [place] 08/27/2021, 10:02 AM

## 2021-08-29 LAB — INSULIN, RANDOM: INSULIN: 7.9 u[IU]/mL (ref 2.6–24.9)

## 2021-08-29 LAB — TSH+FREE T4
Free T4: 1.29 ng/dL (ref 0.82–1.77)
TSH: 20.1 u[IU]/mL — ABNORMAL HIGH (ref 0.450–4.500)

## 2021-09-02 ENCOUNTER — Encounter: Payer: Self-pay | Admitting: Physical Therapy

## 2021-09-02 ENCOUNTER — Ambulatory Visit: Payer: Medicare Other | Admitting: Physical Therapy

## 2021-09-02 ENCOUNTER — Encounter: Payer: Self-pay | Admitting: Speech Pathology

## 2021-09-02 ENCOUNTER — Ambulatory Visit: Payer: Medicare Other | Admitting: Speech Pathology

## 2021-09-02 DIAGNOSIS — I69351 Hemiplegia and hemiparesis following cerebral infarction affecting right dominant side: Secondary | ICD-10-CM | POA: Diagnosis not present

## 2021-09-02 DIAGNOSIS — R2681 Unsteadiness on feet: Secondary | ICD-10-CM

## 2021-09-02 DIAGNOSIS — R4701 Aphasia: Secondary | ICD-10-CM

## 2021-09-02 DIAGNOSIS — M6281 Muscle weakness (generalized): Secondary | ICD-10-CM

## 2021-09-02 DIAGNOSIS — R482 Apraxia: Secondary | ICD-10-CM

## 2021-09-02 DIAGNOSIS — R2689 Other abnormalities of gait and mobility: Secondary | ICD-10-CM

## 2021-09-02 DIAGNOSIS — R41841 Cognitive communication deficit: Secondary | ICD-10-CM

## 2021-09-02 NOTE — Therapy (Signed)
OUTPATIENT PHYSICAL THERAPY TREATMENT NOTE   Patient Name: Rita Yoder MRN: [id number] DOB:01-22-1941, 81 y.o., female Today's Date: 09/02/2021  PCP: Dorothyann Peng, MD REFERRING PROVIDER: Ranelle Oyster, MD   END OF SESSION:   PT End of Session - 09/02/21 1158     Visit Number 2    Number of Visits 9   8+eval   Date for PT Re-Evaluation 10/25/21   pushed out for scheduling   Authorization Type [employer] MEDICARE    Progress Note Due on Visit 10    PT Start Time 1153    PT Stop Time 1233    PT Time Calculation (min) 40 min    Equipment Utilized During Treatment Gait belt    Activity Tolerance Patient tolerated treatment well    Behavior During Therapy WFL for tasks assessed/performed             Past Medical History:  Diagnosis Date   Chronic kidney disease 12/2009   Diabetes mellitus 2010   Dyslipidemia    Hypertension    Monoclonal gammopathy 03/2010   Past Surgical History:  Procedure Laterality Date   CATARACT EXTRACTION Bilateral 12/2020   CHOLECYSTECTOMY     HERNIA REPAIR     ROTATOR CUFF REPAIR  03/10/1992   THYROID SURGERY     TONSILLECTOMY     TOTAL ABDOMINAL HYSTERECTOMY     Patient Active Problem List   Diagnosis Date Noted   AKI (acute kidney injury) ([facility])    Right-sided visual neglect    Hyperglycemia    Subcortical infarction ([facility]) 05/02/2021   Stroke ([facility]) 04/24/2021   Stroke (cerebrum) ([facility]) 04/23/2021   Chronic kidney disease, stage IV (severe) ([facility]) 08/26/2020   Weight loss 08/26/2020   Abnormal thyroid blood test 08/26/2020   Left hip pain 08/26/2020   Atherosclerosis of aorta ([facility]) 08/20/2020   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   Ecchymoses, spontaneous 06/14/2019   Class 3 severe obesity due to excess calories with serious comorbidity and body mass index (BMI) of 45.0 to 49.9 in adult [facility]) 06/14/2019   Diabetic polyneuropathy associated with type 2 diabetes mellitus ([facility]) 01/06/2019   BMI 40.0-44.9, adult ([facility])  01/06/2019   Essential hypertension 11/25/2018   Mixed hyperlipidemia 11/25/2018   Precordial pain 11/25/2018   CKD (chronic kidney disease), symptom management only, stage 3 (moderate) ([facility]) 04/20/2013    ONSET DATE: 04/23/2021 ([facility])   REFERRING DIAG: I63.9 (ICD-10-CM) - Subcortical infarction ([facility])   THERAPY DIAG:  Other abnormalities of gait and mobility  Muscle weakness (generalized)  Unsteadiness on feet  Rationale for Evaluation and Treatment Rehabilitation  PERTINENT HISTORY: HTN, hyperlipidemia, DM2, polyneuropathy, Stage IV CKD, subcortical CVA  PRECAUTIONS: Fall  SUBJECTIVE: No falls at home.  She has not been doing anything new.  No other complaints.  PAIN:  Are you having pain? No   OBJECTIVE: (objective measures completed at initial evaluation unless otherwise dated)   TODAY'S TREATMENT:  Assessed BERG:  [facility] PT Assessment - 09/02/21 1201       Standardized Balance Assessment   Standardized Balance Assessment Berg Balance Test      Berg Balance Test   Sit to Stand Able to stand  independently using hands    Standing Unsupported Able to stand 2 minutes with supervision    Sitting with Back Unsupported but Feet Supported on Floor or Stool Able to sit safely and securely 2 minutes    Stand to Sit Controls descent by using hands    Transfers Able to  transfer safely, definite need of hands   needs rest following   Standing Unsupported with Eyes Closed Able to stand 10 seconds with supervision    Standing Unsupported with Feet Together Needs help to attain position but able to stand for 30 seconds with feet together   Requires rest following   From Standing, Reach Forward with Outstretched Arm Can reach forward >5 cm safely (2")    From Standing Position, Pick up Object from Floor Able to pick up shoe, needs supervision    From Standing Position, Turn to Look Behind Over each Shoulder Turn sideways only but maintains balance    Turn 360 Degrees Needs  assistance while turning   Dizzy following attempt to the left; rest following   Standing Unsupported, Alternately Place Feet on Step/Stool Able to complete >2 steps/needs minimal assist    Standing Unsupported, One Foot in [employer] balance while stepping or standing   pt having difficulty maintaining variations of staggered stance   Standing on One Leg Unable to try or needs assist to prevent fall   Pt afraid to try   Total Score 28    Berg comment: High fall risk            Initiated HEP:  Access Code: [email or web address] URL: [email or web address] Date: 09/02/2021 Prepared by: Camille Bal  Exercises - Seated Hip Abduction with Resistance  - 1 x daily - 5 x weekly - 3 sets - 10 reps - Sit to Stand with Counter Support  - 1 x daily - 5 x weekly - 3 sets - 10 reps - Seated March with Resistance  - 1 x daily - 5 x weekly - 3 sets - 10 reps - Seated Long Arc Quad  - 1 x daily - 5 x weekly - 3 sets - 10 reps   PATIENT EDUCATION: Education details: BERG score and interpretation and initial HEP with supervision from children. Person educated: Patient and Child(ren)-Son Education method: Medical illustrator Education comprehension: verbalized understanding and needs further education     HOME EXERCISE PROGRAM: [email or web address]   GOALS: Goals reviewed with patient? Yes   SHORT TERM GOALS: Target date: 09/13/2021   Pt will be independent with strength and balance HEP with supervision from family. Baseline:  To be established. Goal status: INITIAL   2.  Pt will negotiate curb step x1 supervision level in order to improve safety with entering and exiting home environment. Baseline: To be further assessed. Goal status: INITIAL   3.  Pt will decrease 5xSTS to </=19 seconds in order to demonstrate decreased risk for falls and improved functional bilateral LE strength and power. Baseline: 22.37 sec w/ BUE support Goal status: INITIAL   4.  Pt will demonstrate TUG of  </=25 seconds in order to decrease risk of falls and improve functional mobility using LRAD. Baseline: 29 seconds w/ rollator Goal status: INITIAL   5.  Pt will demonstrate a gait speed of >/= 1.6 feet/sec in order to decrease risk for falls. Baseline: 1.38 ft/sec Goal status: INITIAL   6.  Pt will increase BERG balance score to >/=33/56 to demonstrate improved static balance. Baseline:  28/56 Goal status: INITIAL   LONG TERM GOALS: Target date: 10/11/2021   Pt will be independent with advanced HEP for strength and balance. Baseline: To be established. Goal status: INITIAL   2.  Pt will decrease 5xSTS to </=16 seconds in order to demonstrate decreased risk for falls and improved functional bilateral LE strength and power. Baseline:  22.37 sec w/ BUE support Goal status: INITIAL   3.  Pt will demonstrate TUG of </=20 seconds in order to decrease risk of falls and improve functional mobility using LRAD. Baseline: 29 seconds w/ rollator Goal status: INITIAL   4.  Pt will demonstrate a gait speed of >/=1.9 feet/sec in order to decrease risk for falls. Baseline: 1.38 ft/sec Goal status: INITIAL   5.  Pt will increase BERG balance score to >/= 38/56 to demonstrate improved static balance. Baseline: 28/56 Goal status: INITIAL   ASSESSMENT:   CLINICAL IMPRESSION: Focus of skilled session on assessment of the BERG balance test with the patient scoring a 28/56 indicating a high fall risk.  Initiated HEP focused on seated and functional strengthening on the lower extremities to promote independence with performance.  Pt will need supervision and intermittent cuing for safe engagement to task.  Will continue to progress towards goals as able.   OBJECTIVE IMPAIRMENTS Abnormal gait, decreased activity tolerance, decreased balance, decreased endurance, decreased mobility, difficulty walking, and decreased strength.    ACTIVITY LIMITATIONS carrying, lifting, squatting, stairs, transfers, bed  mobility, and locomotion level   PARTICIPATION LIMITATIONS: cleaning, driving, and community activity   PERSONAL FACTORS Age, Fitness, Past/current experiences, Time since onset of injury/illness/exacerbation, and 1-2 comorbidities: HTN, hyperlipidemia, DM2, polyneuropathy, Stage IV CKD, subcortical CVA  are also affecting patient's functional outcome.    REHAB POTENTIAL: Fair See PMH and personal factors.   CLINICAL DECISION MAKING: Stable/uncomplicated   EVALUATION COMPLEXITY: Low   PLAN: PT FREQUENCY: 1x/week   PT DURATION: 8 weeks   PLANNED INTERVENTIONS: Therapeutic exercises, Therapeutic activity, Neuromuscular re-education, Balance training, Gait training, Patient/Family education, Joint mobilization, Stair training, Vestibular training, DME instructions, Manual therapy, and Re-evaluation   PLAN FOR NEXT SESSION: Modify HEP prn for strength and static balance, work on SLS, narrowed BOS, static balance, backwards walking, cone taps w/ rollator, trunk control in standing   Sadie Haber, PT, DPT 09/02/2021, 1:03 PM

## 2021-09-06 ENCOUNTER — Encounter: Payer: Self-pay | Admitting: Internal Medicine

## 2021-09-09 ENCOUNTER — Encounter: Payer: Self-pay | Admitting: Physical Therapy

## 2021-09-09 ENCOUNTER — Other Ambulatory Visit: Payer: Self-pay

## 2021-09-09 ENCOUNTER — Ambulatory Visit: Payer: Medicare Other | Attending: Physical Medicine & Rehabilitation | Admitting: Physical Therapy

## 2021-09-09 ENCOUNTER — Encounter: Payer: Self-pay | Admitting: Internal Medicine

## 2021-09-09 DIAGNOSIS — R278 Other lack of coordination: Secondary | ICD-10-CM | POA: Diagnosis present

## 2021-09-09 DIAGNOSIS — R2689 Other abnormalities of gait and mobility: Secondary | ICD-10-CM | POA: Diagnosis present

## 2021-09-09 DIAGNOSIS — M6281 Muscle weakness (generalized): Secondary | ICD-10-CM | POA: Insufficient documentation

## 2021-09-09 DIAGNOSIS — R2681 Unsteadiness on feet: Secondary | ICD-10-CM | POA: Diagnosis present

## 2021-09-09 DIAGNOSIS — I69351 Hemiplegia and hemiparesis following cerebral infarction affecting right dominant side: Secondary | ICD-10-CM | POA: Diagnosis present

## 2021-09-09 DIAGNOSIS — R482 Apraxia: Secondary | ICD-10-CM | POA: Diagnosis present

## 2021-09-09 DIAGNOSIS — R41841 Cognitive communication deficit: Secondary | ICD-10-CM | POA: Diagnosis present

## 2021-09-09 DIAGNOSIS — R4701 Aphasia: Secondary | ICD-10-CM | POA: Insufficient documentation

## 2021-09-09 MED ORDER — AMLODIPINE BESYLATE 5 MG PO TABS: 5.0000 mg | ORAL_TABLET | Freq: Every day | ORAL | 2 refills | Status: DC

## 2021-09-09 NOTE — Therapy (Signed)
OUTPATIENT PHYSICAL THERAPY TREATMENT NOTE   Patient Name: Rita Yoder MRN: [id number] DOB:07/06/1940, 81 y.o., female Today's Date: 09/09/2021  PCP: Glendale Chard, MD REFERRING PROVIDER: Meredith Staggers, MD   END OF SESSION:   PT End of Session - 09/09/21 1103     Visit Number 3    Number of Visits 9   8+eval   Date for PT Re-Evaluation 10/25/21   pushed out for scheduling   Authorization Type [employer] MEDICARE    Progress Note Due on Visit 10    PT Start Time 1100    PT Stop Time 1139    PT Time Calculation (min) 39 min    Equipment Utilized During Treatment Gait belt    Activity Tolerance Patient tolerated treatment well    Behavior During Therapy WFL for tasks assessed/performed             Past Medical History:  Diagnosis Date   Chronic kidney disease 12/2009   Diabetes mellitus 2010   Dyslipidemia    Hypertension    Monoclonal gammopathy 03/2010   Past Surgical History:  Procedure Laterality Date   CATARACT EXTRACTION Bilateral 12/2020   CHOLECYSTECTOMY     HERNIA REPAIR     ROTATOR CUFF REPAIR  03/10/1992   THYROID SURGERY     TONSILLECTOMY     TOTAL ABDOMINAL HYSTERECTOMY     Patient Active Problem List   Diagnosis Date Noted   AKI (acute kidney injury) ([place])    Right-sided visual neglect    Hyperglycemia    Subcortical infarction ([place]) 05/02/2021   Stroke ([place]) 04/24/2021   Stroke (cerebrum) ([place]) 04/23/2021   Chronic kidney disease, stage IV (severe) ([place]) 08/26/2020   Weight loss 08/26/2020   Abnormal thyroid blood test 08/26/2020   Left hip pain 08/26/2020   Atherosclerosis of aorta ([place]) 08/20/2020   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   Ecchymoses, spontaneous 06/14/2019   Class 3 severe obesity due to excess calories with serious comorbidity and body mass index (BMI) of 45.0 to 49.9 in adult [facility]) 06/14/2019   Diabetic polyneuropathy associated with type 2 diabetes mellitus ([place]) 01/06/2019   BMI 40.0-44.9, adult ([place])  01/06/2019   Essential hypertension 11/25/2018   Mixed hyperlipidemia 11/25/2018   Precordial pain 11/25/2018   CKD (chronic kidney disease), symptom management only, stage 3 (moderate) ([place]) 04/20/2013    ONSET DATE: 04/23/2021 (CVA)   REFERRING DIAG: I63.9 (ICD-10-CM) - Subcortical infarction ([place])   THERAPY DIAG:  Other abnormalities of gait and mobility  Muscle weakness (generalized)  Unsteadiness on feet  Rationale for Evaluation and Treatment Rehabilitation  PERTINENT HISTORY: HTN, hyperlipidemia, DM2, polyneuropathy, Stage IV CKD, subcortical CVA  PRECAUTIONS: Fall  SUBJECTIVE: Son states noted improvement in mobility at home and that pt is doing exercises with daughter.  PAIN:  Are you having pain? No   OBJECTIVE: (objective measures completed at initial evaluation unless otherwise dated)   TODAY'S TREATMENT:  Verbally reviewed HEP.  Remains challenged and compliant to exercise.  CURB:  Level of Assistance: CGA and Min A Assistive device utilized: Environmental consultant - 4 wheeled Curb Comments: Attempted curb step in gym, pt unable to clear either LE to step up w/ minA to manage rollator onto step w/ improved independence managing rollator when instructed for sequencing of using wheelie to manage rollator onto and off step.  Son states curb step at home is closer to 2" slab of concrete and that pt is independent in management most of the time, requiring some assistance  with rollator management onto and off slab at home.  Asessed 5xSTS w/ BUE support: -attempt 1:  23.50 sec -attempt 2:  18.41 sec   10MWT w/ rollator:  14.25 sec = 0.70 m/sec OR 2.32 ft/sec    Assessed TUG w/ rollator:  27.09 sec   Assessed BERG:  [facility] PT Assessment - 09/09/21 1127       Berg Balance Test   Sit to Stand Able to stand  independently using hands    Standing Unsupported Able to stand 2 minutes with supervision    Sitting with Back Unsupported but Feet Supported on Floor or Stool Able to sit  safely and securely 2 minutes    Stand to Sit Controls descent by using hands    Transfers Able to transfer safely, definite need of hands    Standing Unsupported with Eyes Closed Able to stand 10 seconds safely    Standing Unsupported with Feet Together Needs help to attain position but able to stand for 30 seconds with feet together    From Standing, Reach Forward with Outstretched Arm Can reach forward >5 cm safely (2")    From Standing Position, Pick up Object from Floor Able to pick up shoe safely and easily    From Standing Position, Turn to Look Behind Over each Shoulder Looks behind from both sides and weight shifts well    Turn 360 Degrees Able to turn 360 degrees safely but slowly    Standing Unsupported, Alternately Place Feet on Step/Stool Able to complete >2 steps/needs minimal assist    Standing Unsupported, One Foot in [employer] balance while stepping or standing    Standing on One Leg Unable to try or needs assist to prevent fall   pt verbalizes fear   Total Score 34    Berg comment: Significant Fall Risk            PATIENT EDUCATION: Education details: Progress towards goals and to continue HEP. Person educated: Patient and Child(ren)-Son Education method: Customer service manager Education comprehension: verbalized understanding and needs further education     HOME EXERCISE PROGRAM: Access Code:  [email or web address]  URL: [email or web address] Date: 09/02/2021 Prepared by: Elease Etienne  Exercises - Seated Hip Abduction with Resistance  - 1 x daily - 5 x weekly - 3 sets - 10 reps - Sit to Stand with Counter Support  - 1 x daily - 5 x weekly - 3 sets - 10 reps - Seated March with Resistance  - 1 x daily - 5 x weekly - 3 sets - 10 reps - Seated Long Arc Quad  - 1 x daily - 5 x weekly - 3 sets - 10 reps GOALS: Goals reviewed with patient? Yes   SHORT TERM GOALS: Target date: 09/13/2021   Pt will be independent with strength and balance HEP with  supervision from family. Baseline:  Established and compliant per son, needs advancements. Goal status: MET   2.  Pt will negotiate curb step x1 supervision level in order to improve safety with entering and exiting home environment. Baseline: CGA-minA for rollator management, unable to step onto 5" curb step. Goal status: NOT MET   3.  Pt will decrease 5xSTS to </=19 seconds in order to demonstrate decreased risk for falls and improved functional bilateral LE strength and power. Baseline: 22.37 sec w/ BUE support; 09/09/2021 18.41 sec w/ BUE support Goal status: MET   4.  Pt will demonstrate TUG of </=25 seconds in order to decrease risk of falls  and improve functional mobility using LRAD. Baseline: 29 seconds w/ rollator; 09/09/2021 27.09 sec w/ rollator Goal status: PARTIALLY MET   5.  Pt will demonstrate a gait speed of >/= 1.6 feet/sec in order to decrease risk for falls. Baseline: 1.38 ft/sec; 2.32 ft/sec w/ rollator Goal status: MET   6.  Pt will increase BERG balance score to >/=33/56 to demonstrate improved static balance. Baseline:  28/56; 09/09/2021 34/56 Goal status: MET   LONG TERM GOALS: Target date: 10/11/2021   Pt will be independent with advanced HEP for strength and balance. Baseline: To be established. Goal status: INITIAL   2.  Pt will decrease 5xSTS to </=16 seconds in order to demonstrate decreased risk for falls and improved functional bilateral LE strength and power. Baseline: 22.37 sec w/ BUE support Goal status: INITIAL   3.  Pt will demonstrate TUG of </=20 seconds in order to decrease risk of falls and improve functional mobility using LRAD. Baseline: 29 seconds w/ rollator Goal status: INITIAL   4.  Pt will demonstrate a gait speed of >/=2.5 feet/sec in order to decrease risk for falls. Baseline: 1.38 ft/sec Goal status: REVISED   5.  Pt will increase BERG balance score to >/= 38/56 to demonstrate improved static balance. Baseline: 28/56 Goal status:  INITIAL   ASSESSMENT:   CLINICAL IMPRESSION: Assessed short term goals today with pt demonstrating good progress despite having one treatment session between goal assessments.  She demonstrates improved gait speed to 2.32 ft/sec and improved BERG score of 34/56.  She was unable to perform curb step as she has difficulty performing the necessary amount of hip flexion to perform the height of curb step here vs home.  Her TUG score improved to just shy of goal level at 27.09 seconds with use of rollator.  She performs STS in 18.41 seconds w/ BUE support meeting goal level.  She is at a functional level where she would benefit from standing modifications to her HEP at next session.  Will continue to progress towards LTGs as able with skilled PT intervention.   OBJECTIVE IMPAIRMENTS Abnormal gait, decreased activity tolerance, decreased balance, decreased endurance, decreased mobility, difficulty walking, and decreased strength.    ACTIVITY LIMITATIONS carrying, lifting, squatting, stairs, transfers, bed mobility, and locomotion level   PARTICIPATION LIMITATIONS: cleaning, driving, and community activity   PERSONAL FACTORS Age, Fitness, Past/current experiences, Time since onset of injury/illness/exacerbation, and 1-2 comorbidities: HTN, hyperlipidemia, DM2, polyneuropathy, Stage IV CKD, subcortical CVA  are also affecting patient's functional outcome.    REHAB POTENTIAL: Fair See PMH and personal factors.   CLINICAL DECISION MAKING: Stable/uncomplicated   EVALUATION COMPLEXITY: Low   PLAN: PT FREQUENCY: 1x/week   PT DURATION: 8 weeks   PLANNED INTERVENTIONS: Therapeutic exercises, Therapeutic activity, Neuromuscular re-education, Balance training, Gait training, Patient/Family education, Joint mobilization, Stair training, Vestibular training, DME instructions, Manual therapy, and Re-evaluation   PLAN FOR NEXT SESSION: Modify HEP for strength and static balance-standing march, static balance  in corner, SLS at counter and initial walking program with family supervision; work on SLS, narrowed BOS, static balance, backwards walking, cone taps w/ rollator, trunk control in standing   Bary Richard, PT, DPT 09/09/2021, 11:47 AM

## 2021-09-11 ENCOUNTER — Other Ambulatory Visit: Payer: Self-pay

## 2021-09-11 MED ORDER — LEVOTHYROXINE SODIUM 50 MCG PO TABS: 50.0000 ug | ORAL_TABLET | Freq: Every day | ORAL | 1 refills | Status: DC

## 2021-09-12 ENCOUNTER — Ambulatory Visit: Payer: Medicare Other

## 2021-09-12 ENCOUNTER — Other Ambulatory Visit: Payer: Self-pay

## 2021-09-12 ENCOUNTER — Encounter: Payer: Medicare Other | Admitting: Occupational Therapy

## 2021-09-12 MED ORDER — LEVOTHYROXINE SODIUM 50 MCG PO TABS
50.0000 ug | ORAL_TABLET | Freq: Every day | ORAL | 1 refills | Status: DC
Start: 2021-09-12 — End: 2021-12-26

## 2021-09-16 ENCOUNTER — Ambulatory Visit: Payer: Medicare Other

## 2021-09-16 ENCOUNTER — Encounter: Payer: Medicare Other | Admitting: Occupational Therapy

## 2021-09-16 ENCOUNTER — Encounter: Payer: Self-pay | Admitting: Physical Therapy

## 2021-09-16 ENCOUNTER — Ambulatory Visit: Payer: Medicare Other | Admitting: Physical Therapy

## 2021-09-16 DIAGNOSIS — M6281 Muscle weakness (generalized): Secondary | ICD-10-CM

## 2021-09-16 DIAGNOSIS — R2689 Other abnormalities of gait and mobility: Secondary | ICD-10-CM | POA: Diagnosis not present

## 2021-09-16 DIAGNOSIS — R4701 Aphasia: Secondary | ICD-10-CM

## 2021-09-16 DIAGNOSIS — R41841 Cognitive communication deficit: Secondary | ICD-10-CM

## 2021-09-16 DIAGNOSIS — R482 Apraxia: Secondary | ICD-10-CM

## 2021-09-16 DIAGNOSIS — R2681 Unsteadiness on feet: Secondary | ICD-10-CM

## 2021-09-16 NOTE — Therapy (Signed)
OUTPATIENT SPEECH LANGUAGE PATHOLOGY TREATMENT NOTE   Patient Name: Rita Yoder MRN: [id number] DOB:04/06/1940, 81 y.o., female Today's Date: 09/16/2021   PCP: Cyndee Brightly MD  REFERRING PROVIDER: Meredith Staggers, MD    END OF SESSION:   End of Session - 09/16/21 1537     Visit Number 3    Number of Visits 25    Date for SLP Re-Evaluation 11/08/21    Authorization Type UHC Medicare    SLP Start Time 1537   pt arrived late   SLP Stop Time  1615    SLP Time Calculation (min) 38 min    Activity Tolerance Patient tolerated treatment well             Past Medical History:  Diagnosis Date   Chronic kidney disease 12/2009   Diabetes mellitus 2010   Dyslipidemia    Hypertension    Monoclonal gammopathy 03/2010   Past Surgical History:  Procedure Laterality Date   CATARACT EXTRACTION Bilateral 12/2020   CHOLECYSTECTOMY     HERNIA REPAIR     ROTATOR CUFF REPAIR  03/10/1992   THYROID SURGERY     TONSILLECTOMY     TOTAL ABDOMINAL HYSTERECTOMY     Patient Active Problem List   Diagnosis Date Noted   AKI (acute kidney injury) ([place])    Right-sided visual neglect    Hyperglycemia    Subcortical infarction ([place]) 05/02/2021   Stroke ([place]) 04/24/2021   Stroke (cerebrum) ([place]) 04/23/2021   Chronic kidney disease, stage IV (severe) ([place]) 08/26/2020   Weight loss 08/26/2020   Abnormal thyroid blood test 08/26/2020   Left hip pain 08/26/2020   Atherosclerosis of aorta ([place]) 08/20/2020   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   Ecchymoses, spontaneous 06/14/2019   Class 3 severe obesity due to excess calories with serious comorbidity and body mass index (BMI) of 45.0 to 49.9 in adult ([place]) 06/14/2019   Diabetic polyneuropathy associated with type 2 diabetes mellitus ([place]) 01/06/2019   BMI 40.0-44.9, adult ([place]) 01/06/2019   Essential hypertension 11/25/2018   Mixed hyperlipidemia 11/25/2018   Precordial pain 11/25/2018   CKD (chronic kidney disease), symptom  management only, stage 3 (moderate) ([place]) 04/20/2013    ONSET DATE: 05/02/2021     REFERRING DIAG: I63.9 (ICD-10-CM) - Subcortical infarction     THERAPY DIAG:  Aphasia  Verbal apraxia  Cognitive communication deficit  Rationale for Evaluation and Treatment Rehabilitation  SUBJECTIVE: "Kind of slow"  PAIN:  Are you having pain? No   OBJECTIVE:   TODAY'S TREATMENT: 09-16-21: Reviewed targeted goals given delay since last ST intervention. In response to changes in apraxia, aphasia, and cognition, pt stated "sometimes good, sometimes bad." Pt unable to provide specific or functional examples independently. Daughter reported pt did recently organize pantry, in which SLP targeted generating pantry items. Pt generated items x5 with usual extended time. Daughter provided additional items, in which SLP educated and demonstrated how to implement cues versus providing targeted words to aid lexical connections. SLP trialed writing with usual perseverations exhibited (ex: TTTTT for name) with no awareness noted. Copying and dictation ability also impaired at word level when trialed. SLP highlighted errors to foster increased error recognition.   6/26/23[name] enters room recalling safe standing and sitting for walker. 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 She was unable to name her shoe store or beauty shop, however with questioning cues, she generated location descriptions of  these shops. In simple conversation, Rita Yoder ID'd apraxic errors with occasional min A, with cues to reduce rate to compensate, Rita Yoder self corrected apraxic errors.   08-12-21: Educated patient on evaluation results and clinical observations. Recommended speech therapy intervention to address aphasia, apraxia, and cognitive linguistic deficits. Pt and her son verbalized understanding and agreement.      PATIENT EDUCATION: Education  details: see above Person educated: Patient and Child(ren) Education method: Explanation, Demonstration, and Handout Education comprehension: verbalized understanding, returned demonstration, and needs further education         GOALS: Goals reviewed with patient? Yes   SHORT TERM GOALS: Target date: 09/13/2021 (only seen 2 tx visits since eval)   Pt will verbalize 5-10 items in personally relevant categories with occasional mod A over 2 sessions  Baseline: 09-16-21 Goal status: Partially met   2.  Pt will utilize anomia compensations and multimodal communication to augment verbal expression as needed on structured tasks with occasional mod A over 2 sessions Baseline:  Goal status: Not met   3.  Pt will ID and attempt to correct apraxic/articulation errors on structured tasks given occasional mod A over 2 sessions Baseline: 09-02-21 Goal status: Partially met   4.  Pt will demonstrate ability to recall personally relevant information with use of external aids as needed given occasional mod A over 2 sessions Baseline:  Goal status: Not met      LONG TERM GOALS: Target date: 11/08/2021     Pt will utilize anomia compensations and multimodal communication to augment verbal expression in simple conversation with occasional min A over 2 sessions Baseline:  Goal status: ONGOING   2.  Pt will ID and attempt to correct apraxic/articulation errors in simple conversation given occasional mod A over 2 sessions Baseline:  Goal status: ONGOING   3.  Pt will demonstrate ability to recall personally relevant information with use of external aids as needed given occasional min A over 2 sessions Baseline:  Goal status: ONGOING     ASSESSMENT:   CLINICAL IMPRESSION: Patient is a 81 y.o. female who was seen today for cognitive communication changes post stroke in February 2023. Reported ongoing difficulty with word retrieval and changes in memory, which is an "adjustment and aggravating." Pt  unable to provide specific examples independently. Conducted education and training of word retrieval strategies and apraxia strategies to aid communication effectiveness. Skilled ST intervention is warranted to address verbal apraxia, aphasia, and cognitive linguistic changes to maximize return to baseline and optimize QOL.   OBJECTIVE IMPAIRMENTS include attention, memory, executive functioning, aphasia, and apraxia. These impairments are limiting patient from household responsibilities, ADLs/IADLs, and effectively communicating at home and in community. Factors affecting potential to achieve goals and functional outcome are ability to learn/carryover information. Patient will benefit from skilled SLP services to address above impairments and improve overall function.   REHAB POTENTIAL: Good   PLAN: SLP FREQUENCY: 2x/week (pt may elect for 1x/week due to transportation)   SLP DURATION: 12 weeks   PLANNED INTERVENTIONS: Language facilitation, Environmental controls, Trials of upgraded texture/liquids, Cueing hierachy, Cognitive reorganization, Internal/external aids, Functional tasks, Multimodal communication approach, SLP instruction and feedback, Compensatory strategies, and Patient/family education   Rita Yoder, [place] 09/16/2021, 4:28 PM

## 2021-09-16 NOTE — Patient Instructions (Signed)
Cueing Hierarchy:  Description - define the word or provide the function First letter - provide the first letter of the target word Spell - spell the target word, orally Phrase Completion - give a phrase to complete First sound - provide the first sound of the target word Model - say the word aloud to repeat   Example target: "mug" The thing you put coffee in Starts with m [place] my coffee _____ "Muh" Mug

## 2021-09-16 NOTE — Therapy (Signed)
OUTPATIENT PHYSICAL THERAPY TREATMENT NOTE   Patient Name: Rita Yoder MRN: [id number] DOB:1940/06/11, 81 y.o., female Today's Date: 09/16/2021  PCP: Glendale Chard, MD REFERRING PROVIDER: Meredith Staggers, MD   END OF SESSION:   PT End of Session - 09/16/21 1620     Visit Number 4    Number of Visits 9   8+eval   Date for PT Re-Evaluation 10/25/21   pushed out for scheduling   Authorization Type [employer] MEDICARE    Progress Note Due on Visit 10    PT Start Time 1619    PT Stop Time 8675    PT Time Calculation (min) 39 min    Equipment Utilized During Treatment Gait belt    Activity Tolerance Patient tolerated treatment well    Behavior During Therapy WFL for tasks assessed/performed             Past Medical History:  Diagnosis Date   Chronic kidney disease 12/2009   Diabetes mellitus 2010   Dyslipidemia    Hypertension    Monoclonal gammopathy 03/2010   Past Surgical History:  Procedure Laterality Date   CATARACT EXTRACTION Bilateral 12/2020   CHOLECYSTECTOMY     HERNIA REPAIR     ROTATOR CUFF REPAIR  03/10/1992   THYROID SURGERY     TONSILLECTOMY     TOTAL ABDOMINAL HYSTERECTOMY     Patient Active Problem List   Diagnosis Date Noted   AKI (acute kidney injury) ([place])    Right-sided visual neglect    Hyperglycemia    Subcortical infarction ([place]) 05/02/2021   Stroke ([place]) 04/24/2021   Stroke (cerebrum) ([place]) 04/23/2021   Chronic kidney disease, stage IV (severe) ([place]) 08/26/2020   Weight loss 08/26/2020   Abnormal thyroid blood test 08/26/2020   Left hip pain 08/26/2020   Atherosclerosis of aorta ([place]) 08/20/2020   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   Ecchymoses, spontaneous 06/14/2019   Class 3 severe obesity due to excess calories with serious comorbidity and body mass index (BMI) of 45.0 to 49.9 in adult [facility]) 06/14/2019   Diabetic polyneuropathy associated with type 2 diabetes mellitus ([place]) 01/06/2019   BMI 40.0-44.9, adult ([place])  01/06/2019   Essential hypertension 11/25/2018   Mixed hyperlipidemia 11/25/2018   Precordial pain 11/25/2018   CKD (chronic kidney disease), symptom management only, stage 3 (moderate) ([place]) 04/20/2013    ONSET DATE: 04/23/2021 (CVA)   REFERRING DIAG: I63.9 (ICD-10-CM) - Subcortical infarction ([employer])   THERAPY DIAG:  Other abnormalities of gait and mobility  Muscle weakness (generalized)  Unsteadiness on feet  Rationale for Evaluation and Treatment Rehabilitation  PERTINENT HISTORY: HTN, hyperlipidemia, DM2, polyneuropathy, Stage IV CKD, subcortical CVA  PRECAUTIONS: Fall  SUBJECTIVE: Patient denies falls at home.  States speech went a little better today, but not much.  She states nothing has really changed.  She states her compliance to HEP is "so-so".  PAIN:  Are you having pain? No   OBJECTIVE: (objective measures completed at initial evaluation unless otherwise dated)   TODAY'S TREATMENT:  -Seated hip abduction w/ green theraband 2x12 -STS w/ hands on knees x10 -Seated march w/ green theraband x10 > standing march 2x20 at countertop -Seated LAQ w/o resistance x10 alt LE > x10 w/ green theraband at ankles -SLS w/ UE support on counter 3x5 sec each LE -Tandem stance w/ BUE support on counter 2x15 seconds each LE w/ inc one step cuing, demo and minA for placement of LE See bolded below for additions and modifications to HEP. -  Standing cone taps w/ rollator navigation, pt demos difficulty with motor planning and control with tapping cones losing proximity to rollator during SLS. -Standing in corner feet together eyes closed x5 sec > 2x10 sec, pt too hesitant to safely perform at home stating she would not do this at home anyway so not added to HEP.  PATIENT EDUCATION: Education details: Additions and modifications to HEP with compliance as able.  Explanation of walking program to build upon current pt activity. Person educated: Patient and Child(ren)-Son Education  method: Customer service manager Education comprehension: verbalized understanding and needs further education     HOME EXERCISE PROGRAM: Access Code: [email or web address] URL: [email or web address] Date: 09/16/2021 Prepared by: Elease Etienne  Exercises - Seated Hip Abduction with Resistance  - 1 x daily - 5 x weekly - 3 sets - 10 reps - Sit to Stand with Hands on Knees  - 1 x daily - 5 x weekly - 2 sets - 10 reps - Standing March with Counter Support  - 1 x daily - 5 x weekly - 2 sets - 20 reps - Seated Knee Extension with Resistance  - 1 x daily - 5 x weekly - 2 sets - 10 reps - Standing Single Leg Stance with Counter Support  - 1 x daily - 5 x weekly - 1 sets - 3 reps - 5-10 seconds hold - Standing Tandem Balance with Counter Support  - 1 x daily - 5 x weekly - 1 sets - 3 reps - 15 seconds hold  You Can Walk For A Certain Length Of Time Each Day                          Walk 10 minutes 1-3 times per day.             Increase 1-2  minutes every 7 days              Work up to 20 minutes (1 times per day).   GOALS: Goals reviewed with patient? Yes   SHORT TERM GOALS: Target date: 09/13/2021   Pt will be independent with strength and balance HEP with supervision from family. Baseline:  Established and compliant per son, needs advancements. Goal status: MET   2.  Pt will negotiate curb step x1 supervision level in order to improve safety with entering and exiting home environment. Baseline: CGA-minA for rollator management, unable to step onto 5" curb step. Goal status: NOT MET   3.  Pt will decrease 5xSTS to </=19 seconds in order to demonstrate decreased risk for falls and improved functional bilateral LE strength and power. Baseline: 22.37 sec w/ BUE support; 09/09/2021 18.41 sec w/ BUE support Goal status: MET   4.  Pt will demonstrate TUG of </=25 seconds in order to decrease risk of falls and improve functional mobility using LRAD. Baseline: 29 seconds w/  rollator; 09/09/2021 27.09 sec w/ rollator Goal status: PARTIALLY MET   5.  Pt will demonstrate a gait speed of >/= 1.6 feet/sec in order to decrease risk for falls. Baseline: 1.38 ft/sec; 2.32 ft/sec w/ rollator Goal status: MET   6.  Pt will increase BERG balance score to >/=33/56 to demonstrate improved static balance. Baseline:  28/56; 09/09/2021 34/56 Goal status: MET   LONG TERM GOALS: Target date: 10/11/2021   Pt will be independent with advanced HEP for strength and balance. Baseline: To be established. Goal status: INITIAL   2.  Pt will decrease 5xSTS  to </=16 seconds in order to demonstrate decreased risk for falls and improved functional bilateral LE strength and power. Baseline: 22.37 sec w/ BUE support Goal status: INITIAL   3.  Pt will demonstrate TUG of </=20 seconds in order to decrease risk of falls and improve functional mobility using LRAD. Baseline: 29 seconds w/ rollator Goal status: INITIAL   4.  Pt will demonstrate a gait speed of >/=2.5 feet/sec in order to decrease risk for falls. Baseline: 1.38 ft/sec Goal status: REVISED   5.  Pt will increase BERG balance score to >/= 38/56 to demonstrate improved static balance. Baseline: 28/56 Goal status: INITIAL   ASSESSMENT:   CLINICAL IMPRESSION: Modifications made to HEP today to challenge standing tolerance and static balance.  Pt very hesitant to perform static balance without visualization of environment stating this issue comes from her hip discomfort not her balance.  Will continue to address deficits per POC in coming visits and progress towards LTGs as able.   OBJECTIVE IMPAIRMENTS Abnormal gait, decreased activity tolerance, decreased balance, decreased endurance, decreased mobility, difficulty walking, and decreased strength.    ACTIVITY LIMITATIONS carrying, lifting, squatting, stairs, transfers, bed mobility, and locomotion level   PARTICIPATION LIMITATIONS: cleaning, driving, and community activity    PERSONAL FACTORS Age, Fitness, Past/current experiences, Time since onset of injury/illness/exacerbation, and 1-2 comorbidities: HTN, hyperlipidemia, DM2, polyneuropathy, Stage IV CKD, subcortical CVA  are also affecting patient's functional outcome.    REHAB POTENTIAL: Fair See PMH and personal factors.   CLINICAL DECISION MAKING: Stable/uncomplicated   EVALUATION COMPLEXITY: Low   PLAN: PT FREQUENCY: 1x/week   PT DURATION: 8 weeks   PLANNED INTERVENTIONS: Therapeutic exercises, Therapeutic activity, Neuromuscular re-education, Balance training, Gait training, Patient/Family education, Joint mobilization, Stair training, Vestibular training, DME instructions, Manual therapy, and Re-evaluation   PLAN FOR NEXT SESSION: Modify HEP for dynamic balance-work on backwards walking at countertop, try tandem walking at counter, work on SLS, narrowed BOS, static balance, backwards walking, cone taps w/ rollator, trunk control in standing, continue to address curb step w/ and w/o rollator   Bary Richard, PT, DPT 09/16/2021, 5:01 PM

## 2021-09-19 ENCOUNTER — Ambulatory Visit: Payer: Medicare Other

## 2021-09-19 ENCOUNTER — Encounter: Payer: Medicare Other | Admitting: Occupational Therapy

## 2021-09-23 ENCOUNTER — Ambulatory Visit: Payer: Medicare Other | Admitting: Physical Therapy

## 2021-09-23 ENCOUNTER — Ambulatory Visit: Payer: Medicare Other

## 2021-09-23 ENCOUNTER — Encounter: Payer: Self-pay | Admitting: Physical Therapy

## 2021-09-23 ENCOUNTER — Encounter: Payer: Medicare Other | Admitting: Occupational Therapy

## 2021-09-23 DIAGNOSIS — R2689 Other abnormalities of gait and mobility: Secondary | ICD-10-CM | POA: Diagnosis not present

## 2021-09-23 DIAGNOSIS — I69351 Hemiplegia and hemiparesis following cerebral infarction affecting right dominant side: Secondary | ICD-10-CM

## 2021-09-23 DIAGNOSIS — R41841 Cognitive communication deficit: Secondary | ICD-10-CM

## 2021-09-23 DIAGNOSIS — R482 Apraxia: Secondary | ICD-10-CM

## 2021-09-23 DIAGNOSIS — R4701 Aphasia: Secondary | ICD-10-CM

## 2021-09-23 DIAGNOSIS — R2681 Unsteadiness on feet: Secondary | ICD-10-CM

## 2021-09-23 DIAGNOSIS — M6281 Muscle weakness (generalized): Secondary | ICD-10-CM

## 2021-09-23 NOTE — Patient Instructions (Addendum)
Speaking practice:  Say it slowly - this helps your mind and mouth sync together  Take a deep breath and try to say it in a different way, especially if you get frustrated  Practicing reading - try a magnify glass to see if that helps the vision    Write out steps to work the television (use bright colors or tape on remote to help with vision component)          Write out family member's birthdays

## 2021-09-23 NOTE — Therapy (Signed)
OUTPATIENT SPEECH LANGUAGE PATHOLOGY TREATMENT NOTE   Patient Name: Rita Yoder MRN: [id number] DOB:Apr 24, 1940, 81 y.o., female Today's Date: 09/23/2021   PCP: Cyndee Brightly MD  REFERRING PROVIDER: Meredith Staggers, MD    END OF SESSION:   End of Session - 09/23/21 1730     Visit Number 4    Number of Visits 25    Date for SLP Re-Evaluation 11/08/21    Authorization Type [facility] Medicare    SLP Start Time 34    SLP Stop Time  1610    SLP Time Calculation (min) 40 min    Activity Tolerance Patient tolerated treatment well;Patient limited by fatigue              Past Medical History:  Diagnosis Date   Chronic kidney disease 12/2009   Diabetes mellitus 2010   Dyslipidemia    Hypertension    Monoclonal gammopathy 03/2010   Past Surgical History:  Procedure Laterality Date   CATARACT EXTRACTION Bilateral 12/2020   CHOLECYSTECTOMY     HERNIA REPAIR     ROTATOR CUFF REPAIR  03/10/1992   THYROID SURGERY     TONSILLECTOMY     TOTAL ABDOMINAL HYSTERECTOMY     Patient Active Problem List   Diagnosis Date Noted   AKI (acute kidney injury) ([place])    Right-sided visual neglect    Hyperglycemia    Subcortical infarction ([place]) 05/02/2021   Stroke ([place]) 04/24/2021   Stroke (cerebrum) ([place]) 04/23/2021   Chronic kidney disease, stage IV (severe) ([place]) 08/26/2020   Weight loss 08/26/2020   Abnormal thyroid blood test 08/26/2020   Left hip pain 08/26/2020   Atherosclerosis of aorta ([place]) 08/20/2020   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   Ecchymoses, spontaneous 06/14/2019   Class 3 severe obesity due to excess calories with serious comorbidity and body mass index (BMI) of 45.0 to 49.9 in adult ([place]) 06/14/2019   Diabetic polyneuropathy associated with type 2 diabetes mellitus ([place]) 01/06/2019   BMI 40.0-44.9, adult ([place]) 01/06/2019   Essential hypertension 11/25/2018   Mixed hyperlipidemia 11/25/2018   Precordial pain 11/25/2018   CKD (chronic kidney  disease), symptom management only, stage 3 (moderate) ([place]) 04/20/2013    ONSET DATE: 05/02/2021     REFERRING DIAG: I63.9 (ICD-10-CM) - Subcortical infarction     THERAPY DIAG:  Verbal apraxia  Aphasia  Cognitive communication deficit  Rationale for Evaluation and Treatment Rehabilitation  SUBJECTIVE: "been the same"  PAIN:  Are you having pain? No  OBJECTIVE:   TODAY'S TREATMENT: 09-23-21: Difficulty relaying recent events and daily schedule exhibited related to anomia and verbal apraxia. Pt's granddaughter present and provided appropriate cues to aid word finding and recall with SLP providing positive affirmation of correct cueing techniques. SLP educated and instructed speech compensations to assist with apraxia, including utilizing slow rate and rephrasing as needed. Recommended practice reading at sentence level at home (may need modifications due to vision). Discussed ways to engage patient at home to reduce patient frustration and facilitate increased independence. Educated use of written external aids to aid recall of personally relevant information. Pt decided fridge would be ideal place to locate external memory aids.   09-16-21: Reviewed targeted goals given delay since last ST intervention. In response to changes in apraxia, aphasia, and cognition, pt stated "sometimes good, sometimes bad." Pt unable to provide specific or functional examples independently. Daughter reported pt did recently organize pantry, in which SLP targeted generating pantry items. Pt generated items x5 with usual extended time.  Daughter provided additional items, in which SLP educated and demonstrated how to implement cues versus providing targeted words to aid lexical connections. SLP trialed writing with usual perseverations exhibited (ex: TTTTT for name) with no awareness noted. Copying and dictation ability also impaired at word level when trialed. SLP highlighted errors to foster increased error  recognition.   6/26/23Inez Catalina enters room recalling safe standing and sitting for walker. 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 She was unable to name her shoe store or beauty shop, however with questioning cues, she generated location descriptions of these shops. In simple conversation, Lynsay ID'd apraxic errors with occasional min A, with cues to reduce rate to compensate, Amoni self corrected apraxic errors.   08-12-21: Educated patient on evaluation results and clinical observations. Recommended speech therapy intervention to address aphasia, apraxia, and cognitive linguistic deficits. Pt and her son verbalized understanding and agreement.      PATIENT EDUCATION: Education details: see above Person educated: Patient and Child(ren) Education method: Explanation, Demonstration, and Handout Education comprehension: verbalized understanding, returned demonstration, and needs further education         GOALS: Goals reviewed with patient? Yes   SHORT TERM GOALS: Target date: 09/13/2021 (only seen 2 tx visits since eval)   Pt will verbalize 5-10 items in personally relevant categories with occasional mod A over 2 sessions  Baseline: 09-16-21 Goal status: Partially met   2.  Pt will utilize anomia compensations and multimodal communication to augment verbal expression as needed on structured tasks with occasional mod A over 2 sessions Baseline:  Goal status: Not met   3.  Pt will ID and attempt to correct apraxic/articulation errors on structured tasks given occasional mod A over 2 sessions Baseline: 09-02-21 Goal status: Partially met   4.  Pt will demonstrate ability to recall personally relevant information with use of external aids as needed given occasional mod A over 2 sessions Baseline:  Goal status: Not met      LONG TERM GOALS: Target date: 11/08/2021     Pt will utilize anomia compensations  and multimodal communication to augment verbal expression in simple conversation with occasional min A over 2 sessions Baseline:  Goal status: ONGOING   2.  Pt will ID and attempt to correct apraxic/articulation errors in simple conversation given occasional mod A over 2 sessions Baseline:  Goal status: ONGOING   3.  Pt will demonstrate ability to recall personally relevant information with use of external aids as needed given occasional min A over 2 sessions Baseline:  Goal status: ONGOING     ASSESSMENT:   CLINICAL IMPRESSION: Patient is a 81 y.o. female who was seen today for cognitive communication changes post stroke in February 2023. Reported ongoing difficulty with word retrieval and changes in memory, which is an "adjustment and aggravating." Pt unable to provide specific examples independently. Conducted education and training of word retrieval strategies and apraxia strategies to aid communication effectiveness as well as cognitive strategies to aid recall of functional information. Skilled ST intervention is warranted to address verbal apraxia, aphasia, and cognitive linguistic changes to maximize return to baseline and optimize QOL.   OBJECTIVE IMPAIRMENTS include attention, memory, executive functioning, aphasia, and apraxia. These impairments are limiting patient from household responsibilities, ADLs/IADLs, and effectively communicating at home and in community. Factors affecting potential to achieve goals and functional outcome are ability to learn/carryover information. Patient will benefit from skilled SLP  services to address above impairments and improve overall function.   REHAB POTENTIAL: Good   PLAN: SLP FREQUENCY: 2x/week (pt may elect for 1x/week due to transportation)   SLP DURATION: 12 weeks   PLANNED INTERVENTIONS: Language facilitation, Environmental controls, Trials of upgraded texture/liquids, Cueing hierachy, Cognitive reorganization, Internal/external aids,  Functional tasks, Multimodal communication approach, SLP instruction and feedback, Compensatory strategies, and Patient/family education   Marzetta Board, [place] 09/23/2021, 5:30 PM

## 2021-09-23 NOTE — Therapy (Unsigned)
OUTPATIENT PHYSICAL THERAPY TREATMENT NOTE   Patient Name: Rita Yoder MRN: [id number] DOB:04-22-1940, 81 y.o., female Today's Date: 09/24/2021  PCP: Glendale Chard, MD REFERRING PROVIDER: Meredith Staggers, MD   END OF SESSION:   PT End of Session - 09/23/21 1455     Visit Number 5    Number of Visits 9   8+eval   Date for PT Re-Evaluation 10/25/21   pushed out for scheduling   Authorization Type [employer] MEDICARE    Progress Note Due on Visit 10    PT Start Time 6659    PT Stop Time 1530    PT Time Calculation (min) 39 min    Equipment Utilized During Treatment Gait belt    Activity Tolerance Patient tolerated treatment well    Behavior During Therapy WFL for tasks assessed/performed             Past Medical History:  Diagnosis Date   Chronic kidney disease 12/2009   Diabetes mellitus 2010   Dyslipidemia    Hypertension    Monoclonal gammopathy 03/2010   Past Surgical History:  Procedure Laterality Date   CATARACT EXTRACTION Bilateral 12/2020   CHOLECYSTECTOMY     HERNIA REPAIR     ROTATOR CUFF REPAIR  03/10/1992   THYROID SURGERY     TONSILLECTOMY     TOTAL ABDOMINAL HYSTERECTOMY     Patient Active Problem List   Diagnosis Date Noted   AKI (acute kidney injury) ([place])    Right-sided visual neglect    Hyperglycemia    Subcortical infarction ([place]) 05/02/2021   Stroke ([place]) 04/24/2021   Stroke (cerebrum) ([place]) 04/23/2021   Chronic kidney disease, stage IV (severe) ([place]) 08/26/2020   Weight loss 08/26/2020   Abnormal thyroid blood test 08/26/2020   Left hip pain 08/26/2020   Atherosclerosis of aorta ([place]) 08/20/2020   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   Ecchymoses, spontaneous 06/14/2019   Class 3 severe obesity due to excess calories with serious comorbidity and body mass index (BMI) of 45.0 to 49.9 in adult [facility]) 06/14/2019   Diabetic polyneuropathy associated with type 2 diabetes mellitus ([place]) 01/06/2019   BMI 40.0-44.9, adult ([place])  01/06/2019   Essential hypertension 11/25/2018   Mixed hyperlipidemia 11/25/2018   Precordial pain 11/25/2018   CKD (chronic kidney disease), symptom management only, stage 3 (moderate) ([place]) 04/20/2013    ONSET DATE: 04/23/2021 (CVA)   REFERRING DIAG: I63.9 (ICD-10-CM) - Subcortical infarction ([place])   THERAPY DIAG:  Other abnormalities of gait and mobility  Muscle weakness (generalized)  Unsteadiness on feet  Hemiplegia and hemiparesis following cerebral infarction affecting right dominant side (HCC)  Rationale for Evaluation and Treatment Rehabilitation  PERTINENT HISTORY: HTN, hyperlipidemia, DM2, polyneuropathy, Stage IV CKD, subcortical CVA  PRECAUTIONS: Fall  SUBJECTIVE: Patient denies any falls or new changes.  She states her HEP is going "so-so" and she does what she can.  She states she is not walking too much.  PAIN:  Are you having pain? No  OBJECTIVE: (objective measures completed at initial evaluation unless otherwise dated)  TODAY'S TREATMENT:  -backwards walking at countertop following by tandem/semi-tandem walking 8x10'; pt requires seated rest midway through - added to HEP w/ instruction for hand-over-hand assist in addition to unilateral support on countertop  CURB:  Level of Assistance: Modified independence Assistive device utilized: Environmental consultant - 4 wheeled Curb Comments: Pt performs steps ups x10 alt LE to 4" aerobic step w/ BUE on countertop to simulate curb step/entryway concrete slab patient navigates at home.  Pt would benefit from further performance of curb step w/ management of rollator for increased independence, but discussed potential for continued functional assist getting rollator onto curb w/ granddaughter.  -Standing feet apart no UE support 2x51min; pt stands in high guard due to fear of falling -Standing feet together no UE support 2x52min; pt unable to get feet completely touching due to body habitus and fear of narrowed BOS -Feet apart no UE  support EC x15 sec followed by 1x20sec; pt increasingly nervous voicing this exercise is "too much", improved fear with family member standing on opposite side providing encouragement  Pt requires seated rest throughout.  PATIENT EDUCATION: Education details: Additions and modifications to HEP with compliance as able.   Person educated: Patient and Child(ren)-Son Education method: Customer service manager Education comprehension: verbalized understanding and needs further education     HOME EXERCISE PROGRAM: Access Code: [email or web address] URL: [email or web address] Date: 09/16/2021 Prepared by: Elease Etienne  Exercises - Seated Hip Abduction with Resistance  - 1 x daily - 5 x weekly - 3 sets - 10 reps - Sit to Stand with Hands on Knees  - 1 x daily - 5 x weekly - 2 sets - 10 reps - Standing March with Counter Support  - 1 x daily - 5 x weekly - 2 sets - 20 reps - Seated Knee Extension with Resistance  - 1 x daily - 5 x weekly - 2 sets - 10 reps - Standing Single Leg Stance with Counter Support  - 1 x daily - 5 x weekly - 1 sets - 3 reps - 5-10 seconds hold - Standing Tandem Balance with Counter Support  - 1 x daily - 5 x weekly - 1 sets - 3 reps - 15 seconds hold - Tandem Walking with Counter Support  - 1 x daily - 5 x weekly - 3 sets - 10 reps - Backward Walking with Counter Support  - 1 x daily - 5 x weekly - 3 sets - 10 reps  You Can Walk For A Certain Length Of Time Each Day                          Walk 10 minutes 1-3 times per day.             Increase 1-2  minutes every 7 days              Work up to 20 minutes (1 times per day).   GOALS: Goals reviewed with patient? Yes   SHORT TERM GOALS: Target date: 09/13/2021   Pt will be independent with strength and balance HEP with supervision from family. Baseline:  Established and compliant per son, needs advancements. Goal status: MET   2.  Pt will negotiate curb step x1 supervision level in order to improve  safety with entering and exiting home environment. Baseline: CGA-minA for rollator management, unable to step onto 5" curb step. Goal status: NOT MET   3.  Pt will decrease 5xSTS to </=19 seconds in order to demonstrate decreased risk for falls and improved functional bilateral LE strength and power. Baseline: 22.37 sec w/ BUE support; 09/09/2021 18.41 sec w/ BUE support Goal status: MET   4.  Pt will demonstrate TUG of </=25 seconds in order to decrease risk of falls and improve functional mobility using LRAD. Baseline: 29 seconds w/ rollator; 09/09/2021 27.09 sec w/ rollator Goal status: PARTIALLY MET   5.  Pt will demonstrate a gait  speed of >/= 1.6 feet/sec in order to decrease risk for falls. Baseline: 1.38 ft/sec; 2.32 ft/sec w/ rollator Goal status: MET   6.  Pt will increase BERG balance score to >/=33/56 to demonstrate improved static balance. Baseline:  28/56; 09/09/2021 34/56 Goal status: MET   LONG TERM GOALS: Target date: 10/11/2021   Pt will be independent with advanced HEP for strength and balance. Baseline: To be established. Goal status: INITIAL   2.  Pt will decrease 5xSTS to </=16 seconds in order to demonstrate decreased risk for falls and improved functional bilateral LE strength and power. Baseline: 22.37 sec w/ BUE support Goal status: INITIAL   3.  Pt will demonstrate TUG of </=20 seconds in order to decrease risk of falls and improve functional mobility using LRAD. Baseline: 29 seconds w/ rollator Goal status: INITIAL   4.  Pt will demonstrate a gait speed of >/=2.5 feet/sec in order to decrease risk for falls. Baseline: 1.38 ft/sec Goal status: REVISED   5.  Pt will increase BERG balance score to >/= 38/56 to demonstrate improved static balance. Baseline: 28/56 Goal status: INITIAL   ASSESSMENT:   CLINICAL IMPRESSION: Emphasis of skilled session today on static and dynamic balance activities.  Made additions to HEP to progress dynamic balance with  hand-over-hand assist from family members.  Pt continues to demonstrate hesitancy to perform activities with eye closed due to fear of falling, but demonstrates good performance of these activities despite this.  Will continue to address this fear and progress towards LTGs as able.   OBJECTIVE IMPAIRMENTS Abnormal gait, decreased activity tolerance, decreased balance, decreased endurance, decreased mobility, difficulty walking, and decreased strength.    ACTIVITY LIMITATIONS carrying, lifting, squatting, stairs, transfers, bed mobility, and locomotion level   PARTICIPATION LIMITATIONS: cleaning, driving, and community activity   PERSONAL FACTORS Age, Fitness, Past/current experiences, Time since onset of injury/illness/exacerbation, and 1-2 comorbidities: HTN, hyperlipidemia, DM2, polyneuropathy, Stage IV CKD, subcortical CVA  are also affecting patient's functional outcome.    REHAB POTENTIAL: Fair See PMH and personal factors.   CLINICAL DECISION MAKING: Stable/uncomplicated   EVALUATION COMPLEXITY: Low   PLAN: PT FREQUENCY: 1x/week   PT DURATION: 8 weeks   PLANNED INTERVENTIONS: Therapeutic exercises, Therapeutic activity, Neuromuscular re-education, Balance training, Gait training, Patient/Family education, Joint mobilization, Stair training, Vestibular training, DME instructions, Manual therapy, and Re-evaluation   PLAN FOR NEXT SESSION: Modify HEP prn, work on SLS, narrowed BOS, static balance, backwards walking, cone taps w/ rollator, trunk control in standing, continue to address curb step w/ and w/o rollator   Bary Richard, PT, DPT 09/24/2021, 7:01 AM

## 2021-09-26 ENCOUNTER — Encounter: Payer: Medicare Other | Admitting: Occupational Therapy

## 2021-09-30 ENCOUNTER — Ambulatory Visit: Payer: Medicare Other

## 2021-09-30 ENCOUNTER — Ambulatory Visit: Payer: Medicare Other | Admitting: Physical Therapy

## 2021-09-30 ENCOUNTER — Encounter: Payer: Self-pay | Admitting: Physical Therapy

## 2021-09-30 DIAGNOSIS — R2689 Other abnormalities of gait and mobility: Secondary | ICD-10-CM

## 2021-09-30 DIAGNOSIS — M6281 Muscle weakness (generalized): Secondary | ICD-10-CM

## 2021-09-30 DIAGNOSIS — I69351 Hemiplegia and hemiparesis following cerebral infarction affecting right dominant side: Secondary | ICD-10-CM

## 2021-09-30 DIAGNOSIS — R4701 Aphasia: Secondary | ICD-10-CM

## 2021-09-30 DIAGNOSIS — R2681 Unsteadiness on feet: Secondary | ICD-10-CM

## 2021-09-30 DIAGNOSIS — R41841 Cognitive communication deficit: Secondary | ICD-10-CM

## 2021-09-30 DIAGNOSIS — R482 Apraxia: Secondary | ICD-10-CM

## 2021-09-30 NOTE — Therapy (Signed)
OUTPATIENT SPEECH LANGUAGE PATHOLOGY TREATMENT NOTE   Patient Name: Rita Yoder MRN: [id number] DOB:February 27, 1941, 80 y.o., female Today's Date: 09/30/2021   PCP: Rita Brightly MD  REFERRING PROVIDER: Meredith Staggers, MD    END OF SESSION:   End of Session - 09/30/21 1533     Visit Number 5    Number of Visits 25    Date for SLP Re-Evaluation 11/08/21    Authorization Type UHC Medicare    SLP Start Time 1533    SLP Stop Time  1615    SLP Time Calculation (min) 42 min    Activity Tolerance Patient tolerated treatment well               Past Medical History:  Diagnosis Date   Chronic kidney disease 12/2009   Diabetes mellitus 2010   Dyslipidemia    Hypertension    Monoclonal gammopathy 03/2010   Past Surgical History:  Procedure Laterality Date   CATARACT EXTRACTION Bilateral 12/2020   CHOLECYSTECTOMY     HERNIA REPAIR     ROTATOR CUFF REPAIR  03/10/1992   THYROID SURGERY     TONSILLECTOMY     TOTAL ABDOMINAL HYSTERECTOMY     Patient Active Problem List   Diagnosis Date Noted   AKI (acute kidney injury) ([place])    Right-sided visual neglect    Hyperglycemia    Subcortical infarction ([place]) 05/02/2021   Stroke ([place]) 04/24/2021   Stroke (cerebrum) ([place]) 04/23/2021   Chronic kidney disease, stage IV (severe) ([place]) 08/26/2020   Weight loss 08/26/2020   Abnormal thyroid blood test 08/26/2020   Left hip pain 08/26/2020   Atherosclerosis of aorta ([place]) 08/20/2020   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   Ecchymoses, spontaneous 06/14/2019   Class 3 severe obesity due to excess calories with serious comorbidity and body mass index (BMI) of 45.0 to 49.9 in adult ([place]) 06/14/2019   Diabetic polyneuropathy associated with type 2 diabetes mellitus ([place]) 01/06/2019   BMI 40.0-44.9, adult ([place]) 01/06/2019   Essential hypertension 11/25/2018   Mixed hyperlipidemia 11/25/2018   Precordial pain 11/25/2018   CKD (chronic kidney disease), symptom management  only, stage 3 (moderate) ([place]) 04/20/2013    ONSET DATE: 05/02/2021     REFERRING DIAG: I63.9 (ICD-10-CM) - Subcortical infarction     THERAPY DIAG:  Aphasia  Verbal apraxia  Cognitive communication deficit  Rationale for Evaluation and Treatment Rehabilitation  SUBJECTIVE: "I'm trying to get it out"   PAIN:  Are you having pain? No  OBJECTIVE:   TODAY'S TREATMENT: 09-30-21: Did not complete HEP. Difficulty recalling and verbally sequencing how to use television remote exhibited. Targeted verbal sequencing of how to make PB&J sandwich, with usual prompting to ID next appropriate step and usual mod to max A required to name specifics (ex: knife). Sentence completions were most effective to aid anomia, with granddaughter demonstrating appropriate cuing. Targeted picture naming of functional items, in which pt required usual extra processing time with some benefit for independent word retrieval. Gestures and cued descriptions were occasionally effective to aid word finding. Updated HEP to address verbal expression deficits.   09-23-21: Difficulty relaying recent events and daily schedule exhibited related to anomia and verbal apraxia. Pt's granddaughter present and provided appropriate cues to aid word finding and recall with SLP providing positive affirmation of correct cueing techniques. SLP educated and instructed speech compensations to assist with apraxia, including utilizing slow rate and rephrasing as needed. Recommended practice reading at sentence level at home (may need modifications  due to vision). Discussed ways to engage patient at home to reduce patient frustration and facilitate increased independence. Educated use of written external aids to aid recall of personally relevant information. Pt decided fridge would be ideal place to locate external memory aids.   09-16-21: Reviewed targeted goals given delay since last ST intervention. In response to changes in apraxia, aphasia, and  cognition, pt stated "sometimes good, sometimes bad." Pt unable to provide specific or functional examples independently. Daughter reported pt did recently organize pantry, in which SLP targeted generating pantry items. Pt generated items x5 with usual extended time. Daughter provided additional items, in which SLP educated and demonstrated how to implement cues versus providing targeted words to aid lexical connections. SLP trialed writing with usual perseverations exhibited (ex: TTTTT for name) with no awareness noted. Copying and dictation ability also impaired at word level when trialed. SLP highlighted errors to foster increased error recognition.   6/26/23[name] enters room recalling safe standing and sitting for walker. 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 She was unable to name her shoe store or beauty shop, however with questioning cues, she generated location descriptions of these shops. In simple conversation, Rita ID'd apraxic errors with occasional min A, with cues to reduce rate to compensate, Rita Yoder self corrected apraxic errors.   08-12-21: Educated patient on evaluation results and clinical observations. Recommended speech therapy intervention to address aphasia, apraxia, and cognitive linguistic deficits. Pt and her son verbalized understanding and agreement.      PATIENT EDUCATION: Education details: see above Person educated: Patient and Child(ren) Education method: Explanation, Demonstration, and Handout Education comprehension: verbalized understanding, returned demonstration, and needs further education         GOALS: Goals reviewed with patient? Yes   SHORT TERM GOALS: Target date: 09/13/2021 (only seen 2 tx visits since eval)   Pt will verbalize 5-10 items in personally relevant categories with occasional mod A over 2 sessions  Baseline: 09-16-21 Goal status: Partially met   2.  Pt  will utilize anomia compensations and multimodal communication to augment verbal expression as needed on structured tasks with occasional mod A over 2 sessions Baseline:  Goal status: Not met   3.  Pt will ID and attempt to correct apraxic/articulation errors on structured tasks given occasional mod A over 2 sessions Baseline: 09-02-21 Goal status: Partially met   4.  Pt will demonstrate ability to recall personally relevant information with use of external aids as needed given occasional mod A over 2 sessions Baseline:  Goal status: Not met      LONG TERM GOALS: Target date: 11/08/2021     Pt will utilize anomia compensations and multimodal communication to augment verbal expression in simple conversation with occasional min A over 2 sessions Baseline:  Goal status: ONGOING   2.  Pt will ID and attempt to correct apraxic/articulation errors in simple conversation given occasional mod A over 2 sessions Baseline:  Goal status: ONGOING   3.  Pt will demonstrate ability to recall personally relevant information with use of external aids as needed given occasional min A over 2 sessions Baseline:  Goal status: ONGOING     ASSESSMENT:   CLINICAL IMPRESSION: Patient is a 81 y.o. female who was seen today for cognitive communication changes post stroke in February 2023. Demonstrates ongoing difficulty with word retrieval and changes in memory. Conducted education and training of word retrieval strategies and  apraxia strategies to aid communication effectiveness as well as cognitive strategies to aid recall of functional information. Skilled ST intervention is warranted to address verbal apraxia, aphasia, and cognitive linguistic changes to maximize return to baseline and optimize QOL.   OBJECTIVE IMPAIRMENTS include attention, memory, executive functioning, aphasia, and apraxia. These impairments are limiting patient from household responsibilities, ADLs/IADLs, and effectively communicating at  home and in community. Factors affecting potential to achieve goals and functional outcome are ability to learn/carryover information. Patient will benefit from skilled SLP services to address above impairments and improve overall function.   REHAB POTENTIAL: Good   PLAN: SLP FREQUENCY: 2x/week (pt may elect for 1x/week due to transportation)   SLP DURATION: 12 weeks   PLANNED INTERVENTIONS: Language facilitation, Environmental controls, Trials of upgraded texture/liquids, Cueing hierachy, Cognitive reorganization, Internal/external aids, Functional tasks, Multimodal communication approach, SLP instruction and feedback, Compensatory strategies, and Patient/family education   Marzetta Board, [place] 09/30/2021, 5:16 PM

## 2021-09-30 NOTE — Therapy (Signed)
OUTPATIENT PHYSICAL THERAPY TREATMENT NOTE   Patient Name: Rita Yoder MRN: [id number] DOB:Jul 31, 1940, 81 y.o., female Today's Date: 09/30/2021  PCP: Glendale Chard, MD REFERRING PROVIDER: Meredith Staggers, MD   END OF SESSION:   PT End of Session - 09/30/21 1619     Visit Number 6    Number of Visits 9   8+eval   Date for PT Re-Evaluation 10/25/21   pushed out for scheduling   Authorization Type [employer] MEDICARE    Progress Note Due on Visit 10    PT Start Time 1617    PT Stop Time 7829    PT Time Calculation (min) 41 min    Equipment Utilized During Treatment Gait belt    Activity Tolerance Patient tolerated treatment well    Behavior During Therapy WFL for tasks assessed/performed             Past Medical History:  Diagnosis Date   Chronic kidney disease 12/2009   Diabetes mellitus 2010   Dyslipidemia    Hypertension    Monoclonal gammopathy 03/2010   Past Surgical History:  Procedure Laterality Date   CATARACT EXTRACTION Bilateral 12/2020   CHOLECYSTECTOMY     HERNIA REPAIR     ROTATOR CUFF REPAIR  03/10/1992   THYROID SURGERY     TONSILLECTOMY     TOTAL ABDOMINAL HYSTERECTOMY     Patient Active Problem List   Diagnosis Date Noted   AKI (acute kidney injury) ([place])    Right-sided visual neglect    Hyperglycemia    Subcortical infarction ([place]) 05/02/2021   Stroke ([place]) 04/24/2021   Stroke (cerebrum) ([place]) 04/23/2021   Chronic kidney disease, stage IV (severe) ([place]) 08/26/2020   Weight loss 08/26/2020   Abnormal thyroid blood test 08/26/2020   Left hip pain 08/26/2020   Atherosclerosis of aorta ([place]) 08/20/2020   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   Ecchymoses, spontaneous 06/14/2019   Class 3 severe obesity due to excess calories with serious comorbidity and body mass index (BMI) of 45.0 to 49.9 in adult [facility]) 06/14/2019   Diabetic polyneuropathy associated with type 2 diabetes mellitus ([place]) 01/06/2019   BMI 40.0-44.9, adult ([place])  01/06/2019   Essential hypertension 11/25/2018   Mixed hyperlipidemia 11/25/2018   Precordial pain 11/25/2018   CKD (chronic kidney disease), symptom management only, stage 3 (moderate) ([place]) 04/20/2013    ONSET DATE: 04/23/2021 (CVA)   REFERRING DIAG: I63.9 (ICD-10-CM) - Subcortical infarction ([place])   THERAPY DIAG:  Other abnormalities of gait and mobility  Muscle weakness (generalized)  Unsteadiness on feet  Hemiplegia and hemiparesis following cerebral infarction affecting right dominant side (HCC)  Rationale for Evaluation and Treatment Rehabilitation  PERTINENT HISTORY: HTN, hyperlipidemia, DM2, polyneuropathy, Stage IV CKD, subcortical CVA  PRECAUTIONS: Fall  SUBJECTIVE: Pt denies falls.  She states everything is about the same.  PAIN:  Are you having pain? No  OBJECTIVE: (objective measures completed at initial evaluation unless otherwise dated)  TODAY'S TREATMENT:  -4" step taps x30 alt LE, attempted to progress to unilateral UE support w/ pt having difficulty continuing activity despite demonstration and one step commands. -hip hinge from EOM reaching to rollator 2 feet away 2x10; PT facilitations bend at waist and return to tall posture with pt having difficulty completing return to upright between each rep demonstrating just functional reach w/o anterior shift from hips. -Reach to cone on floor x10 > reaching forward to cone at waist height to facilitate reach outside BOS w/ return to upright x8 w/ pt  demonstrating early sitting -Standing trunk rotations x20 each side > inclusion of head turning w/ rotations, attempted using targets for inc reach w/ pt demonstrating difficulty utilizing targets so regressed to continuous reach side-to-side with improved performance > trunk rotations w/ 1.1lb weighted ball held at chest > long lever arm x15 each w/ therapist facilitation of rotation at shoulders and hips  CURB:  Level of Assistance: Modified independence Assistive  device utilized: Environmental consultant - 4 wheeled Curb Comments: Outdoor at curb step into parking lot pt performs x1 w/ SBA and cuing for step-up/down onto curb, minA for rollator management,  discussed continued min assist getting rollator onto curb w/ granddaughter as this is functional baseline.  PATIENT EDUCATION: Education details: Continue HEP.  Edu on plan for D/C next visit. Person educated: Patient and Child(ren)-Granddaughter Education method: Customer service manager Education comprehension: verbalized understanding and needs further education     HOME EXERCISE PROGRAM: Access Code: [email or web address] URL: [email or web address] Date: 09/16/2021 Prepared by: Elease Etienne  Exercises - Seated Hip Abduction with Resistance  - 1 x daily - 5 x weekly - 3 sets - 10 reps - Sit to Stand with Hands on Knees  - 1 x daily - 5 x weekly - 2 sets - 10 reps - Standing March with Counter Support  - 1 x daily - 5 x weekly - 2 sets - 20 reps - Seated Knee Extension with Resistance  - 1 x daily - 5 x weekly - 2 sets - 10 reps - Standing Single Leg Stance with Counter Support  - 1 x daily - 5 x weekly - 1 sets - 3 reps - 5-10 seconds hold - Standing Tandem Balance with Counter Support  - 1 x daily - 5 x weekly - 1 sets - 3 reps - 15 seconds hold - Tandem Walking with Counter Support  - 1 x daily - 5 x weekly - 3 sets - 10 reps - Backward Walking with Counter Support  - 1 x daily - 5 x weekly - 3 sets - 10 reps  You Can Walk For A Certain Length Of Time Each Day                          Walk 10 minutes 1-3 times per day.             Increase 1-2  minutes every 7 days              Work up to 20 minutes (1 times per day).   GOALS: Goals reviewed with patient? Yes   SHORT TERM GOALS: Target date: 09/13/2021   Pt will be independent with strength and balance HEP with supervision from family. Baseline:  Established and compliant per son, needs advancements. Goal status: MET   2.  Pt will  negotiate curb step x1 supervision level in order to improve safety with entering and exiting home environment. Baseline: CGA-minA for rollator management, unable to step onto 5" curb step. Goal status: NOT MET   3.  Pt will decrease 5xSTS to </=19 seconds in order to demonstrate decreased risk for falls and improved functional bilateral LE strength and power. Baseline: 22.37 sec w/ BUE support; 09/09/2021 18.41 sec w/ BUE support Goal status: MET   4.  Pt will demonstrate TUG of </=25 seconds in order to decrease risk of falls and improve functional mobility using LRAD. Baseline: 29 seconds w/ rollator; 09/09/2021 27.09 sec w/ rollator Goal status: PARTIALLY MET  5.  Pt will demonstrate a gait speed of >/= 1.6 feet/sec in order to decrease risk for falls. Baseline: 1.38 ft/sec; 2.32 ft/sec w/ rollator Goal status: MET   6.  Pt will increase BERG balance score to >/=33/56 to demonstrate improved static balance. Baseline:  28/56; 09/09/2021 34/56 Goal status: MET   LONG TERM GOALS: Target date: 10/11/2021   Pt will be independent with advanced HEP for strength and balance. Baseline: To be established. Goal status: INITIAL   2.  Pt will decrease 5xSTS to </=16 seconds in order to demonstrate decreased risk for falls and improved functional bilateral LE strength and power. Baseline: 22.37 sec w/ BUE support Goal status: INITIAL   3.  Pt will demonstrate TUG of </=20 seconds in order to decrease risk of falls and improve functional mobility using LRAD. Baseline: 29 seconds w/ rollator Goal status: INITIAL   4.  Pt will demonstrate a gait speed of >/=2.5 feet/sec in order to decrease risk for falls. Baseline: 1.38 ft/sec Goal status: REVISED   5.  Pt will increase BERG balance score to >/= 38/56 to demonstrate improved static balance. Baseline: 28/56 Goal status: INITIAL   ASSESSMENT:   CLINICAL IMPRESSION: Focus of skilled session on addressing components of static balance including  multidirectional reach outside BOS and stance w/o UE support.  Pt continues to demonstrate fear of falling and fear avoidance w/o UE support despite good tolerance to standing and appearance of balance.  She is making progress towards LTGs w/ plan to discharge next session with patient and family in agreement.   OBJECTIVE IMPAIRMENTS Abnormal gait, decreased activity tolerance, decreased balance, decreased endurance, decreased mobility, difficulty walking, and decreased strength.    ACTIVITY LIMITATIONS carrying, lifting, squatting, stairs, transfers, bed mobility, and locomotion level   PARTICIPATION LIMITATIONS: cleaning, driving, and community activity   PERSONAL FACTORS Age, Fitness, Past/current experiences, Time since onset of injury/illness/exacerbation, and 1-2 comorbidities: HTN, hyperlipidemia, DM2, polyneuropathy, Stage IV CKD, subcortical CVA  are also affecting patient's functional outcome.    REHAB POTENTIAL: Fair See PMH and personal factors.   CLINICAL DECISION MAKING: Stable/uncomplicated   EVALUATION COMPLEXITY: Low   PLAN: PT FREQUENCY: 1x/week   PT DURATION: 8 weeks   PLANNED INTERVENTIONS: Therapeutic exercises, Therapeutic activity, Neuromuscular re-education, Balance training, Gait training, Patient/Family education, Joint mobilization, Stair training, Vestibular training, DME instructions, Manual therapy, and Re-evaluation   PLAN FOR NEXT SESSION: Assess LTGs-D/C!  Modify HEP prn, work on SLS, narrowed BOS, static balance, backwards walking, cone taps w/ rollator, trunk control in standing   Bary Richard, PT, DPT 09/30/2021, 5:02 PM

## 2021-10-03 ENCOUNTER — Ambulatory Visit: Payer: Medicare Other

## 2021-10-03 ENCOUNTER — Encounter: Payer: Medicare Other | Admitting: Occupational Therapy

## 2021-10-07 ENCOUNTER — Ambulatory Visit: Payer: Medicare Other

## 2021-10-07 ENCOUNTER — Ambulatory Visit: Payer: Medicare Other | Admitting: Physical Therapy

## 2021-10-07 ENCOUNTER — Encounter: Payer: Medicare Other | Admitting: Occupational Therapy

## 2021-10-07 ENCOUNTER — Encounter: Payer: Self-pay | Admitting: Physical Therapy

## 2021-10-07 DIAGNOSIS — R41841 Cognitive communication deficit: Secondary | ICD-10-CM

## 2021-10-07 DIAGNOSIS — R2689 Other abnormalities of gait and mobility: Secondary | ICD-10-CM

## 2021-10-07 DIAGNOSIS — R278 Other lack of coordination: Secondary | ICD-10-CM

## 2021-10-07 DIAGNOSIS — R2681 Unsteadiness on feet: Secondary | ICD-10-CM

## 2021-10-07 DIAGNOSIS — R4701 Aphasia: Secondary | ICD-10-CM

## 2021-10-07 DIAGNOSIS — M6281 Muscle weakness (generalized): Secondary | ICD-10-CM

## 2021-10-07 DIAGNOSIS — R482 Apraxia: Secondary | ICD-10-CM

## 2021-10-07 DIAGNOSIS — I69351 Hemiplegia and hemiparesis following cerebral infarction affecting right dominant side: Secondary | ICD-10-CM

## 2021-10-07 NOTE — Therapy (Signed)
OUTPATIENT SPEECH LANGUAGE PATHOLOGY TREATMENT NOTE   Patient Name: Rita Yoder MRN: [id number] DOB:07/03/1940, 81 y.o., female Today's Date: 10/08/2021   PCP: Cyndee Brightly MD  REFERRING PROVIDER: Meredith Staggers, MD    END OF SESSION:   End of Session - 10/07/21 1452     Visit Number 6    Number of Visits 25    Date for SLP Re-Evaluation 11/08/21    Authorization Type UHC Medicare    SLP Start Time 1450    SLP Stop Time  1530    SLP Time Calculation (min) 40 min    Activity Tolerance Patient tolerated treatment well                Past Medical History:  Diagnosis Date   Chronic kidney disease 12/2009   Diabetes mellitus 2010   Dyslipidemia    Hypertension    Monoclonal gammopathy 03/2010   Past Surgical History:  Procedure Laterality Date   CATARACT EXTRACTION Bilateral 12/2020   CHOLECYSTECTOMY     HERNIA REPAIR     ROTATOR CUFF REPAIR  03/10/1992   THYROID SURGERY     TONSILLECTOMY     TOTAL ABDOMINAL HYSTERECTOMY     Patient Active Problem List   Diagnosis Date Noted   AKI (acute kidney injury) ([place])    Right-sided visual neglect    Hyperglycemia    Subcortical infarction ([place]) 05/02/2021   Stroke ([place]) 04/24/2021   Stroke (cerebrum) ([place]) 04/23/2021   Chronic kidney disease, stage IV (severe) ([place]) 08/26/2020   Weight loss 08/26/2020   Abnormal thyroid blood test 08/26/2020   Left hip pain 08/26/2020   Atherosclerosis of aorta ([place]) 08/20/2020   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   Ecchymoses, spontaneous 06/14/2019   Class 3 severe obesity due to excess calories with serious comorbidity and body mass index (BMI) of 45.0 to 49.9 in adult ([place]) 06/14/2019   Diabetic polyneuropathy associated with type 2 diabetes mellitus ([place]) 01/06/2019   BMI 40.0-44.9, adult ([place]) 01/06/2019   Essential hypertension 11/25/2018   Mixed hyperlipidemia 11/25/2018   Precordial pain 11/25/2018   CKD (chronic kidney disease), symptom management  only, stage 3 (moderate) ([place]) 04/20/2013    ONSET DATE: 05/02/2021     REFERRING DIAG: I63.9 (ICD-10-CM) - Subcortical infarction   THERAPY DIAG:  Aphasia  Verbal apraxia  Cognitive communication deficit  Rationale for Evaluation and Treatment Rehabilitation  SUBJECTIVE: "My eyes are getting worse"   PAIN:  Are you having pain? No  OBJECTIVE:   TODAY'S TREATMENT: 10-07-21: Some completion of HEP exhibited. Usual writing errors noted (did not improve with copying or dictation). Focused on functional naming of objects with usual extra processing time required. Some occasional dysnomia and vague use of descriptions exhibited when attempting to name. Targeted SFA to optimize patient ability to describe thoroughly to aid anomia. Occasional min to mod A to verbalize targeted components of SFA. Provided cuing hierarchy and instruction for anomia strategies with son to aid patient anomia at home.   09-30-21: Did not complete HEP. Difficulty recalling and verbally sequencing how to use television remote exhibited. Targeted verbal sequencing of how to make PB&J sandwich, with usual prompting to ID next appropriate step and usual mod to max A required to name specifics (ex: knife). Sentence completions were most effective to aid anomia, with granddaughter demonstrating appropriate cuing. Targeted picture naming of functional items, in which pt required usual extra processing time with some benefit for independent word retrieval. Gestures and cued descriptions were  occasionally effective to aid word finding. Updated HEP to address verbal expression deficits.   09-23-21: Difficulty relaying recent events and daily schedule exhibited related to anomia and verbal apraxia. Pt's granddaughter present and provided appropriate cues to aid word finding and recall with SLP providing positive affirmation of correct cueing techniques. SLP educated and instructed speech compensations to assist with apraxia,  including utilizing slow rate and rephrasing as needed. Recommended practice reading at sentence level at home (may need modifications due to vision). Discussed ways to engage patient at home to reduce patient frustration and facilitate increased independence. Educated use of written external aids to aid recall of personally relevant information. Pt decided fridge would be ideal place to locate external memory aids.   09-16-21: Reviewed targeted goals given delay since last ST intervention. In response to changes in apraxia, aphasia, and cognition, pt stated "sometimes good, sometimes bad." Pt unable to provide specific or functional examples independently. Daughter reported pt did recently organize pantry, in which SLP targeted generating pantry items. Pt generated items x5 with usual extended time. Daughter provided additional items, in which SLP educated and demonstrated how to implement cues versus providing targeted words to aid lexical connections. SLP trialed writing with usual perseverations exhibited (ex: TTTTT for name) with no awareness noted. Copying and dictation ability also impaired at word level when trialed. SLP highlighted errors to foster increased error recognition.   6/26/23Inez Catalina enters room recalling safe standing and sitting for walker. In personally relevant category, Zorana named 4 stores she shops at independently and 3 more with usual verbal and written cues. She was unable to name her shoe store or beauty shop, however with questioning cues, she generated location descriptions of these shops. In simple conversation, Tanika ID'd apraxic errors with occasional min A, with cues to reduce rate to compensate, Jeryl self corrected apraxic errors.   08-12-21: Educated patient on evaluation results and clinical observations. Recommended speech therapy intervention to address aphasia, apraxia, and cognitive linguistic deficits. Pt and her son verbalized understanding and agreement.       PATIENT EDUCATION: Education details: see above Person educated: Patient and Child(ren) Education method: Explanation, Demonstration, and Handout Education comprehension: verbalized understanding, returned demonstration, and needs further education         GOALS: Goals reviewed with patient? Yes   SHORT TERM GOALS: Target date: 09/13/2021 (only seen 2 tx visits since eval)   Pt will verbalize 5-10 items in personally relevant categories with occasional mod A over 2 sessions  Baseline: 09-16-21 Goal status: Partially met   2.  Pt will utilize anomia compensations and multimodal communication to augment verbal expression as needed on structured tasks with occasional mod A over 2 sessions Baseline:  Goal status: Not met   3.  Pt will ID and attempt to correct apraxic/articulation errors on structured tasks given occasional mod A over 2 sessions Baseline: 09-02-21 Goal status: Partially met   4.  Pt will demonstrate ability to recall personally relevant information with use of external aids as needed given occasional mod A over 2 sessions Baseline:  Goal status: Not met      LONG TERM GOALS: Target date: 11/08/2021     Pt will utilize anomia compensations and multimodal communication to augment verbal expression in simple conversation with occasional min A over 2 sessions Baseline:  Goal status: ONGOING   2.  Pt will ID and attempt to correct apraxic/articulation errors in simple conversation given occasional mod A over 2 sessions Baseline:  Goal  status: ONGOING   3.  Pt will demonstrate ability to recall personally relevant information with use of external aids as needed given occasional min A over 2 sessions Baseline:  Goal status: ONGOING     ASSESSMENT:   CLINICAL IMPRESSION: Patient is a 81 y.o. female who was seen today for cognitive communication changes post stroke in February 2023. Demonstrates ongoing difficulty with word retrieval and changes in memory. Conducted  education and training of word retrieval strategies and apraxia strategies to aid communication effectiveness as well as cognitive strategies to aid recall of functional information. Skilled ST intervention is warranted to address verbal apraxia, aphasia, and cognitive linguistic changes to maximize return to baseline and optimize QOL.   OBJECTIVE IMPAIRMENTS include attention, memory, executive functioning, aphasia, and apraxia. These impairments are limiting patient from household responsibilities, ADLs/IADLs, and effectively communicating at home and in community. Factors affecting potential to achieve goals and functional outcome are ability to learn/carryover information. Patient will benefit from skilled SLP services to address above impairments and improve overall function.   REHAB POTENTIAL: Good   PLAN: SLP FREQUENCY: 2x/week (pt may elect for 1x/week due to transportation)   SLP DURATION: 12 weeks   PLANNED INTERVENTIONS: Language facilitation, Environmental controls, Trials of upgraded texture/liquids, Cueing hierachy, Cognitive reorganization, Internal/external aids, Functional tasks, Multimodal communication approach, SLP instruction and feedback, Compensatory strategies, and Patient/family education   Marzetta Board, [place] 10/08/2021, 9:34 AM

## 2021-10-07 NOTE — Patient Instructions (Signed)
Cueing Hierarchy:  Description - define the word or provide the function First letter - provide the first letter of the target word Spell - spell the target word, orally Phrase Completion - give a phrase to complete First sound - provide the first sound of the target word Model - say the word aloud to repeat   Example target: "mug" The thing you put coffee in Starts with m [place] my coffee _____ "Muh" Mug

## 2021-10-07 NOTE — Therapy (Signed)
OUTPATIENT PHYSICAL THERAPY TREATMENT NOTE/DISCHARGE SUMMARY   Patient Name: Rita Yoder MRN: [id number] DOB:[birth year], 81 y.o., female Today's Date: 10/07/2021  PCP: Glendale Chard, MD REFERRING PROVIDER: Meredith Staggers, MD   PHYSICAL THERAPY DISCHARGE SUMMARY  Visits from Start of Care: 7  Current functional level related to goals / functional outcomes: See clinical impression statement.   Remaining deficits: Forward trunk lean during upright activity, some impairment of static balance w/o UE support, BLE weakness, aphasia requiring demonstration and 1-step commands intermittently.   Education / Equipment: D/C plan, safety w/ rollator management, continue HEP w/ supervision especially dynamic balance   Patient agrees to discharge. Patient goals were partially met. Patient is being discharged due to being pleased with the current functional level.   END OF SESSION:   PT End of Session - 10/07/21 1535     Visit Number 7    Number of Visits 9   8+eval   Date for PT Re-Evaluation 10/25/21   pushed out for scheduling   Authorization Type [employer] MEDICARE    Progress Note Due on Visit 10    PT Start Time 1532    PT Stop Time 1610    PT Time Calculation (min) 38 min    Equipment Utilized During Treatment Gait belt    Activity Tolerance Patient tolerated treatment well    Behavior During Therapy WFL for tasks assessed/performed             Past Medical History:  Diagnosis Date   Chronic kidney disease 12/2009   Diabetes mellitus 2010   Dyslipidemia    Hypertension    Monoclonal gammopathy 03/2010   Past Surgical History:  Procedure Laterality Date   CATARACT EXTRACTION Bilateral 12/2020   CHOLECYSTECTOMY     HERNIA REPAIR     ROTATOR CUFF REPAIR  03/10/1992   THYROID SURGERY     TONSILLECTOMY     TOTAL ABDOMINAL HYSTERECTOMY     Patient Active Problem List   Diagnosis Date Noted   AKI (acute kidney injury) ([place])    Right-sided visual neglect     Hyperglycemia    Subcortical infarction ([place]) 05/02/2021   Stroke ([place]) 04/24/2021   Stroke (cerebrum) ([place]) 04/23/2021   Chronic kidney disease, stage IV (severe) ([place]) 08/26/2020   Weight loss 08/26/2020   Abnormal thyroid blood test 08/26/2020   Left hip pain 08/26/2020   Atherosclerosis of aorta ([place]) 08/20/2020   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   Ecchymoses, spontaneous 06/14/2019   Class 3 severe obesity due to excess calories with serious comorbidity and body mass index (BMI) of 45.0 to 49.9 in adult [facility]) 06/14/2019   Diabetic polyneuropathy associated with type 2 diabetes mellitus ([place]) 01/06/2019   BMI 40.0-44.9, adult ([place]) 01/06/2019   Essential hypertension 11/25/2018   Mixed hyperlipidemia 11/25/2018   Precordial pain 11/25/2018   CKD (chronic kidney disease), symptom management only, stage 3 (moderate) ([place]) 04/20/2013    ONSET DATE: 04/23/2021 (CVA)   REFERRING DIAG: I63.9 (ICD-10-CM) - Subcortical infarction ([place])   THERAPY DIAG:  Other abnormalities of gait and mobility  Muscle weakness (generalized)  Unsteadiness on feet  Hemiplegia and hemiparesis following cerebral infarction affecting right dominant side (HCC)  Other lack of coordination  Rationale for Evaluation and Treatment Rehabilitation  PERTINENT HISTORY: HTN, hyperlipidemia, DM2, polyneuropathy, Stage IV CKD, subcortical CVA  PRECAUTIONS: Fall  SUBJECTIVE: Pt denies falls.  Son states she is getting out of the bed on her own and dressing herself and navigating the kitchen on  her own, overall is more independent which they love.  PAIN:  Are you having pain? No  OBJECTIVE: (objective measures completed at initial evaluation unless otherwise dated)  TODAY'S TREATMENT:  Verbally reviewed HEP for pt and son.  Provided additional printout and answered questions as appropriate. Assessed LTGs.  See below for details. -Assessed 5xSTS:  17.01 sec w/o UE support -TUG assessed twice,  initial w/ rollator 24.2 sec w/ pt stopping to ask if she should sit, repeated w/ rollator in 18.32 sec w/ 1-step commands -Assessed 10MWT w/ rollator:  13.29 = 2.48 ft/sec OR 0.75 m/sec -Assessed BERG:  [facility] PT Assessment - 10/07/21 1552       Berg Balance Test   Sit to Stand Able to stand without using hands and stabilize independently    Standing Unsupported Able to stand safely 2 minutes    Sitting with Back Unsupported but Feet Supported on Floor or Stool Able to sit safely and securely 2 minutes    Stand to Sit Sits safely with minimal use of hands    Transfers Able to transfer safely, definite need of hands    Standing Unsupported with Eyes Closed Able to stand 10 seconds safely    Standing Unsupported with Feet Together Needs help to attain position but able to stand for 30 seconds with feet together    From Standing, Reach Forward with Outstretched Arm Can reach forward >12 cm safely (5")    From Standing Position, Pick up Object from Floor Able to pick up shoe safely and easily    From Standing Position, Turn to Look Behind Over each Shoulder Looks behind from both sides and weight shifts well    Turn 360 Degrees Able to turn 360 degrees safely one side only in 4 seconds or less   to left in 4 seconds; right in 6 seconds   Standing Unsupported, Alternately Place Feet on Step/Stool Needs assistance to keep from falling or unable to try   pt refusing to try d/t fear of falling   Standing Unsupported, One Foot in Front Needs help to step but can hold 15 seconds   needs max encouragement to prevent early termination of activity   Standing on One Leg Tries to lift leg/unable to hold 3 seconds but remains standing independently    Total Score 40    Berg comment: Significant fall risk             PATIENT EDUCATION: Education details: Continue HEP w/ supervision and D/C plan for today.  Progress towards goals. Person educated: Patient and Child(ren)-Granddaughter Education method:  Customer service manager Education comprehension: verbalized understanding and needs further education     HOME EXERCISE PROGRAM: REPRINTED FOR SON ON 10/07/2021 w/ WALKING PROGRAM.  Access Code: [email or web address] URL: [email or web address] Date: 09/16/2021 Prepared by: Elease Etienne  Exercises - Seated Hip Abduction with Resistance  - 1 x daily - 5 x weekly - 3 sets - 10 reps - Sit to Stand with Hands on Knees  - 1 x daily - 5 x weekly - 2 sets - 10 reps - Standing March with Counter Support  - 1 x daily - 5 x weekly - 2 sets - 20 reps - Seated Knee Extension with Resistance  - 1 x daily - 5 x weekly - 2 sets - 10 reps - Standing Single Leg Stance with Counter Support  - 1 x daily - 5 x weekly - 1 sets - 3 reps - 5-10 seconds hold - Standing Tandem  Balance with Counter Support  - 1 x daily - 5 x weekly - 1 sets - 3 reps - 15 seconds hold - Tandem Walking with Counter Support  - 1 x daily - 5 x weekly - 3 sets - 10 reps - Backward Walking with Counter Support  - 1 x daily - 5 x weekly - 3 sets - 10 reps  You Can Walk For A Certain Length Of Time Each Day                          Walk 10 minutes 1-3 times per day.             Increase 1-2  minutes every 7 days              Work up to 20 minutes (1 times per day).   GOALS: Goals reviewed with patient? Yes   SHORT TERM GOALS: Target date: 09/13/2021   Pt will be independent with strength and balance HEP with supervision from family. Baseline:  Established and compliant per son, needs advancements. Goal status: MET   2.  Pt will negotiate curb step x1 supervision level in order to improve safety with entering and exiting home environment. Baseline: CGA-minA for rollator management, unable to step onto 5" curb step. Goal status: NOT MET   3.  Pt will decrease 5xSTS to </=19 seconds in order to demonstrate decreased risk for falls and improved functional bilateral LE strength and power. Baseline: 22.37 sec w/ BUE  support; 09/09/2021 18.41 sec w/ BUE support Goal status: MET   4.  Pt will demonstrate TUG of </=25 seconds in order to decrease risk of falls and improve functional mobility using LRAD. Baseline: 29 seconds w/ rollator; 09/09/2021 27.09 sec w/ rollator Goal status: PARTIALLY MET   5.  Pt will demonstrate a gait speed of >/= 1.6 feet/sec in order to decrease risk for falls. Baseline: 1.38 ft/sec; 2.32 ft/sec w/ rollator Goal status: MET   6.  Pt will increase BERG balance score to >/=33/56 to demonstrate improved static balance. Baseline:  28/56; 09/09/2021 34/56 Goal status: MET   LONG TERM GOALS: Target date: 10/11/2021   Pt will be independent with advanced HEP for strength and balance. Baseline: Established, updated, and pt compliant. Goal status: MET   2.  Pt will decrease 5xSTS to </=16 seconds in order to demonstrate decreased risk for falls and improved functional bilateral LE strength and power. Baseline: 22.37 sec w/ BUE support; 10/07/2021 17.01 sec w/o UE support Goal status: PARTIALLY MET   3.  Pt will demonstrate TUG of </=20 seconds in order to decrease risk of falls and improve functional mobility using LRAD. Baseline: 29 seconds w/ rollator; 10/07/2021 18.32 sec Goal status: MET   4.  Pt will demonstrate a gait speed of >/=2.5 feet/sec in order to decrease risk for falls. Baseline: 1.38 ft/sec; 10/07/2021 2.48 ft/sec Goal status: PARTIALLY MET   5.  Pt will increase BERG balance score to >/= 38/56 to demonstrate improved static balance. Baseline: 28/56; 10/07/2021 40/56 Goal status: MET   ASSESSMENT:   CLINICAL IMPRESSION: Assessed LTGs this session in preparation for discharge today.  Pt demonstrates improved static balance on BERG assessment improving from a 28/56 on evaluation to 40/56 this session.  Her gait speed continues to improve just shy of goal level at 2.48 ft/sec with use of rollator.  Her TUG time improved to 18.32 seconds with use of rollator on second  trial requiring 1-step commands to complete test.  Her 5xSTS improved to 17.01 seconds w/o UE support which was just under goal level, but still improved from prior trials w/ use of UE support.  Verbally reviewed HEP this session and answered questions for patient and son in regards to safety of performance and continued maintenance of gains at home.  At this time patient is eager to discharge and at adequate level to do so with continued supervision from family.   OBJECTIVE IMPAIRMENTS Abnormal gait, decreased activity tolerance, decreased balance, decreased endurance, decreased mobility, difficulty walking, and decreased strength.    ACTIVITY LIMITATIONS carrying, lifting, squatting, stairs, transfers, bed mobility, and locomotion level   PARTICIPATION LIMITATIONS: cleaning, driving, and community activity   PERSONAL FACTORS Age, Fitness, Past/current experiences, Time since onset of injury/illness/exacerbation, and 1-2 comorbidities: HTN, hyperlipidemia, DM2, polyneuropathy, Stage IV CKD, subcortical CVA  are also affecting patient's functional outcome.    REHAB POTENTIAL: Fair See PMH and personal factors.   CLINICAL DECISION MAKING: Stable/uncomplicated   EVALUATION COMPLEXITY: Low   PLAN: PT FREQUENCY: 1x/week   PT DURATION: 8 weeks   PLANNED INTERVENTIONS: Therapeutic exercises, Therapeutic activity, Neuromuscular re-education, Balance training, Gait training, Patient/Family education, Joint mobilization, Stair training, Vestibular training, DME instructions, Manual therapy, and Re-evaluation   PLAN FOR NEXT SESSION: N/A   Bary Richard, PT, DPT 10/07/2021, 4:40 PM

## 2021-10-10 ENCOUNTER — Encounter: Payer: Medicare Other | Admitting: Occupational Therapy

## 2021-10-10 ENCOUNTER — Ambulatory Visit: Payer: Medicare Other

## 2021-10-21 ENCOUNTER — Encounter: Payer: Self-pay | Admitting: Internal Medicine

## 2021-10-21 ENCOUNTER — Other Ambulatory Visit: Payer: Medicare Other

## 2021-10-21 DIAGNOSIS — E039 Hypothyroidism, unspecified: Secondary | ICD-10-CM

## 2021-10-22 LAB — TSH: TSH: 20.3 u[IU]/mL — ABNORMAL HIGH (ref 0.450–4.500)

## 2021-10-23 ENCOUNTER — Encounter: Payer: Medicare Other | Attending: Registered Nurse | Admitting: Physical Medicine & Rehabilitation

## 2021-10-23 ENCOUNTER — Encounter: Payer: Self-pay | Admitting: Physical Medicine & Rehabilitation

## 2021-10-23 ENCOUNTER — Encounter: Payer: Self-pay | Admitting: Internal Medicine

## 2021-10-23 VITALS — BP 120/62 | HR 55 | Ht 66.0 in | Wt 195.0 lb

## 2021-10-23 DIAGNOSIS — I639 Cerebral infarction, unspecified: Secondary | ICD-10-CM

## 2021-10-23 NOTE — Patient Instructions (Signed)
PLEASE FEEL FREE TO CALL OUR OFFICE WITH ANY PROBLEMS OR QUESTIONS ([phone])      

## 2021-10-23 NOTE — Progress Notes (Signed)
Subjective:    Patient ID: Rita Yoder, female    DOB: [birth year], 81 y.o.   MRN: [id number]  HPI  Rita Yoder is here in follow up of her left brain infarcts. She graduated from outpt therapy at [facility] neuro [facility]. She is doing more around the house, getting out in the community. She is able to use the bathroom on her own, can dress on her own.  She is typically using her rolling walker at home.  She may take a few steps without it.  Pain levels have generally improved.  She denies any falls.  She is still having  some issues with word and object recognition. She saw an optometrist who struggled with localizing her deficits and said there wasn't much he could do. He recommended seeing a neuro-ophthalmologist.   Patient remains on [place] for seizure prophylaxis per neurology.  No further seizures have been reported.  Appetite has been slightly better.  Patient is making some efforts to eat more but family often has to cue her.  Bowels and bladder are working fairly well. Pain Inventory Average Pain 6 Pain Right Now 0 My pain is intermittent, stabbing, tingling, and aching  LOCATION OF PAIN  left leg, left groin, left hip  BOWEL Number of stools per week: 2 Oral laxative use Yes  Type of laxative Miralax  BLADDER Normal and Pads Bladder incontinence Yes  Frequent urination Yes     Mobility walk without assistance use a walker how many minutes can you walk? 10 ability to climb steps?  yes do you drive?  no Do you have any goals in this area?  yes  Function disabled: date disabled unknown I need assistance with the following:  bathing and household duties Do you have any goals in this area?  yes  Neuro/Psych bladder control problems numbness  Prior Studies Any changes since last visit?  no  Physicians involved in your care Any changes since last visit?  no   Family History  Problem Relation Age of Onset   Healthy Mother    Heart attack Brother    Cancer  Maternal Aunt    Cancer Maternal Uncle    Heart Problems Maternal Grandmother    Social History   Socioeconomic History   Marital status: Married    Spouse name: Not on file   Number of children: 4   Years of education: Not on file   Highest education level: Not on file  Occupational History   Occupation: retrired  Tobacco Use   Smoking status: Never   Smokeless tobacco: Never  Vaping Use   Vaping Use: Never used  Substance and Sexual Activity   Alcohol use: No   Drug use: Never   Sexual activity: Not Currently  Other Topics Concern   Not on file  Social History Narrative   Not on file   Social Determinants of Health   Financial Resource Strain: Low Risk  (12/20/2020)   Overall Financial Resource Strain (CARDIA)    Difficulty of Paying Living Expenses: Not hard at all  Food Insecurity: No Food Insecurity (12/20/2020)   Hunger Vital Sign    Worried About Running Out of Food in the Last Year: Never true    [place] in the Last Year: Never true  Transportation Needs: No Transportation Needs (12/20/2020)   PRAPARE - Hydrologist (Medical): No    Lack of Transportation (Non-Medical): No  Physical Activity: Inactive (12/20/2020)  Exercise Vital Sign    Days of Exercise per Week: 0 days    Minutes of Exercise per Session: 0 min  Stress: No Stress Concern Present (12/20/2020)   [place]    Feeling of Stress : Not at all  Social Connections: Not on file   Past Surgical History:  Procedure Laterality Date   CATARACT EXTRACTION Bilateral 12/2020   CHOLECYSTECTOMY     HERNIA REPAIR     ROTATOR CUFF REPAIR  03/10/1992   THYROID SURGERY     TONSILLECTOMY     TOTAL ABDOMINAL HYSTERECTOMY     Past Medical History:  Diagnosis Date   Chronic kidney disease 12/2009   Diabetes mellitus 2010   Dyslipidemia    Hypertension    Monoclonal gammopathy 03/2010   Ht '5\' 6"'$   (1.676 m)   Wt 195 lb (88.5 kg)   BMI 31.47 kg/m   Opioid Risk Score:   Fall Risk Score:  `1  Depression screen [facility] 2/9     07/17/2021   10:40 AM 06/05/2021    9:12 AM 12/20/2020   12:19 PM 11/30/2019    2:30 PM 11/30/2019    2:07 PM 11/18/2018    2:26 PM 01/20/2018   11:14 AM  Depression screen PHQ 2/9  Decreased Interest 0 0 0 0 0 0 0  Down, Depressed, Hopeless 0 0 0 0 0 0 0  PHQ - 2 Score 0 0 0 0 0 0 0  Altered sleeping  0    0   Tired, decreased energy  2    0   Change in appetite  2    0   Feeling bad or failure about yourself   0    0   Trouble concentrating      0   Moving slowly or fidgety/restless  0    0   Suicidal thoughts  0    0   PHQ-9 Score  4    0   Difficult doing work/chores      Not difficult at all     Review of Systems  Genitourinary:  Positive for frequency.  Musculoskeletal:  Positive for gait problem.  Neurological:  Positive for numbness.  All other systems reviewed and are negative.      Objective:   Physical Exam  General: No acute distress HEENT: NCAT, EOMI, oral membranes moist Cards: reg rate  Chest: normal effort Abdomen: Soft, NT, ND Skin: dry, intact Extremities: no edema Psych: pleasant and appropriate t Skin: Clean and intact without signs of breakdown Neuro: Did not perform a formal visual test although she did demonstrate reasonable depth perception and acuity for basic objects around the room and during our neurological examination.  ORIENTED to person, place.  Improved word finding deficits.  Language much more fluid.  Improving insight and awareness although she still lacks..  Right upper extremity is 3+ to 4 out of 5 proximal distal.  Right lower extremity is 4 out of 5.  Left upper and lower 4+ to 5 out of 5.  Decreased sensation distally.  No focal resting tone.  Patient stood fairly easily from a seated position.  She was intake a few steps without her walker but ultimately walked with her rolling walker and demonstrated  reasonable balance.  She did let the walker get a bit too far out in front of her  and she tends to bend forward a bit when she ambulates as well.  Musculoskeletal: Mild edema bilaterally in the lower extremities.  There is a small area of scar tissue/old cyst along the medial right calf.  Area is nontender.         Assessment & Plan:    1. Functional deficits secondary to acute multifocal infarct including left ischemic thalamus, left parietal cortical and subcortical infarcts             -She has completed outpt therapies.   -Encouraged activity as tolerated at home.  She still needs some supervision.  However she can walk on her own with her walker and for short distances without.  Needs to be thinking about safety however. 2.  Antithrombotics: -Eliquis              3. Pain/left shoulder girdle pain:               -Pain appears improved 4. Mood: Family appears quite supportive.  Mood is upbeat 5. ? Seizure: Continue on Keppra per neuro.    6. Afib, htn: cardiology follows her 7. Visual deficits: Patient reports problems with visual recognition and reading.  Is also visual acuity deficits layered into this.  A lot  of this is language based.  -will make referral to Dr. Gevena Cotton for further assessment and to help find an appropriate visual prescription for her.   15 minutes of face to face patient care time were spent during this visit. All questions were encouraged and answered.  follow up with me prn. Marland Kitchen

## 2021-10-27 ENCOUNTER — Other Ambulatory Visit: Payer: Self-pay | Admitting: Internal Medicine

## 2021-10-28 ENCOUNTER — Other Ambulatory Visit: Payer: Self-pay

## 2021-10-28 DIAGNOSIS — E039 Hypothyroidism, unspecified: Secondary | ICD-10-CM

## 2021-11-27 ENCOUNTER — Encounter: Payer: Self-pay | Admitting: Internal Medicine

## 2021-12-06 ENCOUNTER — Other Ambulatory Visit: Payer: Self-pay | Admitting: Internal Medicine

## 2021-12-07 ENCOUNTER — Other Ambulatory Visit: Payer: Self-pay | Admitting: Internal Medicine

## 2021-12-07 DIAGNOSIS — H9193 Unspecified hearing loss, bilateral: Secondary | ICD-10-CM

## 2021-12-18 ENCOUNTER — Other Ambulatory Visit: Payer: Medicare Other

## 2021-12-18 DIAGNOSIS — E039 Hypothyroidism, unspecified: Secondary | ICD-10-CM | POA: Diagnosis not present

## 2021-12-19 LAB — TSH: TSH: 4.48 u[IU]/mL (ref 0.450–4.500)

## 2021-12-20 ENCOUNTER — Encounter: Payer: Self-pay | Admitting: Internal Medicine

## 2021-12-23 ENCOUNTER — Ambulatory Visit: Payer: Medicare Other | Attending: Physical Medicine & Rehabilitation | Admitting: Audiologist

## 2021-12-23 DIAGNOSIS — H903 Sensorineural hearing loss, bilateral: Secondary | ICD-10-CM | POA: Diagnosis not present

## 2021-12-23 DIAGNOSIS — I639 Cerebral infarction, unspecified: Secondary | ICD-10-CM | POA: Diagnosis not present

## 2021-12-23 NOTE — Procedures (Signed)
Outpatient Audiology and [place], [place]  [id number] [phone]  AUDIOLOGICAL  EVALUATION  NAME: Rita Yoder     DOB:   December 14, 1940      MRN: [id number]                                                                                     DATE: 12/23/2021     REFERENT: Glendale Chard, MD STATUS: Outpatient DIAGNOSIS: Sensorineural Hearing Loss, Bilateral     History: Rita Yoder was seen for an audiological evaluation. Rita Yoder was accompanied to the appointment by her son.  Rita Yoder is receiving a hearing evaluation due to concerns for hearing loss in both ears. Rita Yoder has difficulty hearing in both ears after having a stoke this February. Rita Yoder is aware she likely had some degree of loss before her stroke. Ever since it has been harder to understand people. No pain or pressure reported in either ear. Tinnitus denied in both ears. Rita Yoder has no significant history of noise exposure.  Medical history shows left sided stroke, diabetes with neuropathy, and stage four kidney disease which are a risk factor for hearing loss. No other relevant case history reported.   Evaluation:  Otoscopy showed a no view of the tympanic membranes due to cerumen build up, bilaterally Tympanometry results were consistent with normal middle pressure, bilaterally   Audiometric testing was completed using conventional audiometry with supraural transducer. Speech Recognition Thresholds were  50dB in the right ear and 45dB in the left ear. Word Recognition was performed 40 dB SL, scored 84% in the right ear and 92% in the left ear. Pure tone thresholds show mild sloping to moderately severe sensorineural hearing loss in each.   Results:  The test results were reviewed with [facility]. Rita Yoder has moderate hearing loss in each ear due to age and medical complications. Rita Yoder is a candidate for hearing aids. They were strongly recommended due to her stroke and close relationship of brain  health and hearing loss. Rita Yoder and her son reported understanding. He was given a handout on Debrox and a handout on how to contact her insurance for hearing aids.    Recommendations: Amplification is necessary for both ears. Hearing aids can be purchased from a variety of locations. See provided list for locations in the [place] area.  Debrox Earwax Removal Drops are a safe and inexpensive in-home solution for wax removal. Debrox Earwax Removal Kit includes a soft rubber bulb syringe to rinse your ear after using Debrox Earwax Removal Drops. Excessive earwax build-up can lead to ear discomfort and reduced hearing, which can affect your day-to-day life. The kit can be purchased over the counter at [place], [place], [facility], and most other pharmacies.   How to use the Debrox Earwax Removal Drops Kit:  tilt head sideways. place 5 to 10 drops into ear. tip of applicator should not enter ear canal. keep drops in ear for several minutes by keeping head tilted or placing cotton in the ear. use twice daily for up to four days  gently flush ear with water, using soft rubber bulb syringe after final treatment (on 4th day)   45 minutes spent testing  and counseling on results.   Alfonse Alpers  Audiologist, Au.D., CCC-A 12/23/2021  1:11 PM  Cc: Glendale Chard, MD

## 2021-12-25 ENCOUNTER — Encounter: Payer: Self-pay | Admitting: Internal Medicine

## 2021-12-26 ENCOUNTER — Other Ambulatory Visit: Payer: Self-pay

## 2021-12-26 MED ORDER — LEVOTHYROXINE SODIUM 75 MCG PO TABS: 75.0000 ug | ORAL_TABLET | Freq: Every day | ORAL | 1 refills | Status: DC

## 2022-01-15 ENCOUNTER — Encounter: Payer: Self-pay | Admitting: Internal Medicine

## 2022-01-21 ENCOUNTER — Encounter: Payer: Self-pay | Admitting: Internal Medicine

## 2022-01-22 ENCOUNTER — Other Ambulatory Visit: Payer: Self-pay

## 2022-01-22 ENCOUNTER — Observation Stay (HOSPITAL_COMMUNITY)
Admission: EM | Admit: 2022-01-22 | Discharge: 2022-01-23 | Disposition: A | Discharge status: Home Health | Payer: Medicare Other | Attending: Internal Medicine | Admitting: Internal Medicine

## 2022-01-22 ENCOUNTER — Ambulatory Visit (HOSPITAL_COMMUNITY)
Admission: EM | Admit: 2022-01-22 | Discharge: 2022-01-22 | Disposition: A | Discharge status: Home / Self Care | Payer: Medicare Other

## 2022-01-22 ENCOUNTER — Encounter: Payer: Self-pay | Admitting: Internal Medicine

## 2022-01-22 ENCOUNTER — Observation Stay (HOSPITAL_COMMUNITY): Payer: Medicare Other

## 2022-01-22 ENCOUNTER — Encounter (HOSPITAL_COMMUNITY): Payer: Self-pay | Admitting: Pharmacy Technician

## 2022-01-22 ENCOUNTER — Emergency Department (HOSPITAL_COMMUNITY): Payer: Medicare Other

## 2022-01-22 DIAGNOSIS — I10 Essential (primary) hypertension: Secondary | ICD-10-CM | POA: Diagnosis not present

## 2022-01-22 DIAGNOSIS — E039 Hypothyroidism, unspecified: Secondary | ICD-10-CM | POA: Diagnosis not present

## 2022-01-22 DIAGNOSIS — N184 Chronic kidney disease, stage 4 (severe): Secondary | ICD-10-CM | POA: Diagnosis not present

## 2022-01-22 DIAGNOSIS — Z79899 Other long term (current) drug therapy: Secondary | ICD-10-CM | POA: Insufficient documentation

## 2022-01-22 DIAGNOSIS — G40109 Localization-related (focal) (partial) symptomatic epilepsy and epileptic syndromes with simple partial seizures, not intractable, without status epilepticus: Secondary | ICD-10-CM | POA: Diagnosis not present

## 2022-01-22 DIAGNOSIS — I480 Paroxysmal atrial fibrillation: Secondary | ICD-10-CM | POA: Insufficient documentation

## 2022-01-22 DIAGNOSIS — E1122 Type 2 diabetes mellitus with diabetic chronic kidney disease: Secondary | ICD-10-CM | POA: Diagnosis not present

## 2022-01-22 DIAGNOSIS — M6281 Muscle weakness (generalized): Secondary | ICD-10-CM | POA: Diagnosis not present

## 2022-01-22 DIAGNOSIS — G459 Transient cerebral ischemic attack, unspecified: Secondary | ICD-10-CM | POA: Insufficient documentation

## 2022-01-22 DIAGNOSIS — I130 Hypertensive heart and chronic kidney disease with heart failure and stage 1 through stage 4 chronic kidney disease, or unspecified chronic kidney disease: Secondary | ICD-10-CM | POA: Diagnosis not present

## 2022-01-22 DIAGNOSIS — N179 Acute kidney failure, unspecified: Secondary | ICD-10-CM | POA: Insufficient documentation

## 2022-01-22 DIAGNOSIS — R471 Dysarthria and anarthria: Secondary | ICD-10-CM | POA: Insufficient documentation

## 2022-01-22 DIAGNOSIS — Z7901 Long term (current) use of anticoagulants: Secondary | ICD-10-CM | POA: Insufficient documentation

## 2022-01-22 DIAGNOSIS — I5032 Chronic diastolic (congestive) heart failure: Secondary | ICD-10-CM | POA: Insufficient documentation

## 2022-01-22 DIAGNOSIS — R4701 Aphasia: Secondary | ICD-10-CM | POA: Diagnosis present

## 2022-01-22 DIAGNOSIS — E872 Acidosis, unspecified: Secondary | ICD-10-CM | POA: Insufficient documentation

## 2022-01-22 DIAGNOSIS — I63412 Cerebral infarction due to embolism of left middle cerebral artery: Secondary | ICD-10-CM

## 2022-01-22 DIAGNOSIS — G9389 Other specified disorders of brain: Secondary | ICD-10-CM | POA: Diagnosis not present

## 2022-01-22 DIAGNOSIS — I639 Cerebral infarction, unspecified: Secondary | ICD-10-CM | POA: Diagnosis not present

## 2022-01-22 LAB — COMPREHENSIVE METABOLIC PANEL
ALT: 20 U/L (ref 0–44)
AST: 21 U/L (ref 15–41)
Albumin: 3.7 g/dL (ref 3.5–5.0)
Alkaline Phosphatase: 85 U/L (ref 38–126)
Anion gap: 11 (ref 5–15)
BUN: 46 mg/dL — ABNORMAL HIGH (ref 8–23)
CO2: 18 mmol/L — ABNORMAL LOW (ref 22–32)
Calcium: 9.1 mg/dL (ref 8.9–10.3)
Chloride: 113 mmol/L — ABNORMAL HIGH (ref 98–111)
Creatinine, Ser: 3.55 mg/dL — ABNORMAL HIGH (ref 0.44–1.00)
GFR, Estimated: 12 mL/min — ABNORMAL LOW (ref >60)
Glucose, Bld: 116 mg/dL — ABNORMAL HIGH (ref 70–99)
Potassium: 4.3 mmol/L (ref 3.5–5.1)
Sodium: 142 mmol/L (ref 135–145)
Total Bilirubin: 0.7 mg/dL (ref 0.3–1.2)
Total Protein: 6.8 g/dL (ref 6.5–8.1)

## 2022-01-22 LAB — CBC
HCT: 31.9 % — ABNORMAL LOW (ref 36.0–46.0)
Hemoglobin: 9.9 g/dL — ABNORMAL LOW (ref 12.0–15.0)
MCH: 32.1 pg (ref 26.0–34.0)
MCHC: 31 g/dL (ref 30.0–36.0)
MCV: 103.6 fL — ABNORMAL HIGH (ref 80.0–100.0)
Platelets: 177 10*3/uL (ref 150–400)
RBC: 3.08 MIL/uL — ABNORMAL LOW (ref 3.87–5.11)
RDW: 13.9 % (ref 11.5–15.5)
WBC: 11.7 10*3/uL — ABNORMAL HIGH (ref 4.0–10.5)
nRBC: 0 % (ref 0.0–0.2)

## 2022-01-22 LAB — I-STAT CHEM 8, ED
BUN: 42 mg/dL — ABNORMAL HIGH (ref 8–23)
Calcium, Ion: 1.18 mmol/L (ref 1.15–1.40)
Chloride: 113 mmol/L — ABNORMAL HIGH (ref 98–111)
Creatinine, Ser: 3.7 mg/dL — ABNORMAL HIGH (ref 0.44–1.00)
Glucose, Bld: 111 mg/dL — ABNORMAL HIGH (ref 70–99)
HCT: 30 % — ABNORMAL LOW (ref 36.0–46.0)
Hemoglobin: 10.2 g/dL — ABNORMAL LOW (ref 12.0–15.0)
Potassium: 4.3 mmol/L (ref 3.5–5.1)
Sodium: 142 mmol/L (ref 135–145)
TCO2: 21 mmol/L — ABNORMAL LOW (ref 22–32)

## 2022-01-22 LAB — DIFFERENTIAL
Abs Immature Granulocytes: 0.06 10*3/uL (ref 0.00–0.07)
Basophils Absolute: 0 10*3/uL (ref 0.0–0.1)
Basophils Relative: 0 %
Eosinophils Absolute: 0.1 10*3/uL (ref 0.0–0.5)
Eosinophils Relative: 1 %
Immature Granulocytes: 1 %
Lymphocytes Relative: 21 %
Lymphs Abs: 2.5 10*3/uL (ref 0.7–4.0)
Monocytes Absolute: 1 10*3/uL (ref 0.1–1.0)
Monocytes Relative: 8 %
Neutro Abs: 8.1 10*3/uL — ABNORMAL HIGH (ref 1.7–7.7)
Neutrophils Relative %: 69 %

## 2022-01-22 LAB — PROTIME-INR
INR: 1.7 — ABNORMAL HIGH (ref 0.8–1.2)
Prothrombin Time: 19.3 seconds — ABNORMAL HIGH (ref 11.4–15.2)

## 2022-01-22 LAB — APTT: aPTT: 40 seconds — ABNORMAL HIGH (ref 24–36)

## 2022-01-22 MED ORDER — POLYETHYLENE GLYCOL 3350 17 G PO PACK: 17.0000 g | PACK | Freq: Every day | ORAL | Status: DC | PRN

## 2022-01-22 MED ORDER — ACETAMINOPHEN 325 MG PO TABS: 650.0000 mg | ORAL_TABLET | Freq: Four times a day (QID) | ORAL | Status: DC | PRN

## 2022-01-22 MED ORDER — DIPHENHYDRAMINE HCL 25 MG PO CAPS: 25.0000 mg | ORAL_CAPSULE | Freq: Four times a day (QID) | ORAL | Status: DC | PRN

## 2022-01-22 MED ORDER — ATORVASTATIN CALCIUM 40 MG PO TABS
40.0000 mg | ORAL_TABLET | Freq: Every day | ORAL | Status: DC
  Administered 2022-01-23: 40 mg via ORAL
  Filled 2022-01-22: qty 1

## 2022-01-22 MED ORDER — FESOTERODINE FUMARATE ER 4 MG PO TB24
4.0000 mg | ORAL_TABLET | Freq: Every day | ORAL | Status: DC
  Filled 2022-01-22 (×2): qty 1

## 2022-01-22 MED ORDER — LEVOTHYROXINE SODIUM 50 MCG PO TABS
50.0000 ug | ORAL_TABLET | Freq: Every day | ORAL | Status: DC
  Administered 2022-01-23: 50 ug via ORAL
  Filled 2022-01-22: qty 1

## 2022-01-22 MED ORDER — SODIUM CHLORIDE 0.45 % IV SOLN
INTRAVENOUS | Status: DC
  Administered 2022-01-22: 1000 mL via INTRAVENOUS

## 2022-01-22 MED ORDER — LABETALOL HCL 5 MG/ML IV SOLN: 5.0000 mg | INTRAVENOUS | Status: DC | PRN

## 2022-01-22 MED ORDER — LEVETIRACETAM 500 MG PO TABS
500.0000 mg | ORAL_TABLET | Freq: Two times a day (BID) | ORAL | Status: DC
  Administered 2022-01-22 – 2022-01-23 (×2): 500 mg via ORAL
  Filled 2022-01-22 (×2): qty 1

## 2022-01-22 MED ORDER — APIXABAN 2.5 MG PO TABS
2.5000 mg | ORAL_TABLET | Freq: Two times a day (BID) | ORAL | Status: DC
  Administered 2022-01-22 – 2022-01-23 (×2): 2.5 mg via ORAL
  Filled 2022-01-22 (×2): qty 1

## 2022-01-22 MED ORDER — MELATONIN 5 MG PO TABS
5.0000 mg | ORAL_TABLET | Freq: Every evening | ORAL | Status: DC | PRN
  Administered 2022-01-22: 5 mg via ORAL
  Filled 2022-01-22: qty 1

## 2022-01-22 MED ORDER — VITAMIN D 25 MCG (1000 UNIT) PO TABS
2000.0000 [IU] | ORAL_TABLET | Freq: Every day | ORAL | Status: DC
  Administered 2022-01-23: 2000 [IU] via ORAL
  Filled 2022-01-22: qty 2

## 2022-01-22 MED ORDER — PROCHLORPERAZINE EDISYLATE 10 MG/2ML IJ SOLN: 5.0000 mg | Freq: Four times a day (QID) | INTRAMUSCULAR | Status: DC | PRN

## 2022-01-22 NOTE — ED Notes (Signed)
Patient is being discharged from the Urgent Care and sent to the Emergency Department via POV . Per Dr Mannie Stabile due to neurological episode and history of CVA. Patient is aware and verbalizes understanding of plan of care.  Vitals:   01/22/22 1142  BP: 138/60  Pulse: 65  Resp: 20  Temp: 98.2 F (36.8 C)  SpO2: 99%

## 2022-01-22 NOTE — ED Triage Notes (Signed)
Pt here via pov with family with reports of waking up yesterday with a facial droop and expressive aphasia. Pt family states symptoms have since resolved. Spoke with PCP and sent here for a scan.

## 2022-01-22 NOTE — ED Notes (Signed)
Admitting at [facility]

## 2022-01-22 NOTE — ED Provider Notes (Signed)
[place] EMERGENCY DEPARTMENT Provider Note   CSN: [id number] Arrival date & time: 01/22/22  1214     History {Add pertinent medical, surgical, social history, OB history to HPI:1} Chief Complaint  Patient presents with   Stroke Symptoms    Rita Yoder is a 81 y.o. female.  HPI     Home Medications Prior to Admission medications   Medication Sig Start Date End Date Taking? Authorizing Provider  amiodarone (PACERONE) 200 MG tablet Take 1/2 tablets (100 mg total) by mouth daily. 08/26/21   Glendale Chard, MD  amLODipine (NORVASC) 5 MG tablet Take 1 tablet (5 mg total) by mouth daily. 09/09/21 09/09/22  Glendale Chard, MD  atorvastatin (LIPITOR) 40 MG tablet TAKE 1 TABLET BY MOUTH DAILY 10/28/21   Glendale Chard, MD  Blood Glucose Calibration (OT ULTRA/FASTTK CNTRL SOLN) SOLN  01/10/16   [provider]  Cholecalciferol (VITAMIN D3) 50 MCG (2000 UT) TABS Take 2,000 Units by mouth daily. 05/23/21   Setzer, Edman Circle, PA-C  ELIQUIS 2.5 MG TABS tablet TAKE 1 TABLET BY MOUTH TWICE  DAILY 10/28/21   Glendale Chard, MD  isosorbide mononitrate (IMDUR) 60 MG 24 hr tablet TAKE 1 TABLET BY MOUTH DAILY 10/28/21   Glendale Chard, MD  levETIRAcetam (KEPPRA) 500 MG tablet TAKE 1 TABLET BY MOUTH TWICE  DAILY 12/10/21   Glendale Chard, MD  levothyroxine (SYNTHROID) 75 MCG tablet Take 1 tablet (75 mcg total) by mouth daily. 12/26/21 12/26/22  Glendale Chard, MD  Menthol-Methyl Salicylate (MUSCLE RUB) 10-15 % CREA Apply 1 application. topically 2 (two) times daily. 05/23/21   Setzer, Edman Circle, PA-C  MODERNA COVID-19 BIVAL BOOSTER 50 MCG/0.5ML injection  02/01/21   [provider]  nitroGLYCERIN (NITROSTAT) 0.4 MG SL tablet DISSOLVE 1 TABLET UNDER THE TONGUE AT FIRST SIGN OF ATTACKS, MAY REPEAT EVERY 5 MINUTES UP TO 3 TABLETS, IF NO RELLIEF SEEK MEDICAL HELP Patient taking differently: Place 0.4 mg under the tongue every 5 (five) minutes x 3 doses as needed for chest  pain (SEEK HELP, IF NO RELIEF). 08/20/20   Glendale Chard, MD  polyethylene glycol (MIRALAX / GLYCOLAX) 17 g packet Take 17 g by mouth 2 (two) times daily. 05/23/21   Setzer, Edman Circle, PA-C  senna (SENOKOT) 8.6 MG TABS tablet Take 1 tablet (8.6 mg total) by mouth daily. 05/23/21   Setzer, Edman Circle, PA-C  solifenacin (VESICARE) 5 MG tablet Take 5 mg by mouth daily. 03/16/19   [provider]      Allergies    Losartan    Review of Systems   Review of Systems  Physical Exam Updated Vital Signs BP 111/67   Pulse (!) 58   Temp 97.7 F (36.5 C)   Resp 16   SpO2 100%  Physical Exam  ED Results / Procedures / Treatments   Labs (all labs ordered are listed, but only abnormal results are displayed) Labs Reviewed  PROTIME-INR - Abnormal; Notable for the following components:      Result Value   Prothrombin Time 19.3 (*)    INR 1.7 (*)    All other components within normal limits  APTT - Abnormal; Notable for the following components:   aPTT 40 (*)    All other components within normal limits  CBC - Abnormal; Notable for the following components:   WBC 11.7 (*)    RBC 3.08 (*)    Hemoglobin 9.9 (*)    HCT 31.9 (*)    MCV 103.6 (*)  All other components within normal limits  DIFFERENTIAL - Abnormal; Notable for the following components:   Neutro Abs 8.1 (*)    All other components within normal limits  COMPREHENSIVE METABOLIC PANEL - Abnormal; Notable for the following components:   Chloride 113 (*)    CO2 18 (*)    Glucose, Bld 116 (*)    BUN 46 (*)    Creatinine, Ser 3.55 (*)    GFR, Estimated 12 (*)    All other components within normal limits  I-STAT CHEM 8, ED - Abnormal; Notable for the following components:   Chloride 113 (*)    BUN 42 (*)    Creatinine, Ser 3.70 (*)    Glucose, Bld 111 (*)    TCO2 21 (*)    Hemoglobin 10.2 (*)    HCT 30.0 (*)    All other components within normal limits  ETHANOL  RAPID URINE DRUG SCREEN, HOSP PERFORMED  URINALYSIS,  ROUTINE W REFLEX MICROSCOPIC    EKG None  Radiology No results found.  Procedures Procedures  {Document cardiac monitor, telemetry assessment procedure when appropriate:1}  Medications Ordered in ED Medications - No data to display  ED Course/ Medical Decision Making/ A&P                           Medical Decision Making  ***  {Document critical care time when appropriate:1} {Document review of labs and clinical decision tools ie heart score, Chads2Vasc2 etc:1}  {Document your independent review of radiology images, and any outside records:1} {Document your discussion with family members, caretakers, and with consultants:1} {Document social determinants of health affecting pt's care:1} {Document your decision making why or why not admission, treatments were needed:1} Final Clinical Impression(s) / ED Diagnoses Final diagnoses:  None    Rx / DC Orders ED Discharge Orders     None

## 2022-01-22 NOTE — ED Provider Triage Note (Signed)
Emergency Medicine Provider Triage Evaluation Note  Jeffery Gammell , a 81 y.o. female  was evaluated in triage.  Pt complains of neurodeficits. Patient has a history of stroke.  Yesterday she had about 4 minutes of facial droop dysarthria and generalized weakness.  Her son states that they contacted PCP who felt she need to come in for evaluation of TIA.  Symptoms are resolved Review of Systems  Positive: Neurodeficits Negative: Headache  Physical Exam  There were no vitals taken for this visit. Gen:   Awake, no distress   Resp:  Normal effort  MSK:   Moves extremities without difficulty  Other:  Obvious facial droop or dysarthria  Medical Decision Making  Medically screening exam initiated at 2:21 PM.  Appropriate orders placed.  Louvina Cleary was informed that the remainder of the evaluation will be completed by another provider, this initial triage assessment does not replace that evaluation, and the importance of remaining in the ED until their evaluation is complete.     Margarita Mail, PA-C 01/22/22 1422

## 2022-01-22 NOTE — H&P (Addendum)
History and Physical  Rita Yoder LYY:[id number] DOB: [birth year] DOA: 01/22/2022  Referring physician: Dr. Rogene Yoder, Rita Yoder  PCP: Rita Chard, MD  Outpatient Specialists: Neurology, Cardiology Patient coming from: Home  Chief Complaint: Facial droop, slurred speech.  HPI: Rita Yoder is a 81 y.o. female with medical history significant for prior CVA in February 2023, paroxysmal A-fib on Eliquis, essential hypertension, hyperlipidemia, hypothyroidism, CKD 4, HFpEF 12-75% grade 1 diastolic dysfunction, history of subarachnoid hemorrhage on Keppra, who presented to [facility] ED from home for facial droop and slurred speech.  Last known well yesterday morning around 8:30 AM.  Was noted to have facial droop by family member.  This was associated with slurred speech that lasted a few minutes.  Symptoms have now resolved.  Family reported missing 4 doses of Eliquis, as she ran out of the medication.  She resumed Eliquis several days ago.  In the ED, work-up revealed acute/subacute infarct in the left parietal lobe, superimposed on area of chronic infarct, seen on MRI brain.  Also notable on MRI brain advanced chronic microvascular ischemic changes of the white matter, hemosiderin deposit in the right parietal occipital region suggesting hemosiderin deposit from prior subarachnoid hemorrhage.  EDP discussed the case with neuro hospitalist and TR[facility], hospitalist service was asked to admit.  At the time of the visit, her facial droop has resolved.  Per family speech is back to baseline.  No focal motor deficits.  ED Course: Tmax 98.2.  BP 127/86, pulse 65, respiration rate 21, O2 saturation 100% on room air.  Lab studies remarkable for serum bicarb 18, BUN 46, creatinine 3.55, GFR 12.  WBC 11.7, hemoglobin 10.2, platelet 177.  Review of Systems: Review of systems as noted in the HPI. All other systems reviewed and are negative.   Past Medical History:  Diagnosis Date   Chronic kidney disease  12/2009   Diabetes mellitus 2010   Dyslipidemia    Hypertension    Monoclonal gammopathy 03/2010   Past Surgical History:  Procedure Laterality Date   CATARACT EXTRACTION Bilateral 12/2020   CHOLECYSTECTOMY     HERNIA REPAIR     ROTATOR CUFF REPAIR  03/10/1992   THYROID SURGERY     TONSILLECTOMY     TOTAL ABDOMINAL HYSTERECTOMY      Social History:  reports that she has never smoked. She has never used smokeless tobacco. She reports that she does not drink alcohol and does not use drugs.   Allergies  Allergen Reactions   Losartan Other (See Comments)    MD discontinued this because of decreased kidney function    Family History  Problem Relation Age of Onset   Healthy Mother    Heart attack Brother    Cancer Maternal Aunt    Cancer Maternal Uncle    Heart Problems Maternal Grandmother       Prior to Admission medications   Medication Sig Start Date End Date Taking? Authorizing Provider  amiodarone (PACERONE) 200 MG tablet Take 1/2 tablets (100 mg total) by mouth daily. 08/26/21  Yes Rita Chard, MD  amLODipine (NORVASC) 5 MG tablet Take 1 tablet (5 mg total) by mouth daily. 09/09/21 09/09/22 Yes Rita Chard, MD  atorvastatin (LIPITOR) 40 MG tablet TAKE 1 TABLET BY MOUTH DAILY 10/28/21  Yes Rita Chard, MD  Cholecalciferol (VITAMIN D3) 50 MCG (2000 UT) TABS Take 2,000 Units by mouth daily. 05/23/21  Yes Rita Yoder, Rita Circle, PA-C  ELIQUIS 2.5 MG TABS tablet TAKE 1 TABLET BY MOUTH TWICE  DAILY  10/28/21  Yes Rita Chard, MD  isosorbide mononitrate (IMDUR) 60 MG 24 hr tablet TAKE 1 TABLET BY MOUTH DAILY 10/28/21  Yes Rita Chard, MD  levETIRAcetam (KEPPRA) 500 MG tablet TAKE 1 TABLET BY MOUTH TWICE  DAILY 12/10/21  Yes Rita Chard, MD  levothyroxine (SYNTHROID) 50 MCG tablet Take 50 mcg by mouth daily before breakfast.   Yes [provider]  Menthol-Methyl Salicylate (MUSCLE RUB) 10-15 % CREA Apply 1 application. topically 2 (two) times daily. Patient taking  differently: Apply 1 application  topically as needed for muscle pain. 05/23/21  Yes Rita Yoder, Rita Circle, PA-C  nitroGLYCERIN (NITROSTAT) 0.4 MG SL tablet DISSOLVE 1 TABLET UNDER THE TONGUE AT FIRST SIGN OF ATTACKS, MAY REPEAT EVERY 5 MINUTES UP TO 3 TABLETS, IF NO RELLIEF SEEK MEDICAL HELP Patient taking differently: Place 0.4 mg under the tongue every 5 (five) minutes x 3 doses as needed for chest pain (SEEK HELP, IF NO RELIEF). 08/20/20  Yes Rita Chard, MD  solifenacin (VESICARE) 5 MG tablet Take 5 mg by mouth daily. 03/16/19  Yes [provider]  Blood Glucose Calibration (OT ULTRA/FASTTK CNTRL SOLN) SOLN  01/10/16   [provider]  levothyroxine (SYNTHROID) 75 MCG tablet Take 1 tablet (75 mcg total) by mouth daily. Patient not taking: Reported on 01/22/2022 12/26/21 12/26/22  Rita Chard, MD  MODERNA COVID-19 BIVAL BOOSTER 50 MCG/0.5ML injection  02/01/21   [provider]    Physical Exam: BP 127/86   Pulse (!) 59   Temp (!) 97.5 F (36.4 C)   Resp (!) 21   SpO2 100%   General: 81 y.o. year-old female well developed well nourished in no acute distress.  Alert and oriented x3.  Hard of hearing. Cardiovascular: Regular rate and rhythm with no rubs or gallops.  No thyromegaly or JVD noted.  No lower extremity edema. 2/4 pulses in all 4 extremities. Respiratory: Clear to auscultation with no wheezes or rales. Good inspiratory effort. Abdomen: Soft nontender nondistended with normal bowel sounds x4 quadrants. Muskuloskeletal: No cyanosis, clubbing or edema noted bilaterally Neuro: CN II-XII intact, strength, sensation, reflexes Skin: No ulcerative lesions noted or rashes Psychiatry: Judgement and insight appear normal. Mood is appropriate for condition and setting          Labs on Admission:  Basic Metabolic Panel: Recent Labs  Lab 01/22/22 1424 01/22/22 1510  NA 142 142  K 4.3 4.3  CL 113* 113*  CO2 18*  --   GLUCOSE 116* 111*  BUN 46* 42*   CREATININE 3.55* 3.70*  CALCIUM 9.1  --    Liver Function Tests: Recent Labs  Lab 01/22/22 1424  AST 21  ALT 20  ALKPHOS 85  BILITOT 0.7  PROT 6.8  ALBUMIN 3.7   No results for input(s): "LIPASE", "AMYLASE" in the last 168 hours. No results for input(s): "AMMONIA" in the last 168 hours. CBC: Recent Labs  Lab 01/22/22 1424 01/22/22 1510  WBC 11.7*  --   NEUTROABS 8.1*  --   HGB 9.9* 10.2*  HCT 31.9* 30.0*  MCV 103.6*  --   PLT 177  --    Cardiac Enzymes: No results for input(s): "CKTOTAL", "CKMB", "CKMBINDEX", "TROPONINI" in the last 168 hours.  BNP (last 3 results) No results for input(s): "BNP" in the last 8760 hours.  ProBNP (last 3 results) No results for input(s): "PROBNP" in the last 8760 hours.  CBG: No results for input(s): "GLUCAP" in the last 168 hours.  Radiological Exams on Admission:  MR BRAIN WO CONTRAST  Result Date: 01/22/2022 CLINICAL DATA:  Transient ischemic attack. EXAM: MRI HEAD WITHOUT CONTRAST TECHNIQUE: Multiplanar, multiecho pulse sequences of the brain and surrounding structures were obtained without intravenous contrast. COMPARISON:  MRI of the brain April 23, 2021. FINDINGS: Brain: Area of restricted diffusion involving the cortex and white matter of the left parietal lobe measuring approximately 3 cm, consistent with acute/subacute infarct. This is superimposed in an area of cortical infarct seen on prior MRI of the performed in February 2023. No hemorrhagic transformation. Tissue loss with surrounding gliosis and laminar cortical necrosis is seen in the left parietal lobe related to prior infarct. No acute hemorrhage, hydrocephalus, mass or midline shift. Foci of susceptibility artifact are seen in the right parietooccipital region, suggesting hemosiderin deposit from prior subarachnoid hemorrhage. Scattered and confluent foci of T2 hyperintensity are seen within the white matter of the cerebral hemispheres and within the pons, nonspecific,  most likely related to chronic small vessel ischemia. Vascular: Normal flow voids. Skull and upper cervical spine: Normal marrow signal. Sinuses/Orbits: Mucous retention cyst in the right maxillary sinus. Bilateral lens surgery Other: None. IMPRESSION: 1. Acute/subacute infarct in the left parietal lobe, superimposed on an area of chronic infarct. 2. Advanced chronic microvascular ischemic changes of the white matter. 3. Hemosiderin deposit in the right parietooccipital region, suggesting hemosiderin deposit from prior subarachnoid hemorrhage. These results were called by telephone at the time of interpretation on 01/22/2022 at 4:37 pm to provider ABIGAIL HARRIS , who verbally acknowledged these results. Electronically Signed   By: Pedro Earls M.D.   On: 01/22/2022 16:37    EKG: I independently viewed the EKG done and my findings are as followed: Sinus rhythm rate of 58.  Nonspecific ST-T changes.  QTc 454.  Assessment/Plan Present on Admission:  CVA (cerebral vascular accident) [facility])  Principal Problem:   CVA (cerebral vascular accident) ([place])  Acute/subacute infarct left parietal lobe, POA Prior history of CVA in February 2023 involving left parietal lobe, follows with Dr. Leonie Man outpatient. Also history of subarachnoid hemorrhage, on Keppra, seizure prevention. Abnormal MRA head and neck from February 2023. Was on Eliquis for secondary CVA prevention, missed 4 doses. Patient had a recent CVA work-up with MRA head and neck, complete 2D echo, defer further additional work-up to neurology. Last LDL 78, goal less than 7 0 Last A1c 4.5, goal less than 7.0 PT/OT/speech therapist assessment in the morning Fall and aspiration precautions Permissive hypertension, treat SBP greater than 220 or DBP greater than 120 with as needed IV labetalol. DOAC/antiplatelet per neurology recommendations  AKI on CKD 4 Baseline creatinine appears to be 3.0 with GFR of 14 Presented with creatinine  of 3.55 with GFR of 12 Avoid nephrotoxic agents, dehydration and hypotension Gentle IV fluid hydration half-normal saline at 50 cc/h x 1 day Monitor urine output  Mild non-anion gap metabolic acidosis Serum bicarb 18, anion gap of 11 Gentle IV fluid hydration Repeat chemistry panel in the morning  Essential hypertension Permissive hypertension in place Closely monitor vital signs  Hypothyroidism Resume home levothyroxine  HFpEF 60-65% Resume home cardiac medications.  Hyperlipidemia Resume home Lipitor  Ambulatory dysfunction Uses a walker at baseline Fall precautions.    DVT prophylaxis: Eliquis  Code Status: Full code  Family Communication: Family at bedside.  Disposition Plan: Admitted to telemetry medical unit  Consults called: Neurohospitalist  Admission status: Observation status   Status is: Observation    Kayleen Memos MD [facility] Pager [phone]  If 7PM-7AM, please contact night-coverage [email or web address] Password [email or web address]  01/22/2022, 7:10 PM

## 2022-01-22 NOTE — ED Triage Notes (Signed)
Yesterday morning, patient woke 9am, woke with facial droop disappeared within 4 minutes.  Inability to dress self last for an hour.  Reportedly, patient usually can dress herself.  Speech was garbled yesterday.   Son spoke to provider on my chart.  Pcp instructed family to have patient checked out .  Patient has appt with pcp this Thursday.  Son reports patient appears at baseline today  Patient had a stroke in February 2023

## 2022-01-22 NOTE — ED Notes (Signed)
MD reports ok to give pt something to eat if pt passes swallow screen.

## 2022-01-22 NOTE — Consult Note (Signed)
Neurology Consultation Reason for Consult: Stroke on MRI Requesting Physician: Irene Pap  CC: Transient speech difficulty  History is obtained from: Daughter via phone and patient as well as chart review  HPI: Rita Yoder is a 81 y.o. 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  She has been in her usual state of health although unfortunately there was a delay in mail order Eliquis arriving.  In this setting she missed 4 doses of Eliquis on Monday and Tuesday last week.  Subsequently she resumed her Eliquis and has not missed any doses including taking her last dose this morning.  However yesterday morning she had a transient episode of facial droop and speech arrest with difficulty dressing herself.  Daughter notes it is not unusual for her to have a facial droop at the end of the day when she is tired, but she would typically not have 1 in the morning.  No witnessed episodes of shaking.  The patient did have a stroke in February 2023 at which time she was found to have new onset atrial fibrillation as an etiology and was started on Eliquis 2.5 mg twice daily.  Hospitalization was complicated by an episode of right upper extremity twitching felt to be focal seizure for which she was started on Keppra.  EEG was not obtained due to her wig glued in place.  Daughter reports no missed doses of Keppra and no other concerns or complaints.  No witnessed right upper extremity twitching although sometimes she has bilateral upper extremity shaking during which time she is still responsive.   LKW: 11/14 at 8:30 AM Thrombolytic given?: No, out of the window Premorbid modified rankin scale:      2 - Slight disability. Able to look after own affairs without assistance, but unable to carry out all previous  activities.  ROS: Unable to obtain due to altered mental status.   Past Medical History:  Diagnosis Date   Chronic kidney disease 12/2009   Diabetes mellitus 2010   Dyslipidemia    Hypertension    Monoclonal gammopathy 03/2010   Past Surgical History:  Procedure Laterality Date   CATARACT EXTRACTION Bilateral 12/2020   CHOLECYSTECTOMY     HERNIA REPAIR     ROTATOR CUFF REPAIR  03/10/1992   THYROID SURGERY     TONSILLECTOMY     TOTAL ABDOMINAL HYSTERECTOMY     Current Outpatient Medications  Medication Instructions   amiodarone (PACERONE) 200 MG tablet Take 1/2 tablets (100 mg total) by mouth daily.   amLODipine (NORVASC) 5 mg, Oral, Daily   atorvastatin (LIPITOR) 40 mg, Oral, Daily   Blood Glucose Calibration (OT ULTRA/FASTTK CNTRL SOLN) SOLN No dose, route, or frequency recorded.   Eliquis 2.5 mg, Oral, 2 times daily   isosorbide mononitrate (IMDUR) 60 mg, Oral, Daily   levETIRAcetam (KEPPRA) 500 mg, Oral, 2 times daily   levothyroxine (SYNTHROID) 75 mcg, Oral, Daily   levothyroxine (SYNTHROID) 50 mcg, Oral, Daily before breakfast   Menthol-Methyl Salicylate (MUSCLE RUB) 78-46 % CREA 1 application , Topical, 2 times daily   MODERNA COVID-19 BIVAL BOOSTER 50 MCG/0.5ML injection No dose, route, or frequency recorded.   nitroGLYCERIN (NITROSTAT) 0.4 MG SL tablet DISSOLVE 1 TABLET UNDER THE TONGUE AT FIRST SIGN OF ATTACKS, MAY REPEAT EVERY 5 MINUTES UP TO 3 TABLETS, IF NO RELLIEF SEEK MEDICAL HELP   solifenacin (VESICARE) 5 mg, Oral,  Daily   Vitamin D 2,000 Units, Oral, Daily     Family History  Problem Relation Age of Onset   Healthy Mother    Heart attack Brother    Cancer Maternal Aunt    Cancer Maternal Uncle    Heart Problems Maternal Grandmother     Social History:  reports that she has never smoked. She has never used smokeless tobacco. She reports that she does not drink alcohol and does not use drugs.   Exam: Current vital signs: BP (!) 154/72   Pulse (!)  59   Temp 98.7 F (37.1 C)   Resp (!) 21   SpO2 100%  Vital signs in last 24 hours: Temp:  [97.5 F (36.4 C)-98.7 F (37.1 C)] 98.7 F (37.1 C) (11/15 2041) Pulse Rate:  [54-74] 59 (11/15 2041) Resp:  [12-22] 21 (11/15 1830) BP: (111-191)/(60-117) 154/72 (11/15 1915) SpO2:  [99 %-100 %] 100 % (11/15 2041)   Physical Exam  Constitutional: Appears well-developed and well-nourished.  Psych: Affect pleasant and cooperative Eyes: No scleral injection HENT: No oropharyngeal obstruction.  MSK: no joint deformities.  Cardiovascular: Perfusing extremities well Respiratory: Effort normal, non-labored breathing GI: Soft.  No distension. There is no tenderness.  Skin: Warm dry and intact visible skin  Neuro: Mental Status: Patient is awake, alert, oriented to person, place, month, but reports she is 61 instead of 81 She notes she gets easily confused since her stroke and is not able to provide significant history.  She does have some intermittent difficulty following commands as well such as finger-to-nose testing, but is able to follow simple commands and able to name some simple objects.  Speech is disfluent Cranial Nerves: II: Visual Fields are full to orienting to stimuli in all quadrants, but she does not reliably report number of fingers, intermittently seems to have a little more difficulty staying on the right side. Pupils are equal, round, and reactive to light.   III,IV, VI: EOMI without ptosis or diploplia.  V: Facial sensation is symmetric to temperature VII: Facial movement is notable for mild right facial droop VIII: hearing is intact to voice X: Uvula elevates symmetrically XI: Shoulder shrug is symmetric. XII: tongue is midline without atrophy or fasciculations.  Motor: Tone is normal. Bulk is normal. 5/5 strength was present in all four extremities other than the proximal weakness, 4/5 in the bilateral deltoids, 4/5 right hip flexion and 4+/5 left hip  flexion Sensory: Reports sensation is intact to light touch in all 4 extremities Deep Tendon Reflexes: 3+ and symmetric in the brachioradialis and 2+ patella  Plantars: Toes are downgoing bilaterally.  Cerebellar: Heel-to-shin intact bilaterally, reaches her hands to her nose bilaterally, but does not understand command to reach back out to examiner's finger Gait:  Deferred   NIHSS total 6 Score breakdown: One-point for not answering age correctly, one-point for right facial droop, one-point for left upper extremity weakness, one-point for right upper extremity weakness, one-point for moderate aphasia, one-point for mild dysarthria Performed at 9:30 PM     I have reviewed labs in epic and the results pertinent to this consultation are:  Basic Metabolic Panel: Recent Labs  Lab 01/22/22 1424 01/22/22 1510  NA 142 142  K 4.3 4.3  CL 113* 113*  CO2 18*  --   GLUCOSE 116* 111*  BUN 46* 42*  CREATININE 3.55* 3.70*  CALCIUM 9.1  --     CBC: Recent Labs  Lab 01/22/22 1424 01/22/22 1510  WBC 11.7*  --  NEUTROABS 8.1*  --   HGB 9.9* 10.2*  HCT 31.9* 30.0*  MCV 103.6*  --   PLT 177  --     Coagulation Studies: Recent Labs    01/22/22 1424  LABPROT 19.3*  INR 1.7*    Lab Results  Component Value Date   HGBA1C 4.5 (L) 04/24/2021   Lab Results  Component Value Date   CHOL 130 04/24/2021   HDL 34 (L) 04/24/2021   LDLCALC 78 04/24/2021   TRIG 90 04/24/2021   CHOLHDL 3.8 04/24/2021     Lab Results  Component Value Date   [id number] 828 08/20/2020     I have reviewed the images obtained: MRI brain personally reviewed, agree with radiology:  1. Acute/subacute infarct in the left parietal lobe, superimposed on an area of chronic infarct. 2. Advanced chronic microvascular ischemic changes of the white matter. 3. Hemosiderin deposit in the right parietooccipital region, suggesting hemosiderin deposit from prior subarachnoid hemorrhage.  Impression:  81 year old with recurrent stroke, in the setting of several days of missed Eliquis dose.  On appearance I favor more subacute timeline consistent with when she missed her Eliquis.  Given she has been tolerating Eliquis without issue, risk of stopping Eliquis is outweighed by the benefit of stopping Eliquis at this time and I would continue it.  I am concerned that given her fluctuating symptoms breakthrough focal seizure may be playing a role  Recommendations: - Continuous EEG monitoring to confirm seizure control - Continue home Keppra 500 mg twice daily for now, suspect that this will be maximized for her renal function but pending confirmation of her creatinine clearance at this time - A1c and lipid panel for goal A1c less than 7%, LDL less than 70 - No need to repeat echocardiogram or other work-up given she is medically optimized with Eliquis - Neurology will continue to follow  [place] [phone] Available 7 AM to 7 PM, outside these hours please contact Neurologist on call listed on AMION

## 2022-01-23 ENCOUNTER — Ambulatory Visit: Payer: Medicare Other | Admitting: Internal Medicine

## 2022-01-23 ENCOUNTER — Ambulatory Visit: Payer: Medicare Other

## 2022-01-23 DIAGNOSIS — R4182 Altered mental status, unspecified: Secondary | ICD-10-CM | POA: Diagnosis not present

## 2022-01-23 DIAGNOSIS — R569 Unspecified convulsions: Secondary | ICD-10-CM

## 2022-01-23 DIAGNOSIS — I63412 Cerebral infarction due to embolism of left middle cerebral artery: Secondary | ICD-10-CM | POA: Diagnosis not present

## 2022-01-23 DIAGNOSIS — I639 Cerebral infarction, unspecified: Secondary | ICD-10-CM | POA: Diagnosis not present

## 2022-01-23 LAB — CBC WITH DIFFERENTIAL/PLATELET
Abs Immature Granulocytes: 0.04 10*3/uL (ref 0.00–0.07)
Basophils Absolute: 0 10*3/uL (ref 0.0–0.1)
Basophils Relative: 0 %
Eosinophils Absolute: 0.1 10*3/uL (ref 0.0–0.5)
Eosinophils Relative: 1 %
HCT: 28.2 % — ABNORMAL LOW (ref 36.0–46.0)
Hemoglobin: 9.3 g/dL — ABNORMAL LOW (ref 12.0–15.0)
Immature Granulocytes: 0 %
Lymphocytes Relative: 19 %
Lymphs Abs: 2.2 10*3/uL (ref 0.7–4.0)
MCH: 32.9 pg (ref 26.0–34.0)
MCHC: 33 g/dL (ref 30.0–36.0)
MCV: 99.6 fL (ref 80.0–100.0)
Monocytes Absolute: 1 10*3/uL (ref 0.1–1.0)
Monocytes Relative: 9 %
Neutro Abs: 8.5 10*3/uL — ABNORMAL HIGH (ref 1.7–7.7)
Neutrophils Relative %: 71 %
Platelets: 163 10*3/uL (ref 150–400)
RBC: 2.83 MIL/uL — ABNORMAL LOW (ref 3.87–5.11)
RDW: 13.6 % (ref 11.5–15.5)
WBC: 11.9 10*3/uL — ABNORMAL HIGH (ref 4.0–10.5)
nRBC: 0 % (ref 0.0–0.2)

## 2022-01-23 LAB — MAGNESIUM: Magnesium: 1.9 mg/dL (ref 1.7–2.4)

## 2022-01-23 LAB — COMPREHENSIVE METABOLIC PANEL
ALT: 20 U/L (ref 0–44)
AST: 18 U/L (ref 15–41)
Albumin: 3.1 g/dL — ABNORMAL LOW (ref 3.5–5.0)
Alkaline Phosphatase: 76 U/L (ref 38–126)
Anion gap: 8 (ref 5–15)
BUN: 43 mg/dL — ABNORMAL HIGH (ref 8–23)
CO2: 18 mmol/L — ABNORMAL LOW (ref 22–32)
Calcium: 8.7 mg/dL — ABNORMAL LOW (ref 8.9–10.3)
Chloride: 112 mmol/L — ABNORMAL HIGH (ref 98–111)
Creatinine, Ser: 3.13 mg/dL — ABNORMAL HIGH (ref 0.44–1.00)
GFR, Estimated: 14 mL/min — ABNORMAL LOW (ref >60)
Glucose, Bld: 85 mg/dL (ref 70–99)
Potassium: 4.3 mmol/L (ref 3.5–5.1)
Sodium: 138 mmol/L (ref 135–145)
Total Bilirubin: 0.4 mg/dL (ref 0.3–1.2)
Total Protein: 6 g/dL — ABNORMAL LOW (ref 6.5–8.1)

## 2022-01-23 LAB — PHOSPHORUS: Phosphorus: 3 mg/dL (ref 2.5–4.6)

## 2022-01-23 LAB — ETHANOL: Alcohol, Ethyl (B): <10 mg/dL (ref <10)

## 2022-01-23 NOTE — ED Notes (Signed)
PT/OT/Speech  at bedside

## 2022-01-23 NOTE — Progress Notes (Signed)
vLTM discontinued.  No skin breakdown noted at all skin sites

## 2022-01-23 NOTE — Progress Notes (Signed)
LTM EEG hooked up and running - no initial skin breakdown - push button tested - No [facility] at this time. Patient in the ED.

## 2022-01-23 NOTE — Evaluation (Signed)
Physical Therapy Evaluation Patient Details Name: Rita Yoder MRN: [id number] DOB: 02-14-1941 Today's Date: 01/23/2022  History of Present Illness  Rita Yoder is a 81 y.o. female admitted 01/22/22 with facial droop, speech arrest, difficulty with ADL. MRI revealed infarct L parietal lobe, superimposed on chronic infarct. 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  Clinical Impression  Pt admitted with above diagnosis. Pt was able to ambulate with nursing with min guard assist with RW.  Pt on EEG when PT in room but appears to be close to baseline.  Will follow acutely and progress pt as able.  Pt currently with functional limitations due to the deficits listed below (see PT Problem List). Pt will benefit from skilled PT to increase their independence and safety with mobility to allow discharge to the venue listed below.          Recommendations for follow up therapy are one component of a multi-disciplinary discharge planning process, led by the attending physician.  Recommendations may be updated based on patient status, additional functional criteria and insurance authorization.  Follow Up Recommendations Home health PT      Assistance Recommended at Discharge Intermittent Supervision/Assistance  Patient can return home with the following  A little help with walking and/or transfers;Assistance with cooking/housework;Assist for transportation;Help with stairs or ramp for entrance    Equipment Recommendations None recommended by PT  Recommendations for Other Services       Functional Status Assessment Patient has had a recent decline in their functional status and demonstrates the ability to make significant improvements in function in a reasonable and predictable amount of time.     Precautions / Restrictions  Precautions Precautions: Fall Restrictions Weight Bearing Restrictions: No      Mobility  Bed Mobility Overal bed mobility: Needs Assistance Bed Mobility: Rolling Rolling: Min guard         General bed mobility comments: No assist to roll.  EEG tech came in and was continuing to put glue on the EEG electrodes as they werent adhering well.    Transfers Overall transfer level: Needs assistance Equipment used: Rolling walker (2 wheels) Transfers: Sit to/from Stand Sit to Stand: Min guard           General transfer comment: Nurse reported pt did not need assist to stand    Ambulation/Gait Ambulation/Gait assistance: Min guard Gait Distance (Feet): 15 Feet Assistive device: Rolling walker (2 wheels)     Gait velocity interpretation: <1.31 ft/sec, indicative of household ambulator   General Gait Details: Nurse got pt up and off EEG machine after PT left and stated she walked well with RW support to bathroom and back.  Stairs            Wheelchair Mobility    Modified Rankin (Stroke Patients Only)       Balance                                             Pertinent Vitals/Pain Pain Assessment Pain Assessment: Faces Faces Pain Scale: Hurts little more Pain Location: left hip Pain Descriptors / Indicators: Aching, Grimacing, Guarding Pain Intervention(s): Limited activity within patient's tolerance, Monitored during session, Repositioned    Home Living Family/patient expects to be discharged to:: Private residence Living Arrangements: Children Available Help  at Discharge: Family;Available 24 hours/day Type of Home: House Home Access: Level entry (has small step to enter, approx. 3")       Home Layout: One level Home Equipment: Conservation officer, nature (2 wheels);Cane - single point;BSC/3in1;Tub bench;Grab bars - tub/shower Additional Comments: Daughter cooks and cleans, children drive    Prior Function Prior Level of Function : Needs  assist             Mobility Comments: using a SPC or RW depending on her L hip pain, could walk without assist but son stays close per pt ADLs Comments: indep     Hand Dominance   Dominant Hand: Right    Extremity/Trunk Assessment   Upper Extremity Assessment Upper Extremity Assessment: Defer to OT evaluation    Lower Extremity Assessment Lower Extremity Assessment: Generalized weakness       Communication   Communication: No difficulties  Cognition Arousal/Alertness: Awake/alert Behavior During Therapy: WFL for tasks assessed/performed Overall Cognitive Status: Impaired/Different from baseline Area of Impairment: Memory, Orientation                 Orientation Level: Time (states 2020)   Memory: Decreased short-term memory, Decreased recall of precautions                  General Comments General comments (skin integrity, edema, etc.): 100% RA, 95/59    Exercises General Exercises - Lower Extremity Ankle Circles/Pumps: AROM, Both, 10 reps, Supine Quad Sets: AROM, Both, 10 reps, Supine Heel Slides: AROM, Both, 10 reps, Supine, AAROM Straight Leg Raises: AAROM, Both, 10 reps, Supine   Assessment/Plan    PT Assessment Patient needs continued PT services  PT Problem List Decreased activity tolerance;Decreased balance;Decreased mobility;Decreased knowledge of use of DME;Decreased safety awareness;Decreased knowledge of precautions       PT Treatment Interventions DME instruction;Gait training;Functional mobility training;Therapeutic activities;Therapeutic exercise;Balance training;Patient/family education;Stair training    PT Goals (Current goals can be found in the Care Plan section)  Acute Rehab PT Goals Patient Stated Goal: to go home PT Goal Formulation: With patient Time For Goal Achievement: 02/06/22 Potential to Achieve Goals: Good    Frequency Min 4X/week     Co-evaluation               AM-PAC PT "6 Clicks" Mobility   Outcome Measure Help needed turning from your back to your side while in a flat bed without using bedrails?: A Little Help needed moving from lying on your back to sitting on the side of a flat bed without using bedrails?: A Little Help needed moving to and from a bed to a chair (including a wheelchair)?: A Little Help needed standing up from a chair using your arms (e.g., wheelchair or bedside chair)?: A Little Help needed to walk in hospital room?: A Little Help needed climbing 3-5 steps with a railing? : A Lot 6 Click Score: 17    End of Session Equipment Utilized During Treatment: Gait belt Activity Tolerance: Patient tolerated treatment well Patient left: with call bell/phone within reach (on stretcher) Nurse Communication: Mobility status PT Visit Diagnosis: Muscle weakness (generalized) (M62.81)    Time: 8676-7209 PT Time Calculation (min) (ACUTE ONLY): 23 min   Charges:   PT Evaluation $PT Eval Moderate Complexity: 1 Mod PT Treatments $Therapeutic Exercise: 8-22 mins        [facility] M,PT Acute Rehab Services [phone]   Alvira Philips 01/23/2022, 1:13 PM

## 2022-01-23 NOTE — ED Notes (Signed)
Bed placement notified pt is being d/c shortly no bed needed

## 2022-01-23 NOTE — Discharge Summary (Signed)
Physician Discharge Summary  Rita Yoder VOH:[id number] DOB: 1940-05-15 DOA: 01/22/2022  PCP: Glendale Chard, MD  Admit date: 01/22/2022 Discharge date: 01/23/2022  Admitted From: Home Disposition:  Home  Recommendations for Outpatient Follow-up:  Follow up with PCP in 1-2 weeks Repeat labs with PCP   Home Health: Home health PT Equipment/Devices: No new equipment  Discharge Condition: Stable CODE STATUS: Full Diet recommendation: Low-salt low-fat diet  Brief/Interim Summary: Rita Yoder is a 81 y.o. female with medical history significant for prior CVA in February 2023, paroxysmal A-fib on Eliquis, essential hypertension, hyperlipidemia, hypothyroidism, CKD 4, HFpEF 69-48% grade 1 diastolic dysfunction, history of subarachnoid hemorrhage on Keppra, who presented to [facility] ED from home for facial droop and slurred speech.    Patient admitted as above with concern over acute stroke given recurrent episodes of slurred speech and facial droop.  MRI did confirm an acute to subacute infarct of the left parietal lobe.  Patient has had issues getting her medications filled via mail order and subsequently has missed multiple doses of Eliquis likely the etiology for her acute stroke in the setting of paroxysmal A-fib.  Neurology evaluated, EEG unremarkable, otherwise patient stable and agreeable for discharge home.  No medication changes, discussed the importance of medication compliance, if she were to run out of medications again via mail order she can always call PCP and get local refills for emergency use without incurring extra costs.  Family and patient was unaware this was an option.  Follow-up with PCP in the next 1 to 2 weeks for routine evaluation post Hospital care.  Continue home health PT, discussed increasing activity levels as well as improvement in diet and lifestyle.   Discharge Diagnoses:  Principal Problem:   CVA (cerebral vascular accident) ([place])   Acute/subacute  infarct left parietal lobe, POA Prior history of CVA in February 2023 involving left parietal lobe, follows with Dr. Leonie Man outpatient. Was on Eliquis for secondary CVA prevention, missed 4 doses. PT/OT -recommending home health PT Resume anticoagulation per previous, no medication changes at this time   AKI on CKD 4 Improving, repeat labs with PCP next week, instructed patient to increase p.o. free water intake   Mild non-anion gap metabolic acidosis In the setting of dehydration and AKI Resolving   Essential hypertension Resume home medications at discharge, no changes at this time   Hypothyroidism Resume home levothyroxine   HFpEF 60-65% A-fib, paroxysmal Resume home cardiac medications, no change this   Hyperlipidemia Resume home Lipitor   Ambulatory dysfunction Uses a walker at baseline Fall precautions.  Discharge Instructions   Allergies as of 01/23/2022       Reactions   Losartan Other (See Comments)   MD discontinued this because of decreased kidney function        Medication List     TAKE these medications    amiodarone 200 MG tablet Commonly known as: PACERONE Take 1/2 tablets (100 mg total) by mouth daily.   amLODipine 5 MG tablet Commonly known as: NORVASC Take 1 tablet (5 mg total) by mouth daily.   atorvastatin 40 MG tablet Commonly known as: LIPITOR TAKE 1 TABLET BY MOUTH DAILY   Eliquis 2.5 MG Tabs tablet Generic drug: apixaban TAKE 1 TABLET BY MOUTH TWICE  DAILY   isosorbide mononitrate 60 MG 24 hr tablet Commonly known as: IMDUR TAKE 1 TABLET BY MOUTH DAILY   levETIRAcetam 500 MG tablet Commonly known as: KEPPRA TAKE 1 TABLET BY MOUTH TWICE  DAILY  levothyroxine 50 MCG tablet Commonly known as: SYNTHROID Take 50 mcg by mouth daily before breakfast. What changed: Another medication with the same name was removed. Continue taking this medication, and follow the directions you see here.   Moderna COVID-19 Bival Booster 50  MCG/0.5ML injection Generic drug: COVID-19 mRNA bivalent vaccine (Moderna)   Muscle Rub 10-15 % Crea Apply 1 application. topically 2 (two) times daily. What changed:  when to take this reasons to take this   nitroGLYCERIN 0.4 MG SL tablet Commonly known as: NITROSTAT DISSOLVE 1 TABLET UNDER THE TONGUE AT FIRST SIGN OF ATTACKS, MAY REPEAT EVERY 5 MINUTES UP TO 3 TABLETS, IF NO RELLIEF SEEK MEDICAL HELP What changed:  how much to take how to take this when to take this reasons to take this additional instructions   OT ULTRA/FASTTK CNTRL SOLN Soln   solifenacin 5 MG tablet Commonly known as: VESICARE Take 5 mg by mouth daily.   Vitamin D 50 MCG (2000 UT) tablet Take 2,000 Units by mouth daily.        Allergies  Allergen Reactions   Losartan Other (See Comments)    MD discontinued this because of decreased kidney function    Consultations: Neurology  Procedures/Studies: Overnight EEG with video  Result Date: 01/23/2022 Lora Havens, MD     01/23/2022 10:03 AM Patient Name: Rita Yoder MRN: [id number] Epilepsy Attending: Lora Havens Referring Physician/Provider: Lorenza Chick, MD Duration: 01/22/2022 2306 to 01/23/2022 0909 Patient history: 81 year old female with transient speech difficulty.  EEG to evaluate for seizure. Level of alertness: Awake, asleep AEDs during EEG study: LEV Technical aspects: This EEG study was done with scalp electrodes positioned according to the 10-20 International system of electrode placement. Electrical activity was reviewed with band pass filter of 1-'70Hz'$ , sensitivity of 7 uV/mm, display speed of 43m/sec with a '60Hz'$  notched filter applied as appropriate. EEG data were recorded continuously and digitally stored.  Video monitoring was available and reviewed as appropriate. Description: The posterior dominant rhythm consists of 8 Hz activity of moderate voltage (25-35 uV) seen predominantly in posterior head regions,  asymmetric ( left<right) and reactive to eye opening and eye closing. Sleep was characterized by vertex waves, sleep spindles (12 to 14 Hz), maximal frontocentral region. EEG showed continuous 3 to 6 Hz theta-delta slowing in left temporo-parietal region.  Sharp wave was noted in left temporo-parietal region region.  Hyperventilation and photic stimulation were not performed.   ABNORMALITY - Sharp wave, left temporo-parietal region - Continuous slow, left temporo-parietal region IMPRESSION: This study is suggestive of epileptogenicity and cortical dysfunction arising from left temporo-parietal region likely secondary to underlying stroke. No seizures  were seen throughout the recording. PLora Havens  MR BRAIN WO CONTRAST  Result Date: 01/22/2022 CLINICAL DATA:  Transient ischemic attack. EXAM: MRI HEAD WITHOUT CONTRAST TECHNIQUE: Multiplanar, multiecho pulse sequences of the brain and surrounding structures were obtained without intravenous contrast. COMPARISON:  MRI of the brain April 23, 2021. FINDINGS: Brain: Area of restricted diffusion involving the cortex and white matter of the left parietal lobe measuring approximately 3 cm, consistent with acute/subacute infarct. This is superimposed in an area of cortical infarct seen on prior MRI of the performed in February 2023. No hemorrhagic transformation. Tissue loss with surrounding gliosis and laminar cortical necrosis is seen in the left parietal lobe related to prior infarct. No acute hemorrhage, hydrocephalus, mass or midline shift. Foci of susceptibility artifact are seen in the right parietooccipital region, suggesting hemosiderin  deposit from prior subarachnoid hemorrhage. Scattered and confluent foci of T2 hyperintensity are seen within the white matter of the cerebral hemispheres and within the pons, nonspecific, most likely related to chronic small vessel ischemia. Vascular: Normal flow voids. Skull and upper cervical spine: Normal marrow  signal. Sinuses/Orbits: Mucous retention cyst in the right maxillary sinus. Bilateral lens surgery Other: None. IMPRESSION: 1. Acute/subacute infarct in the left parietal lobe, superimposed on an area of chronic infarct. 2. Advanced chronic microvascular ischemic changes of the white matter. 3. Hemosiderin deposit in the right parietooccipital region, suggesting hemosiderin deposit from prior subarachnoid hemorrhage. These results were called by telephone at the time of interpretation on 01/22/2022 at 4:37 pm to provider ABIGAIL HARRIS , who verbally acknowledged these results. Electronically Signed   By: Pedro Earls M.D.   On: 01/22/2022 16:37     Subjective: No acute issues or events overnight   Discharge Exam: Vitals:   01/23/22 1130 01/23/22 1200  BP:  128/81  Pulse: (!) 57 (!) 56  Resp:  18  Temp:    SpO2: 100% 100%   Vitals:   01/23/22 1100 01/23/22 1115 01/23/22 1130 01/23/22 1200  BP:    128/81  Pulse: (!) 59 (!) 58 (!) 57 (!) 56  Resp:    18  Temp:      TempSrc:      SpO2: 100% 100% 100% 100%  Weight:        General: Pt is alert, awake, not in acute distress Cardiovascular: RRR, S1/S2 +, no rubs, no gallops Respiratory: CTA bilaterally, no wheezing, no rhonchi Abdominal: Soft, NT, ND, bowel sounds + Extremities: no edema, no cyanosis    The results of significant diagnostics from this hospitalization (including imaging, microbiology, ancillary and laboratory) are listed below for reference.     Microbiology: No results found for this or any previous visit (from the past 240 hour(s)).   Labs: BNP (last 3 results) No results for input(s): "BNP" in the last 8760 hours. Basic Metabolic Panel: Recent Labs  Lab 01/22/22 1424 01/22/22 1510 01/23/22 0655  NA 142 142 138  K 4.3 4.3 4.3  CL 113* 113* 112*  CO2 18*  --  18*  GLUCOSE 116* 111* 85  BUN 46* 42* 43*  CREATININE 3.55* 3.70* 3.13*  CALCIUM 9.1  --  8.7*  MG  --   --  1.9  PHOS  --    --  3.0   Liver Function Tests: Recent Labs  Lab 01/22/22 1424 01/23/22 0655  AST 21 18  ALT 20 20  ALKPHOS 85 76  BILITOT 0.7 0.4  PROT 6.8 6.0*  ALBUMIN 3.7 3.1*   No results for input(s): "LIPASE", "AMYLASE" in the last 168 hours. No results for input(s): "AMMONIA" in the last 168 hours. CBC: Recent Labs  Lab 01/22/22 1424 01/22/22 1510 01/23/22 0655  WBC 11.7*  --  11.9*  NEUTROABS 8.1*  --  8.5*  HGB 9.9* 10.2* 9.3*  HCT 31.9* 30.0* 28.2*  MCV 103.6*  --  99.6  PLT 177  --  163   Cardiac Enzymes: No results for input(s): "CKTOTAL", "CKMB", "CKMBINDEX", "TROPONINI" in the last 168 hours. BNP: Invalid input(s): "POCBNP" CBG: No results for input(s): "GLUCAP" in the last 168 hours. D-Dimer No results for input(s): "DDIMER" in the last 72 hours. Hgb A1c No results for input(s): "HGBA1C" in the last 72 hours. Lipid Profile No results for input(s): "CHOL", "HDL", "LDLCALC", "TRIG", "CHOLHDL", "LDLDIRECT" in the last  72 hours. Thyroid function studies No results for input(s): "TSH", "T4TOTAL", "T3FREE", "THYROIDAB" in the last 72 hours.  Invalid input(s): "FREET3" Anemia work up No results for input(s): "VITAMINB12", "FOLATE", "FERRITIN", "TIBC", "IRON", "RETICCTPCT" in the last 72 hours. Urinalysis    Component Value Date/Time   COLORURINE YELLOW 05/01/2021 1945   APPEARANCEUR CLOUDY (A) 05/01/2021 1945   LABSPEC 1.013 05/01/2021 1945   PHURINE 6.0 05/01/2021 1945   GLUCOSEU NEGATIVE 05/01/2021 1945   HGBUR SMALL (A) 05/01/2021 1945   BILIRUBINUR NEGATIVE 05/01/2021 1945   BILIRUBINUR Negative 02/22/2020 1716   KETONESUR NEGATIVE 05/01/2021 1945   PROTEINUR 100 (A) 05/01/2021 1945   UROBILINOGEN 0.2 02/22/2020 1716   NITRITE NEGATIVE 05/01/2021 1945   LEUKOCYTESUR LARGE (A) 05/01/2021 1945   Sepsis Labs Recent Labs  Lab 01/22/22 1424 01/23/22 0655  WBC 11.7* 11.9*   Microbiology No results found for this or any previous visit (from the past 240  hour(s)).   Time coordinating discharge: Over 30 minutes  SIGNED:   Little Ishikawa, DO [facility] 01/23/2022, 1:27 PM Pager   If 7PM-7AM, please contact night-coverage [email or web address]

## 2022-01-23 NOTE — Progress Notes (Signed)
MB performed maintenance on electrodes. Not all impedances are below 10k ohms. No skin breakdown noted however Pt has patches of glue from Wig when it was removed causing high impedance electrodes.

## 2022-01-23 NOTE — Evaluation (Addendum)
Clinical/Bedside Swallow Evaluation Patient Details  Name: Rita Yoder MRN: [id number] Date of Birth: 09-18-1940  Today's Date: 01/23/2022 Time: SLP Start Time (ACUTE ONLY): 0933 SLP Stop Time (ACUTE ONLY): 6387 SLP Time Calculation (min) (ACUTE ONLY): 13 min  Past Medical History:  Past Medical History:  Diagnosis Date   Chronic kidney disease 12/2009   Diabetes mellitus 2010   Dyslipidemia    Hypertension    Monoclonal gammopathy 03/2010   Past Surgical History:  Past Surgical History:  Procedure Laterality Date   CATARACT EXTRACTION Bilateral 12/2020   CHOLECYSTECTOMY     HERNIA REPAIR     ROTATOR CUFF REPAIR  03/10/1992   THYROID SURGERY     TONSILLECTOMY     TOTAL ABDOMINAL HYSTERECTOMY     HPI:  Rita Yoder is a 81 y.o. female admitted 01/22/22 with facial droop, speech arrest, difficulty with ADL. MRI revealed infarct L parietal lobe, superimposed on chronic infarct. 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 Pt with history of SLP cognitive linguistic intervention s/p stroke 04/2021.    Assessment / Plan / Recommendation  Clinical Impression  Pt participated in clinical swallow evaluation and presents with a functional oropharyngeal swallow at bedside. Pt reports no prior difficulty swallowing, or increased coughing with PO intake. Oral motor assessment was [facility], however, pt is edentulous. Pt reports she has dentures but only wears them occasionally for PO intake. Pt tolerated trials of thin liquids, puree and regular consistency well, no s/sx of dysphagia/aspiration noted throughout. Mastication of regular consistency was adequate but slightly prolonged due to edentulous state. No oral residue noted. Recommend pt continue regular consistency/thin liquid diet, medications whole with liquids. Given absence  of s/sx of dysphagia/aspiration noted, no SLP intervention recommended. SLP Visit Diagnosis: Dysphagia, unspecified (R13.10)    Aspiration Risk  No limitations    Diet Recommendation Regular;Thin liquid   Liquid Administration via: Straw;Cup Medication Administration: Whole meds with liquid Supervision: Patient able to self feed Compensations: Small sips/bites;Slow rate Postural Changes: Seated upright at 90 degrees    Other  Recommendations Oral Care Recommendations: Oral care BID    Recommendations for follow up therapy are one component of a multi-disciplinary discharge planning process, led by the attending physician.  Recommendations may be updated based on patient status, additional functional criteria and insurance authorization.  Follow up Recommendations No SLP follow up      Assistance Recommended at Discharge    Functional Status Assessment Patient has not had a recent decline in their functional status  Frequency and Duration            Prognosis        Swallow Study   General Date of Onset: 01/22/22 HPI: Rita Yoder is a 81 y.o. female admitted 01/22/22 with facial droop, speech arrest, difficulty with ADL. MRI revealed infarct L parietal lobe, superimposed on chronic infarct. 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 Pt with history of SLP cognitive linguistic intervention s/p stroke 04/2021. Type of Study: Bedside Swallow Evaluation Previous Swallow Assessment:  (none) Diet Prior to this Study: Regular;Thin liquids Temperature Spikes Noted: No Respiratory Status: Room air History of Recent Intubation: No Behavior/Cognition: Alert;Cooperative;Pleasant mood Oral Cavity Assessment: Within Functional Limits Oral Care Completed by SLP: No Oral Cavity - Dentition: Edentulous Vision: Functional for  self-feeding Self-Feeding  Abilities: Able to feed self Patient Positioning: Upright in bed Baseline Vocal Quality: Normal Volitional Cough: Strong Volitional Swallow: Able to elicit    Oral/Motor/Sensory Function Overall Oral Motor/Sensory Function: Within functional limits   Ice Chips Ice chips: Not tested   Thin Liquid Thin Liquid: Within functional limits Presentation: Straw    Nectar Thick Nectar Thick Liquid: Not tested   Honey Thick Honey Thick Liquid: Not tested   Puree Puree: Within functional limits Presentation: Self Fed;Spoon   Solid     Solid: Within functional limits Presentation: [place] Student SLP  01/23/2022,11:47 AM

## 2022-01-23 NOTE — ED Notes (Signed)
[employer] tech at [employer]

## 2022-01-23 NOTE — TOC Initial Note (Addendum)
Transition of Care [facility]) - Initial/Assessment Note    Patient Details  Name: Rita Yoder MRN: [id number] Date of Birth: 1940-10-02  Transition of Care [facility]) CM/SW Contact:    Verdell Carmine, RN Phone Number: 01/23/2022, 3:29 PM  Clinical Narrative:                  Patient is discharged, PT recommended Home health. Patient has had [facility] previously. Message [facility] rep Reinaldo Raddle for acceptance. HH orders are in reached out to son [facility] and left a message to return call to discuss HH, OBS notice  Expected Discharge Plan: [place] Barriers to Discharge: No Barriers Identified   Patient Goals and CMS Choice      Home with home Health  Expected Discharge Plan and Services Expected Discharge Plan: [place] Acute Care Choice: Home Health   Expected Discharge Date: 01/23/22                         HH Arranged: PT HH Agency: [facility] [place] Date [place] Agency Contacted: 01/23/22 Time [place] Agency Contacted: 8 Representative spoke with at [place]: Reinaldo Raddle  Prior Living Arrangements/Services     Patient language and need for interpreter reviewed:: Yes        Need for Family Participation in Patient Care: Yes (Comment) Care giver support system in place?: Yes (comment)   Criminal Activity/Legal Involvement Pertinent to Current Situation/Hospitalization: No - Comment as needed  Activities of Daily Living      Permission Sought/Granted                  Emotional Assessment         Alcohol / Substance Use: Not Applicable Psych Involvement: No (comment)  Admission diagnosis:  CVA (cerebral vascular accident) [facility]) [I63.9] Patient Active Problem List   Diagnosis Date Noted   CVA (cerebral vascular accident) ([place]) 01/22/2022   AKI (acute kidney injury) ([place])    Right-sided visual neglect    Hyperglycemia    Subcortical infarction ([place]) 05/02/2021   Stroke ([place]) 04/24/2021   Stroke  (cerebrum) ([place]) 04/23/2021   Chronic kidney disease, stage IV (severe) ([place]) 08/26/2020   Weight loss 08/26/2020   Abnormal thyroid blood test 08/26/2020   Left hip pain 08/26/2020   Atherosclerosis of aorta ([place]) 08/20/2020   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   Ecchymoses, spontaneous 06/14/2019   Class 3 severe obesity due to excess calories with serious comorbidity and body mass index (BMI) of 45.0 to 49.9 in adult ([place]) 06/14/2019   Diabetic polyneuropathy associated with type 2 diabetes mellitus ([place]) 01/06/2019   BMI 40.0-44.9, adult ([place]) 01/06/2019   Essential hypertension 11/25/2018   Mixed hyperlipidemia 11/25/2018   Precordial pain 11/25/2018   CKD (chronic kidney disease), symptom management only, stage 3 (moderate) ([place]) 04/20/2013   PCP:  Glendale Chard, MD Pharmacy:   [facility] [place], [address] [place] [address] Phone: [phone] Fax: [phone]  [facility] [address] [place] [address] Phone: [phone] Fax: [place], [place] [address] [place] [address] Phone: [phone] Fax: [phone]     Social Determinants of Health (SDOH) Interventions    Readmission Risk  Interventions     No data to display

## 2022-01-23 NOTE — Progress Notes (Signed)
OT Cancellation Note  Patient Details Name: Rita Yoder MRN: [id number] DOB: 1940-06-11   Cancelled Treatment:    Reason Eval/Treat Not Completed: Patient at procedure or test/ unavailable. OT will continue to follow for evaluation as schedule allows  Jaci Carrel 01/23/2022, 11:07 AM  Jesse Sans OTR/L [place] Office: [phone]

## 2022-01-23 NOTE — Progress Notes (Signed)
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 Of note, patient has missed doses of her Eliquis this week due to delay in mail arrival with a total of 4 missed doses Monday and Tuesday.   Subjective: No acute overnight events noted. Patient is resting comfortably in ER stretcher on assessment.  LTM monitoring in place, epileptogenicity and cortical dysfunction arising from the left temoro-parietal region likely secondary to underlying stroke, no seizures were seen throughout the recording.   Exam: Vitals:   01/23/22 0100 01/23/22 0345  BP: (!) 166/64 (!) 151/72  Pulse: 67 65  Resp: 18 18  Temp:  98.9 F (37.2 C)  SpO2: 97% 100%   Gen: In bed, in no acute distress Resp: non-labored breathing, no acute distress, on room air Abd: soft, non-tender, non-distended  Neuro: Mental Status: Asleep initially, wakes easily to voice. She is able to correctly state her name, age, and current month. On repetition, she consistently omits the first 1/2 of the phrase. She has some hesitancy with naming objects though she is able to name 6/6 objects correctly.  She does appear to have some confusion as she reports no missed doses of Eliquis at home and patient's daughter reports multiple days of missed doses this week.  Patient follows simple commands without difficulty.  Cranial Nerves: PERRL, EOMI, tracks examiner throughout, face appears symmetric at rest and with movement, hearing is intact to voice, tongue is midline.  Motor: Bilateral upper extremities elevate antigravity without vertical drift with some less elevation of the right upper extremity. There is some minimal  weakness of the right upper extremity compared to the left with confrontational strength testing.  Patient is able to elevate the right lower extremity antigravity without vertical drift. The left lower extremity moves without gravity but is unable to elevate antigravity. Patient states she sometimes has trouble with her left lower extremity movement.  Sensory: Intact to light touch throughout Gait: Deferred  Pertinent Labs: CBC    Component Value Date/Time   WBC 11.9 (H) 01/23/2022 0655   RBC 2.83 (L) 01/23/2022 0655   HGB 9.3 (L) 01/23/2022 0655   HGB 9.6 (L) 06/13/2021 1657   HGB 10.6 (L) 10/18/2010 1000   HCT 28.2 (L) 01/23/2022 0655   HCT 29.3 (L) 06/13/2021 1657   HCT 33.3 (L) 10/18/2010 1000   PLT 163 01/23/2022 0655   PLT 254 06/13/2021 1657   MCV 99.6 01/23/2022 0655   MCV 98 (H) 06/13/2021 1657   MCV 99.4 10/18/2010 1000   MCH 32.9 01/23/2022 0655   MCHC 33.0 01/23/2022 0655   RDW 13.6 01/23/2022 0655   RDW 12.2 06/13/2021 1657   RDW 13.0 10/18/2010 1000   LYMPHSABS 2.2 01/23/2022 0655   LYMPHSABS 2.3 06/14/2019 1218   LYMPHSABS 2.4 10/18/2010 1000   MONOABS 1.0 01/23/2022 0655   MONOABS 0.4 10/18/2010 1000   EOSABS 0.1 01/23/2022 0655   EOSABS 0.2 06/14/2019 1218   BASOSABS 0.0 01/23/2022 0655   BASOSABS 0.1 06/14/2019 1218   BASOSABS 0.0 10/18/2010 1000   CMP     Component Value Date/Time   NA 138 01/23/2022 0655   NA 146 (H) 06/13/2021 1657   K 4.3 01/23/2022 1761  CL 112 (H) 01/23/2022 0655   CO2 18 (L) 01/23/2022 0655   GLUCOSE 85 01/23/2022 0655   BUN 43 (H) 01/23/2022 0655   BUN 38 (H) 06/13/2021 1657   CREATININE 3.13 (H) 01/23/2022 0655   CREATININE 2.85 (H) 03/25/2021 1354   CALCIUM 8.7 (L) 01/23/2022 0655   PROT 6.0 (L) 01/23/2022 0655   PROT 6.7 06/13/2021 1657   ALBUMIN 3.1 (L) 01/23/2022 0655   ALBUMIN 4.1 06/13/2021 1657   AST 18 01/23/2022 0655   AST 14 (L) 03/25/2021 1354   ALT 20 01/23/2022 0655   ALT 12 03/25/2021 1354    ALKPHOS 76 01/23/2022 0655   BILITOT 0.4 01/23/2022 0655   BILITOT 0.4 06/13/2021 1657   BILITOT 0.5 03/25/2021 1354   GFRNONAA 14 (L) 01/23/2022 0655   GFRNONAA 16 (L) 03/25/2021 1354   GFRAA 23 (L) 02/20/2020 1615   Urinalysis    Component Value Date/Time   COLORURINE YELLOW 05/01/2021 1945   APPEARANCEUR CLOUDY (A) 05/01/2021 1945   LABSPEC 1.013 05/01/2021 1945   PHURINE 6.0 05/01/2021 1945   GLUCOSEU NEGATIVE 05/01/2021 1945   HGBUR SMALL (A) 05/01/2021 1945   BILIRUBINUR NEGATIVE 05/01/2021 1945   BILIRUBINUR Negative 02/22/2020 1716   KETONESUR NEGATIVE 05/01/2021 1945   PROTEINUR 100 (A) 05/01/2021 1945   UROBILINOGEN 0.2 02/22/2020 1716   NITRITE NEGATIVE 05/01/2021 1945   LEUKOCYTESUR LARGE (A) 05/01/2021 1945   Lab Results  Component Value Date   HGBA1C 4.5 (L) 04/24/2021   Lab Results  Component Value Date   CHOL 130 04/24/2021   HDL 34 (L) 04/24/2021   LDLCALC 78 04/24/2021   TRIG 90 04/24/2021   CHOLHDL 3.8 04/24/2021  Alcohol Level    Component Value Date/Time   ETH <10 01/23/2022 0655   Estimated Creatinine Clearance: 15.5 mL/min (A) (by C-G formula based on SCr of 3.13 mg/dL (H)).  Imaging Reviewed:  MRI brain 11/15: 1. Acute/subacute infarct in the left parietal lobe, superimposed on an area of chronic infarct. 2. Advanced chronic microvascular ischemic changes of the white matter. 3. Hemosiderin deposit in the right parietooccipital region, suggesting hemosiderin deposit from prior subarachnoid hemorrhage.  EEG overnight 01/22/22 - 01/23/22: "This study is suggestive of epileptogenicity and cortical dysfunction arising from left temporo-parietal region likely secondary to underlying stroke. No seizures  were seen throughout the recording."  Assessment: 81 year old with recurrent stroke in the setting of several days of missed Eliquis dose.  On appearance, I favor more subacute timeline consistent with when she missed her Eliquis.  MRI brain with  acute/subacute infarct in the left parietal lobe, superimposed on an area of chronic infarct.  Given she has been tolerating Eliquis without issue, risk of stopping Eliquis is outweighed by the benefit of stopping Eliquis at this time and I would continue it. I am concerned that given her fluctuating symptoms breakthrough focal seizure may be playing a role in patient's presentation. LTM monitoring overnight with evidence of left tempo-parietal epileptogenicity and cortical dysfunction likely secondary to underlying stroke without seizures identified overnight.   Recommendations: - Discontinue LTM EEG monitoring  - Creatinine clearance 15.5, continue home Keppra 500 mg twice daily, maximized for renal function at this time:  - Seizure precautions  - A1c at goal - Lipid panel pending, continue home statin  - Continue home Eliquis -Follow-up with outpatient neurology in 4 to 6 weeks.  Discharge instructions: [facility] statutes, patients with seizures are not allowed to drive until they have been seizure-free for six  months Use caution when using heavy equipment or power tools. Avoid working on ladders or at heights. Take showers instead of baths. Ensure the water temperature is not too high on the home water heater. Do not go swimming alone. Do not lock yourself in a room alone (i.e. bathroom). When caring for infants or small children, sit down when holding, feeding, or changing them to minimize risk of injury to the child in the event you have a seizure. Maintain good sleep hygiene. Avoid alcohol.   Anibal Henderson, AGACNP-BC [facility] [phone]  Attending Neurohospitalist Addendum Patient seen and examined with APP/Resident. Agree with the history and physical as documented above. Agree with the plan as documented, which I helped formulate. I have independently reviewed the chart, obtained history, review of systems and examined the patient.I have personally reviewed  pertinent head/neck/spine imaging (CT/MRI).  Patient with a new acute to subacute stroke.  Currently optimized from a stroke standpoint.  Okay to continue home Eliquis.  Etiology of stroke likely discontinuation of anticoagulation.  Also has a history of seizures and the episode that brought her in was likely a small seizure in the setting of acute/subacute stroke.  She has been monitored on LTM EEG without any evidence of ongoing seizure activity.  LTM EEG can be discontinued.  She will need outpatient neurology follow-up.  Plan was discussed with Dr. Avon Gully.  Please feel free to call with any questions.  -- Amie Portland, MD Neurologist [facility] Pager: [phone]

## 2022-01-23 NOTE — Procedures (Addendum)
Patient Name: Rita Yoder  MRN: [id number]  Epilepsy Attending: Lora Havens  Referring Physician/Provider: Lorenza Chick, MD  Duration: 01/22/2022 2306 to 01/23/2022 0355  Patient history: 81 year old female with transient speech difficulty.  EEG to evaluate for seizure.  Level of alertness: Awake, asleep  AEDs during EEG study: LEV  Technical aspects: This EEG study was done with scalp electrodes positioned according to the 10-20 International system of electrode placement. Electrical activity was reviewed with band pass filter of 1-'70Hz'$ , sensitivity of 7 uV/mm, display speed of 49m/sec with a '60Hz'$  notched filter applied as appropriate. EEG data were recorded continuously and digitally stored.  Video monitoring was available and reviewed as appropriate.  Description: The posterior dominant rhythm consists of 8 Hz activity of moderate voltage (25-35 uV) seen predominantly in posterior head regions, asymmetric ( left<right) and reactive to eye opening and eye closing. Sleep was characterized by vertex waves, sleep spindles (12 to 14 Hz), maximal frontocentral region. EEG showed continuous 3 to 6 Hz theta-delta slowing in left temporo-parietal region.  Sharp wave was noted in left temporo-parietal region region.  Hyperventilation and photic stimulation were not performed.     ABNORMALITY - Sharp wave, left temporo-parietal region - Continuous slow, left temporo-parietal region  IMPRESSION: This study is suggestive of epileptogenicity and cortical dysfunction arising from left temporo-parietal region likely secondary to underlying stroke. No seizures  were seen throughout the recording.  Cadynce Garrette OBarbra Sarks

## 2022-01-24 ENCOUNTER — Telehealth: Payer: Self-pay

## 2022-01-24 NOTE — Telephone Encounter (Signed)
Transition Care Management Follow-up Telephone Call Date of discharge and from where: 01/22/2022 [place]  How have you been since you were released from the hospital? Pt son states she is doing better, she seems to be a little weak but she is doing good.  Any questions or concerns? No  Items Reviewed: Did the pt receive and understand the discharge instructions provided? Yes  Medications obtained and verified? Yes  Other? No  Any new allergies since your discharge? No  Dietary orders reviewed? Yes Do you have support at home? Yes   Home Care and Equipment/Supplies: Were home health services ordered? yes If so, what is the name of the agency? Patients son states he did receive a message from them, he has not listened yet. He will cal [place] back with the info once he knows.   Has the agency set up a time to come to the patient's home? no Were any new equipment or medical supplies ordered?  No What is the name of the medical supply agency? N/a Were you able to get the supplies/equipment? no Do you have any questions related to the use of the equipment or supplies? No  Functional Questionnaire: (I = Independent and D = Dependent) ADLs: I/d  Bathing/Dressing- d  Meal Prep- d  Eating- I/d  Maintaining continence- I/d  Transferring/Ambulation- I/d  Managing Meds- I/d  Follow up appointments reviewed:  PCP Hospital f/u appt confirmed? Yes  Scheduled to see robyn sanders on 02/06/2022 @ [address]. [facility] f/u appt confirmed? No  Scheduled to see n/a on n/a @ n/a. Are transportation arrangements needed? No  If their condition worsens, is the pt aware to call PCP or go to the Emergency Dept.? Yes Was the patient provided with contact information for the PCP's office or ED? Yes Was to pt encouraged to call back with questions or concerns? Yes

## 2022-01-24 NOTE — Telephone Encounter (Signed)
Transition Care Management Unsuccessful Follow-up Telephone Call  Date of discharge and from where:  01/23/2022 [place]   Attempts:  1st Attempt  Reason for unsuccessful TCM follow-up call:  Left voice message

## 2022-02-02 ENCOUNTER — Encounter: Payer: Self-pay | Admitting: Internal Medicine

## 2022-02-03 ENCOUNTER — Other Ambulatory Visit: Payer: Self-pay | Admitting: Internal Medicine

## 2022-02-04 ENCOUNTER — Ambulatory Visit (INDEPENDENT_AMBULATORY_CARE_PROVIDER_SITE_OTHER): Payer: Medicare Other | Admitting: Internal Medicine

## 2022-02-04 ENCOUNTER — Encounter: Payer: Self-pay | Admitting: Internal Medicine

## 2022-02-04 VITALS — BP 140/70 | HR 68 | Temp 98.4°F | Ht 66.0 in | Wt 195.0 lb

## 2022-02-04 DIAGNOSIS — Z23 Encounter for immunization: Secondary | ICD-10-CM | POA: Diagnosis not present

## 2022-02-04 DIAGNOSIS — I130 Hypertensive heart and chronic kidney disease with heart failure and stage 1 through stage 4 chronic kidney disease, or unspecified chronic kidney disease: Secondary | ICD-10-CM

## 2022-02-04 DIAGNOSIS — N1832 Chronic kidney disease, stage 3b: Secondary | ICD-10-CM | POA: Diagnosis not present

## 2022-02-04 DIAGNOSIS — D6869 Other thrombophilia: Secondary | ICD-10-CM

## 2022-02-04 DIAGNOSIS — I480 Paroxysmal atrial fibrillation: Secondary | ICD-10-CM | POA: Diagnosis not present

## 2022-02-04 DIAGNOSIS — I5032 Chronic diastolic (congestive) heart failure: Secondary | ICD-10-CM | POA: Diagnosis not present

## 2022-02-04 DIAGNOSIS — E1122 Type 2 diabetes mellitus with diabetic chronic kidney disease: Secondary | ICD-10-CM | POA: Diagnosis not present

## 2022-02-04 DIAGNOSIS — I669 Occlusion and stenosis of unspecified cerebral artery: Secondary | ICD-10-CM | POA: Diagnosis not present

## 2022-02-04 DIAGNOSIS — Z09 Encounter for follow-up examination after completed treatment for conditions other than malignant neoplasm: Secondary | ICD-10-CM

## 2022-02-04 MED ORDER — LEVOTHYROXINE SODIUM 50 MCG PO TABS: 50.0000 ug | ORAL_TABLET | Freq: Every day | ORAL | 1 refills | Status: DC

## 2022-02-04 NOTE — Progress Notes (Signed)
Rita Yoder,acting as a Education administrator for Rita Greenland, MD.,have documented all relevant documentation on the behalf of Rita Greenland, MD,as directed by  Rita Greenland, MD while in the presence of Rita Greenland, MD.    Subjective:     Patient ID: Rita Yoder , female    DOB: 05-06-1940 , 81 y.o.   MRN: [id number]   Chief Complaint  Patient presents with   Diabetes   Hypertension    HPI  Patient presents today for a hospital follow up, patient went to the hospital on 01/22/2022 for further evaluation of sx. She awakened on 11/15 feeling fine; however, shortly after being awake within 5 minutes had facial droop and had expressive aphasia.  Patient has a history of prior TIA and admission in February of this year.  The said symptoms lasted for minutes.  Since her previous stroke, she still  has a difficulty with gaze to the right side and also states that she has residual speech problems and trouble finding words. It appears she ran out of Eliquis a week prior to admission. She and son admit that they did not contact the office for refills/samples. She is on Eliquis due to h/o afib. MRI consistent with a new acute/subacute stroke in the same area as the one from back in February the left parietal lobe superimposed on an area of chronic infarct.  Pt was admitted per neuro hospitalistMedical team consulted for admission.  Patient's complete metabolic panel CO2 18 BUN 46 creatinine 3.5 her GFR 12 liver function tests were normal CBC mild leukocytosis 11.7 hemoglobin 9.9 platelets 177 patient's blood pressure here is normal at 111/67.  EKG was NSR. She was kept overnight and discharged on 01/23/22 in stable condition. She has not had any recurrence of sx since her discharge.  She has no specific concerns or complaints at this time.     BP Readings from Last 3 Encounters: 02/04/22 : (!) 140/70 01/23/22 : 132/70 01/22/22 : 138/60    Diabetes She presents for her follow-up diabetic  visit. She has type 2 diabetes mellitus. Her disease course has been stable. There are no hypoglycemic associated symptoms. There are no diabetic associated symptoms. Pertinent negatives for diabetes include no polydipsia, no polyphagia and no polyuria. Diabetic complications include nephropathy. Risk factors for coronary artery disease include post-menopausal, sedentary lifestyle and hypertension. Current diabetic treatment includes diet.  Hypertension This is a chronic problem. The current episode started more than 1 year ago. The problem has been gradually improving since onset. The problem is controlled. The current treatment provides moderate improvement. Hypertensive end-organ damage includes kidney disease.     Past Medical History:  Diagnosis Date   Chronic kidney disease 12/2009   Diabetes mellitus 2010   Dyslipidemia    Hypertension    Monoclonal gammopathy 03/2010     Family History  Problem Relation Age of Onset   Healthy Mother    Heart attack Brother    Cancer Maternal Aunt    Cancer Maternal Uncle    Heart Problems Maternal Grandmother      Current Outpatient Medications:    amiodarone (PACERONE) 200 MG tablet, Take 1/2 tablets (100 mg total) by mouth daily., Disp: 90 tablet, Rfl: 1   amLODipine (NORVASC) 5 MG tablet, Take 1 tablet (5 mg total) by mouth daily., Disp: 90 tablet, Rfl: 2   atorvastatin (LIPITOR) 40 MG tablet, TAKE 1 TABLET BY MOUTH DAILY, Disp: 100 tablet, Rfl: 2   Blood Glucose  Calibration (OT ULTRA/FASTTK CNTRL SOLN) SOLN, , Disp: , Rfl:    Cholecalciferol (VITAMIN D3) 50 MCG (2000 UT) TABS, Take 2,000 Units by mouth daily., Disp: 30 tablet, Rfl: 0   ELIQUIS 2.5 MG TABS tablet, TAKE 1 TABLET BY MOUTH TWICE  DAILY, Disp: 200 tablet, Rfl: 2   isosorbide mononitrate (IMDUR) 60 MG 24 hr tablet, TAKE 1 TABLET BY MOUTH DAILY, Disp: 100 tablet, Rfl: 2   levETIRAcetam (KEPPRA) 500 MG tablet, TAKE 1 TABLET BY MOUTH TWICE  DAILY, Disp: 100 tablet, Rfl: 2    Menthol-Methyl Salicylate (MUSCLE RUB) 10-15 % CREA, Apply 1 application. topically 2 (two) times daily. (Patient taking differently: Apply 1 application  topically as needed for muscle pain.), Disp: , Rfl: 0   MODERNA COVID-19 BIVAL BOOSTER 50 MCG/0.5ML injection, , Disp: , Rfl:    nitroGLYCERIN (NITROSTAT) 0.4 MG SL tablet, DISSOLVE 1 TABLET UNDER THE TONGUE AT FIRST SIGN OF ATTACKS, MAY REPEAT EVERY 5 MINUTES UP TO 3 TABLETS, IF NO RELLIEF SEEK MEDICAL HELP (Patient taking differently: Place 0.4 mg under the tongue every 5 (five) minutes x 3 doses as needed for chest pain (SEEK HELP, IF NO RELIEF).), Disp: 25 tablet, Rfl: 1   solifenacin (VESICARE) 5 MG tablet, Take 5 mg by mouth daily., Disp: , Rfl:    levothyroxine (SYNTHROID) 50 MCG tablet, TAKE 1 TABLET BY MOUTH DAILY, Disp: 100 tablet, Rfl: 1   levothyroxine (SYNTHROID) 50 MCG tablet, Take 1 tablet (50 mcg total) by mouth daily before breakfast., Disp: 90 tablet, Rfl: 1   Allergies  Allergen Reactions   Losartan Other (See Comments)    MD discontinued this because of decreased kidney function     Review of Systems  Constitutional: Negative.   HENT: Negative.    Eyes: Negative.   Respiratory: Negative.    Cardiovascular: Negative.   Gastrointestinal: Negative.   Endocrine: Negative for polydipsia, polyphagia and polyuria.     Today's Vitals   02/04/22 1027  BP: (!) 140/70  Pulse: 68  Temp: 98.4 F (36.9 C)  TempSrc: Oral  Weight: 195 lb (88.5 kg)  Height: '5\' 6"'$  (1.676 m)  PainSc: 0-No pain   Body mass index is 31.47 kg/m.  Wt Readings from Last 3 Encounters:  02/13/22 189 lb (85.7 kg)  02/04/22 195 lb (88.5 kg)  01/22/22 195 lb (88.5 kg)    Objective:  Physical Exam Vitals and nursing note reviewed.  Constitutional:      Appearance: Normal appearance.  HENT:     Head: Normocephalic and atraumatic.     Nose:     Comments: Masked     Mouth/Throat:     Comments: Masked Eyes:     Extraocular Movements:  Extraocular movements intact.  Cardiovascular:     Rate and Rhythm: Normal rate and regular rhythm.     Heart sounds: Normal heart sounds.  Pulmonary:     Effort: Pulmonary effort is normal.     Breath sounds: Normal breath sounds.  Musculoskeletal:     Cervical back: Normal range of motion.  Skin:    General: Skin is warm.  Neurological:     General: No focal deficit present.     Mental Status: She is alert.  Psychiatric:        Mood and Affect: Mood normal.        Behavior: Behavior normal.         Assessment And Plan:     1. Cerebral embolism with transient ischemic attack (TIA)  Comments: ER records reviewed.  She agrees to [facility] referral for med assistance/adherence. TCM PERFORMED. A MEMBER OF THE CLINICAL TEAM SPOKE WITH THE PATIENT UPON DISCHARGE. DISCHARGE SUMMARY WAS REVIEWED IN FULL DETAIL DURING THE VISIT. MEDS RECONCILED AND COMPARED TO DISCHARGE MEDS. MEDICATION LIST WAS UPDATED AND REVIEWED WITH THE PATIENT. GREATER THAN 50% FACE TO FACE TIME WAS SPENT IN COUNSELING AND COORDINATION OF CARE. ALL QUESTIONS WERE ANSWERED TO THE SATISFACTION OF THE PATIENT.   2. Type 2 diabetes mellitus with stage 3b chronic kidney disease, without long-term current use of insulin (HCC) Comments: Chronic, I will check labs as below. She has been able to control her diabetes with diet alone. - Hemoglobin A1c - AMB Referral to Chronic Care Management Services - Protein electrophoresis, serum - Parathyroid Hormone, Intact w/Ca - Phosphorus  3. 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 I will not change meds today. She is reminded that after having one stroke, she is at risk for second event.  4. Chronic diastolic heart failure ([place]) Comments: Chronic, she is encouraged to take meds as directed and follow a low sodium diet.  5. Paroxysmal atrial fibrillation (HCC) Comments: She is currently rate controlled and properly  anticoagulated. Importance of med/dietary/office visit compliance was d/w patient and her son.  6. Acquired thrombophilia ([place]) Comments: She is on Eliquis due to h/o afib.  7. Need for influenza vaccination - Flu Vaccine QUAD High Dose(Fluad)  8. Hospital discharge follow-up   Patient was given opportunity to ask questions. Patient verbalized understanding of the plan and was able to repeat key elements of the plan. All questions were answered to their satisfaction.   I, Rita Greenland, MD, have reviewed all documentation for this visit. The documentation on 02/04/22 for the exam, diagnosis, procedures, and orders are all accurate and complete.   IF YOU HAVE BEEN REFERRED TO A SPECIALIST, IT MAY TAKE 1-2 WEEKS TO SCHEDULE/PROCESS THE REFERRAL. IF YOU HAVE NOT HEARD FROM US/SPECIALIST IN TWO WEEKS, PLEASE GIVE [place] A CALL AT [phone].   THE PATIENT IS ENCOURAGED TO PRACTICE SOCIAL DISTANCING DUE TO THE COVID-19 PANDEMIC.

## 2022-02-04 NOTE — Patient Instructions (Signed)

## 2022-02-06 LAB — PROTEIN ELECTROPHORESIS, SERUM
A/G Ratio: 1.3 (ref 0.7–1.7)
Albumin ELP: 3.8 g/dL (ref 2.9–4.4)
Alpha 1: 0.2 g/dL (ref 0.0–0.4)
Alpha 2: 0.8 g/dL (ref 0.4–1.0)
Beta: 0.9 g/dL (ref 0.7–1.3)
Gamma Globulin: 0.9 g/dL (ref 0.4–1.8)
Globulin, Total: 2.9 g/dL (ref 2.2–3.9)
M-Spike, %: 0.3 g/dL — ABNORMAL HIGH
Total Protein: 6.7 g/dL (ref 6.0–8.5)

## 2022-02-06 LAB — HEMOGLOBIN A1C
Est. average glucose Bld gHb Est-mCnc: 97 mg/dL
Hgb A1c MFr Bld: 5 % (ref 4.8–5.6)

## 2022-02-06 LAB — PHOSPHORUS: Phosphorus: 3.4 mg/dL (ref 3.0–4.3)

## 2022-02-06 LAB — PTH, INTACT AND CALCIUM
Calcium: 9.3 mg/dL (ref 8.7–10.3)
PTH: 59 pg/mL (ref 15–65)

## 2022-02-11 ENCOUNTER — Encounter: Payer: Self-pay | Admitting: Internal Medicine

## 2022-02-13 ENCOUNTER — Ambulatory Visit (INDEPENDENT_AMBULATORY_CARE_PROVIDER_SITE_OTHER): Payer: Medicare Other

## 2022-02-13 VITALS — Ht 60.0 in | Wt 189.0 lb

## 2022-02-13 DIAGNOSIS — Z0000 Encounter for general adult medical examination without abnormal findings: Secondary | ICD-10-CM

## 2022-02-13 NOTE — Patient Instructions (Addendum)
[name] , Thank you for taking time to come for your Medicare Wellness Visit. I appreciate your ongoing commitment to your health goals. Please review the following plan we discussed and let me know if I can assist you in the future.   These are the goals we discussed:  Goals       Patient Stated      11/30/2019, wants to get back to normal      Patient Stated      12/20/2020, wants to get hip replacement      Patient Stated      02/13/2022, wants to work on writing and memory      Weight (lb) < 200 lb (90.7 kg) (pt-stated)      Needs to lose 20 pounds to have hip surgery      Weight (lb) < 200 lb (90.7 kg)      Wants to lose about 20 lbs        This is a list of the screening recommended for you and due dates:  Health Maintenance  Topic Date Due   DTaP/Tdap/Td vaccine (1 - Tdap) Never done   Zoster (Shingles) Vaccine (1 of 2) Never done   Eye exam for diabetics  02/11/2020   COVID-19 Vaccine (5 - 2023-24 season) 11/08/2021   Pneumonia Vaccine (1 - PCV) 02/28/2022*   Complete foot exam   02/28/2022   Yearly kidney health urinalysis for diabetes  06/20/2022   Hemoglobin A1C  08/05/2022   Yearly kidney function blood test for diabetes  01/24/2023   Medicare Annual Wellness Visit  02/14/2023   Flu Shot  Completed   DEXA scan (bone density measurement)  Completed   HPV Vaccine  Aged Out  *Topic was postponed. The date shown is not the original due date.    Advanced directives: Please bring a copy of your POA (Power of Attorney) and/or Living Will to your next appointment.   Conditions/risks identified: none  Next appointment: Follow up in one year for your annual wellness visit    Preventive Care 65 Years and Older, Female Preventive care refers to lifestyle choices and visits with your health care provider that can promote health and wellness. What does preventive care include? A yearly physical exam. This is also called an annual well check. Dental exams once or  twice a year. Routine eye exams. Ask your health care provider how often you should have your eyes checked. Personal lifestyle choices, including: Daily care of your teeth and gums. Regular physical activity. Eating a healthy diet. Avoiding tobacco and drug use. Limiting alcohol use. Practicing safe sex. Taking low-dose aspirin every day. Taking vitamin and mineral supplements as recommended by your health care provider. What happens during an annual well check? The services and screenings done by your health care provider during your annual well check will depend on your age, overall health, lifestyle risk factors, and family history of disease. Counseling  Your health care provider may ask you questions about your: Alcohol use. Tobacco use. Drug use. Emotional well-being. Home and relationship well-being. Sexual activity. Eating habits. History of falls. Memory and ability to understand (cognition). Work and work Statistician. Reproductive health. Screening  You may have the following tests or measurements: Height, weight, and BMI. Blood pressure. Lipid and cholesterol levels. These may be checked every 5 years, or more frequently if you are over 28 years old. Skin check. Lung cancer screening. You may have this screening every year starting at age 26 if  you have a 30-pack-year history of smoking and currently smoke or have quit within the past 15 years. Fecal occult blood test (FOBT) of the stool. You may have this test every year starting at age 81. Flexible sigmoidoscopy or colonoscopy. You may have a sigmoidoscopy every 5 years or a colonoscopy every 10 years starting at age 82. Hepatitis C blood test. Hepatitis B blood test. Sexually transmitted disease (STD) testing. Diabetes screening. This is done by checking your blood sugar (glucose) after you have not eaten for a while (fasting). You may have this done every 1-3 years. Bone density scan. This is done to screen for  osteoporosis. You may have this done starting at age 13. Mammogram. This may be done every 1-2 years. Talk to your health care provider about how often you should have regular mammograms. Talk with your health care provider about your test results, treatment options, and if necessary, the need for more tests. Vaccines  Your health care provider may recommend certain vaccines, such as: Influenza vaccine. This is recommended every year. Tetanus, diphtheria, and acellular pertussis (Tdap, Td) vaccine. You may need a Td booster every 10 years. Zoster vaccine. You may need this after age 61. Pneumococcal 13-valent conjugate (PCV13) vaccine. One dose is recommended after age 75. Pneumococcal polysaccharide (PPSV23) vaccine. One dose is recommended after age 34. Talk to your health care provider about which screenings and vaccines you need and how often you need them. This information is not intended to replace advice given to you by your health care provider. Make sure you discuss any questions you have with your health care provider. Document Released: 03/23/2015 Document Revised: 11/14/2015 Document Reviewed: 12/26/2014 Elsevier Interactive Patient Education  2017 [place] Prevention in the Home Falls can cause injuries. They can happen to people of all ages. There are many things you can do to make your home safe and to help prevent falls. What can I do on the outside of my home? Regularly fix the edges of walkways and driveways and fix any cracks. Remove anything that might make you trip as you walk through a door, such as a raised step or threshold. Trim any bushes or trees on the path to your home. Use bright outdoor lighting. Clear any walking paths of anything that might make someone trip, such as rocks or tools. Regularly check to see if handrails are loose or broken. Make sure that both sides of any steps have handrails. Any raised decks and porches should have guardrails on  the edges. Have any leaves, snow, or ice cleared regularly. Use sand or salt on walking paths during winter. Clean up any spills in your garage right away. This includes oil or grease spills. What can I do in the bathroom? Use night lights. Install grab bars by the toilet and in the tub and shower. Do not use towel bars as grab bars. Use non-skid mats or decals in the tub or shower. If you need to sit down in the shower, use a plastic, non-slip stool. Keep the floor dry. Clean up any water that spills on the floor as soon as it happens. Remove soap buildup in the tub or shower regularly. Attach bath mats securely with double-sided non-slip rug tape. Do not have throw rugs and other things on the floor that can make you trip. What can I do in the bedroom? Use night lights. Make sure that you have a light by your bed that is easy to reach. Do not  use any sheets or blankets that are too big for your bed. They should not hang down onto the floor. Have a firm chair that has side arms. You can use this for support while you get dressed. Do not have throw rugs and other things on the floor that can make you trip. What can I do in the kitchen? Clean up any spills right away. Avoid walking on wet floors. Keep items that you use a lot in easy-to-reach places. If you need to reach something above you, use a strong step stool that has a grab bar. Keep electrical cords out of the way. Do not use floor polish or wax that makes floors slippery. If you must use wax, use non-skid floor wax. Do not have throw rugs and other things on the floor that can make you trip. What can I do with my stairs? Do not leave any items on the stairs. Make sure that there are handrails on both sides of the stairs and use them. Fix handrails that are broken or loose. Make sure that handrails are as long as the stairways. Check any carpeting to make sure that it is firmly attached to the stairs. Fix any carpet that is loose  or worn. Avoid having throw rugs at the top or bottom of the stairs. If you do have throw rugs, attach them to the floor with carpet tape. Make sure that you have a light switch at the top of the stairs and the bottom of the stairs. If you do not have them, ask someone to add them for you. What else can I do to help prevent falls? Wear shoes that: Do not have high heels. Have rubber bottoms. Are comfortable and fit you well. Are closed at the toe. Do not wear sandals. If you use a stepladder: Make sure that it is fully opened. Do not climb a closed stepladder. Make sure that both sides of the stepladder are locked into place. Ask someone to hold it for you, if possible. Clearly mark and make sure that you can see: Any grab bars or handrails. First and last steps. Where the edge of each step is. Use tools that help you move around (mobility aids) if they are needed. These include: Canes. Walkers. Scooters. Crutches. Turn on the lights when you go into a dark area. Replace any light bulbs as soon as they burn out. Set up your furniture so you have a clear path. Avoid moving your furniture around. If any of your floors are uneven, fix them. If there are any pets around you, be aware of where they are. Review your medicines with your doctor. Some medicines can make you feel dizzy. This can increase your chance of falling. Ask your doctor what other things that you can do to help prevent falls. This information is not intended to replace advice given to you by your health care provider. Make sure you discuss any questions you have with your health care provider. Document Released: 12/21/2008 Document Revised: 08/02/2015 Document Reviewed: 03/31/2014 Elsevier Interactive Patient Education  2017 [employer].

## 2022-02-13 NOTE — Progress Notes (Signed)
I connected with Arthor Captain today by telephone and verified that I am speaking with the correct person using two identifiers. Location patient: home Location provider: work Persons participating in the virtual visit: Mishal, Probert (daughter), Glenna Durand LPN.   I discussed the limitations, risks, security and privacy concerns of performing an evaluation and management service by telephone and the availability of in person appointments. I also discussed with the patient that there may be a patient responsible charge related to this service. The patient expressed understanding and verbally consented to this telephonic visit.    Interactive audio and video telecommunications were attempted between this provider and patient, however failed, due to patient having technical difficulties OR patient did not have access to video capability.  We continued and completed visit with audio only.     Vital signs may be patient reported or missing.  Subjective:   Rita Yoder is a 81 y.o. female who presents for Medicare Annual (Subsequent) preventive examination.  Review of Systems     Cardiac Risk Factors include: advanced age (>57mn, >>24women);diabetes mellitus;dyslipidemia;hypertension;obesity (BMI >30kg/m2)     Objective:    Today's Vitals   02/13/22 1557  Weight: 189 lb (85.7 kg)  Height: 5' (1.524 m)   Body mass index is 36.91 kg/m.     02/13/2022    4:04 PM 08/15/2021    2:11 PM 08/12/2021   10:26 AM 05/02/2021   12:15 PM 04/24/2021    8:00 PM 12/20/2020   12:17 PM 11/30/2019    2:27 PM  Advanced Directives  Does Patient Have a Medical Advance Directive? Yes Yes Yes Yes No Yes Yes  Type of AParamedicof ADoniphanLiving will HBroadd[place]Living will [place]hat[place]Living will Healthcare Power of [place]alvest[place]Living will HKey[place]Living will  Does patient want to make  changes to medical advance directive?  No - Patient declined       Copy of HG[place]boin Chart? No - copy requested     No - copy requested No - copy requested  Would patient like information on creating a medical advance directive?     No - Patient declined      Current Medications (verified) Outpatient Encounter Medications as of 02/13/2022  Medication Sig   amiodarone (PACERONE) 200 MG tablet Take 1/2 tablets (100 mg total) by mouth daily.   amLODipine (NORVASC) 5 MG tablet Take 1 tablet (5 mg total) by mouth daily.   atorvastatin (LIPITOR) 40 MG tablet TAKE 1 TABLET BY MOUTH DAILY   Blood Glucose Calibration (OT ULTRA/FASTTK CNTRL SOLN) SOLN    Cholecalciferol (VITAMIN D3) 50 MCG (2000 UT) TABS Take 2,000 Units by mouth daily.   ELIQUIS 2.5 MG TABS tablet TAKE 1 TABLET BY MOUTH TWICE  DAILY   isosorbide mononitrate (IMDUR) 60 MG 24 hr tablet TAKE 1 TABLET BY MOUTH DAILY   levETIRAcetam (KEPPRA) 500 MG tablet TAKE 1 TABLET BY MOUTH TWICE  DAILY   levothyroxine (SYNTHROID) 50 MCG tablet TAKE 1 TABLET BY MOUTH DAILY   levothyroxine (SYNTHROID) 50 MCG tablet Take 1 tablet (50 mcg total) by mouth daily before breakfast.   Menthol-Methyl Salicylate (MUSCLE RUB) 10-15 % CREA Apply 1 application. topically 2 (two) times daily. (Patient taking differently: Apply 1 application  topically as needed for muscle pain.)   nitroGLYCERIN (NITROSTAT) 0.4 MG SL tablet DISSOLVE 1 TABLET UNDER THE TONGUE AT FIRST SIGN OF ATTACKS, MAY  REPEAT EVERY 5 MINUTES UP TO 3 TABLETS, IF NO RELLIEF SEEK MEDICAL HELP (Patient taking differently: Place 0.4 mg under the tongue every 5 (five) minutes x 3 doses as needed for chest pain (SEEK HELP, IF NO RELIEF).)   solifenacin (VESICARE) 5 MG tablet Take 5 mg by mouth daily.   MODERNA COVID-19 BIVAL BOOSTER 50 MCG/0.5ML injection    No facility-administered encounter medications on file as of 02/13/2022.    Allergies (verified) Losartan   History: Past  Medical History:  Diagnosis Date   Chronic kidney disease 12/2009   Diabetes mellitus 2010   Dyslipidemia    Hypertension    Monoclonal gammopathy 03/2010   Past Surgical History:  Procedure Laterality Date   CATARACT EXTRACTION Bilateral 12/2020   CHOLECYSTECTOMY     HERNIA REPAIR     ROTATOR CUFF REPAIR  03/10/1992   THYROID SURGERY     TONSILLECTOMY     TOTAL ABDOMINAL HYSTERECTOMY     Family History  Problem Relation Age of Onset   Healthy Mother    Heart attack Brother    Cancer Maternal Aunt    Cancer Maternal Uncle    Heart Problems Maternal Grandmother    Social History   Socioeconomic History   Marital status: Married    Spouse name: Not on file   Number of children: 4   Years of education: Not on file   Highest education level: Not on file  Occupational History   Occupation: retrired  Tobacco Use   Smoking status: Never   Smokeless tobacco: Never  Vaping Use   Vaping Use: Never used  Substance and Sexual Activity   Alcohol use: No   Drug use: Never   Sexual activity: Not Currently  Other Topics Concern   Not on file  Social History Narrative   Not on file   Social Determinants of Health   Financial Resource Strain: Low Risk  (02/13/2022)   Overall Financial Resource Strain (CARDIA)    Difficulty of Paying Living Expenses: Not hard at all  Food Insecurity: No Food Insecurity (02/13/2022)   Hunger Vital Sign    Worried About Running Out of Food in the Last Year: Never true    Ran Out of Food in the Last Year: Never true  Transportation Needs: No Transportation Needs (02/13/2022)   PRAPARE - Hydrologist (Medical): No    Lack of Transportation (Non-Medical): No  Physical Activity: Insufficiently Active (02/13/2022)   Exercise Vital Sign    Days of Exercise per Week: 7 days    Minutes of Exercise per Session: 20 min  Stress: No Stress Concern Present (02/13/2022)   [place]    Feeling of Stress : Not at all  Social Connections: Not on file    Tobacco Counseling Counseling given: Not Answered   Clinical Intake:  Pre-visit preparation completed: Yes  Pain : No/denies pain     Nutritional Status: BMI > 30  Obese Nutritional Risks: None Diabetes: Yes  How often do you need to have someone help you when you read instructions, pamphlets, or other written materials from your doctor or pharmacy?: 1 - Never  Diabetic?yes Nutrition Risk Assessment:  Has the patient had any N/V/D within the last 2 months?  No  Does the patient have any non-healing wounds?  No  Has the patient had any unintentional weight loss or weight gain?  No   Diabetes:  Is the  patient diabetic?  Yes  If diabetic, was a CBG obtained today?  No  Did the patient bring in their glucometer from home?  No  How often do you monitor your CBG's? daily.   Financial Strains and Diabetes Management:  Are you having any financial strains with the device, your supplies or your medication? No .  Does the patient want to be seen by Chronic Care Management for management of their diabetes?  No  Would the patient like to be referred to a Nutritionist or for Diabetic Management?  No   Diabetic Exams:  Diabetic Eye Exam: Completed 11/2021 Diabetic Foot Exam: Completed 02/28/2021   Interpreter Needed?: No  Information entered by :: NAllen LPN   Activities of Daily Living    02/13/2022    4:05 PM 05/02/2021   12:15 PM  In your present state of health, do you have any difficulty performing the following activities:  Hearing? 0 0  Vision? 0 0  Difficulty concentrating or making decisions? 1 0  Walking or climbing stairs? 0 1  Dressing or bathing? 0 1  Comment needs help with bra and washing back   Doing errands, shopping? 1 1  Preparing Food and eating ? Y   Using the Toilet? N   In the past six months, have you accidently leaked urine? N   Do you have problems  with loss of bowel control? N   Managing your Medications? Y   Managing your Finances? Y   Housekeeping or managing your Housekeeping? Y     Patient Care Team: Glendale Chard, MD as PCP - General (Internal Medicine) [facility] [place] (Pediatric Ophthalmology) Elsie Saas, MD as Consulting Physician (Orthopedic Surgery)  Indicate any recent Medical Services you may have received from other than [facility] providers in the past year (date may be approximate).     Assessment:   This is a routine wellness examination for Rita Yoder.  Hearing/Vision screen Vision Screening - Comments:: Regular eye exams, Dr. Clifton James  Dietary issues and exercise activities discussed: Current Exercise Habits: Home exercise routine, Type of exercise: walking, Time (Minutes): 20, Frequency (Times/Week): 7, Weekly Exercise (Minutes/Week): 140   Goals Addressed             This Visit's Progress    Patient Stated       02/13/2022, wants to work on writing and memory       Depression Screen    02/13/2022    4:05 PM 02/04/2022   10:25 AM 10/23/2021   10:57 AM 07/17/2021   10:40 AM 06/05/2021    9:12 AM 12/20/2020   12:19 PM 11/30/2019    2:30 PM  PHQ 2/9 Scores  PHQ - 2 Score 0 0 0 0 0 0 0  PHQ- 9 Score     4      Fall Risk    02/13/2022    4:04 PM 02/04/2022   10:19 AM 10/23/2021   10:56 AM 07/17/2021   10:40 AM 06/05/2021    9:09 AM  Fall Risk   Falls in the past year? 0 0 0 0 0  Number falls in past yr: 0 0 0 0 0  Injury with Fall? 0 0 0 0 0  Risk for fall due to : Impaired balance/gait;Impaired mobility;Medication side effect Impaired balance/gait;Impaired mobility     Follow up Falls prevention discussed;Falls evaluation completed;Education provided Falls evaluation completed       FALL RISK PREVENTION PERTAINING TO THE HOME:  Any stairs in or  around the home? No  If so, are there any without handrails?  N/a Home free of loose throw rugs in walkways, pet beds, electrical cords, etc? Yes   Adequate lighting in your home to reduce risk of falls? Yes   ASSISTIVE DEVICES UTILIZED TO PREVENT FALLS:  Life alert? No  Use of a cane, walker or w/c? Yes  Grab bars in the bathroom? Yes  Shower chair or bench in shower? Yes  Elevated toilet seat or a handicapped toilet? Yes   TIMED UP AND GO:  Was the test performed? No .       Cognitive Function:        02/13/2022    4:06 PM 12/20/2020   12:20 PM 11/30/2019    2:32 PM 11/18/2018    2:31 PM 01/20/2018   11:17 AM  6CIT Screen  What Year? 4 points 0 points 0 points 0 points 0 points  What month? 0 points 0 points 0 points 0 points 0 points  What time? 0 points 0 points 0 points 0 points 0 points  Count back from 20 4 points 0 points 2 points 0 points 0 points  Months in reverse 4 points 4 points 4 points 0 points 0 points  Repeat phrase 10 points 2 points 8 points 0 points 2 points  Total Score 22 points 6 points 14 points 0 points 2 points    Immunizations Immunization History  Administered Date(s) Administered   Fluad Quad(high Dose 65+) 01/07/2021, 02/04/2022   Moderna Covid-19 Vaccine Bivalent Booster 37yr & up 02/01/2021   PFIZER(Purple Top)SARS-COV-2 Vaccination 06/04/2019, 06/28/2019, 02/20/2020    TDAP status: Due, Education has been provided regarding the importance of this vaccine. Advised may receive this vaccine at local pharmacy or Health Dept. Aware to provide a copy of the vaccination record if obtained from local pharmacy or Health Dept. Verbalized acceptance and understanding.  Flu Vaccine status: Up to date  Pneumococcal vaccine status: Declined,  Education has been provided regarding the importance of this vaccine but patient still declined. Advised may receive this vaccine at local pharmacy or Health Dept. Aware to provide a copy of the vaccination record if obtained from local pharmacy or Health Dept. Verbalized acceptance and understanding.   Covid-19 vaccine status: Completed  vaccines  Qualifies for Shingles Vaccine? Yes   Zostavax completed No   Shingrix Completed?: No.    Education has been provided regarding the importance of this vaccine. Patient has been advised to call insurance company to determine out of pocket expense if they have not yet received this vaccine. Advised may also receive vaccine at local pharmacy or Health Dept. Verbalized acceptance and understanding.  Screening Tests Health Maintenance  Topic Date Due   DTaP/Tdap/Td (1 - Tdap) Never done   Zoster Vaccines- Shingrix (1 of 2) Never done   OPHTHALMOLOGY EXAM  02/11/2020   COVID-19 Vaccine (5 - 2023-24 season) 11/08/2021   Medicare Annual Wellness (AWV)  12/20/2021   Pneumonia Vaccine 81 Years old (1 - PCV) 02/28/2022 (Originally 01/16/2005)   FOOT EXAM  02/28/2022   Diabetic kidney evaluation - Urine ACR  06/20/2022   HEMOGLOBIN A1C  08/05/2022   Diabetic kidney evaluation - GFR measurement  01/24/2023   INFLUENZA VACCINE  Completed   DEXA SCAN  Completed   HPV VACCINES  Aged Out    Health Maintenance  Health Maintenance Due  Topic Date Due   DTaP/Tdap/Td (1 - Tdap) Never done   Zoster Vaccines- Shingrix (1 of 2)  Never done   OPHTHALMOLOGY EXAM  02/11/2020   COVID-19 Vaccine (5 - 2023-24 season) 11/08/2021   Medicare Annual Wellness (AWV)  12/20/2021    Colorectal cancer screening: No longer required.   Mammogram status: No longer required due to age.  Bone Density status: Completed 02/23/2017.  Lung Cancer Screening: (Low Dose CT Chest recommended if Age 53-80 years, 30 pack-year currently smoking OR have quit w/in 15years.) does not qualify.   Lung Cancer Screening Referral: no  Additional Screening:  Hepatitis C Screening: does not qualify;   Vision Screening: Recommended annual ophthalmology exams for early detection of glaucoma and other disorders of the eye. Is the patient up to date with their annual eye exam?  Yes  Who is the provider or what is the name of  the office in which the patient attends annual eye exams? Dr. Clifton James If pt is not established with a provider, would they like to be referred to a provider to establish care? No .   Dental Screening: Recommended annual dental exams for proper oral hygiene  Community Resource Referral / Chronic Care Management: CRR required this visit?  No   CCM required this visit?  No      Plan:     I have personally reviewed and noted the following in the patient's chart:   Medical and social history Use of alcohol, tobacco or illicit drugs  Current medications and supplements including opioid prescriptions. Patient is not currently taking opioid prescriptions. Functional ability and status Nutritional status Physical activity Advanced directives List of other physicians Hospitalizations, surgeries, and ER visits in previous 12 months Vitals Screenings to include cognitive, depression, and falls Referrals and appointments  In addition, I have reviewed and discussed with patient certain preventive protocols, quality metrics, and best practice recommendations. A written personalized care plan for preventive services as well as general preventive health recommendations were provided to patient.     Kellie Simmering, LPN   [id number]   Nurse Notes: none  Due to this being a virtual visit, the after visit summary with patients personalized plan was offered to patient via mail or my-chart.  Patient would like to access on my-chart

## 2022-02-18 ENCOUNTER — Telehealth: Payer: Self-pay

## 2022-02-18 NOTE — Progress Notes (Unsigned)
  Care Management   Outreach Note  02/18/2022 Name: Rita Yoder MRN: [id number] DOB: Sep 29, 1940  An unsuccessful telephone outreach was attempted today to contact the patient about Chronic Care Management needs.    Follow Up Plan:  A HIPAA compliant phone message was left for the patient providing contact information and requesting a return call.  The care management team will reach out to the patient again over the next 5 days.  If patient returns call to provider office, please advise to call [place]* at [phone] Larsson, [place], [place] [address] Direct Dial: [phone] [email or web address]

## 2022-02-19 ENCOUNTER — Encounter: Payer: Self-pay | Admitting: Internal Medicine

## 2022-02-19 NOTE — Progress Notes (Signed)
  Chronic Care Management   Note  02/19/2022 Name: Rita Yoder MRN: [id number] DOB: 25-Apr-1940  Rita Yoder is a 81 y.o. year old female who is a primary care patient of Glendale Chard, MD. I reached out to [facility] by phone today in response to a referral sent by Rita Yoder Heart's PCP.  Rita Yoder  agreedto scheduling an appointment with the CCM Pharmacist   Follow up plan: Patient agreed to scheduled appointment with Pharmacist on 03/14/2022(date/time).   Rita Yoder, [place], [place] [address] Direct Dial: [phone] [email or web address]

## 2022-02-21 ENCOUNTER — Encounter: Payer: Self-pay | Admitting: Internal Medicine

## 2022-02-25 ENCOUNTER — Other Ambulatory Visit: Payer: Self-pay | Admitting: Internal Medicine

## 2022-03-12 ENCOUNTER — Telehealth: Payer: Self-pay

## 2022-03-12 NOTE — Progress Notes (Unsigned)
Reason for Encounter: Appointment Reminder  Contacted patient on 03/12/2022   Recent office visits:  02-13-2022 Kellie Simmering, LPN. Medicare wellness visit.  02-04-2022 Glendale Chard, MD. Flu vaccine given.  Recent consult visits:  12-23-2021 Donalee Citrin, AUD (Audiology). Audiological evaluation completed.  10-23-2021 Meredith Staggers, MD (Physical medicine/rehab). Follow up visit. Referral placed to Ophthalmology.  10-07-2021 Elease Etienne B, PT (PT). Physical therapy discharge.  10-07-2021 Marzetta Board, CCC-SLP (Speech pathology). Therapy for aphasia.  09-30-2021 Elease Etienne B, PT (PT). Physical therapy for gait and mobility.  09-30-2021 Marzetta Board, CCC-SLP (Speech pathology). Therapy for aphasia.  09-23-2021 Marzetta Board, CCC-SLP (Speech pathology). Therapy for aphasia.  09-23-2021 Marzetta Board, CCC-SLP (Speech pathology). Therapy for aphasia.  09-23-2021  Elease Etienne B, PT (PT). Physical therapy for gait and mobility.  09-16-2021 Elease Etienne B, PT (PT). Physical therapy for gait and mobility.  09-16-2021 Marzetta Board, CCC-SLP (Speech pathology). Therapy for aphasia.  09-09-2021 Elease Etienne B, PT (PT). Physical therapy for gait and mobility.  Hospital visits:  Medication Reconciliation was completed by comparing discharge summary, patient's EMR and Pharmacy list, and upon discussion with patient.  Admitted to the hospital on 01-22-2022 due to TIA Discharge date was 01-23-2022. Discharged from [facility]?Medications Started at [facility] Discharge:?? None  Medication Changes at Hospital Discharge: None  Medications Discontinued at Hospital Discharge: Patient reported not taking synthroid  Medications that remain the same after Hospital Discharge:??  -All other medications will remain the same.    Medications: Outpatient Encounter Medications as of 03/12/2022  Medication  Sig   amiodarone (PACERONE) 200 MG tablet Take 1/2 tablets (100 mg total) by mouth daily.   amLODipine (NORVASC) 5 MG tablet Take 1 tablet (5 mg total) by mouth daily.   atorvastatin (LIPITOR) 40 MG tablet TAKE 1 TABLET BY MOUTH DAILY   Blood Glucose Calibration (OT ULTRA/FASTTK CNTRL SOLN) SOLN    Cholecalciferol (VITAMIN D3) 50 MCG (2000 UT) TABS Take 2,000 Units by mouth daily.   ELIQUIS 2.5 MG TABS tablet TAKE 1 TABLET BY MOUTH TWICE  DAILY   isosorbide mononitrate (IMDUR) 60 MG 24 hr tablet TAKE 1 TABLET BY MOUTH DAILY   levETIRAcetam (KEPPRA) 500 MG tablet TAKE 1 TABLET BY MOUTH TWICE  DAILY   levothyroxine (SYNTHROID) 50 MCG tablet TAKE 1 TABLET BY MOUTH DAILY   levothyroxine (SYNTHROID) 50 MCG tablet Take 1 tablet (50 mcg total) by mouth daily before breakfast.   Menthol-Methyl Salicylate (MUSCLE RUB) 10-15 % CREA Apply 1 application. topically 2 (two) times daily. (Patient taking differently: Apply 1 application  topically as needed for muscle pain.)   MODERNA COVID-19 BIVAL BOOSTER 50 MCG/0.5ML injection    nitroGLYCERIN (NITROSTAT) 0.4 MG SL tablet DISSOLVE 1 TABLET UNDER THE TONGUE AT FIRST SIGN OF ATTACKS, MAY REPEAT EVERY 5 MINUTES UP TO 3 TABLETS, IF NO RELLIEF SEEK MEDICAL HELP (Patient taking differently: Place 0.4 mg under the tongue every 5 (five) minutes x 3 doses as needed for chest pain (SEEK HELP, IF NO RELIEF).)   solifenacin (VESICARE) 5 MG tablet Take 5 mg by mouth daily.   No facility-administered encounter medications on file as of 03/12/2022.   Lab Results  Component Value Date/Time   HGBA1C 5.0 02/04/2022 11:57 AM   HGBA1C 4.5 (L) 04/24/2021 02:58 AM   MICROALBUR 150 02/22/2020 05:17 PM   MICROALBUR 30 02/07/2019 02:35 PM    BP Readings from Last 3 Encounters:  02/04/22 Marland Kitchen)  140/70  01/23/22 132/70  01/22/22 138/60    {Iqunsuccessful:26444}  Patient contacted to confirm {visittype:27222} appointment with *** PharmD, on *** at ***.  Do you have any  problems getting your medications? {yes/no:20286} If yes what types of problems are you experiencing? {Problems:27223}  What is your top health concern you would like to discuss at your upcoming visit?   Have you seen any other providers since your last visit with PCP? {yes/no:20286}   03-12-2022: 1st attempt lvm with daughter  Star Rating Drugs:  Atorvastatin 40 mg- Last filled 03-11-2022 100 DS [facility]. Previous 12-31-2021 [facility].   Care Gaps: Annual wellness visit in last year? Yes Tdap overdue Shingrix overdue PNA overdue Covid booster overdue Yearly foot exam overdue   If Diabetic: Last eye exam / retinopathy screening:  Last diabetic foot exam:  Rita Yoder

## 2022-03-13 ENCOUNTER — Other Ambulatory Visit: Payer: Self-pay | Admitting: Internal Medicine

## 2022-03-13 DIAGNOSIS — I130 Hypertensive heart and chronic kidney disease with heart failure and stage 1 through stage 4 chronic kidney disease, or unspecified chronic kidney disease: Secondary | ICD-10-CM

## 2022-03-13 DIAGNOSIS — I480 Paroxysmal atrial fibrillation: Secondary | ICD-10-CM

## 2022-03-13 DIAGNOSIS — I5032 Chronic diastolic (congestive) heart failure: Secondary | ICD-10-CM

## 2022-03-13 DIAGNOSIS — N1832 Chronic kidney disease, stage 3b: Secondary | ICD-10-CM

## 2022-03-14 ENCOUNTER — Ambulatory Visit: Payer: Medicare Other

## 2022-03-14 MED ORDER — LEVOTHYROXINE SODIUM 50 MCG PO TABS: 50.0000 ug | ORAL_TABLET | Freq: Every day | ORAL | 1 refills | Status: DC

## 2022-03-14 NOTE — Progress Notes (Signed)
Care Management & Coordination Services Yoder Note  03/14/2022 Name:  Rita Yoder MRN:  [id number] DOB:  [birth year]  Summary: Patients daughter reports that her medications through mail order are not coming in on time.   Recommendations/Changes made from today's visit: Recommend patient use pill packaging for medications   Follow up plan: HC to follow up to determine if patient is eligible for pill packaging though Rita Yoder.    Subjective: Rita Yoder is an 82 y.o. year old female who is a primary patient of Rita Chard, MD.  The care coordination team was consulted for assistance with disease management and care coordination needs.  She is from [facility], she stayed in [place] and got married to Rita Yoder. Her daughter works as Primary school teacher for over 33 years. She has two daughters who are doing well. She reports that her mother is doing well. She is getting back to herself. Per daughter sometimes she can't remember her birthday or address but things from the past she remembers overall she is doing wonderful. Her best friend came over and talked with her for over three hours. 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 " She wears herself out". She is with her mom and then she goes to work and her brother also stays with Rita Yoder.   Engaged with patient by telephone for initial visit.  Patient Care Team: Rita Chard, MD as PCP - General (Internal Medicine) Rita Yoder (Pediatric Ophthalmology) Rita Saas, MD as Consulting Physician (Orthopedic Surgery)  Recent office visits:  02-13-2022 Rita Simmering, LPN. Medicare wellness visit.   02-04-2022 Rita Chard, MD. Flu vaccine given.   Recent consult visits:  12-23-2021 Rita Yoder, AUD (Audiology). Audiological evaluation completed.   10-23-2021 Rita Staggers, MD (Physical medicine/rehab). Follow up visit. Referral placed to Ophthalmology.   10-07-2021 Rita Yoder,  PT (PT). Physical therapy Yoder.   10-07-2021 Rita Yoder, CCC-SLP (Speech pathology). Therapy for aphasia.   09-30-2021 Rita Yoder, PT (PT). Physical therapy for gait and mobility.   09-30-2021 Rita Yoder, CCC-SLP (Speech pathology). Therapy for aphasia.   09-23-2021 Rita Yoder, CCC-SLP (Speech pathology). Therapy for aphasia.   09-23-2021 Rita Yoder, CCC-SLP (Speech pathology). Therapy for aphasia.   09-23-2021  Rita Yoder, PT (PT). Physical therapy for gait and mobility.   09-16-2021 Rita Yoder, PT (PT). Physical therapy for gait and mobility.   09-16-2021 Rita Yoder, CCC-SLP (Speech pathology). Therapy for aphasia.   09-09-2021 Rita Yoder, PT (PT). Physical therapy for gait and mobility.   Hospital visits:  Medication Reconciliation was completed by comparing Yoder summary, patient's EMR and Yoder list, and upon discussion with patient.   Admitted to the hospital on 01-22-2022 due to TIA Yoder date was 01-23-2022. Discharged from Rita Yoder:?? None   Medication Changes at Hospital Yoder: None   Medications Discontinued at Hospital Yoder: Patient reported not taking synthroid   Medications that remain the same after Hospital Yoder:??  -All other medications will remain the same.     Objective:  Lab Results  Component Value Date   CREATININE 3.13 (H) 01/23/2022   BUN 43 (H) 01/23/2022   EGFR 15 (L) 06/13/2021   GFRNONAA 14 (L) 01/23/2022   GFRAA 23 (L) 02/20/2020   NA 138 01/23/2022   K 4.3 01/23/2022   CALCIUM 9.3 02/04/2022   CO2 18 (L) 01/23/2022  GLUCOSE 85 01/23/2022    Lab Results  Component Value Date/Time   HGBA1C 5.0 02/04/2022 11:57 AM   HGBA1C 4.5 (L) 04/24/2021 02:58 AM   MICROALBUR 150 02/22/2020 05:17 PM   MICROALBUR 30 02/07/2019 02:35 PM    Last diabetic Eye exam:   Lab Results  Component Value Date/Time   HMDIABEYEEXA No Retinopathy 02/11/2019 12:00 AM    Last diabetic Foot exam: No results found for: "HMDIABFOOTEX"   Lab Results  Component Value Date   CHOL 130 04/24/2021   HDL 34 (L) 04/24/2021   LDLCALC 78 04/24/2021   TRIG 90 04/24/2021   CHOLHDL 3.8 04/24/2021       Latest Ref Rng & Units 02/04/2022   11:57 AM 01/23/2022    6:55 AM 01/22/2022    2:24 PM  Hepatic Function  Total Protein 6.0 - 8.5 g/dL 6.7  6.0  6.8   Albumin 3.5 - 5.0 g/dL  3.1  3.7   AST 15 - 41 U/L  18  21   ALT 0 - 44 U/L  20  20   Alk Phosphatase 38 - 126 U/L  76  85   Total Bilirubin 0.3 - 1.2 mg/dL  0.4  0.7     Lab Results  Component Value Date/Time   TSH 4.480 12/18/2021 04:01 PM   TSH 20.300 (H) 10/21/2021 03:13 PM   FREET4 1.29 08/28/2021 04:27 PM   FREET4 1.04 05/01/2021 08:20 AM       Latest Ref Rng & Units 01/23/2022    6:55 AM 01/22/2022    3:10 PM 01/22/2022    2:24 PM  CBC  WBC 4.0 - 10.5 K/uL 11.9   11.7   Hemoglobin 12.0 - 15.0 g/dL 9.3  10.2  9.9   Hematocrit 36.0 - 46.0 % 28.2  30.0  31.9   Platelets 150 - 400 K/uL 163   177     Lab Results  Component Value Date/Time   VITAMINB12 535 08/20/2020 12:31 PM   JYNWGNFA21 308 11/30/2019 03:40 PM    Clinical ASCVD: Yes  The ASCVD Risk score (Arnett DK, et al., 2019) failed to calculate for the following reasons:   The 2019 ASCVD risk score is only valid for ages 32 to 44   The patient has a prior MI or stroke diagnosis       02/13/2022    4:05 PM 02/04/2022   10:25 AM 10/23/2021   10:57 AM  Depression screen PHQ 2/9  Decreased Interest 0 0 0  Down, Depressed, Hopeless 0 0 0  PHQ - 2 Score 0 0 0      Social History   Tobacco Use  Smoking Status Never  Smokeless Tobacco Never   BP Readings from Last 3 Encounters:  02/04/22 (!) 140/70  01/23/22 132/70  01/22/22 138/60   Pulse Readings from Last 3 Encounters:  02/04/22 68  01/23/22 92  01/22/22 65   Wt Readings  from Last 3 Encounters:  02/13/22 189 lb (85.7 kg)  02/04/22 195 lb (88.5 kg)  01/22/22 195 lb (88.5 kg)   BMI Readings from Last 3 Encounters:  02/13/22 36.91 kg/m  02/04/22 31.47 kg/m  01/22/22 31.47 kg/m    Allergies  Allergen Reactions   Losartan Other (See Comments)    MD discontinued this because of decreased kidney function    Medications Reviewed Today     Reviewed by Rita Simmering, LPN (Licensed Practical Nurse) on 02/13/22 at 30  Med List Status: <None>  Medication Order Taking? Sig Documenting Provider Last Dose Status Informant  amiodarone (PACERONE) 200 MG tablet [id number] Yes Take 1/2 tablets (100 mg total) by mouth daily. Rita Chard, MD Taking Active Child, Yoder Records  amLODipine [facility]) 5 MG tablet [id number] Yes Take 1 tablet (5 mg total) by mouth daily. Rita Chard, MD Taking Active Child, Yoder Records  atorvastatin (LIPITOR) 40 MG tablet [id number] Yes TAKE 1 TABLET BY MOUTH DAILY Rita Chard, MD Taking Active Child, Yoder Records  Blood Glucose Calibration (OT ULTRA/FASTTK Ivar Drape [id number] Yes  [provider] Taking Active Child, Yoder Records           Med Note Duffy Bruce, Legrand Como   Tue Apr 23, 2021  6:01 PM)    Cholecalciferol (VITAMIN D3) 50 MCG (2000 UT) TABS [id number] Yes Take 2,000 Units by mouth daily. Setzer, Edman Circle, PA-C Taking Active Child, Yoder Records  ELIQUIS 2.5 MG TABS tablet [id number] Yes TAKE 1 TABLET BY MOUTH TWICE  DAILY Rita Chard, MD Taking Active Child, Yoder Records  isosorbide mononitrate (IMDUR) 60 MG 24 hr tablet [id number] Yes TAKE 1 TABLET BY MOUTH DAILY Rita Chard, MD Taking Active Child, Yoder Records  levETIRAcetam (KEPPRA) 500 MG tablet [id number] Yes TAKE 1 TABLET BY MOUTH TWICE  DAILY Rita Chard, MD Taking Active Child, Yoder Records  levothyroxine (SYNTHROID) 50 MCG tablet [id number] Yes TAKE 1 TABLET BY MOUTH DAILY Rita Chard, MD Taking Active    levothyroxine (SYNTHROID) 50 MCG tablet [id number] Yes Take 1 tablet (50 mcg total) by mouth daily before breakfast. Rita Chard, MD Taking Active   Menthol-Methyl Salicylate (MUSCLE RUB) 10-15 % CREA [id number] Yes Apply 1 application. topically 2 (two) times daily.  Patient taking differently: Apply 1 application  topically as needed for muscle pain.   Setzer, Edman Circle, PA-C Taking Active Child, Yoder Records  MODERNA COVID-19 BIVAL BOOSTER 50 MCG/0.5ML injection [id number]   [provider]  Active Child, Yoder Records  nitroGLYCERIN (NITROSTAT) 0.4 MG SL tablet [id number] Yes DISSOLVE 1 TABLET UNDER THE TONGUE AT FIRST SIGN OF ATTACKS, MAY REPEAT EVERY 5 MINUTES UP TO 3 TABLETS, IF NO RELLIEF SEEK MEDICAL HELP  Patient taking differently: Place 0.4 mg under the tongue every 5 (five) minutes x 3 doses as needed for chest pain (SEEK HELP, IF NO RELIEF).   Rita Chard, MD Taking Active Family Member, Child, Yoder Records  solifenacin (VESICARE) 5 MG tablet [id number] Yes Take 5 mg by mouth daily. [provider] Taking Active Family Member, Child, Yoder Records            Patient Active Problem List   Diagnosis Date Noted   CVA (cerebral vascular accident) ([place]) 01/22/2022   AKI (acute kidney injury) ([place])    Right-sided visual neglect    Hyperglycemia    Subcortical infarction ([place]) 05/02/2021   Stroke ([place]) 04/24/2021   Stroke (cerebrum) ([place]) 04/23/2021   Chronic kidney disease, stage IV (severe) ([place]) 08/26/2020   Weight loss 08/26/2020   Abnormal thyroid blood test 08/26/2020   Left hip pain 08/26/2020   Atherosclerosis of aorta ([place]) 08/20/2020   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   Ecchymoses, spontaneous 06/14/2019   Class 3 severe obesity due to excess calories with serious comorbidity and body mass index (BMI) of 45.0 to 49.9 in adult [facility]) 06/14/2019   Diabetic polyneuropathy associated with type 2 diabetes mellitus ([place]) 01/06/2019    BMI 40.0-44.9, adult ([place]) 01/06/2019   Essential hypertension 11/25/2018   Mixed hyperlipidemia 11/25/2018  Precordial pain 11/25/2018   CKD (chronic kidney disease), symptom management only, stage 3 (moderate) ([place]) 04/20/2013    Immunization History  Administered Date(s) Administered   Fluad Quad(high Dose 65+) 01/07/2021, 02/04/2022   Moderna Covid-19 Vaccine Bivalent Booster 65yr & up 02/01/2021   PFIZER(Purple Top)SARS-COV-2 Vaccination 06/04/2019, 06/28/2019, 02/20/2020     Compliance/Adherence/Medication fill history: Care Gaps: DTAP/Tdap/Td Shingrix Vaccine Pneumonia Vaccine COVID-19 Vaccine  Foot Exam  Star-Rating Drugs: Atorvastatin 40 mg tablet   SDOH:  (Social Determinants of Health) assessments and interventions performed: No SDOH Interventions    Flowsheet Row Clinical [facility] [place] [facility] [facility]  SDOH Interventions    Food Insecurity Interventions Intervention Not Indicated --  Transportation Interventions Intervention Not Indicated --  Depression Interventions/Treatment  -- PDGL8-7Score <4 Follow-up Not Indicated  Financial Strain Interventions Intervention Not Indicated --  Physical Activity Interventions Other (Comments) --  Stress Interventions Intervention Not Indicated --      SDOH Screenings   Food Insecurity: No Food Insecurity (02/13/2022)  Transportation Needs: No Transportation Needs (02/13/2022)  Depression (PHQ2-9): Low Risk  (02/13/2022)  Financial Resource Strain: Low Risk  (02/13/2022)  Physical Activity: Insufficiently Active (02/13/2022)  Stress: No Stress Concern Present (02/13/2022)  Tobacco Use: Low Risk  (02/13/2022)    Medication Access: Within the past 30 days, how often has patient missed a dose of medication? None - Ms. SIvin Bootygives Ms. HMcdonellher medication Is a pillbox or other method used to improve adherence? Yes  transportation problems, nonadherence to medications, visual impairment, and hearing impairment Factors that may affect medication adherence? transportation problems, visual impairment, and hearing impairment Are meds synced by current Yoder? No  Are meds delivered by current Yoder?  Some are through mail order and there are other [facility] experience delays in picking up medications due to transportation concerns? No   Rita Services Reviewed: Is patient disadvantaged to use Rita Yoder?:  Uncertain Current Rx [facility] Name and location of Current [facility]:  [facility] [address] [place]Phone: [phone]: [phone] MZ[facility]arias PontesTransitions of Care Yoder [address]Phone: [phone]: [address] [address]Phone: [phone]: [phone] Rita Yoder services reviewed with patient today?: Yes  Patient requests to transfer care to Rita Yoder?: Yes    Assessment/Plan   Hypertension (BP goal <130/80) -Controlled -Current treatment: Amlodipine 5 mg tablet once per day Appropriate, Effective, Safe, Accessible Isosorbide 60 mg tablet once per day Appropriate, Effective, Safe, Accessible -Current home readings: 124/77 readings at home -Current dietary habits: Ms. SIvin Bootymonitors what her mother is eating.  -Current exercise habits: she is very busy  -Denies hypotensive/hypertensive symptoms -Educated on Symptoms of hypotension and importance of maintaining adequate hydration; -Counseled to monitor BP at home once per day, document, and provide log at future appointments -Recommended to continue current  medication   . VOrlando Penner CPP, PharmD Clinical Pharmacist Practitioner [facility] [phone]

## 2022-03-17 ENCOUNTER — Ambulatory Visit: Payer: Medicare Other | Admitting: Adult Health

## 2022-03-17 ENCOUNTER — Encounter: Payer: Self-pay | Admitting: Adult Health

## 2022-03-17 VITALS — BP 129/58 | HR 59 | Ht 59.75 in | Wt 186.0 lb

## 2022-03-17 DIAGNOSIS — R569 Unspecified convulsions: Secondary | ICD-10-CM | POA: Diagnosis not present

## 2022-03-17 DIAGNOSIS — I63412 Cerebral infarction due to embolism of left middle cerebral artery: Secondary | ICD-10-CM

## 2022-03-17 NOTE — Progress Notes (Signed)
PATIENT: Rita Yoder DOB: 1940/10/31  REASON FOR VISIT: follow up HISTORY FROM: patient PRIMARY NEUROLOGIST: Dr. Leonie Man  Chief Complaint  Patient presents with   Rm 18    Patient is here with her son, Rita Yoder, for stroke follow-up. She had an ER visit recently on 01/22/22 for TIA. She has been fine since then and has seen primary care. She denies any falls within the past year. Had a seizure when she was in the hospital in November.     HISTORY OF PRESENT ILLNESS: Today 03/17/22  Rita Yoder is a 82 y.o. female who has been followed in this office for stroke. Returns today for follow-up.  She reports that in November she went to the hospital for facial droop and slurred speech.  She had been off of Eliquis for several days due to having trouble getting up in the mail order pharmacy.  She states that since then she has not had any additional strokelike symptoms.  Her son reports that they are getting her medicine through a local mail order pharmacy.  They have not had any trouble getting her medication.  PCP is managing blood pressure cholesterol and diabetes.  She states that she has not had any seizure events.  Remains on Keppra   01/23/22: ( Copied from Hospital note) Rita Yoder is a 82 y.o. female with medical history significant for prior CVA in February 2023, paroxysmal A-fib on Eliquis, essential hypertension, hyperlipidemia, hypothyroidism, CKD 4, HFpEF 53-66% grade 1 diastolic dysfunction, history of subarachnoid hemorrhage on Keppra, who presented to [facility] ED from home for facial droop and slurred speech.     Patient admitted as above with concern over acute stroke given recurrent episodes of slurred speech and facial droop.  MRI did confirm an acute to subacute infarct of the left parietal lobe.  Patient has had issues getting her medications filled via mail order and subsequently has missed multiple doses of Eliquis likely the etiology for her acute stroke in  the setting of paroxysmal A-fib.  Neurology evaluated, EEG unremarkable, otherwise patient stable and agreeable for discharge home.  No medication changes, discussed the importance of medication compliance, if she were to run out of medications again via mail order she can always call PCP and get local refills for emergency use without incurring extra costs.  Family and patient was unaware this was an option.  Follow-up with PCP in the next 1 to 2 weeks for routine evaluation post Hospital care.  Continue home health PT, discussed increasing activity levels as well as improvement in diet and lifestyle.   HISTORY Rita Yoder is a 82 year old pleasant African-American lady seen today for initial office follow-up visit following hospital admission for stroke in February 2023.  She is accompanied by her daughter today.  History is obtained from them and review of electronic medical records.  I have personally reviewed pertinent available imaging films in PACS.  She has past medical history of diabetes, hypertension, chronic kidney disease who presented on 04/23/2021 with word finding difficulties and right arm weakness and tingling.  In the waiting room she got worse with gaze deviation and not speaking and so code stroke was activated.  Her symptoms resolved by the time she was seen and returned from the [place].  Blood pressure was found to be elevated at 440 systolic.  Noncontrast CT scan of the head showed hypodensity in the left thalamus compatible with age-indeterminate infarct however MRI scan showed small patchy acute cortical  and subcortical infarcts in the left parietal lobe with changes of small vessel disease.  There was also changes of remote age subarachnoid hemorrhage microhemorrhage in the right parieto-occipital lobe raising question for possible amyloid angiopathy.  MR angiogram of the brain showed no large vessel stenosis or occlusion.  2D echo showed ejection fraction of 60 to 65% with mild left  ventricular hypertrophy and and left atrial dilatation.  LDL cholesterol 78 mg percent.  Hemoglobin A1c is 4.5.  Patient was subsequently found to have be in atrial fibrillation.  She was on aspirin prior to admission and subsequently was switched to Eliquis 2.5 twice daily.  Patient had episode of right upper extremity twitching's which were diagnosed as focal seizure and she was started on Keppra.  EEG could not be completed as patient had a glued and weakness which could not be removed.  Patient states she is done well since discharge.  Her speech has improved though she still has some word hesitancy and has some good days and bad days with her speech.  Right upper extremity is improving but she still has significant weakness of right grip and intrinsic hand muscles and fine motor skills are diminished.  She is ambulating better but does use a wheeled walker all the times.  She has had no falls or injuries.  She is currently doing outpatient physical and speech therapy.  She has no new complaints today.   REVIEW OF SYSTEMS: Out of a complete 14 system review of symptoms, the patient complains only of the following symptoms, and all other reviewed systems are negative.  ALLERGIES: Allergies  Allergen Reactions   Losartan Other (See Comments)    MD discontinued this because of decreased kidney function    HOME MEDICATIONS: Outpatient Medications Prior to Visit  Medication Sig Dispense Refill   amiodarone (PACERONE) 200 MG tablet Take 1/2 tablets (100 mg total) by mouth daily. 90 tablet 1   amLODipine (NORVASC) 5 MG tablet TAKE 1 TABLET BY MOUTH DAILY 100 tablet 2   atorvastatin (LIPITOR) 40 MG tablet TAKE 1 TABLET BY MOUTH DAILY 100 tablet 2   Blood Glucose Calibration (OT ULTRA/FASTTK CNTRL SOLN) SOLN      Cholecalciferol (VITAMIN D3) 50 MCG (2000 UT) TABS Take 2,000 Units by mouth daily. 30 tablet 0   ELIQUIS 2.5 MG TABS tablet TAKE 1 TABLET BY MOUTH TWICE  DAILY 200 tablet 2   isosorbide  mononitrate (IMDUR) 60 MG 24 hr tablet TAKE 1 TABLET BY MOUTH DAILY 100 tablet 2   levETIRAcetam (KEPPRA) 500 MG tablet TAKE 1 TABLET BY MOUTH TWICE  DAILY 200 tablet 2   levothyroxine (SYNTHROID) 50 MCG tablet Take 1 tablet (50 mcg total) by mouth daily before breakfast. 90 tablet 1   Menthol-Methyl Salicylate (MUSCLE RUB) 10-15 % CREA Apply 1 application. topically 2 (two) times daily. (Patient taking differently: Apply 1 application  topically as needed for muscle pain.)  0   MODERNA COVID-19 BIVAL BOOSTER 50 MCG/0.5ML injection      nitroGLYCERIN (NITROSTAT) 0.4 MG SL tablet DISSOLVE 1 TABLET UNDER THE TONGUE AT FIRST SIGN OF ATTACKS, MAY REPEAT EVERY 5 MINUTES UP TO 3 TABLETS, IF NO RELLIEF SEEK MEDICAL HELP (Patient taking differently: Place 0.4 mg under the tongue every 5 (five) minutes x 3 doses as needed for chest pain (SEEK HELP, IF NO RELIEF).) 25 tablet 1   solifenacin (VESICARE) 5 MG tablet Take 5 mg by mouth daily.     No facility-administered medications prior to  visit.    PAST MEDICAL HISTORY: Past Medical History:  Diagnosis Date   Chronic kidney disease 12/2009   Diabetes mellitus 2010   Dyslipidemia    Hypertension    Monoclonal gammopathy 03/2010    PAST SURGICAL HISTORY: Past Surgical History:  Procedure Laterality Date   CATARACT EXTRACTION Bilateral 12/2020   CHOLECYSTECTOMY     HERNIA REPAIR     ROTATOR CUFF REPAIR  03/10/1992   THYROID SURGERY     TONSILLECTOMY     TOTAL ABDOMINAL HYSTERECTOMY      FAMILY HISTORY: Family History  Problem Relation Age of Onset   Healthy Mother    Heart attack Brother    Cancer Maternal Aunt    Cancer Maternal Uncle    Heart Problems Maternal Grandmother     SOCIAL HISTORY: Social History   Socioeconomic History   Marital status: Married    Spouse name: Not on file   Number of children: 4   Years of education: Not on file   Highest education level: Not on file  Occupational History   Occupation: retrired   Tobacco Use   Smoking status: Never   Smokeless tobacco: Never  Vaping Use   Vaping Use: Never used  Substance and Sexual Activity   Alcohol use: No   Drug use: Never   Sexual activity: Not Currently  Other Topics Concern   Not on file  Social History Narrative   Not on file   Social Determinants of Health   Financial Resource Strain: Low Risk  (02/13/2022)   Overall Financial Resource Strain (CARDIA)    Difficulty of Paying Living Expenses: Not hard at all  Food Insecurity: No Food Insecurity (02/13/2022)   Hunger Vital Sign    Worried About Running Out of Food in the Last Year: Never true    Ran Out of Food in the Last Year: Never true  Transportation Needs: No Transportation Needs (02/13/2022)   PRAPARE - Hydrologist (Medical): No    Lack of Transportation (Non-Medical): No  Physical Activity: Insufficiently Active (02/13/2022)   Exercise Vital Sign    Days of Exercise per Week: 7 days    Minutes of Exercise per Session: 20 min  Stress: No Stress Concern Present (02/13/2022)   [place]    Feeling of Stress : Not at all  Social Connections: Not on file  Intimate Partner Violence: Not At Risk (01/20/2018)   Humiliation, Afraid, Rape, and Kick questionnaire    Fear of Current or Ex-Partner: No    Emotionally Abused: No    Physically Abused: No    Sexually Abused: No      PHYSICAL EXAM  Vitals:   03/17/22 1330  BP: (!) 129/58  Pulse: (!) 59  Weight: 186 lb (84.4 kg)  Height: 4' 11.75" (1.518 m)   Body mass index is 36.63 kg/m.  Generalized: Well developed, in no acute distress   Neurological examination  Mentation: Alert oriented to time, place, history taking. Follows all commands speech and language fluent Cranial nerve II-XII: Pupils were equal round reactive to light. Extraocular movements were full, visual field were full on confrontational test. Facial  sensation and strength were normal.Head turning and shoulder shrug  were normal and symmetric. Motor: The motor testing reveals 5 over 5 strength of all 4 extremities. Good symmetric motor tone is noted throughout.  Sensory: Sensory testing is intact to soft touch on all 4 extremities. No  evidence of extinction is noted.  Coordination: Cerebellar testing reveals good finger-nose-finger and heel-to-shin bilaterally.  Gait and station: Gait is normal.  Reflexes: Deep tendon reflexes are symmetric and normal bilaterally.   DIAGNOSTIC DATA (LABS, IMAGING, TESTING) - I reviewed patient records, labs, notes, testing and imaging myself where available.  Lab Results  Component Value Date   WBC 11.9 (H) 01/23/2022   HGB 9.3 (L) 01/23/2022   HCT 28.2 (L) 01/23/2022   MCV 99.6 01/23/2022   PLT 163 01/23/2022      Component Value Date/Time   NA 138 01/23/2022 0655   NA 146 (H) 06/13/2021 1657   K 4.3 01/23/2022 0655   CL 112 (H) 01/23/2022 0655   CO2 18 (L) 01/23/2022 0655   GLUCOSE 85 01/23/2022 0655   BUN 43 (H) 01/23/2022 0655   BUN 38 (H) 06/13/2021 1657   CREATININE 3.13 (H) 01/23/2022 0655   CREATININE 2.85 (H) 03/25/2021 1354   CALCIUM 9.3 02/04/2022 1157   PROT 6.7 02/04/2022 1157   ALBUMIN 3.1 (L) 01/23/2022 0655   ALBUMIN 4.1 06/13/2021 1657   AST 18 01/23/2022 0655   AST 14 (L) 03/25/2021 1354   ALT 20 01/23/2022 0655   ALT 12 03/25/2021 1354   ALKPHOS 76 01/23/2022 0655   BILITOT 0.4 01/23/2022 0655   BILITOT 0.4 06/13/2021 1657   BILITOT 0.5 03/25/2021 1354   GFRNONAA 14 (L) 01/23/2022 0655   GFRNONAA 16 (L) 03/25/2021 1354   GFRAA 23 (L) 02/20/2020 1615   Lab Results  Component Value Date   CHOL 130 04/24/2021   HDL 34 (L) 04/24/2021   LDLCALC 78 04/24/2021   TRIG 90 04/24/2021   CHOLHDL 3.8 04/24/2021   Lab Results  Component Value Date   HGBA1C 5.0 02/04/2022   Lab Results  Component Value Date   CEYEMVVK12 244 08/20/2020   Lab Results  Component  Value Date   TSH 4.480 12/18/2021      ASSESSMENT AND PLAN 82 y.o. year old female  has a past medical history of Chronic kidney disease (12/2009), Diabetes mellitus (2010), Dyslipidemia, Hypertension, and Monoclonal gammopathy (03/2010). here with:  1.  Seizures  Continue Keppra 500 mg twice a day  2.  History of stroke  Continue Eliquis 2.5 mg twice a day Keep good control of blood pressure, cholesterol and diabetes   Follow-up in 1 year or sooner if needed     Ward Givens, MSN, NP-C 03/17/2022, 12:43 PM [facility] [address] [place], [place] [address] [phone]

## 2022-03-17 NOTE — Patient Instructions (Signed)
Your Plan:  Continue Eliquis   Continue Keppra  Blood pressure goal <130/90 Cholesterol LDL goal <70 Diabetes goal A1c <7 Monitor diet and try to exercise   Thank you for coming to see [place] at [facility]. I hope we have been able to provide you high quality care today.  You may receive a patient satisfaction survey over the next few weeks. We would appreciate your feedback and comments so that we may continue to improve ourselves and the health of our patients.

## 2022-03-31 ENCOUNTER — Ambulatory Visit: Payer: Medicare Other | Admitting: Podiatry

## 2022-03-31 ENCOUNTER — Encounter: Payer: Self-pay | Admitting: Podiatry

## 2022-03-31 VITALS — BP 120/68 | HR 81

## 2022-03-31 DIAGNOSIS — M79675 Pain in left toe(s): Secondary | ICD-10-CM | POA: Diagnosis not present

## 2022-03-31 DIAGNOSIS — M79674 Pain in right toe(s): Secondary | ICD-10-CM | POA: Diagnosis not present

## 2022-03-31 DIAGNOSIS — B351 Tinea unguium: Secondary | ICD-10-CM

## 2022-03-31 NOTE — Progress Notes (Signed)
Subjective:   Patient ID: Rita Yoder, female   DOB: 82 y.o.   MRN: [id number]   HPI Patient presents with painful nailbeds 1-5 both feet that she cannot currently take care of and are difficult for her to wear shoe gear with   ROS      Objective:  Physical Exam  Skin there are status intact with thick yellow brittle nailbeds 1-5 both feet painful     Assessment:  Chronic mycotic nail infection with pain 1-5 both feet     Plan:  Debridement of painful nailbeds 1-5 both feet no iatrogenic bleeding reappoint routine care

## 2022-04-08 DIAGNOSIS — I480 Paroxysmal atrial fibrillation: Secondary | ICD-10-CM | POA: Diagnosis not present

## 2022-04-08 DIAGNOSIS — I10 Essential (primary) hypertension: Secondary | ICD-10-CM | POA: Diagnosis not present

## 2022-04-08 DIAGNOSIS — I639 Cerebral infarction, unspecified: Secondary | ICD-10-CM | POA: Diagnosis not present

## 2022-04-08 DIAGNOSIS — I2510 Atherosclerotic heart disease of native coronary artery without angina pectoris: Secondary | ICD-10-CM | POA: Diagnosis not present

## 2022-06-13 ENCOUNTER — Other Ambulatory Visit: Payer: Self-pay | Admitting: Internal Medicine

## 2022-08-11 ENCOUNTER — Encounter: Payer: Self-pay | Admitting: Internal Medicine

## 2022-08-11 ENCOUNTER — Ambulatory Visit (INDEPENDENT_AMBULATORY_CARE_PROVIDER_SITE_OTHER): Payer: Medicare Other | Admitting: Internal Medicine

## 2022-08-11 VITALS — BP 118/72 | HR 58 | Temp 97.9°F | Ht 59.0 in | Wt 186.6 lb

## 2022-08-11 DIAGNOSIS — E039 Hypothyroidism, unspecified: Secondary | ICD-10-CM | POA: Diagnosis not present

## 2022-08-11 DIAGNOSIS — Z0001 Encounter for general adult medical examination with abnormal findings: Secondary | ICD-10-CM

## 2022-08-11 DIAGNOSIS — N1832 Chronic kidney disease, stage 3b: Secondary | ICD-10-CM | POA: Diagnosis not present

## 2022-08-11 DIAGNOSIS — E1122 Type 2 diabetes mellitus with diabetic chronic kidney disease: Secondary | ICD-10-CM | POA: Diagnosis not present

## 2022-08-11 DIAGNOSIS — I5032 Chronic diastolic (congestive) heart failure: Secondary | ICD-10-CM | POA: Diagnosis not present

## 2022-08-11 DIAGNOSIS — Z6837 Body mass index (BMI) 37.0-37.9, adult: Secondary | ICD-10-CM

## 2022-08-11 DIAGNOSIS — H6123 Impacted cerumen, bilateral: Secondary | ICD-10-CM | POA: Diagnosis not present

## 2022-08-11 DIAGNOSIS — D6869 Other thrombophilia: Secondary | ICD-10-CM | POA: Diagnosis not present

## 2022-08-11 DIAGNOSIS — Z0000 Encounter for general adult medical examination without abnormal findings: Secondary | ICD-10-CM

## 2022-08-11 DIAGNOSIS — I480 Paroxysmal atrial fibrillation: Secondary | ICD-10-CM | POA: Diagnosis not present

## 2022-08-11 DIAGNOSIS — I130 Hypertensive heart and chronic kidney disease with heart failure and stage 1 through stage 4 chronic kidney disease, or unspecified chronic kidney disease: Secondary | ICD-10-CM | POA: Diagnosis not present

## 2022-08-11 DIAGNOSIS — I669 Occlusion and stenosis of unspecified cerebral artery: Secondary | ICD-10-CM

## 2022-08-11 NOTE — Progress Notes (Signed)
I,Victoria T Hamilton,acting as a scribe for [name], MD.,have documented all relevant documentation on the behalf of [name], MD,as directed by  [name], MD while in the presence of [name], MD. w  Subjective:     Patient ID: Rita Yoder , female    DOB: 1940-03-28 , 82 y.o.   MRN: [id number]   Chief Complaint  Patient presents with   Annual Exam   Diabetes   Hypertension    HPI  She is here today for a full physical exam. Accompanied by her daughter. She reports compliance with meds. She has been able to control her diabetes with dietary changes. She denies headaches, chest pain and shortness of breath. She has no specific concerns or complaints at this time. Her daughter does not have any concerns at this present time. She states the patient has a good appetite and that she is sleeping well.   Diabetes She presents for her follow-up diabetic visit. She has type 2 diabetes mellitus. Her disease course has been stable. There are no hypoglycemic associated symptoms. There are no diabetic associated symptoms. Diabetic complications include nephropathy. Risk factors for coronary artery disease include post-menopausal, sedentary lifestyle and hypertension. Current diabetic treatment includes diet.  Hypertension This is a chronic problem. The current episode started more than 1 year ago. The problem has been gradually improving since onset. The problem is controlled. The current treatment provides moderate improvement. Hypertensive end-organ damage includes kidney disease.     Past Medical History:  Diagnosis Date   Chronic kidney disease 12/2009   Diabetes mellitus 2010   Dyslipidemia    Hypertension    Monoclonal gammopathy 03/2010     Family History  Problem Relation Age of Onset   Healthy Mother    Heart attack Brother    Heart Problems Maternal Grandmother    Stroke Maternal Grandmother    Cancer Maternal Aunt    Stroke Maternal Aunt     Cancer Maternal Uncle      Current Outpatient Medications:    amiodarone (PACERONE) 200 MG tablet, TAKE ONE-HALF TABLET BY MOUTH  DAILY, Disp: 50 tablet, Rfl: 2   amLODipine (NORVASC) 5 MG tablet, TAKE 1 TABLET BY MOUTH DAILY, Disp: 100 tablet, Rfl: 2   atorvastatin (LIPITOR) 40 MG tablet, TAKE 1 TABLET BY MOUTH DAILY, Disp: 100 tablet, Rfl: 2   Blood Glucose Calibration (OT ULTRA/FASTTK CNTRL SOLN) SOLN, , Disp: , Rfl:    Cholecalciferol (VITAMIN D3) 50 MCG (2000 UT) TABS, Take 2,000 Units by mouth daily., Disp: 30 tablet, Rfl: 0   ELIQUIS 2.5 MG TABS tablet, TAKE 1 TABLET BY MOUTH TWICE  DAILY, Disp: 200 tablet, Rfl: 2   isosorbide mononitrate (IMDUR) 60 MG 24 hr tablet, TAKE 1 TABLET BY MOUTH DAILY, Disp: 100 tablet, Rfl: 2   levETIRAcetam (KEPPRA) 500 MG tablet, TAKE 1 TABLET BY MOUTH TWICE  DAILY, Disp: 200 tablet, Rfl: 2   levothyroxine (SYNTHROID) 50 MCG tablet, Take 1 tablet (50 mcg total) by mouth daily before breakfast., Disp: 90 tablet, Rfl: 1   Menthol-Methyl Salicylate (MUSCLE RUB) 10-15 % CREA, Apply 1 application. topically 2 (two) times daily. (Patient taking differently: Apply 1 application  topically as needed for muscle pain.), Disp: , Rfl: 0   MODERNA COVID-19 BIVAL BOOSTER 50 MCG/0.5ML injection, , Disp: , Rfl:    nitroGLYCERIN (NITROSTAT) 0.4 MG SL tablet, DISSOLVE 1 TABLET UNDER THE TONGUE AT FIRST SIGN OF ATTACKS, MAY REPEAT EVERY 5 MINUTES  UP TO 3 TABLETS, IF NO RELLIEF SEEK MEDICAL HELP (Patient taking differently: Place 0.4 mg under the tongue every 5 (five) minutes x 3 doses as needed for chest pain (SEEK HELP, IF NO RELIEF).), Disp: 25 tablet, Rfl: 1   solifenacin (VESICARE) 5 MG tablet, Take 5 mg by mouth daily., Disp: , Rfl:    Allergies  Allergen Reactions   Losartan Other (See Comments)    MD discontinued this because of decreased kidney function      The patient states she uses post menopausal status for birth control. Last LMP was No LMP recorded.  (Menstrual status: Other).. Negative for Dysmenorrhea. Negative for: breast discharge, breast lump(s), breast pain and breast self exam. Associated symptoms include abnormal vaginal bleeding. Pertinent negatives include abnormal bleeding (hematology), anxiety, decreased libido, depression, difficulty falling sleep, dyspareunia, history of infertility, nocturia, sexual dysfunction, sleep disturbances, urinary incontinence, urinary urgency, vaginal discharge and vaginal itching. Diet regular.The patient states her exercise level is  intermittent.  . The patient's tobacco use is:  Social History   Tobacco Use  Smoking Status Never  Smokeless Tobacco Never  . She has been exposed to passive smoke. The patient's alcohol use is:  Social History   Substance and Sexual Activity  Alcohol Use No    Review of Systems  Constitutional: Negative.   HENT: Negative.    Eyes: Negative.   Respiratory: Negative.    Cardiovascular: Negative.   Gastrointestinal: Negative.   Endocrine: Negative.   Genitourinary: Negative.   Musculoskeletal: Negative.        Daughter states she had a fall Mother's Day, tripped over her foot. Admits she was not using her walker at the time. She jumped up to hug her grandson and tripped over her feet. States she did not suffer any injuries.   Skin: Negative.   Allergic/Immunologic: Negative.   Neurological: Negative.   Hematological: Negative.   Psychiatric/Behavioral: Negative.       Today's Vitals   08/11/22 1107  BP: 118/72  Pulse: (!) 58  Temp: 97.9 F (36.6 C)  SpO2: 98%  Weight: 186 lb 9.6 oz (84.6 kg)  Height: 4\' 11"  (1.499 m)   Body mass index is 37.69 kg/m.  Wt Readings from Last 3 Encounters:  08/11/22 186 lb 9.6 oz (84.6 kg)  03/17/22 186 lb (84.4 kg)  02/13/22 189 lb (85.7 kg)    Objective:  Physical Exam Vitals and nursing note reviewed.  Constitutional:      Appearance: Normal appearance. She is obese.     Comments: Examined while in chair   HENT:     Head: Normocephalic and atraumatic.     Right Ear: Ear canal and external ear normal. There is impacted cerumen.     Left Ear: Ear canal and external ear normal. There is impacted cerumen.  Eyes:     Extraocular Movements: Extraocular movements intact.  Cardiovascular:     Rate and Rhythm: Normal rate and regular rhythm.     Pulses:          Dorsalis pedis pulses are 1+ on the right side and 1+ on the left side.     Heart sounds: Normal heart sounds.  Pulmonary:     Effort: Pulmonary effort is normal.     Breath sounds: Normal breath sounds.  Chest:  Breasts:    Tanner Score is 5.     Right: Normal.     Left: Normal.     Comments: Pendulous Abdominal:  General: Bowel sounds are normal.     Palpations: Abdomen is soft.     Comments: Obese, soft. Difficult to assess organomegaly  Genitourinary:    Comments: Deferred  Musculoskeletal:        General: Normal range of motion.     Cervical back: Normal range of motion.     Comments: Ambulatory w/ walker  Feet:     Right foot:     Protective Sensation: 5 sites tested.  5 sites sensed.     Skin integrity: Dry skin present.     Toenail Condition: Right toenails are long.     Left foot:     Protective Sensation: 5 sites tested.  5 sites sensed.     Skin integrity: Dry skin present.     Toenail Condition: Left toenails are long.  Skin:    General: Skin is warm.  Neurological:     General: No focal deficit present.     Mental Status: She is alert.  Psychiatric:        Mood and Affect: Mood normal.        Behavior: Behavior normal.     Assessment And Plan:     1. Encounter for annual health examination Comments: Exam performed. Importance of monthly self breast exams was discussed with the patient.  PATIENT IS ADVISED TO GET 30-45 MINUTES REGULAR EXERCISE NO LESS THAN FOUR TO FIVE DAYS PER WEEK - BOTH WEIGHTBEARING EXERCISES AND AEROBIC ARE RECOMMENDED.  PATIENT IS ADVISED TO FOLLOW A HEALTHY DIET WITH AT LEAST  SIX FRUITS/VEGGIES PER DAY, DECREASE INTAKE OF RED MEAT, AND TO INCREASE FISH INTAKE TO TWO DAYS PER WEEK.  MEATS/FISH SHOULD NOT BE FRIED, BAKED OR BROILED IS PREFERABLE.  IT IS ALSO IMPORTANT TO CUT BACK ON YOUR SUGAR INTAKE. PLEASE AVOID ANYTHING WITH ADDED SUGAR, CORN SYRUP OR OTHER SWEETENERS. IF YOU MUST USE A SWEETENER, YOU CAN TRY STEVIA. IT IS ALSO IMPORTANT TO AVOID ARTIFICIALLY SWEETENERS AND DIET BEVERAGES. LASTLY, I SUGGEST WEARING SPF 50 SUNSCREEN ON EXPOSED PARTS AND ESPECIALLY WHEN IN THE DIRECT SUNLIGHT FOR AN EXTENDED PERIOD OF TIME.  PLEASE AVOID FAST FOOD RESTAURANTS AND INCREASE YOUR WATER INTAKE.  2. Type 2 diabetes mellitus with stage 3b chronic kidney disease, without long-term current use of insulin (HCC) Comments: Chronic, diabetic foot exam was performed.  She is controlled without meds. She will f/u in 4-6 months for re-evaluation.  I DISCUSSED WITH THE PATIENT AT LENGTH REGARDING THE GOALS OF GLYCEMIC CONTROL AND POSSIBLE LONG-TERM COMPLICATIONS.  I  ALSO STRESSED THE IMPORTANCE OF COMPLIANCE WITH HOME GLUCOSE MONITORING, DIETARY RESTRICTIONS INCLUDING AVOIDANCE OF SUGARY DRINKS/PROCESSED FOODS,  ALONG WITH REGULAR EXERCISE.  I  ALSO STRESSED THE IMPORTANCE OF ANNUAL EYE EXAMS, SELF FOOT CARE AND COMPLIANCE WITH OFFICE VISITS.  - POCT Urinalysis Dipstick (81002) - Microalbumin / Creatinine Urine Ratio - EKG 12-Lead - CBC - CMP14+EGFR - Lipid panel - Hemoglobin A1c  3. Hypertensive heart and renal disease with congestive heart failure (HCC) Comments: Chronic, well controlled. EKG: SB w/o acute changes. She will c/w amlodipine and isosorbide mononitrate 60mg  daily. Advised to follow low sodium diet.  She will f/u in 4 months. - POCT Urinalysis Dipstick (81002) - Microalbumin / Creatinine Urine Ratio - EKG 12-Lead  4. Chronic diastolic heart failure (HCC) Comments: Chronic, encouraged to follow low sodium diet. Last echo was performed Feb 2023. Will consider repeat echo  in 2025. She is w/o sob/orthopnea at this time.  5. Paroxysmal atrial fibrillation (HCC) Comments:  Chronic, she is rate controlled. She is currently in sinus rhythm. She is properly anticoagulated with Eliquis.  6. Bilateral impacted cerumen Comments: She declined ear lavage today. Wants to reschedule.  7. Primary hypothyroidism Comments: Chronic, I will check thyroid panel and adjust meds as needed.  She will rto in six months for re-evaluation. - TSH + free T4  8. Acquired thrombophilia (HCC) Comments: Chronic, currently on Eliquis 2.5mg  twice daily due to underlying PAF.  9. Class 2 severe obesity due to excess calories with serious comorbidity and body mass index (BMI) of 37.0 to 37.9 in adult [facility]) She is encouraged to strive for BMI less than 30 to decrease cardiac risk. Advised to aim for at least 150 minutes of exercise per week. She is encouraged to perform chair exercises while watching TV.    Return for 3 month dm & bpc. Patient was given opportunity to ask questions. Patient verbalized understanding of the plan and was able to repeat key elements of the plan. All questions were answered to their satisfaction.   I, [name], MD, have reviewed all documentation for this visit. The documentation on 08/11/22 for the exam, diagnosis, procedures, and orders are all accurate and complete.   THE PATIENT IS ENCOURAGED TO PRACTICE SOCIAL DISTANCING DUE TO THE COVID-19 PANDEMIC.

## 2022-08-11 NOTE — Patient Instructions (Signed)

## 2022-08-12 LAB — CMP14+EGFR
ALT: 23 IU/L (ref 0–32)
AST: 16 IU/L (ref 0–40)
Albumin/Globulin Ratio: 1.6 (ref 1.2–2.2)
Albumin: 3.9 g/dL (ref 3.7–4.7)
Alkaline Phosphatase: 123 IU/L — ABNORMAL HIGH (ref 44–121)
BUN/Creatinine Ratio: 12 (ref 12–28)
BUN: 39 mg/dL — ABNORMAL HIGH (ref 8–27)
Bilirubin Total: 0.3 mg/dL (ref 0.0–1.2)
CO2: 18 mmol/L — ABNORMAL LOW (ref 20–29)
Calcium: 9.1 mg/dL (ref 8.7–10.3)
Chloride: 111 mmol/L — ABNORMAL HIGH (ref 96–106)
Creatinine, Ser: 3.29 mg/dL — ABNORMAL HIGH (ref 0.57–1.00)
Globulin, Total: 2.4 g/dL (ref 1.5–4.5)
Glucose: 82 mg/dL (ref 70–99)
Potassium: 4.8 mmol/L (ref 3.5–5.2)
Sodium: 143 mmol/L (ref 134–144)
Total Protein: 6.3 g/dL (ref 6.0–8.5)
eGFR: 13 mL/min/{1.73_m2} — ABNORMAL LOW (ref >59)

## 2022-08-12 LAB — CBC
Hematocrit: 30.7 % — ABNORMAL LOW (ref 34.0–46.6)
Hemoglobin: 9.6 g/dL — ABNORMAL LOW (ref 11.1–15.9)
MCH: 30.5 pg (ref 26.6–33.0)
MCHC: 31.3 g/dL — ABNORMAL LOW (ref 31.5–35.7)
MCV: 98 fL — ABNORMAL HIGH (ref 79–97)
Platelets: 305 10*3/uL (ref 150–450)
RBC: 3.15 x10E6/uL — ABNORMAL LOW (ref 3.77–5.28)
RDW: 12.4 % (ref 11.7–15.4)
WBC: 9.4 10*3/uL (ref 3.4–10.8)

## 2022-08-12 LAB — TSH+FREE T4
Free T4: 1.8 ng/dL — ABNORMAL HIGH (ref 0.82–1.77)
TSH: 3.22 u[IU]/mL (ref 0.450–4.500)

## 2022-08-12 LAB — LIPID PANEL
Chol/HDL Ratio: 3.1 ratio (ref 0.0–4.4)
Cholesterol, Total: 122 mg/dL (ref 100–199)
HDL: 39 mg/dL — ABNORMAL LOW (ref >39)
LDL Chol Calc (NIH): 63 mg/dL (ref 0–99)
Triglycerides: 105 mg/dL (ref 0–149)
VLDL Cholesterol Cal: 20 mg/dL (ref 5–40)

## 2022-08-12 LAB — HEMOGLOBIN A1C
Est. average glucose Bld gHb Est-mCnc: 103 mg/dL
Hgb A1c MFr Bld: 5.2 % (ref 4.8–5.6)

## 2022-08-12 LAB — MICROALBUMIN / CREATININE URINE RATIO
Creatinine, Urine: 95.1 mg/dL
Microalb/Creat Ratio: 109 mg/g creat — ABNORMAL HIGH (ref 0–29)
Microalbumin, Urine: 103.3 ug/mL

## 2022-08-15 ENCOUNTER — Telehealth: Payer: Self-pay

## 2022-08-15 NOTE — Progress Notes (Cosign Needed)
Care Management & Coordination Services Pharmacy Team  Reason for Encounter: Appointment Reminder  Contacted patient to confirm telephone appointment with Cherylin Mylar, PharmD on 08-19-2022 at 3:00. Unsuccessful outreach. Left voicemail for patient to return call.   Chart review: Recent office visits:  08-11-2022 Dorothyann Peng, MD. Visit for annual exam  Recent consult visits:  04-08-2022 Rinaldo Cloud (cardiology). Unable to view encounter.  03-31-2022 Lenn Sink, DPM (Podiatry). Visit for toenail care.  Hospital visits:  None in previous 6 months   Star Rating Drugs:  Atorvastatin 40 mg- last filled 06-18-2022 100 DS [facility]. Previous 03-11-2022 100 DS   Care Gaps: Annual wellness visit in last year? Yes  Huey Romans [facility] Clinical Pharmacist Assistant [phone]

## 2022-08-18 ENCOUNTER — Other Ambulatory Visit: Payer: Self-pay

## 2022-08-22 ENCOUNTER — Other Ambulatory Visit: Payer: Self-pay | Admitting: Internal Medicine

## 2022-08-24 DIAGNOSIS — E039 Hypothyroidism, unspecified: Secondary | ICD-10-CM | POA: Insufficient documentation

## 2022-08-24 DIAGNOSIS — H6123 Impacted cerumen, bilateral: Secondary | ICD-10-CM | POA: Insufficient documentation

## 2022-08-24 DIAGNOSIS — D6869 Other thrombophilia: Secondary | ICD-10-CM | POA: Insufficient documentation

## 2022-08-24 DIAGNOSIS — I480 Paroxysmal atrial fibrillation: Secondary | ICD-10-CM | POA: Insufficient documentation

## 2022-08-25 ENCOUNTER — Encounter: Payer: Self-pay | Admitting: Internal Medicine

## 2022-08-28 DIAGNOSIS — R350 Frequency of micturition: Secondary | ICD-10-CM | POA: Diagnosis not present

## 2022-09-04 ENCOUNTER — Encounter: Payer: Self-pay | Admitting: Internal Medicine

## 2022-09-08 ENCOUNTER — Other Ambulatory Visit: Payer: Self-pay | Admitting: Internal Medicine

## 2022-09-22 ENCOUNTER — Other Ambulatory Visit: Payer: Medicare Other

## 2022-09-29 ENCOUNTER — Other Ambulatory Visit: Payer: Medicare Other

## 2022-09-29 DIAGNOSIS — E039 Hypothyroidism, unspecified: Secondary | ICD-10-CM | POA: Diagnosis not present

## 2022-09-30 LAB — TSH+FREE T4
Free T4: 1.72 ng/dL (ref 0.82–1.77)
TSH: 3.44 u[IU]/mL (ref 0.450–4.500)

## 2022-10-06 ENCOUNTER — Encounter: Payer: Self-pay | Admitting: Internal Medicine

## 2022-10-07 ENCOUNTER — Encounter: Payer: Self-pay | Admitting: Internal Medicine

## 2022-10-09 ENCOUNTER — Other Ambulatory Visit: Payer: Self-pay | Admitting: Internal Medicine

## 2022-10-09 DIAGNOSIS — I5032 Chronic diastolic (congestive) heart failure: Secondary | ICD-10-CM

## 2022-10-09 DIAGNOSIS — I130 Hypertensive heart and chronic kidney disease with heart failure and stage 1 through stage 4 chronic kidney disease, or unspecified chronic kidney disease: Secondary | ICD-10-CM

## 2022-10-09 DIAGNOSIS — N1832 Chronic kidney disease, stage 3b: Secondary | ICD-10-CM

## 2022-10-13 ENCOUNTER — Encounter: Payer: Self-pay | Admitting: Dietician

## 2022-10-13 ENCOUNTER — Encounter: Payer: Medicare Other | Attending: Orthopedic Surgery | Admitting: Dietician

## 2022-10-13 VITALS — Ht 60.0 in | Wt 180.0 lb

## 2022-10-13 DIAGNOSIS — I10 Essential (primary) hypertension: Secondary | ICD-10-CM | POA: Diagnosis not present

## 2022-10-13 DIAGNOSIS — N185 Chronic kidney disease, stage 5: Secondary | ICD-10-CM | POA: Diagnosis not present

## 2022-10-13 NOTE — Patient Instructions (Addendum)
No sports drinks or hydration packets.  Choose water or cranberry juice Avoid No Salt and Lite Salt products.  Avoid any seasonings with potassium.  Herbs are great.   Eat adequate amounts to maintain nutrition. 1 Boost daily (regular)  Limit meat portion. Continue to follow a low sodium diet.

## 2022-10-13 NOTE — Progress Notes (Signed)
   Medical Nutrition Therapy  Appointment Start time:  [phone]  Appointment End time:  1210  Primary concerns today: She is not eating much, daughter has to make her eat, she has lost a lot of weight.  Drinks Boosts (1-rarely 2) per day. 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 They are waiting on a nephrology appointment.  Referral diagnosis: HTN, Type 2 Diabetes with stage 3b CKD, CHF Preferred learning style: no preference indicated Learning readiness: ready   NUTRITION ASSESSMENT   Anthropometrics  60" 180 lbs 10/12/2022 186 lbs 03/2022 Previously about 300 lbs 2022  Clinical Medical Hx: Type 2 Diabetes, HTN, CKD, CHF Medications: amiodarone, Eliquis,  Labs: BUN 39, Creatinine 3.29, Potassium 4.8, eGFR 13 on 08/11/2022,  A1C 5.2% 08/11/2022 Notable Signs/Symptoms: none  Lifestyle & Dietary Hx Patient's daughter lives with her and does the cooking.  Other family helps as well. No added salt but some processed foods.  Supplements: Vitamin D3 Sleep: good Stress / self-care: no stress Current average weekly physical activity: walks with a walker,  uses a peddler daily, cleans and organizes daily  24-Hr Dietary Recall No added salt First Meal (late - around noon): [place] bacon, fried egg, 1 slice toast (dry) Snack: Boost Second Meal: none Snack: banana Third Meal: grilled chicken breast, mashed potatoes, fresh spinach (didn't eat much) Snack: pineapple Beverages: water, sugar free flavor packet, cranberry juice, hydration packet, Sunny D Orange drink, occasional Dr. Reino Kent  Estimated Energy Needs Calories: 1700-1800 Protein: 50-60 g   NUTRITION DIAGNOSIS  NB-1.1 Food and nutrition-related knowledge deficit As related to balance of carbohydrates, protein, and fat.  As evidenced by diet hx and patient report.  NUTRITION INTERVENTION  Nutrition education (E-1) on the following topics:  Review of a low sodium diet Impact of reduced kidney function on overall feeling of well  being Tips to improve intake Avoid potassium containing seasonings Avoid sports drinks/hydration packets due to high sodium Moderate protein Choose lower potassium fruits and vegetables most of the time  Handouts Provided Include  NKD national kidney diet - Dish up a Kidney-Friendly Meal for Patients with Chronic Kidney Disease (not on dialysis) Chronic Kidney Disease Stages 5  - Tips for People Not on Dialysis Receiving Conservative, Supportive, or Medical Care Chronic Kidney Disease Stages 3-5 Nutrition Therapy  Learning Style & Readiness for Change Teaching method utilized: Visual & Auditory  Demonstrated degree of understanding via: Teach Back  Barriers to learning/adherence to lifestyle change: walks with a walker  Goals Established by Pt  No sports drinks or hydration packets.  Choose water or cranberry juice Avoid No Salt and Lite Salt products.  Avoid any seasonings with potassium.  Herbs are great.   Eat adequate amounts to maintain nutrition. 1 Boost daily (regular)  Limit meat portion. Continue to follow a low sodium diet.     MONITORING & EVALUATION Dietary intake, weekly physical activity, and label reading  prn.  Next Steps  Patient is to call for questions.

## 2022-10-16 IMAGING — CT CT HEAD CODE STROKE
3 series · 15 of 47 positions shown, 18 images · non-contrast
Comparison: None.

CLINICAL DATA: Code stroke. Acute neuro deficit. Left-sided gaze.
Difficulty speaking.



[Series 2: head 5.0 st · axial · 0.41mm/px · z∈[-64,+66]mm · 9 of 32 slices shown, 12 images]
[im 3/32  brain]
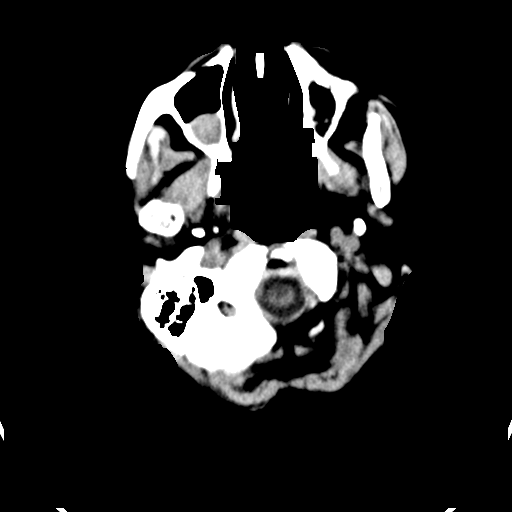
[im 3/32  bone]
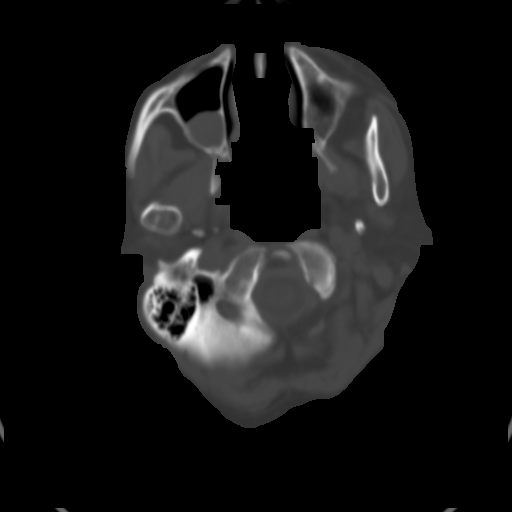
[im 6/32  brain]
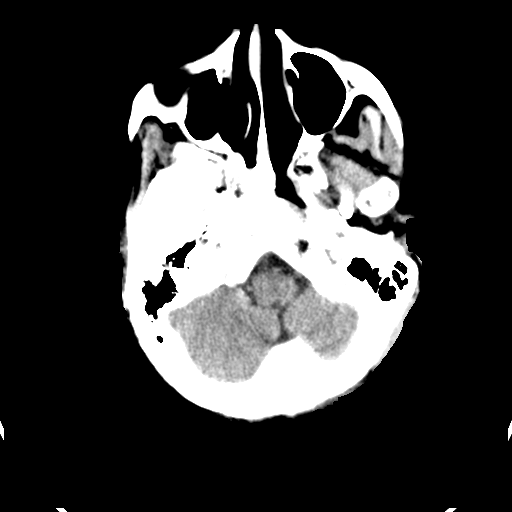
[im 9/32  brain]
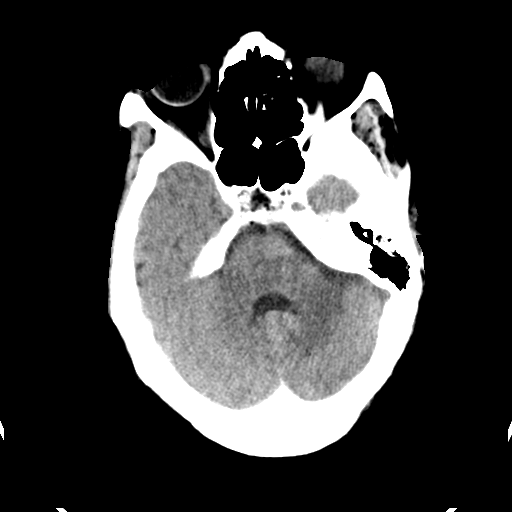
[im 12/32  brain]
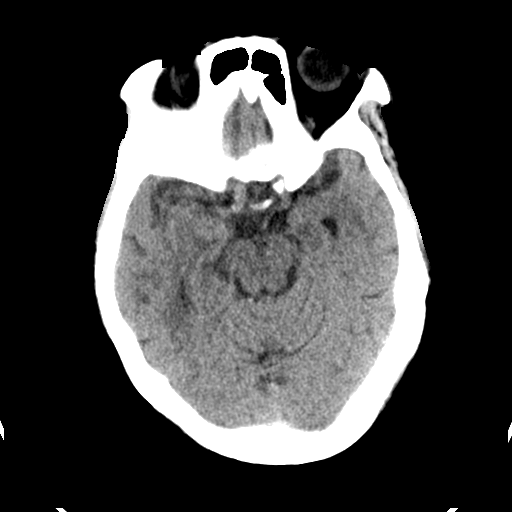
[im 17/32  brain]
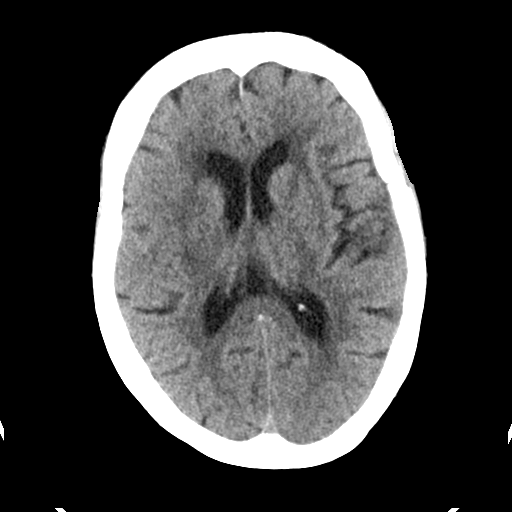
[im 17/32  bone]
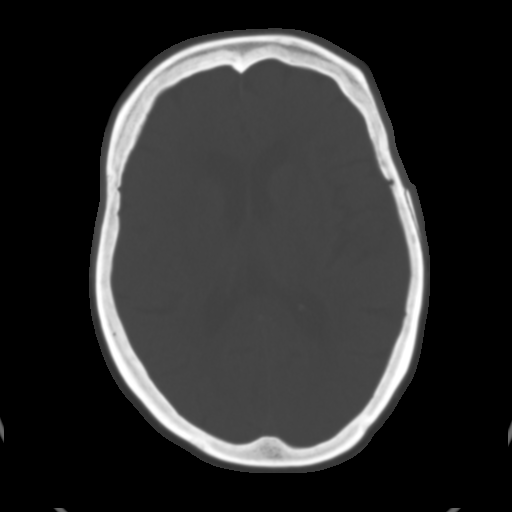
[im 20/32  brain]
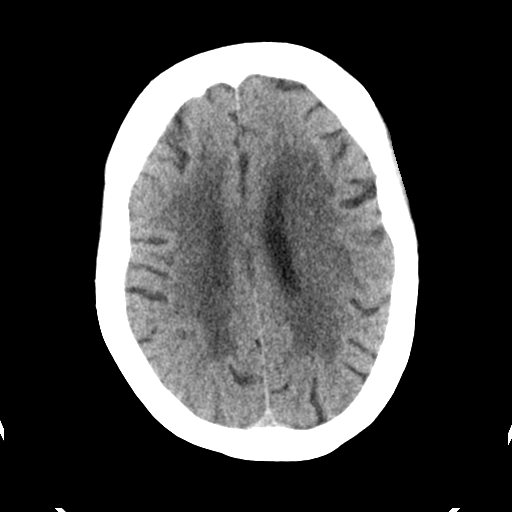
[im 23/32  brain]
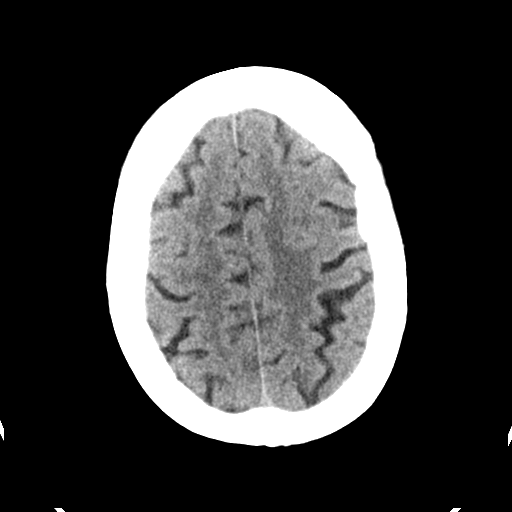
[im 26/32  brain]
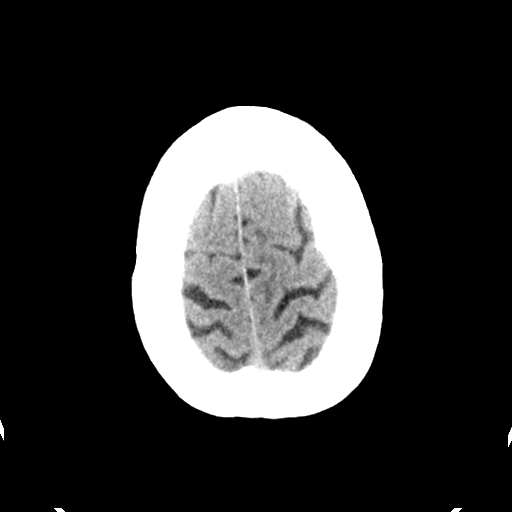
[im 29/32  brain]
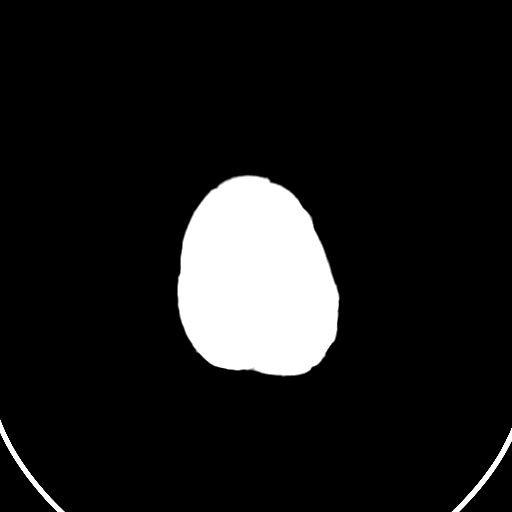
[im 29/32  bone]
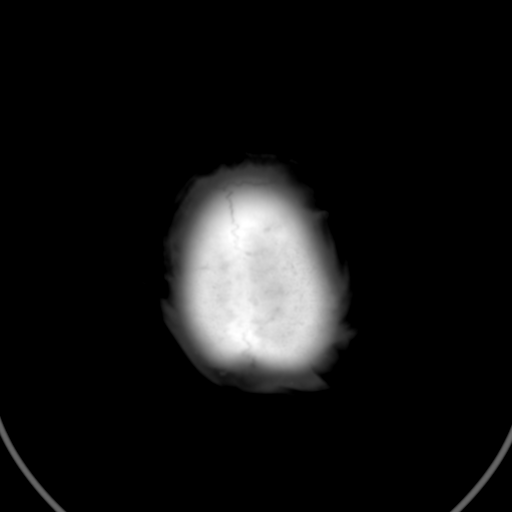

[Series 5: head 3.0 cor st · coronal · 0.29mm/px · 3 of 67 slices shown]
[im 23/67  brain]
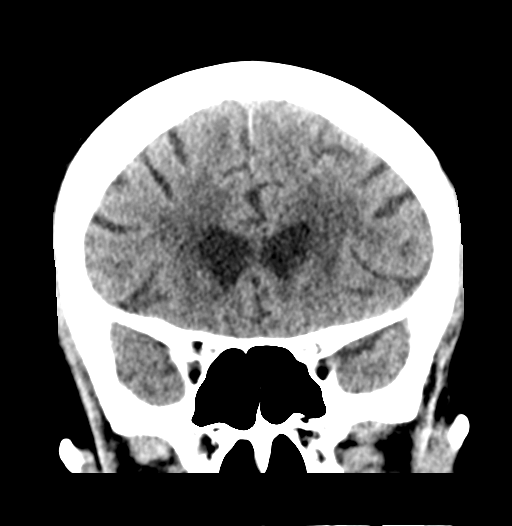
[im 30/67  brain]
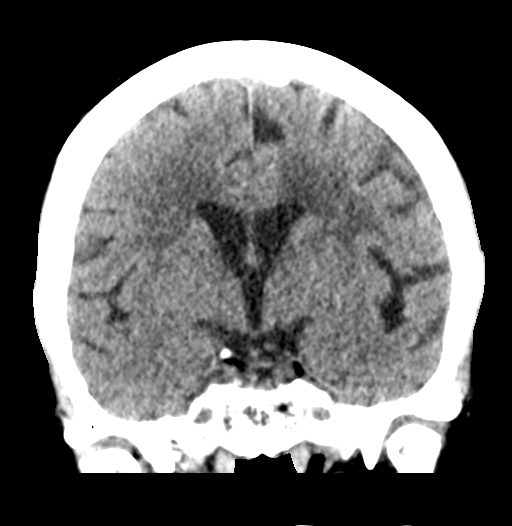
[im 37/67  brain]
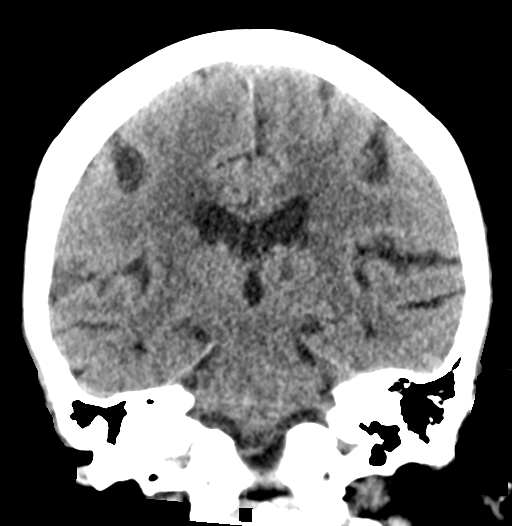

[Series 6: head 3.0 sag st · sagittal · 0.30mm/px · 3 of 49 slices shown]
[im 17/49  brain]
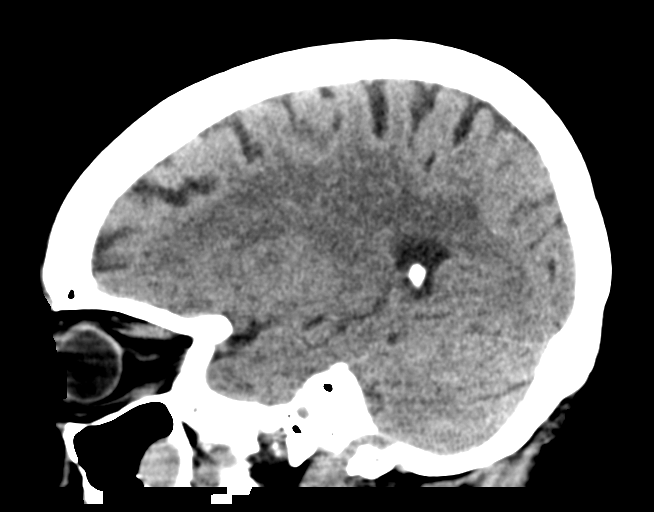
[im 25/49  brain]
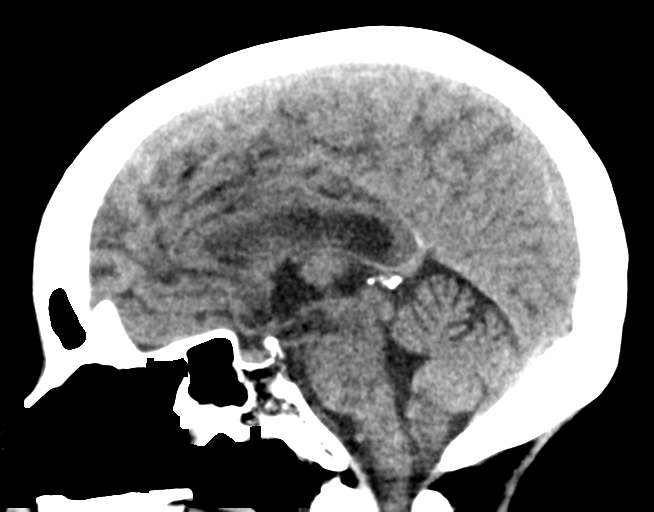
[im 33/49  brain]
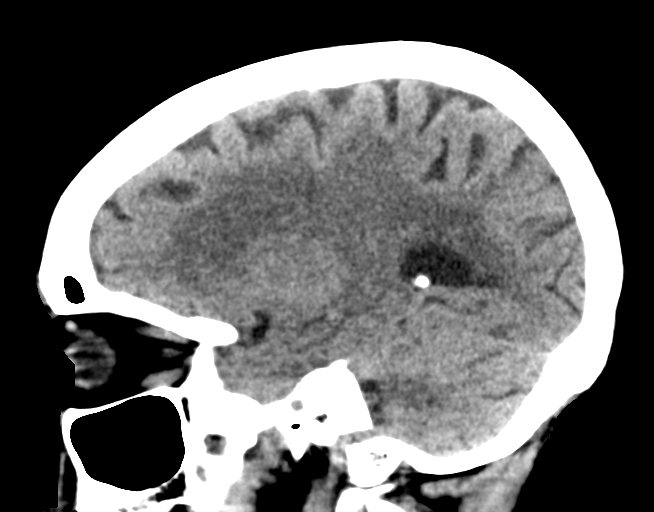

[15 of 47 positions shown; findings below may reference images not displayed]

FINDINGS: Brain: Mild atrophy. Moderate white matter hypodensity bilaterally.
This is symmetric and most likely chronic microvascular ischemia.

Negative for acute hemorrhage or mass.

8 mm hypodensity left thalamus compatible with infarct of
indeterminate age.

Vascular: Negative for hyperdense vessel

Skull: Negative

Sinuses/Orbits: Retention cyst right maxillary sinus. Remaining
sinuses clear. Bilateral cataract extraction

Other: None

ASPECTS (Alberta Stroke Program Early CT Score)

- Ganglionic level infarction (caudate, lentiform nuclei, internal
capsule, insula, M1-M3 cortex): 7

- Supraganglionic infarction (M4-M6 cortex): 3

Total score (0-10 with 10 being normal): 10
IMPRESSION: 1. 8 mm hypodensity left thalamus compatible with infarct of
indeterminate age. Possibly acute. No acute hemorrhage.
2. ASPECTS is 10
3. Atrophy and moderate chronic microvascular ischemic change.
4. Code stroke imaging results were communicated on 04/23/2021 at
[DATE] to provider Shalley via text page

## 2022-10-16 IMAGING — MR MR MRA NECK W/O CM
1 of 3 series · 18 of 48 positions shown · non-contrast
Comparison: Noncontrast head CT performed earlier today 04/23/2021.
Concurrently performed MRA of the head.

CLINICAL DATA: Provided history: Stroke, follow-up.

EXAM:
MRA NECK WITHOUT CONTRAST
TECHNIQUE: Angiographic images of the neck were acquired using MRA technique
without intravenous contrast. Carotid stenosis measurements (when
applicable) are obtained utilizing NASCET criteria, using the distal
internal carotid diameter as the denominator.

[Series 4: sag inhance (id) · sagittal · 1.2mm · 0.47mm/px · 18 of 360 slices shown]
[im 1/360]
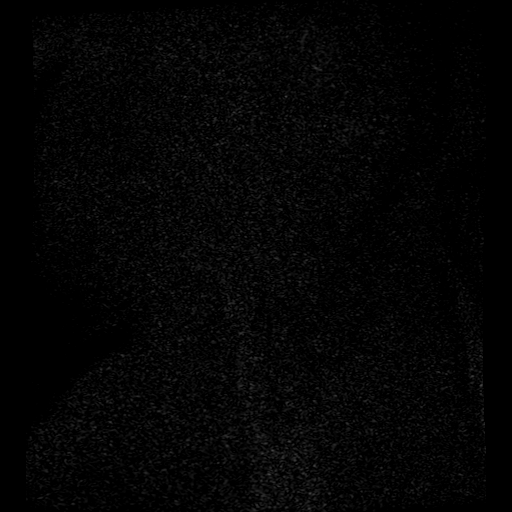
[im 12/360]
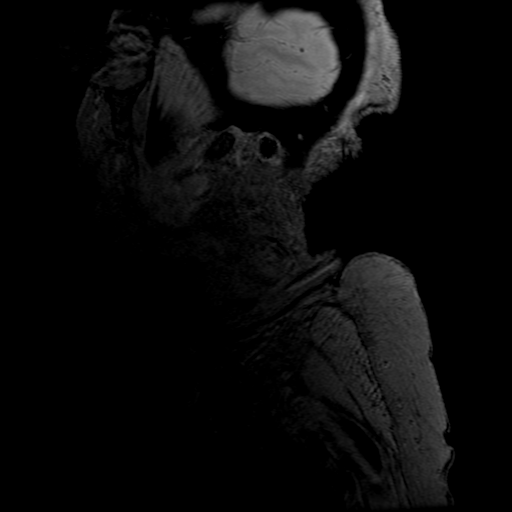
[im 23/360]
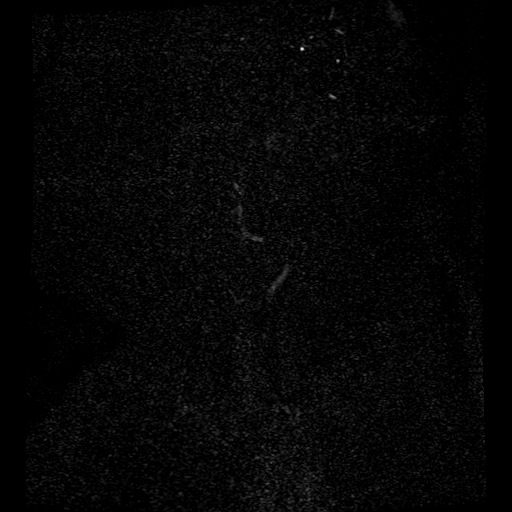
[im 34/360]
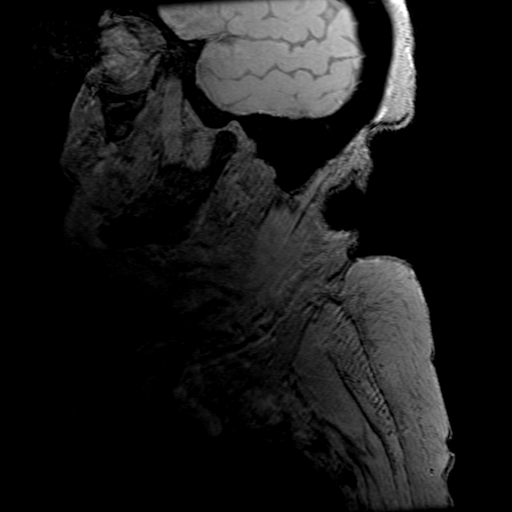
[im 45/360]
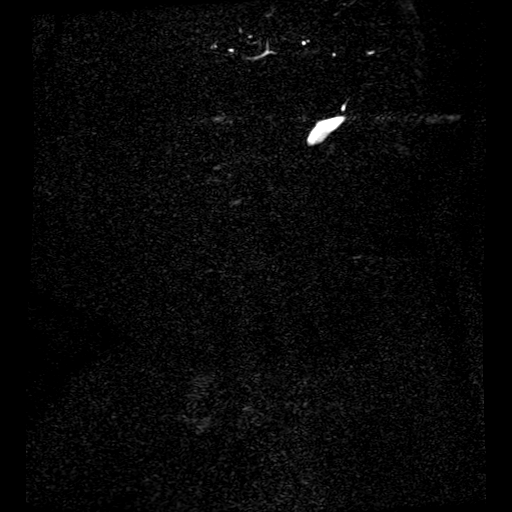
[im 57/360]
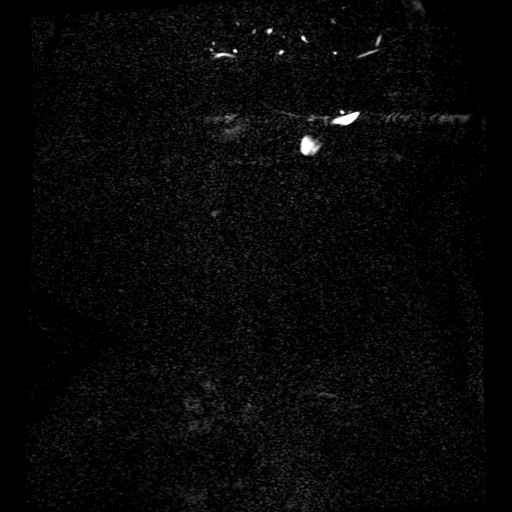
[im 68/360]
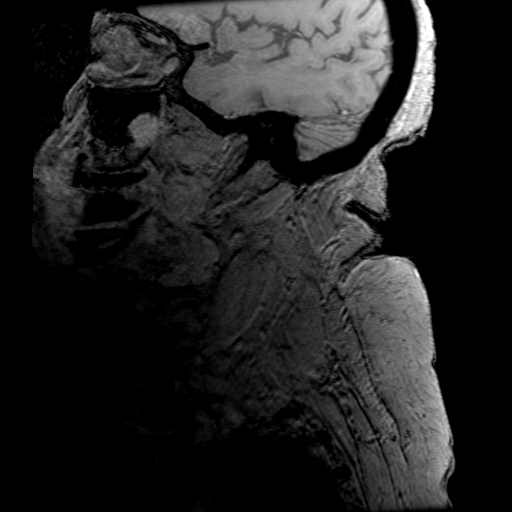
[im 79/360]
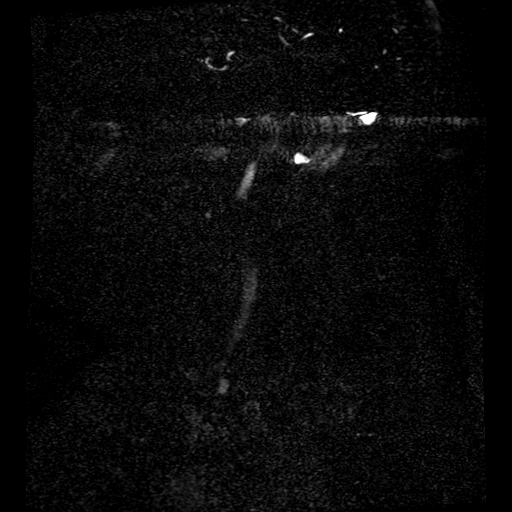
[im 90/360]
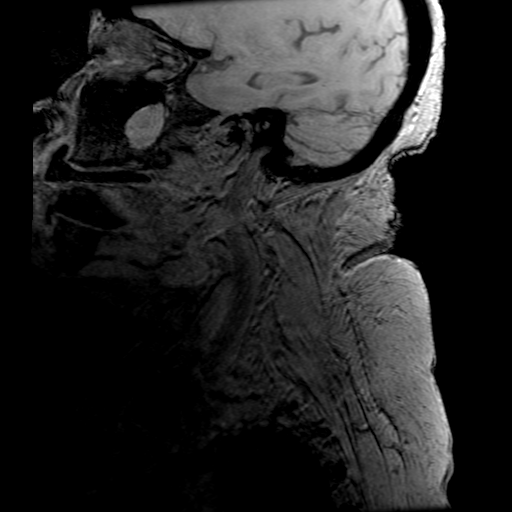
[im 101/360]
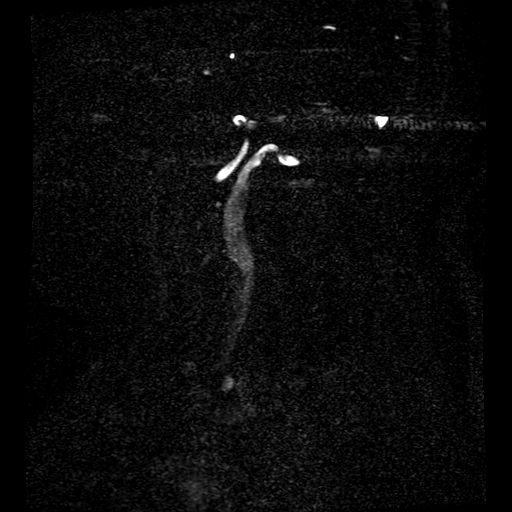
[im 113/360]
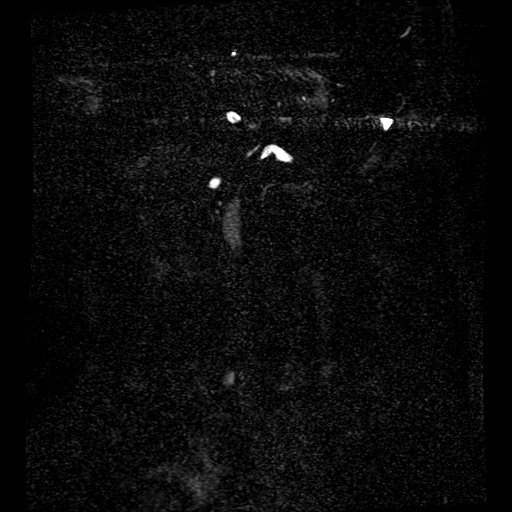
[im 158/360]
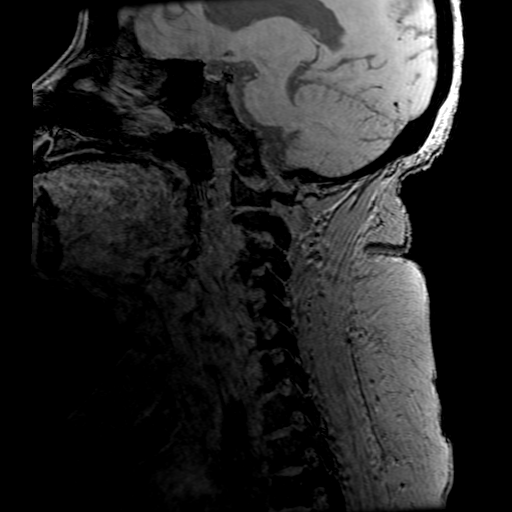
[im 180/360]
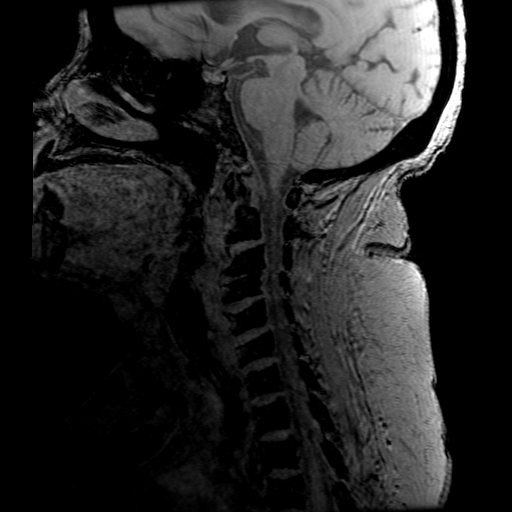
[im 202/360]
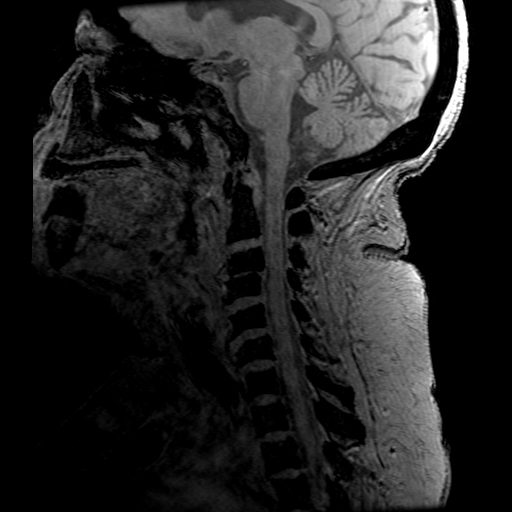
[im 247/360]
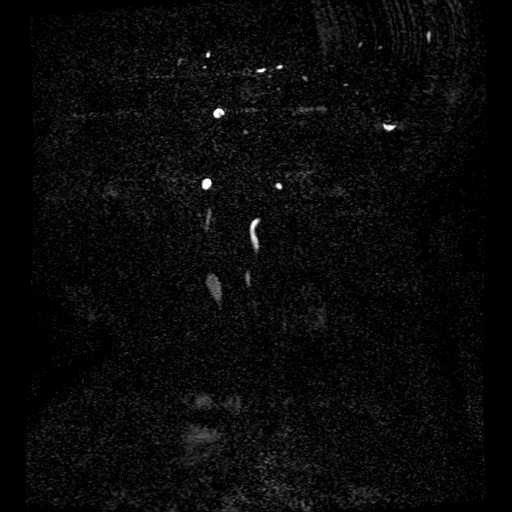
[im 292/360]
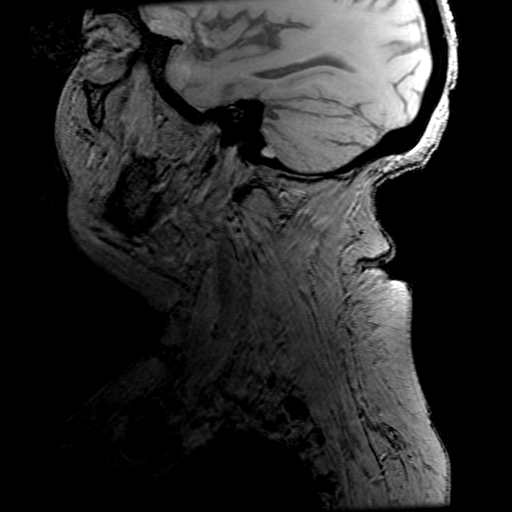
[im 303/360]
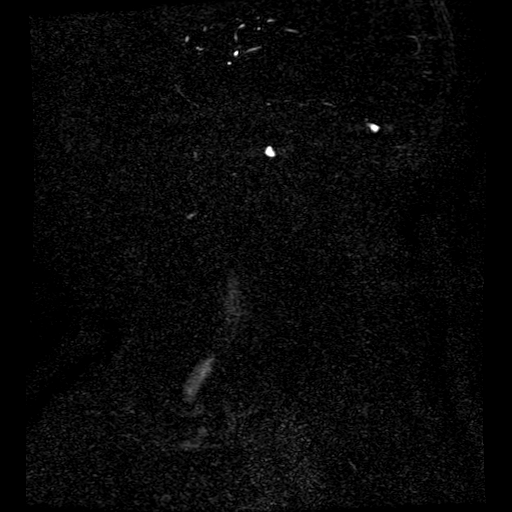
[im 337/360]
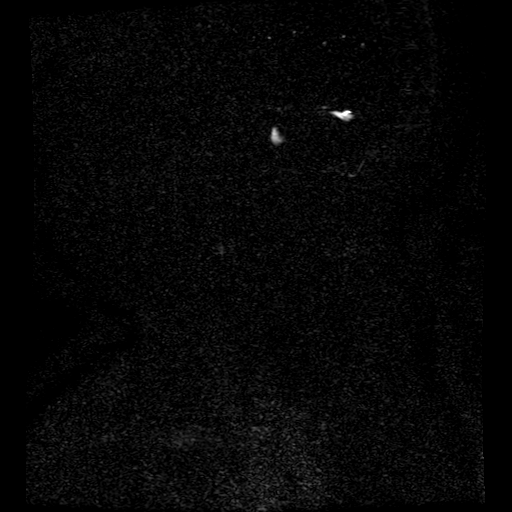

[18 of 48 positions shown; findings below may reference images not displayed]

FINDINGS: Examination limited by motion degradation and non-contrast
technique.

Common origin of the innominate and left common carotid arteries.

Motion degradation and non-contrast technique limits evaluation of
the proximal right common carotid artery. Within this limitation,
the common carotid and internal carotid arteries appear patent
within the neck without hemodynamically significant stenosis. Mild
atherosclerotic plaque within the mid left CCA. Mild irregularity of
the cervical internal carotid arteries, bilaterally, which may
reflect atherosclerotic disease or sequela of fibromuscular
dysplasia.

Motion degradation and non-contrast technique limits evaluation of
the origins of the vertebral arteries. Elsewhere, the dominant left
vertebral artery is patent within the neck without appreciable
stenosis. The right vertebral artery is developmentally diminutive,
but appears patent within the neck.
IMPRESSION: Examination limited by motion degradation and non-contrast
technique.

No appreciable hemodynamically significant stenosis within the
common carotid or internal carotid arteries within the neck. Mild
atherosclerotic plaque within the mid left common carotid artery.
Mild irregularity of the mid cervical internal carotid arteries,
bilaterally, which may reflect atherosclerotic disease or sequela of
fibromuscular dysplasia. Additionally, there is a partially
retropharyngeal course of the cervical internal carotid arteries.

Motion degradation and non-contrast technique limits evaluation of
the vertebral artery origins. Elsewhere, the dominant left vertebral
artery is patent within the neck without appreciable stenosis. The
right vertebral artery is developmentally diminutive, but appears
patent within the neck.

## 2022-10-22 ENCOUNTER — Ambulatory Visit (INDEPENDENT_AMBULATORY_CARE_PROVIDER_SITE_OTHER): Payer: Medicare Other | Admitting: Family Medicine

## 2022-10-22 ENCOUNTER — Encounter: Payer: Self-pay | Admitting: Family Medicine

## 2022-10-22 VITALS — BP 110/70 | HR 67 | Temp 98.1°F | Ht 60.0 in | Wt 185.0 lb

## 2022-10-22 DIAGNOSIS — M791 Myalgia, unspecified site: Secondary | ICD-10-CM | POA: Insufficient documentation

## 2022-10-22 DIAGNOSIS — E1122 Type 2 diabetes mellitus with diabetic chronic kidney disease: Secondary | ICD-10-CM

## 2022-10-22 DIAGNOSIS — N184 Chronic kidney disease, stage 4 (severe): Secondary | ICD-10-CM | POA: Diagnosis not present

## 2022-10-22 DIAGNOSIS — D6869 Other thrombophilia: Secondary | ICD-10-CM | POA: Diagnosis not present

## 2022-10-22 MED ORDER — MUSCLE RUB 10-15 % EX CREA
1.0000 | TOPICAL_CREAM | CUTANEOUS | Status: AC | PRN
Start: 2022-10-22 — End: ?

## 2022-10-22 NOTE — Progress Notes (Signed)
I,Jameka Rita Yoder, CMA,acting as a Neurosurgeon for [employer], NP.,have documented all relevant documentation on the behalf of Rita Guilliams, NP,as directed by  Aleya Durnell Moshe Salisbury, NP while in the presence of Glendon Dunwoody, NP.  Subjective:  Patient ID: Rita Yoder , female    DOB: 22-Jun-1940 , 82 y.o.   MRN: [id number]  Chief Complaint  Patient presents with   facial concerns    HPI  Patient presents today for facial changes.Patient was accompanied to the office with her daughter, who states that about a week and a half it seems like her mom's face looked different. Asked patient to smile no facial droop or unevenness noted.Patient denies numbness of face or weakness of limbs but states that her left arm is painful and the pain has been ongoing since she hd a strong in 04/2021.     Past Medical History:  Diagnosis Date   Chronic kidney disease 12/2009   Diabetes mellitus 2010   Dyslipidemia    Hypertension    Monoclonal gammopathy 03/2010     Family History  Problem Relation Age of Onset   Healthy Mother    Heart attack Brother    Heart Problems Maternal Grandmother    Stroke Maternal Grandmother    Cancer Maternal Aunt    Stroke Maternal Aunt    Cancer Maternal Uncle      Current Outpatient Medications:    amiodarone (PACERONE) 200 MG tablet, TAKE ONE-HALF TABLET BY MOUTH  DAILY, Disp: 50 tablet, Rfl: 2   amLODipine (NORVASC) 5 MG tablet, TAKE 1 TABLET BY MOUTH DAILY, Disp: 100 tablet, Rfl: 2   atorvastatin (LIPITOR) 40 MG tablet, TAKE 1 TABLET BY MOUTH DAILY, Disp: 100 tablet, Rfl: 2   Blood Glucose Calibration (OT ULTRA/FASTTK CNTRL SOLN) SOLN, , Disp: , Rfl:    Cholecalciferol (VITAMIN D3) 50 MCG (2000 UT) TABS, Take 2,000 Units by mouth daily., Disp: 30 tablet, Rfl: 0   ELIQUIS 2.5 MG TABS tablet, TAKE 1 TABLET BY MOUTH TWICE  DAILY, Disp: 200 tablet, Rfl: 2   isosorbide mononitrate (IMDUR) 60 MG 24 hr tablet, TAKE 1 TABLET BY MOUTH DAILY, Disp: 100 tablet, Rfl: 2    levETIRAcetam (KEPPRA) 500 MG tablet, TAKE 1 TABLET BY MOUTH TWICE  DAILY, Disp: 200 tablet, Rfl: 2   levothyroxine (SYNTHROID) 50 MCG tablet, TAKE 1 TABLET BY MOUTH DAILY, Disp: 100 tablet, Rfl: 2   MODERNA COVID-19 BIVAL BOOSTER 50 MCG/0.5ML injection, , Disp: , Rfl:    nitroGLYCERIN (NITROSTAT) 0.4 MG SL tablet, DISSOLVE 1 TABLET UNDER THE TONGUE AT FIRST SIGN OF ATTACKS, MAY REPEAT EVERY 5 MINUTES UP TO 3 TABLETS, IF NO RELLIEF SEEK MEDICAL HELP (Patient taking differently: Place 0.4 mg under the tongue every 5 (five) minutes x 3 doses as needed for chest pain (SEEK HELP, IF NO RELIEF).), Disp: 25 tablet, Rfl: 1   solifenacin (VESICARE) 5 MG tablet, Take 5 mg by mouth daily., Disp: , Rfl:    Menthol-Methyl Salicylate (MUSCLE RUB) 10-15 % CREA, Apply 1 Application topically as needed for muscle pain., Disp: , Rfl:    Allergies  Allergen Reactions   Losartan Other (See Comments)    MD discontinued this because of decreased kidney function     Review of Systems  Constitutional: Negative.   HENT: Negative.    Respiratory: Negative.    Genitourinary: Negative.   Musculoskeletal:  Positive for arthralgias and gait problem.       Sits on a wheelchair  Psychiatric/Behavioral: Negative.  Today's Vitals   10/22/22 1544  BP: 110/70  Pulse: 67  Temp: 98.1 F (36.7 C)  Weight: 185 lb (83.9 kg)  Height: 5' (1.524 m)  PainSc: 0-No pain   Body mass index is 36.13 kg/m.  Wt Readings from Last 3 Encounters:  10/22/22 185 lb (83.9 kg)  10/13/22 180 lb (81.6 kg)  08/11/22 186 lb 9.6 oz (84.6 kg)     Objective:  Physical Exam Pulmonary:     Breath sounds: Normal breath sounds.  Abdominal:     General: Bowel sounds are normal.  Skin:    General: Skin is warm and dry.  Neurological:     Mental Status: She is alert.         Assessment And Plan:  Muscle pain Assessment & Plan: Use pain cream on affected areas.  Orders: -     Muscle Rub; Apply 1 Application topically as  needed for muscle pain.  Type 2 diabetes mellitus with stage 4 chronic kidney disease, without long-term current use of insulin (HCC) Assessment & Plan: Chronic. eGFR 13 on 08/2022, referred to Nephrology. Had not seen previous Nephrologist in over 2 years  Orders: -     Ambulatory referral to Nephrology  Acquired thrombophilia [facility]) Assessment & Plan: Chronic, currently on Eliquis 2.5mg  BID d/t PAF      Return if symptoms worsen or fail to improve, for Keep appt.  Patient was given opportunity to ask questions. Patient verbalized understanding of the plan and was able to repeat key elements of the plan. All questions were answered to their satisfaction.  Ryatt Corsino Moshe Salisbury, NP  I, Maxamilian Amadon Moshe Salisbury, NP, have reviewed all documentation for this visit. The documentation on 10/28/22 for the exam, diagnosis, procedures, and orders are all accurate and complete.   IF YOU HAVE BEEN REFERRED TO A SPECIALIST, IT MAY TAKE 1-2 WEEKS TO SCHEDULE/PROCESS THE REFERRAL. IF YOU HAVE NOT HEARD FROM US/SPECIALIST IN TWO WEEKS, PLEASE GIVE [place] A CALL AT [phone].   THE PATIENT IS ENCOURAGED TO PRACTICE SOCIAL DISTANCING DUE TO THE COVID-19 PANDEMIC.

## 2022-10-27 DIAGNOSIS — I129 Hypertensive chronic kidney disease with stage 1 through stage 4 chronic kidney disease, or unspecified chronic kidney disease: Secondary | ICD-10-CM | POA: Diagnosis not present

## 2022-10-27 DIAGNOSIS — E1122 Type 2 diabetes mellitus with diabetic chronic kidney disease: Secondary | ICD-10-CM | POA: Diagnosis not present

## 2022-10-27 DIAGNOSIS — I890 Lymphedema, not elsewhere classified: Secondary | ICD-10-CM | POA: Diagnosis not present

## 2022-10-27 DIAGNOSIS — M25559 Pain in unspecified hip: Secondary | ICD-10-CM | POA: Diagnosis not present

## 2022-10-27 DIAGNOSIS — N182 Chronic kidney disease, stage 2 (mild): Secondary | ICD-10-CM | POA: Diagnosis not present

## 2022-10-28 NOTE — Assessment & Plan Note (Signed)
Chronic, currently on Eliquis 2.5mg  BID d/t PAF

## 2022-10-28 NOTE — Assessment & Plan Note (Signed)
Use pain cream on affected areas.

## 2022-10-28 NOTE — Assessment & Plan Note (Signed)
Chronic. eGFR 13 on 08/2022, referred to Nephrology. Had not seen previous Nephrologist in over 2 years

## 2022-11-11 ENCOUNTER — Ambulatory Visit: Payer: Medicare Other | Admitting: Internal Medicine

## 2022-11-26 ENCOUNTER — Other Ambulatory Visit: Payer: Self-pay

## 2022-11-26 ENCOUNTER — Encounter: Payer: Self-pay | Admitting: Internal Medicine

## 2022-11-27 DIAGNOSIS — N1832 Chronic kidney disease, stage 3b: Secondary | ICD-10-CM | POA: Diagnosis not present

## 2022-12-02 DIAGNOSIS — E1122 Type 2 diabetes mellitus with diabetic chronic kidney disease: Secondary | ICD-10-CM | POA: Diagnosis not present

## 2022-12-02 DIAGNOSIS — E872 Acidosis, unspecified: Secondary | ICD-10-CM | POA: Diagnosis not present

## 2022-12-02 DIAGNOSIS — N182 Chronic kidney disease, stage 2 (mild): Secondary | ICD-10-CM | POA: Diagnosis not present

## 2022-12-02 DIAGNOSIS — M25559 Pain in unspecified hip: Secondary | ICD-10-CM | POA: Diagnosis not present

## 2022-12-02 DIAGNOSIS — I890 Lymphedema, not elsewhere classified: Secondary | ICD-10-CM | POA: Diagnosis not present

## 2022-12-02 DIAGNOSIS — I129 Hypertensive chronic kidney disease with stage 1 through stage 4 chronic kidney disease, or unspecified chronic kidney disease: Secondary | ICD-10-CM | POA: Diagnosis not present

## 2022-12-10 ENCOUNTER — Encounter: Payer: Self-pay | Admitting: Internal Medicine

## 2022-12-12 ENCOUNTER — Other Ambulatory Visit: Payer: Self-pay | Admitting: Internal Medicine

## 2022-12-15 ENCOUNTER — Ambulatory Visit (INDEPENDENT_AMBULATORY_CARE_PROVIDER_SITE_OTHER): Payer: Medicare Other | Admitting: Internal Medicine

## 2022-12-15 ENCOUNTER — Encounter: Payer: Self-pay | Admitting: Internal Medicine

## 2022-12-15 VITALS — BP 110/68 | HR 61 | Temp 97.9°F | Ht 60.0 in | Wt 185.4 lb

## 2022-12-15 DIAGNOSIS — I700 Atherosclerosis of aorta: Secondary | ICD-10-CM

## 2022-12-15 DIAGNOSIS — N184 Chronic kidney disease, stage 4 (severe): Secondary | ICD-10-CM

## 2022-12-15 DIAGNOSIS — E1122 Type 2 diabetes mellitus with diabetic chronic kidney disease: Secondary | ICD-10-CM

## 2022-12-15 DIAGNOSIS — I480 Paroxysmal atrial fibrillation: Secondary | ICD-10-CM | POA: Diagnosis not present

## 2022-12-15 DIAGNOSIS — D6869 Other thrombophilia: Secondary | ICD-10-CM | POA: Diagnosis not present

## 2022-12-15 DIAGNOSIS — I5032 Chronic diastolic (congestive) heart failure: Secondary | ICD-10-CM | POA: Diagnosis not present

## 2022-12-15 DIAGNOSIS — Z9989 Dependence on other enabling machines and devices: Secondary | ICD-10-CM | POA: Diagnosis not present

## 2022-12-15 DIAGNOSIS — Z6836 Body mass index (BMI) 36.0-36.9, adult: Secondary | ICD-10-CM

## 2022-12-15 DIAGNOSIS — Z7189 Other specified counseling: Secondary | ICD-10-CM

## 2022-12-15 DIAGNOSIS — E66812 Obesity, class 2: Secondary | ICD-10-CM

## 2022-12-15 DIAGNOSIS — I130 Hypertensive heart and chronic kidney disease with heart failure and stage 1 through stage 4 chronic kidney disease, or unspecified chronic kidney disease: Secondary | ICD-10-CM | POA: Insufficient documentation

## 2022-12-15 NOTE — Assessment & Plan Note (Addendum)
Chronic, also followed by Nephrology. Chronic, she is encouraged to stay well hydrated, avoid NSAIDs and keep BP controlled to prevent progression of CKD.

## 2022-12-15 NOTE — Progress Notes (Signed)
I,Rita Yoder, CMA,acting as a Neurosurgeon for Rita Aliment, MD.,have documented all relevant documentation on the behalf of Rita Aliment, MD,as directed by  Rita Aliment, MD while in the presence of Rita Aliment, MD.  Subjective:  Patient ID: Rita Yoder , female    DOB: [birth year] , 82 y.o.   MRN: [id number]  Chief Complaint  Patient presents with   Diabetes   Hypertension    HPI  She presents today for diabetes and BP check.  She is accompanied by her son today. She reports compliance with meds. She denies headaches, chest pain and shortness of breath.   Her son would like to discuss most previous report from kidney specialist.   Letter sent to Dr Harriette Bouillon for DM eye exam.   Diabetes She presents for her follow-up diabetic visit. She has type 2 diabetes mellitus. Her disease course has been stable. There are no hypoglycemic associated symptoms. There are no diabetic associated symptoms. Pertinent negatives for diabetes include no polydipsia, no polyphagia and no polyuria. Diabetic complications include nephropathy. Risk factors for coronary artery disease include post-menopausal, sedentary lifestyle and hypertension. Current diabetic treatment includes diet.  Hypertension This is a chronic problem. The current episode started more than 1 year ago. The problem has been gradually improving since onset. The problem is controlled. The current treatment provides moderate improvement. Hypertensive end-organ damage includes kidney disease.     Past Medical History:  Diagnosis Date   Chronic kidney disease 12/2009   Diabetes mellitus 2010   Dyslipidemia    Hypertension    Monoclonal gammopathy 03/2010     Family History  Problem Relation Age of Onset   Healthy Mother    Heart attack Brother    Heart Problems Maternal Grandmother    Stroke Maternal Grandmother    Cancer Maternal Aunt    Stroke Maternal Aunt    Cancer Maternal Uncle      Current Outpatient  Medications:    amiodarone (PACERONE) 200 MG tablet, TAKE ONE-HALF TABLET BY MOUTH  DAILY, Disp: 50 tablet, Rfl: 2   amLODipine (NORVASC) 5 MG tablet, TAKE 1 TABLET BY MOUTH DAILY, Disp: 100 tablet, Rfl: 2   atorvastatin (LIPITOR) 40 MG tablet, TAKE 1 TABLET BY MOUTH DAILY, Disp: 100 tablet, Rfl: 2   Blood Glucose Calibration (OT ULTRA/FASTTK CNTRL SOLN) SOLN, , Disp: , Rfl:    Cholecalciferol (VITAMIN D3) 50 MCG (2000 UT) TABS, Take 2,000 Units by mouth daily., Disp: 30 tablet, Rfl: 0   ELIQUIS 2.5 MG TABS tablet, TAKE 1 TABLET BY MOUTH TWICE  DAILY, Disp: 200 tablet, Rfl: 2   isosorbide mononitrate (IMDUR) 60 MG 24 hr tablet, TAKE 1 TABLET BY MOUTH DAILY, Disp: 100 tablet, Rfl: 2   levothyroxine (SYNTHROID) 50 MCG tablet, TAKE 1 TABLET BY MOUTH DAILY, Disp: 100 tablet, Rfl: 2   Menthol-Methyl Salicylate (MUSCLE RUB) 10-15 % CREA, Apply 1 Application topically as needed for muscle pain., Disp: , Rfl:    MODERNA COVID-19 BIVAL BOOSTER 50 MCG/0.5ML injection, , Disp: , Rfl:    nitroGLYCERIN (NITROSTAT) 0.4 MG SL tablet, DISSOLVE 1 TABLET UNDER THE TONGUE AT FIRST SIGN OF ATTACKS, MAY REPEAT EVERY 5 MINUTES UP TO 3 TABLETS, IF NO RELLIEF SEEK MEDICAL HELP (Patient taking differently: Place 0.4 mg under the tongue every 5 (five) minutes x 3 doses as needed for chest pain (SEEK HELP, IF NO RELIEF).), Disp: 25 tablet, Rfl: 1   solifenacin (VESICARE) 5 MG tablet, Take 5 mg  by mouth daily., Disp: , Rfl:    levETIRAcetam (KEPPRA) 500 MG tablet, TAKE 1 TABLET BY MOUTH TWICE  DAILY, Disp: 200 tablet, Rfl: 2   Allergies  Allergen Reactions   Losartan Other (See Comments)    MD discontinued this because of decreased kidney function     Review of Systems  Constitutional: Negative.   Respiratory: Negative.    Cardiovascular: Negative.   Gastrointestinal: Negative.   Endocrine: Negative for polydipsia, polyphagia and polyuria.  Neurological: Negative.   Psychiatric/Behavioral: Negative.       Today's  Vitals   12/15/22 0912  BP: 110/68  Pulse: 61  Temp: 97.9 F (36.6 C)  SpO2: 98%  Weight: 185 lb 6.4 oz (84.1 kg)  Height: 5' (1.524 m)   Body mass index is 36.21 kg/m.  Wt Readings from Last 3 Encounters:  12/15/22 185 lb 6.4 oz (84.1 kg)  10/22/22 185 lb (83.9 kg)  10/13/22 180 lb (81.6 kg)     Objective:  Physical Exam Vitals and nursing note reviewed.  Constitutional:      Appearance: Normal appearance. She is obese.  HENT:     Head: Normocephalic and atraumatic.  Eyes:     Extraocular Movements: Extraocular movements intact.  Cardiovascular:     Rate and Rhythm: Normal rate and regular rhythm.     Heart sounds: Normal heart sounds.  Pulmonary:     Effort: Pulmonary effort is normal.     Breath sounds: Normal breath sounds.  Musculoskeletal:     Cervical back: Normal range of motion.     Comments: Ambulatory with walker  Skin:    General: Skin is warm.  Neurological:     General: No focal deficit present.     Mental Status: She is alert.  Psychiatric:        Mood and Affect: Mood normal.        Behavior: Behavior normal.         Assessment And Plan:  Type 2 diabetes mellitus with stage 4 chronic kidney disease, without long-term current use of insulin (HCC) Assessment & Plan: Chronic, she has done a great job controlling this without meds. She is encouraged to keep up with regular eye exams. I did review most recent Nephrology note with patient and her son. Reminded to avoid NSAIDs, stay hydrated and keep BP well controlled to decrease risk of CKD progression.    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 She will continue with amlodipine 5mg , Imdur, and statin therapy.    Paroxysmal atrial fibrillation (HCC) Assessment & Plan: Chronic, currently rate controlled and properly anticoagulated.    Atherosclerosis of aorta (HCC) Assessment & Plan: Chronic, LDL goal is less than  70. She is currently on atorvastatin 40mg  daily. Encouraged to avoid fried foods, incorporate chair exercises into her daily routine and to aim for at least 7 hours of sleep per night.    Chronic diastolic heart failure (HCC) Assessment & Plan: Chronic, encouraged to increase her daily routine. Advised to perform chair exercises during commercials of her favorite TV show and to follow low sodium diet.    Acquired thrombophilia (HCC) Assessment & Plan: Chronic, currently on Eliquis 2.5mg  twice daily due to underlying PAF.   Class 2 severe obesity due to excess calories with serious comorbidity and body mass index (BMI) of 36.0 to 36.9 in adult [place]) Assessment & Plan: She is encouraged to strive for BMI less than 30 to  decrease cardiac risk. Advised to aim for at least 150 minutes of exercise per week.    Advanced care planning/counseling discussion Assessment & Plan: I discussed DNR and MOST forms with patient and her son for greater than 5 minutes. They prefer to discuss details with the remainder of the family. They will discuss further and we will finalize at her next visit. She does not wish to be resuscitated. She is not yet sure about fluids, abx, etc.    She is encouraged to strive for BMI less than 30 to decrease cardiac risk. Advised to aim for at least 150 minutes of exercise per week.    Return if symptoms worsen or fail to improve.  Patient was given opportunity to ask questions. Patient verbalized understanding of the plan and was able to repeat key elements of the plan. All questions were answered to their satisfaction.    I, Rita Aliment, MD, have reviewed all documentation for this visit. The documentation on 12/15/22 for the exam, diagnosis, procedures, and orders are all accurate and complete.   IF YOU HAVE BEEN REFERRED TO A SPECIALIST, IT MAY TAKE 1-2 WEEKS TO SCHEDULE/PROCESS THE REFERRAL. IF YOU HAVE NOT HEARD FROM US/SPECIALIST IN TWO WEEKS, PLEASE GIVE [place] A  CALL AT [phone].   THE PATIENT IS ENCOURAGED TO PRACTICE SOCIAL DISTANCING DUE TO THE COVID-19 PANDEMIC.

## 2022-12-15 NOTE — Assessment & Plan Note (Signed)
Chronic, currently on Eliquis 2.5mg  twice daily due to underlying PAF.

## 2022-12-15 NOTE — Patient Instructions (Signed)

## 2022-12-15 NOTE — Assessment & Plan Note (Signed)
Chronic, currently rate controlled and properly anticoagulated.

## 2022-12-15 NOTE — Assessment & Plan Note (Signed)
Chronic, LDL goal is less than 70. She is currently on atorvastatin 40mg  daily. Encouraged to avoid fried foods, incorporate chair exercises into her daily routine and to aim for at least 7 hours of sleep per night.

## 2022-12-15 NOTE — Assessment & Plan Note (Signed)
She is encouraged to strive for BMI less than 30 to decrease cardiac risk. Advised to aim for at least 150 minutes of exercise per week.  

## 2022-12-15 NOTE — Assessment & Plan Note (Addendum)
Chronic, she has done a great job controlling this without meds. She is encouraged to keep up with regular eye exams. I did review most recent Nephrology note with patient and her son. Reminded to avoid NSAIDs, stay hydrated and keep BP well controlled to decrease risk of CKD progression.

## 2022-12-16 ENCOUNTER — Ambulatory Visit: Payer: Medicare Other | Admitting: Internal Medicine

## 2022-12-17 DIAGNOSIS — I10 Essential (primary) hypertension: Secondary | ICD-10-CM | POA: Diagnosis not present

## 2022-12-17 DIAGNOSIS — Z8679 Personal history of other diseases of the circulatory system: Secondary | ICD-10-CM | POA: Diagnosis not present

## 2022-12-17 DIAGNOSIS — I639 Cerebral infarction, unspecified: Secondary | ICD-10-CM | POA: Diagnosis not present

## 2022-12-17 DIAGNOSIS — I2510 Atherosclerotic heart disease of native coronary artery without angina pectoris: Secondary | ICD-10-CM | POA: Diagnosis not present

## 2022-12-21 DIAGNOSIS — Z7189 Other specified counseling: Secondary | ICD-10-CM | POA: Insufficient documentation

## 2022-12-21 NOTE — Assessment & Plan Note (Signed)
Chronic, well controlled. She will continue with amlodipine 5mg , Imdur, and statin therapy.

## 2022-12-21 NOTE — Assessment & Plan Note (Signed)
I discussed DNR and MOST forms with patient and her son for greater than 5 minutes. They prefer to discuss details with the remainder of the family. They will discuss further and we will finalize at her next visit. She does not wish to be resuscitated. She is not yet sure about fluids, abx, etc.

## 2022-12-21 NOTE — Assessment & Plan Note (Signed)
Chronic, encouraged to increase her daily routine. Advised to perform chair exercises during commercials of her favorite TV show and to follow low sodium diet.

## 2023-01-28 ENCOUNTER — Other Ambulatory Visit: Payer: Self-pay | Admitting: Internal Medicine

## 2023-02-03 DIAGNOSIS — N182 Chronic kidney disease, stage 2 (mild): Secondary | ICD-10-CM | POA: Diagnosis not present

## 2023-02-03 DIAGNOSIS — M25559 Pain in unspecified hip: Secondary | ICD-10-CM | POA: Diagnosis not present

## 2023-02-03 DIAGNOSIS — I129 Hypertensive chronic kidney disease with stage 1 through stage 4 chronic kidney disease, or unspecified chronic kidney disease: Secondary | ICD-10-CM | POA: Diagnosis not present

## 2023-02-03 DIAGNOSIS — I890 Lymphedema, not elsewhere classified: Secondary | ICD-10-CM | POA: Diagnosis not present

## 2023-02-03 DIAGNOSIS — N189 Chronic kidney disease, unspecified: Secondary | ICD-10-CM | POA: Diagnosis not present

## 2023-02-03 DIAGNOSIS — E872 Acidosis, unspecified: Secondary | ICD-10-CM | POA: Diagnosis not present

## 2023-02-03 DIAGNOSIS — E1122 Type 2 diabetes mellitus with diabetic chronic kidney disease: Secondary | ICD-10-CM | POA: Diagnosis not present

## 2023-02-04 LAB — LAB REPORT - SCANNED: EGFR: 14

## 2023-02-19 ENCOUNTER — Ambulatory Visit: Payer: Medicare Other

## 2023-02-19 ENCOUNTER — Ambulatory Visit: Payer: Self-pay | Admitting: Internal Medicine

## 2023-02-19 DIAGNOSIS — Z0000 Encounter for general adult medical examination without abnormal findings: Secondary | ICD-10-CM

## 2023-02-19 NOTE — Progress Notes (Signed)
Subjective:   Rita Yoder is a 82 y.o. female who presents for Medicare Annual (Subsequent) preventive examination.  Visit Complete: Virtual I connected with  Jadelyn Seitz on 02/19/23 by a audio enabled telemedicine application and verified that I am speaking with the correct person using two identifiers. Daughter Jasmine December also on call.  Patient Location: Home  Provider Location: Office/Clinic  I discussed the limitations of evaluation and management by telemedicine. The patient expressed understanding and agreed to proceed.  Vital Signs: Because this visit was a virtual/telehealth visit, some criteria may be missing or patient reported. Any vitals not documented were not able to be obtained and vitals that have been documented are patient reported.  Patient Medicare AWV questionnaire was completed by the patient on 02/18/2023; I have confirmed that all information answered by patient is correct and no changes since this date.  Cardiac Risk Factors include: advanced age (>49men, >46 women);diabetes mellitus;dyslipidemia;hypertension     Objective:    Today's Vitals   There is no height or weight on file to calculate BMI.     02/19/2023    3:22 PM 10/13/2022   11:21 AM 02/13/2022    4:04 PM 08/15/2021    2:11 PM 08/12/2021   10:26 AM 05/02/2021   12:15 PM 04/24/2021    8:00 PM  Advanced Directives  Does Patient Have a Medical Advance Directive? Yes Yes Yes Yes Yes Yes No  Type of Estate agent of [place];Living will  Healthcare Power of [place];Living will Healthcare Power of [place];Living will Healthcare Power of [place];Living will Healthcare Power of Attorney   Does patient want to make changes to medical advance directive?  No - Patient declined  No - Patient declined     Copy of Healthcare Power of Attorney in Chart? No - copy requested  No - copy requested      Would patient like information on creating a medical advance directive?       No  - Patient declined    Current Medications (verified) Outpatient Encounter Medications as of 02/19/2023  Medication Sig   amiodarone (PACERONE) 200 MG tablet TAKE ONE-HALF TABLET BY MOUTH  DAILY   atorvastatin (LIPITOR) 40 MG tablet TAKE 1 TABLET BY MOUTH DAILY   Blood Glucose Calibration (OT ULTRA/FASTTK CNTRL SOLN) SOLN    Cholecalciferol (VITAMIN D3) 50 MCG (2000 UT) TABS Take 2,000 Units by mouth daily.   ELIQUIS 2.5 MG TABS tablet TAKE 1 TABLET BY MOUTH TWICE  DAILY   levETIRAcetam (KEPPRA) 500 MG tablet TAKE 1 TABLET BY MOUTH TWICE  DAILY   levothyroxine (SYNTHROID) 50 MCG tablet TAKE 1 TABLET BY MOUTH DAILY   Menthol-Methyl Salicylate (MUSCLE RUB) 10-15 % CREA Apply 1 Application topically as needed for muscle pain.   nitroGLYCERIN (NITROSTAT) 0.4 MG SL tablet DISSOLVE 1 TABLET UNDER THE TONGUE AT FIRST SIGN OF ATTACKS, MAY REPEAT EVERY 5 MINUTES UP TO 3 TABLETS, IF NO RELLIEF SEEK MEDICAL HELP (Patient taking differently: Place 0.4 mg under the tongue every 5 (five) minutes x 3 doses as needed for chest pain (SEEK HELP, IF NO RELIEF).)   solifenacin (VESICARE) 5 MG tablet Take 5 mg by mouth daily.   amLODipine (NORVASC) 5 MG tablet TAKE 1 TABLET BY MOUTH DAILY (Patient not taking: Reported on 02/19/2023)   isosorbide mononitrate (IMDUR) 60 MG 24 hr tablet TAKE 1 TABLET BY MOUTH DAILY (Patient not taking: Reported on 02/19/2023)   MODERNA COVID-19 BIVAL BOOSTER 50 MCG/0.5ML injection  No facility-administered encounter medications on file as of 02/19/2023.    Allergies (verified) Losartan   History: Past Medical History:  Diagnosis Date   Arthritis    Cancer (HCC)    Cataract    Chronic kidney disease 12/08/2009   Diabetes mellitus 03/10/2008   Dyslipidemia    Hypertension    Monoclonal gammopathy 03/10/2010   Seizures (HCC)    Stroke [facility])    Thyroid disease    Past Surgical History:  Procedure Laterality Date   CATARACT EXTRACTION Bilateral 12/2020    CHOLECYSTECTOMY     EYE SURGERY     HERNIA REPAIR     JOINT REPLACEMENT     ROTATOR CUFF REPAIR  03/10/1992   THYROID SURGERY     TONSILLECTOMY     TOTAL ABDOMINAL HYSTERECTOMY     TUBAL LIGATION     Family History  Problem Relation Age of Onset   Healthy Mother    Heart attack Brother    Heart Problems Maternal Grandmother    Stroke Maternal Grandmother    Cancer Maternal Aunt    Stroke Maternal Aunt    Cancer Maternal Aunt    Cancer Maternal Uncle    Social History   Socioeconomic History   Marital status: Widowed    Spouse name: Not on file   Number of children: 4   Years of education: Not on file   Highest education level: 11th grade  Occupational History   Occupation: retrired  Tobacco Use   Smoking status: Never   Smokeless tobacco: Never  Vaping Use   Vaping status: Never Used  Substance and Sexual Activity   Alcohol use: No   Drug use: Never   Sexual activity: Not Currently  Other Topics Concern   Not on file  Social History Narrative   Lives with her son and daughter in her home   Right handed    Caffeine: tea sometimes    Social Drivers of Corporate investment banker Strain: Low Risk  (02/18/2023)   Overall Financial Resource Strain (CARDIA)    Difficulty of Paying Living Expenses: Not hard at all  Food Insecurity: No Food Insecurity (02/18/2023)   Hunger Vital Sign    Worried About Running Out of Food in the Last Year: Never true    Ran Out of Food in the Last Year: Never true  Transportation Needs: Unknown (02/18/2023)   PRAPARE - Transportation    Lack of Transportation (Medical): No    Lack of Transportation (Non-Medical): Patient declined  Physical Activity: Sufficiently Active (02/18/2023)   Exercise Vital Sign    Days of Exercise per Week: 7 days    Minutes of Exercise per Session: 30 min  Stress: No Stress Concern Present (02/18/2023)   [employer] of Occupational Health - Occupational Stress Questionnaire    Feeling of Stress  : Not at all  Social Connections: Unknown (02/18/2023)   Social Connection and Isolation Panel [NHANES]    Frequency of Communication with Friends and Family: More than three times a week    Frequency of Social Gatherings with Friends and Family: More than three times a week    Attends Religious Services: Patient declined    Database administrator or Organizations: No    Attends Banker Meetings: Never    Marital Status: Widowed    Tobacco Counseling Counseling given: Not Answered   Clinical Intake:  Pre-visit preparation completed: Yes  Pain : No/denies pain     Nutritional Risks:  Nausea/ vomitting/ diarrhea (diarrhea yesterday due to oatmeal) Diabetes: Yes CBG done?: No Did pt. bring in CBG monitor from home?: No  How often do you need to have someone help you when you read instructions, pamphlets, or other written materials from your doctor or pharmacy?: 1 - Never  Interpreter Needed?: No  Information entered by :: NAllen LPN   Activities of Daily Living    02/18/2023    7:44 PM  In your present state of health, do you have any difficulty performing the following activities:  Hearing? 0  Vision? 0  Difficulty concentrating or making decisions? 1  Walking or climbing stairs? 1  Dressing or bathing? 0  Doing errands, shopping? 1  Preparing Food and eating ? N  Using the Toilet? N  In the past six months, have you accidently leaked urine? Y  Do you have problems with loss of bowel control? N  Managing your Medications? Y  Comment daughter manages  Managing your Finances? Y  Comment daughter Chief Executive Officer or managing your Housekeeping? Y  Comment daughter manages    Patient Care Team: Dorothyann Peng, MD as PCP - General (Internal Medicine) [place] [place] (Pediatric Ophthalmology) Salvatore Marvel, MD as Consulting Physician (Orthopedic Surgery)  Indicate any recent Medical Services you may have received from other than [facility] providers  in the past year (date may be approximate).     Assessment:   This is a routine wellness examination for [place].  Hearing/Vision screen Hearing Screening - Comments:: Denies hearing issues Vision Screening - Comments:: Regular eye exams, Dr. Hubbard Robinson   Goals Addressed             This Visit's Progress    Patient Stated       02/19/2023, working on writing       Depression Screen    02/19/2023    3:24 PM 12/15/2022    9:13 AM 10/13/2022   11:20 AM 08/11/2022   11:08 AM 02/13/2022    4:05 PM 02/04/2022   10:25 AM 10/23/2021   10:57 AM  PHQ 2/9 Scores  PHQ - 2 Score 0 0 0 3 0 0 0  PHQ- 9 Score  0  3       Fall Risk    02/18/2023    7:44 PM 12/15/2022    9:13 AM 10/13/2022   11:19 AM 08/11/2022   11:08 AM 02/13/2022    4:04 PM  Fall Risk   Falls in the past year? 1 0 0 1 0  Comment tripped      Number falls in past yr: 0 0  0 0  Injury with Fall? 0 0  0 0  Risk for fall due to : Medication side effect;Impaired mobility;Impaired balance/gait No Fall Risks  History of fall(s) Impaired balance/gait;Impaired mobility;Medication side effect  Follow up Falls prevention discussed;Falls evaluation completed Falls evaluation completed  Falls evaluation completed Falls prevention discussed;Falls evaluation completed;Education provided    MEDICARE RISK AT HOME: Medicare Risk at Home Any stairs in or around the home?: No If so, are there any without handrails?: No Home free of loose throw rugs in walkways, pet beds, electrical cords, etc?: Yes Adequate lighting in your home to reduce risk of falls?: Yes Life alert?: No Use of a cane, walker or w/c?: Yes Grab bars in the bathroom?: Yes Shower chair or bench in shower?: Yes Elevated toilet seat or a handicapped toilet?: Yes  TIMED UP AND GO:  Was the test performed?  No  Cognitive Function:  6 CIT not administered. Daughter states patient has trouble with memory.        02/13/2022    4:06 PM 12/20/2020   12:20 PM  11/30/2019    2:32 PM 11/18/2018    2:31 PM 01/20/2018   11:17 AM  6CIT Screen  What Year? 4 points 0 points 0 points 0 points 0 points  What month? 0 points 0 points 0 points 0 points 0 points  What time? 0 points 0 points 0 points 0 points 0 points  Count back from 20 4 points 0 points 2 points 0 points 0 points  Months in reverse 4 points 4 points 4 points 0 points 0 points  Repeat phrase 10 points 2 points 8 points 0 points 2 points  Total Score 22 points 6 points 14 points 0 points 2 points    Immunizations Immunization History  Administered Date(s) Administered   Fluad Quad(high Dose 65+) 01/07/2021, 02/04/2022   Influenza, High Dose Seasonal PF 11/27/2022, 11/27/2022   Moderna Covid-19 Fall Seasonal Vaccine 42yrs & older 11/27/2022   Moderna Covid-19 Vaccine Bivalent Booster 73yrs & up 02/01/2021   PFIZER(Purple Top)SARS-COV-2 Vaccination 06/04/2019, 06/28/2019, 02/20/2020    TDAP status: Up to date  Flu Vaccine status: Up to date  Pneumococcal vaccine status: Declined,  Education has been provided regarding the importance of this vaccine but patient still declined. Advised may receive this vaccine at local pharmacy or Health Dept. Aware to provide a copy of the vaccination record if obtained from local pharmacy or Health Dept. Verbalized acceptance and understanding.   Covid-19 vaccine status: Completed vaccines  Qualifies for Shingles Vaccine? Yes   Zostavax completed No   Shingrix Completed?: No.    Education has been provided regarding the importance of this vaccine. Patient has been advised to call insurance company to determine out of pocket expense if they have not yet received this vaccine. Advised may also receive vaccine at local pharmacy or Health Dept. Verbalized acceptance and understanding.  Screening Tests Health Maintenance  Topic Date Due   OPHTHALMOLOGY EXAM  02/11/2020   FOOT EXAM  02/28/2022   COVID-19 Vaccine (6 - 2024-25 season) 01/22/2023    HEMOGLOBIN A1C  02/10/2023   Zoster Vaccines- Shingrix (1 of 2) 03/17/2023 (Originally 01/17/1960)   Pneumonia Vaccine 40+ Years old (1 of 2 - PCV) 08/11/2023 (Originally 01/16/1947)   Diabetic kidney evaluation - Urine ACR  08/11/2023   Diabetic kidney evaluation - eGFR measurement  02/04/2024   Medicare Annual Wellness (AWV)  02/19/2024   INFLUENZA VACCINE  Completed   DEXA SCAN  Completed   HPV VACCINES  Aged Out   DTaP/Tdap/Td  Discontinued    Health Maintenance  Health Maintenance Due  Topic Date Due   OPHTHALMOLOGY EXAM  02/11/2020   FOOT EXAM  02/28/2022   COVID-19 Vaccine (6 - 2024-25 season) 01/22/2023   HEMOGLOBIN A1C  02/10/2023    Colorectal cancer screening: No longer required.   Mammogram status: No longer required due to age.  Bone Density status: Completed 02/23/2017.   Lung Cancer Screening: (Low Dose CT Chest recommended if Age 80-80 years, 20 pack-year currently smoking OR have quit w/in 15years.) does not qualify.   Lung Cancer Screening Referral: no  Additional Screening:  Hepatitis C Screening: does not qualify;   Vision Screening: Recommended annual ophthalmology exams for early detection of glaucoma and other disorders of the eye. Is the patient up to date with their annual eye exam?  Yes  Who is the provider or what is the name of the office in which the patient attends annual eye exams? Dr. Hubbard Robinson If pt is not established with a provider, would they like to be referred to a provider to establish care? No .   Dental Screening: Recommended annual dental exams for proper oral hygiene  Diabetic Foot Exam: Diabetic Foot Exam: Overdue, Pt has been advised about the importance in completing this exam. Pt is scheduled for diabetic foot exam on next appointment.  Community Resource Referral / Chronic Care Management: CRR required this visit?  No   CCM required this visit?  No     Plan:     I have personally reviewed and noted the following in  the patient's chart:   Medical and social history Use of alcohol, tobacco or illicit drugs  Current medications and supplements including opioid prescriptions. Patient is not currently taking opioid prescriptions. Functional ability and status Nutritional status Physical activity Advanced directives List of other physicians Hospitalizations, surgeries, and ER visits in previous 12 months Vitals Screenings to include cognitive, depression, and falls Referrals and appointments  In addition, I have reviewed and discussed with patient certain preventive protocols, quality metrics, and best practice recommendations. A written personalized care plan for preventive services as well as general preventive health recommendations were provided to patient.     Barb Merino, LPN   16/12/9602   After Visit Summary: (MyChart) Due to this being a telephonic visit, the after visit summary with patients personalized plan was offered to patient via MyChart   Nurse Notes: none

## 2023-02-19 NOTE — Patient Instructions (Signed)
Ms. Nilges , Thank you for taking time to come for your Medicare Wellness Visit. I appreciate your ongoing commitment to your health goals. Please review the following plan we discussed and let me know if I can assist you in the future.   Referrals/Orders/Follow-Ups/Clinician Recommendations: none  This is a list of the screening recommended for you and due dates:  Health Maintenance  Topic Date Due   Eye exam for diabetics  02/11/2020   Complete foot exam   02/28/2022   COVID-19 Vaccine (6 - 2024-25 season) 01/22/2023   Hemoglobin A1C  02/10/2023   Zoster (Shingles) Vaccine (1 of 2) 03/17/2023*   Pneumonia Vaccine (1 of 2 - PCV) 08/11/2023*   Yearly kidney health urinalysis for diabetes  08/11/2023   Yearly kidney function blood test for diabetes  02/04/2024   Medicare Annual Wellness Visit  02/19/2024   Flu Shot  Completed   DEXA scan (bone density measurement)  Completed   HPV Vaccine  Aged Out   DTaP/Tdap/Td vaccine  Discontinued  *Topic was postponed. The date shown is not the original due date.    Advanced directives: (Copy Requested) Please bring a copy of your health care power of attorney and living will to the office to be added to your chart at your convenience.  Next Medicare Annual Wellness Visit scheduled for next year: No, office will schedule  Insert Preventive Care attachment Insert FALL PREVENTION attachment if needed

## 2023-03-15 NOTE — Progress Notes (Deleted)
 PATIENT: Rita Yoder DOB: [birth year]  REASON FOR VISIT: follow up HISTORY FROM: patient PRIMARY NEUROLOGIST: Dr. Rosemarie  No chief complaint on file.    HISTORY OF PRESENT ILLNESS: Today 03/15/23:  Rita Yoder is a 83 y.o. female with a history of Stroke and seizure. Returns today for follow-up.      03/17/22: Rita Yoder is a 83 y.o. female who has been followed in this office for stroke. Returns today for follow-up.  She reports that in November she went to the hospital for facial droop and slurred speech.  She had been off of Eliquis  for several days due to having trouble getting up in the mail order pharmacy.  She states that since then she has not had any additional strokelike symptoms.  Her son reports that they are getting her medicine through a local mail order pharmacy.  They have not had any trouble getting her medication.  PCP is managing blood pressure cholesterol and diabetes.  She states that she has not had any seizure events.  Remains on Keppra    01/23/22: ( Copied from Hospital note) Rita Yoder is a 83 y.o. female with medical history significant for prior CVA in February 2023, paroxysmal A-fib on Eliquis , essential hypertension, hyperlipidemia, hypothyroidism, CKD 4, HFpEF 60-65% grade 1 diastolic dysfunction, history of subarachnoid hemorrhage on Keppra , who presented to [facility] ED from home for facial droop and slurred speech.     Patient admitted as above with concern over acute stroke given recurrent episodes of slurred speech and facial droop.  MRI did confirm an acute to subacute infarct of the left parietal lobe.  Patient has had issues getting her medications filled via mail order and subsequently has missed multiple doses of Eliquis  likely the etiology for her acute stroke in the setting of paroxysmal A-fib.  Neurology evaluated, EEG unremarkable, otherwise patient stable and agreeable for discharge home.  No medication changes, discussed  the importance of medication compliance, if she were to run out of medications again via mail order she can always call PCP and get local refills for emergency use without incurring extra costs.  Family and patient was unaware this was an option.  Follow-up with PCP in the next 1 to 2 weeks for routine evaluation post Hospital care.  Continue home health PT, discussed increasing activity levels as well as improvement in diet and lifestyle.   HISTORY Rita Yoder is a 84 year old pleasant African-American lady seen today for initial office follow-up visit following hospital admission for stroke in February 2023.  She is accompanied by her daughter today.  History is obtained from them and review of electronic medical records.  I have personally reviewed pertinent available imaging films in PACS.  She has past medical history of diabetes, hypertension, chronic kidney disease who presented on 04/23/2021 with word finding difficulties and right arm weakness and tingling.  In the waiting room she got worse with gaze deviation and not speaking and so code stroke was activated.  Her symptoms resolved by the time she was seen and returned from the CT scanner.  Blood pressure was found to be elevated at 200 systolic.  Noncontrast CT scan of the head showed hypodensity in the left thalamus compatible with age-indeterminate infarct however MRI scan showed small patchy acute cortical and subcortical infarcts in the left parietal lobe with changes of small vessel disease.  There was also changes of remote age subarachnoid hemorrhage microhemorrhage in the right parieto-occipital lobe raising question for possible  amyloid angiopathy.  MR angiogram of the brain showed no large vessel stenosis or occlusion.  2D echo showed ejection fraction of 60 to 65% with mild left ventricular hypertrophy and and left atrial dilatation.  LDL cholesterol 78 mg percent.  Hemoglobin A1c is 4.5.  Patient was subsequently found to have be in atrial  fibrillation.  She was on aspirin  prior to admission and subsequently was switched to Eliquis  2.5 twice daily.  Patient had episode of right upper extremity twitching's which were diagnosed as focal seizure and she was started on Keppra .  EEG could not be completed as patient had a glued and weakness which could not be removed.  Patient states she is done well since discharge.  Her speech has improved though she still has some word hesitancy and has some good days and bad days with her speech.  Right upper extremity is improving but she still has significant weakness of right grip and intrinsic hand muscles and fine motor skills are diminished.  She is ambulating better but does use a wheeled walker all the times.  She has had no falls or injuries.  She is currently doing outpatient physical and speech therapy.  She has no new complaints today.   REVIEW OF SYSTEMS: Out of a complete 14 system review of symptoms, the patient complains only of the following symptoms, and all other reviewed systems are negative.  ALLERGIES: Allergies  Allergen Reactions   Losartan  Other (See Comments)    MD discontinued this because of decreased kidney function    HOME MEDICATIONS: Outpatient Medications Prior to Visit  Medication Sig Dispense Refill   amiodarone  (PACERONE ) 200 MG tablet TAKE ONE-HALF TABLET BY MOUTH  DAILY 50 tablet 2   amLODipine  (NORVASC ) 5 MG tablet TAKE 1 TABLET BY MOUTH DAILY (Patient not taking: Reported on 02/19/2023) 100 tablet 2   atorvastatin  (LIPITOR) 40 MG tablet TAKE 1 TABLET BY MOUTH DAILY 100 tablet 2   Blood Glucose Calibration (OT ULTRA/FASTTK CNTRL SOLN) SOLN      Cholecalciferol  (VITAMIN D3) 50 MCG (2000 UT) TABS Take 2,000 Units by mouth daily. 30 tablet 0   ELIQUIS  2.5 MG TABS tablet TAKE 1 TABLET BY MOUTH TWICE  DAILY 200 tablet 2   isosorbide  mononitrate (IMDUR ) 60 MG 24 hr tablet TAKE 1 TABLET BY MOUTH DAILY (Patient not taking: Reported on 02/19/2023) 100 tablet 2    levETIRAcetam  (KEPPRA ) 500 MG tablet TAKE 1 TABLET BY MOUTH TWICE  DAILY 200 tablet 2   levothyroxine  (SYNTHROID ) 50 MCG tablet TAKE 1 TABLET BY MOUTH DAILY 100 tablet 2   Menthol -Methyl Salicylate  (MUSCLE RUB) 10-15 % CREA Apply 1 Application topically as needed for muscle pain.     MODERNA COVID-19 BIVAL BOOSTER 50 MCG/0.5ML injection      nitroGLYCERIN  (NITROSTAT ) 0.4 MG SL tablet DISSOLVE 1 TABLET UNDER THE TONGUE AT FIRST SIGN OF ATTACKS, MAY REPEAT EVERY 5 MINUTES UP TO 3 TABLETS, IF NO RELLIEF SEEK MEDICAL HELP (Patient taking differently: Place 0.4 mg under the tongue every 5 (five) minutes x 3 doses as needed for chest pain (SEEK HELP, IF NO RELIEF).) 25 tablet 1   solifenacin (VESICARE) 5 MG tablet Take 5 mg by mouth daily.     No facility-administered medications prior to visit.    PAST MEDICAL HISTORY: Past Medical History:  Diagnosis Date   Arthritis    Cancer [facility])    Cataract    Chronic kidney disease 12/08/2009   Diabetes mellitus 03/10/2008   Dyslipidemia  Hypertension    Monoclonal gammopathy 03/10/2010   Seizures (HCC)    Stroke (HCC)    Thyroid  disease     PAST SURGICAL HISTORY: Past Surgical History:  Procedure Laterality Date   CATARACT EXTRACTION Bilateral 12/2020   CHOLECYSTECTOMY     EYE SURGERY     HERNIA REPAIR     JOINT REPLACEMENT     ROTATOR CUFF REPAIR  03/10/1992   THYROID  SURGERY     TONSILLECTOMY     TOTAL ABDOMINAL HYSTERECTOMY     TUBAL LIGATION      FAMILY HISTORY: Family History  Problem Relation Age of Onset   Healthy Mother    Heart attack Brother    Heart Problems Maternal Grandmother    Stroke Maternal Grandmother    Cancer Maternal Aunt    Stroke Maternal Aunt    Cancer Maternal Aunt    Cancer Maternal Uncle     SOCIAL HISTORY: Social History   Socioeconomic History   Marital status: Widowed    Spouse name: Not on file   Number of children: 4   Years of education: Not on file   Highest education level: 11th  grade  Occupational History   Occupation: retrired  Tobacco Use   Smoking status: Never   Smokeless tobacco: Never  Vaping Use   Vaping status: Never Used  Substance and Sexual Activity   Alcohol use: No   Drug use: Never   Sexual activity: Not Currently  Other Topics Concern   Not on file  Social History Narrative   Lives with her son and daughter in her home   Right handed    Caffeine: tea sometimes    Social Drivers of Corporate Investment Banker Strain: Low Risk  (02/18/2023)   Overall Financial Resource Strain (CARDIA)    Difficulty of Paying Living Expenses: Not hard at all  Food Insecurity: No Food Insecurity (02/18/2023)   Hunger Vital Sign    Worried About Running Out of Food in the Last Year: Never true    Ran Out of Food in the Last Year: Never true  Transportation Needs: Unknown (02/18/2023)   PRAPARE - Transportation    Lack of Transportation (Medical): No    Lack of Transportation (Non-Medical): Patient declined  Physical Activity: Sufficiently Active (02/18/2023)   Exercise Vital Sign    Days of Exercise per Week: 7 days    Minutes of Exercise per Session: 30 min  Stress: No Stress Concern Present (02/18/2023)   [employer] of Occupational Health - Occupational Stress Questionnaire    Feeling of Stress : Not at all  Social Connections: Unknown (02/18/2023)   Social Connection and Isolation Panel [NHANES]    Frequency of Communication with Friends and Family: More than three times a week    Frequency of Social Gatherings with Friends and Family: More than three times a week    Attends Religious Services: Patient declined    Database Administrator or Organizations: No    Attends Banker Meetings: Never    Marital Status: Widowed  Intimate Partner Violence: Not At Risk (02/19/2023)   Humiliation, Afraid, Rape, and Kick questionnaire    Fear of Current or Ex-Partner: No    Emotionally Abused: No    Physically Abused: No    Sexually  Abused: No      PHYSICAL EXAM  There were no vitals filed for this visit.  There is no height or weight on file to calculate BMI.  Generalized:  Well developed, in no acute distress   Neurological examination  Mentation: Alert oriented to time, place, history taking. Follows all commands speech and language fluent Cranial nerve II-XII: Pupils were equal round reactive to light. Extraocular movements were full, visual field were full on confrontational test. Facial sensation and strength were normal.Head turning and shoulder shrug  were normal and symmetric. Motor: The motor testing reveals 5 over 5 strength of all 4 extremities. Good symmetric motor tone is noted throughout.  Sensory: Sensory testing is intact to soft touch on all 4 extremities. No evidence of extinction is noted.  Coordination: Cerebellar testing reveals good finger-nose-finger and heel-to-shin bilaterally.  Gait and station: Gait is normal.  Reflexes: Deep tendon reflexes are symmetric and normal bilaterally.   DIAGNOSTIC DATA (LABS, IMAGING, TESTING) - I reviewed patient records, labs, notes, testing and imaging myself where available.  Lab Results  Component Value Date   WBC 9.4 08/11/2022   HGB 9.6 (L) 08/11/2022   HCT 30.7 (L) 08/11/2022   MCV 98 (H) 08/11/2022   PLT 305 08/11/2022      Component Value Date/Time   NA 143 08/11/2022 1205   K 4.8 08/11/2022 1205   CL 111 (H) 08/11/2022 1205   CO2 18 (L) 08/11/2022 1205   GLUCOSE 82 08/11/2022 1205   GLUCOSE 85 01/23/2022 0655   BUN 39 (H) 08/11/2022 1205   CREATININE 3.29 (H) 08/11/2022 1205   CREATININE 2.85 (H) 03/25/2021 1354   CALCIUM  9.1 08/11/2022 1205   PROT 6.3 08/11/2022 1205   ALBUMIN 3.9 08/11/2022 1205   AST 16 08/11/2022 1205   AST 14 (L) 03/25/2021 1354   ALT 23 08/11/2022 1205   ALT 12 03/25/2021 1354   ALKPHOS 123 (H) 08/11/2022 1205   BILITOT 0.3 08/11/2022 1205   BILITOT 0.5 03/25/2021 1354   GFRNONAA 14 (L) 01/23/2022 0655    GFRNONAA 16 (L) 03/25/2021 1354   GFRAA 23 (L) 02/20/2020 1615   Lab Results  Component Value Date   CHOL 122 08/11/2022   HDL 39 (L) 08/11/2022   LDLCALC 63 08/11/2022   TRIG 105 08/11/2022   CHOLHDL 3.1 08/11/2022   Lab Results  Component Value Date   HGBA1C 5.2 08/11/2022   Lab Results  Component Value Date   VITAMINB12 535 08/20/2020   Lab Results  Component Value Date   TSH 3.440 09/29/2022      ASSESSMENT AND PLAN 83 y.o. year old female  has a past medical history of Arthritis, Cancer (HCC), Cataract, Chronic kidney disease (12/08/2009), Diabetes mellitus (03/10/2008), Dyslipidemia, Hypertension, Monoclonal gammopathy (03/10/2010), Seizures (HCC), Stroke (HCC), and Thyroid  disease. here with:  1.  Seizures  Continue Keppra  500 mg twice a day  2.  History of stroke  Continue Eliquis  2.5 mg twice a day (prescribed by PCP) Keep good control of blood pressure, cholesterol and diabetes   Follow-up in 1 year or sooner if needed     Duwaine Russell, MSN, NP-C 03/15/2023, 6:37 PM [facility] [address], [address] [place], [place] [address] [phone]

## 2023-03-16 ENCOUNTER — Ambulatory Visit: Payer: Medicare Other | Admitting: Adult Health

## 2023-03-16 ENCOUNTER — Telehealth: Payer: Self-pay | Admitting: Adult Health

## 2023-03-16 NOTE — Telephone Encounter (Signed)
 Pt's daughter cancelled appt due to patient afraid she will fall on the ice.

## 2023-03-17 ENCOUNTER — Other Ambulatory Visit (HOSPITAL_COMMUNITY): Payer: Self-pay

## 2023-03-24 ENCOUNTER — Encounter: Payer: Self-pay | Admitting: Internal Medicine

## 2023-03-25 ENCOUNTER — Ambulatory Visit: Payer: Medicare Other | Admitting: Internal Medicine

## 2023-03-25 NOTE — Progress Notes (Deleted)
I,Sloan Galentine T Deloria Lair, CMA,acting as a Neurosurgeon for [name], MD.,have documented all relevant documentation on the behalf of [name], MD,as directed by  [name], MD while in the presence of [name], MD.  Subjective:  Patient ID: Rita Yoder , female    DOB: 1940/08/24 , 83 y.o.   MRN: [id number]  No chief complaint on file.   HPI  She presents today for diabetes and BP check.  She is accompanied by her son today. She reports compliance with meds. She denies headaches, chest pain and shortness of breath.     Diabetes She presents for her follow-up diabetic visit. She has type 2 diabetes mellitus. Her disease course has been stable. There are no hypoglycemic associated symptoms. There are no diabetic associated symptoms. Pertinent negatives for diabetes include no polydipsia, no polyphagia and no polyuria. Diabetic complications include nephropathy. Risk factors for coronary artery disease include post-menopausal, sedentary lifestyle and hypertension. Current diabetic treatment includes diet.  Hypertension This is a chronic problem. The current episode started more than 1 year ago. The problem has been gradually improving since onset. The problem is controlled. The current treatment provides moderate improvement. Hypertensive end-organ damage includes kidney disease.     Past Medical History:  Diagnosis Date   Arthritis    Cancer (HCC)    Cataract    Chronic kidney disease 12/08/2009   Diabetes mellitus 03/10/2008   Dyslipidemia    Hypertension    Monoclonal gammopathy 03/10/2010   Seizures (HCC)    Stroke (HCC)    Thyroid disease      Family History  Problem Relation Age of Onset   Healthy Mother    Heart attack Brother    Heart Problems Maternal Grandmother    Stroke Maternal Grandmother    Cancer Maternal Aunt    Stroke Maternal Aunt    Cancer Maternal Aunt    Cancer Maternal Uncle      Current Outpatient Medications:    amiodarone  (PACERONE) 200 MG tablet, TAKE ONE-HALF TABLET BY MOUTH  DAILY, Disp: 50 tablet, Rfl: 2   amLODipine (NORVASC) 5 MG tablet, TAKE 1 TABLET BY MOUTH DAILY (Patient not taking: Reported on 02/19/2023), Disp: 100 tablet, Rfl: 2   atorvastatin (LIPITOR) 40 MG tablet, TAKE 1 TABLET BY MOUTH DAILY, Disp: 100 tablet, Rfl: 2   Blood Glucose Calibration (OT ULTRA/FASTTK CNTRL SOLN) SOLN, , Disp: , Rfl:    Cholecalciferol (VITAMIN D3) 50 MCG (2000 UT) TABS, Take 2,000 Units by mouth daily., Disp: 30 tablet, Rfl: 0   ELIQUIS 2.5 MG TABS tablet, TAKE 1 TABLET BY MOUTH TWICE  DAILY, Disp: 200 tablet, Rfl: 2   isosorbide mononitrate (IMDUR) 60 MG 24 hr tablet, TAKE 1 TABLET BY MOUTH DAILY (Patient not taking: Reported on 02/19/2023), Disp: 100 tablet, Rfl: 2   levETIRAcetam (KEPPRA) 500 MG tablet, TAKE 1 TABLET BY MOUTH TWICE  DAILY, Disp: 200 tablet, Rfl: 2   levothyroxine (SYNTHROID) 50 MCG tablet, TAKE 1 TABLET BY MOUTH DAILY, Disp: 100 tablet, Rfl: 2   Menthol-Methyl Salicylate (MUSCLE RUB) 10-15 % CREA, Apply 1 Application topically as needed for muscle pain., Disp: , Rfl:    MODERNA COVID-19 BIVAL BOOSTER 50 MCG/0.5ML injection, , Disp: , Rfl:    nitroGLYCERIN (NITROSTAT) 0.4 MG SL tablet, DISSOLVE 1 TABLET UNDER THE TONGUE AT FIRST SIGN OF ATTACKS, MAY REPEAT EVERY 5 MINUTES UP TO 3 TABLETS, IF NO RELLIEF SEEK MEDICAL HELP (Patient taking differently: Place 0.4 mg  under the tongue every 5 (five) minutes x 3 doses as needed for chest pain (SEEK HELP, IF NO RELIEF).), Disp: 25 tablet, Rfl: 1   solifenacin (VESICARE) 5 MG tablet, Take 5 mg by mouth daily., Disp: , Rfl:    Allergies  Allergen Reactions   Losartan Other (See Comments)    MD discontinued this because of decreased kidney function     Review of Systems  Constitutional: Negative.   Respiratory: Negative.    Cardiovascular: Negative.   Endocrine: Negative for polydipsia, polyphagia and polyuria.  Neurological: Negative.    Psychiatric/Behavioral: Negative.       There were no vitals filed for this visit. There is no height or weight on file to calculate BMI.  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  Patient was given opportunity to ask questions. Patient verbalized understanding of the plan and was able to repeat key elements of the plan. All questions were answered to their satisfaction.  [name], MD  I, [name], MD, have reviewed all documentation for this visit. The documentation on 03/25/23 for the exam, diagnosis, procedures, and orders are all accurate and complete.   IF YOU HAVE BEEN REFERRED TO A SPECIALIST, IT MAY TAKE 1-2 WEEKS TO SCHEDULE/PROCESS THE REFERRAL. IF YOU HAVE NOT HEARD FROM US/SPECIALIST IN TWO WEEKS, PLEASE GIVE [place] A CALL AT [phone].   THE PATIENT IS ENCOURAGED TO PRACTICE SOCIAL DISTANCING DUE TO THE COVID-19 PANDEMIC.

## 2023-03-26 ENCOUNTER — Other Ambulatory Visit: Payer: Self-pay | Admitting: Internal Medicine

## 2023-05-04 ENCOUNTER — Other Ambulatory Visit: Payer: Self-pay | Admitting: Internal Medicine

## 2023-05-07 DIAGNOSIS — N182 Chronic kidney disease, stage 2 (mild): Secondary | ICD-10-CM | POA: Diagnosis not present

## 2023-05-07 DIAGNOSIS — I129 Hypertensive chronic kidney disease with stage 1 through stage 4 chronic kidney disease, or unspecified chronic kidney disease: Secondary | ICD-10-CM | POA: Diagnosis not present

## 2023-05-07 DIAGNOSIS — E1122 Type 2 diabetes mellitus with diabetic chronic kidney disease: Secondary | ICD-10-CM | POA: Diagnosis not present

## 2023-05-07 DIAGNOSIS — I890 Lymphedema, not elsewhere classified: Secondary | ICD-10-CM | POA: Diagnosis not present

## 2023-05-07 DIAGNOSIS — N189 Chronic kidney disease, unspecified: Secondary | ICD-10-CM | POA: Diagnosis not present

## 2023-05-07 DIAGNOSIS — E872 Acidosis, unspecified: Secondary | ICD-10-CM | POA: Diagnosis not present

## 2023-05-07 DIAGNOSIS — M25559 Pain in unspecified hip: Secondary | ICD-10-CM | POA: Diagnosis not present

## 2023-05-09 LAB — LAB REPORT - SCANNED: EGFR: 11

## 2023-07-20 ENCOUNTER — Encounter: Payer: Self-pay | Admitting: Adult Health

## 2023-07-20 ENCOUNTER — Ambulatory Visit: Payer: Medicare Other | Admitting: Adult Health

## 2023-07-20 VITALS — BP 127/60 | HR 58 | Ht 60.0 in | Wt 177.0 lb

## 2023-07-20 DIAGNOSIS — Z8673 Personal history of transient ischemic attack (TIA), and cerebral infarction without residual deficits: Secondary | ICD-10-CM

## 2023-07-20 DIAGNOSIS — R569 Unspecified convulsions: Secondary | ICD-10-CM | POA: Diagnosis not present

## 2023-07-20 NOTE — Progress Notes (Signed)
 PATIENT: Rita Yoder DOB: November 25, 1940  REASON FOR VISIT: follow up HISTORY FROM: patient PRIMARY NEUROLOGIST: Dr. Janett Medin  Chief Complaint  Patient presents with   RM 5    Patient is here with her daughter for yearly follow-up. Pt/daughter deny any new strokes or any seizures since the last visit. She remains on keppra  500 mg BID and eliquis  2.5 mg BID. Daughter states she has been complaining about leg pain. Patient is pointing to her left shin. Daughter states pcp saw her but did not order any pain medication.     HISTORY OF PRESENT ILLNESS: Today 07/20/23:  Rita Yoder is a 83 y.o. female with a history of stroke and seizures. Returns today for follow-up.  She is here with her daughter.  Daughter reports that she still has good days and bad days when it comes to her speech and getting her words out.  This has been ongoing since her stroke.  Daughter also notices some trouble with her memory since the stroke.  She lives at home with her daughter and son.  She is able to complete all ADLs independently.  She denies any additional strokelike symptoms.  Denies any seizure events.  Remains on Keppra  500 mg twice a day.  Remains on Eliquis .  Denies missing any dosages.  Returns today for an evaluation.   03/17/22: Rita Yoder is a 83 y.o. female who has been followed in this office for stroke. Returns today for follow-up.  She reports that in November she went to the hospital for facial droop and slurred speech.  She had been off of Eliquis  for several days due to having trouble getting up in the mail order pharmacy.  She states that since then she has not had any additional strokelike symptoms.  Her son reports that they are getting her medicine through a local mail order pharmacy.  They have not had any trouble getting her medication.  PCP is managing blood pressure cholesterol and diabetes.  She states that she has not had any seizure events.  Remains on Keppra    01/23/22:  ( Copied from Hospital note) Rita Yoder is a 83 y.o. female with medical history significant for prior CVA in February 2023, paroxysmal A-fib on Eliquis , essential hypertension, hyperlipidemia, hypothyroidism, CKD 4, HFpEF 60-65% grade 1 diastolic dysfunction, history of subarachnoid hemorrhage on Keppra , who presented to [facility] ED from home for facial droop and slurred speech.     Patient admitted as above with concern over acute stroke given recurrent episodes of slurred speech and facial droop.  MRI did confirm an acute to subacute infarct of the left parietal lobe.  Patient has had issues getting her medications filled via mail order and subsequently has missed multiple doses of Eliquis  likely the etiology for her acute stroke in the setting of paroxysmal A-fib.  Neurology evaluated, EEG unremarkable, otherwise patient stable and agreeable for discharge home.  No medication changes, discussed the importance of medication compliance, if she were to run out of medications again via mail order she can always call PCP and get local refills for emergency use without incurring extra costs.  Family and patient was unaware this was an option.  Follow-up with PCP in the next 1 to 2 weeks for routine evaluation post Hospital care.  Continue home health PT, discussed increasing activity levels as well as improvement in diet and lifestyle.   HISTORY Rita Yoder is a 83 year old pleasant African-American lady seen today for initial office follow-up  visit following hospital admission for stroke in February 2023.  She is accompanied by her daughter today.  History is obtained from them and review of electronic medical records.  I have personally reviewed pertinent available imaging films in PACS.  She has past medical history of diabetes, hypertension, chronic kidney disease who presented on 04/23/2021 with word finding difficulties and right arm weakness and tingling.  In the waiting room she got worse with gaze  deviation and not speaking and so code stroke was activated.  Her symptoms resolved by the time she was seen and returned from the CT scanner.  Blood pressure was found to be elevated at 200 systolic.  Noncontrast CT scan of the head showed hypodensity in the left thalamus compatible with age-indeterminate infarct however MRI scan showed small patchy acute cortical and subcortical infarcts in the left parietal lobe with changes of small vessel disease.  There was also changes of remote age subarachnoid hemorrhage microhemorrhage in the right parieto-occipital lobe raising question for possible amyloid angiopathy.  MR angiogram of the brain showed no large vessel stenosis or occlusion.  2D echo showed ejection fraction of 60 to 65% with mild left ventricular hypertrophy and and left atrial dilatation.  LDL cholesterol 78 mg percent.  Hemoglobin A1c is 4.5.  Patient was subsequently found to have be in atrial fibrillation.  She was on aspirin  prior to admission and subsequently was switched to Eliquis  2.5 twice daily.  Patient had episode of right upper extremity twitching's which were diagnosed as focal seizure and she was started on Keppra .  EEG could not be completed as patient had a glued and weakness which could not be removed.  Patient states she is done well since discharge.  Her speech has improved though she still has some word hesitancy and has some good days and bad days with her speech.  Right upper extremity is improving but she still has significant weakness of right grip and intrinsic hand muscles and fine motor skills are diminished.  She is ambulating better but does use a wheeled walker all the times.  She has had no falls or injuries.  She is currently doing outpatient physical and speech therapy.  She has no new complaints today.   REVIEW OF SYSTEMS: Out of a complete 14 system review of symptoms, the patient complains only of the following symptoms, and all other reviewed systems are  negative.  ALLERGIES: Allergies  Allergen Reactions   Losartan  Other (See Comments)    MD discontinued this because of decreased kidney function    HOME MEDICATIONS: Outpatient Medications Prior to Visit  Medication Sig Dispense Refill   amiodarone  (PACERONE ) 200 MG tablet TAKE ONE-HALF TABLET BY MOUTH  DAILY 50 tablet 2   amLODipine  (NORVASC ) 5 MG tablet TAKE 1 TABLET BY MOUTH DAILY 100 tablet 2   atorvastatin  (LIPITOR) 40 MG tablet TAKE 1 TABLET BY MOUTH DAILY 100 tablet 2   Blood Glucose Calibration (OT ULTRA/FASTTK CNTRL SOLN) SOLN      Cholecalciferol  (VITAMIN D3) 50 MCG (2000 UT) TABS Take 2,000 Units by mouth daily. 30 tablet 0   ELIQUIS  2.5 MG TABS tablet TAKE 1 TABLET BY MOUTH TWICE  DAILY 200 tablet 2   isosorbide  mononitrate (IMDUR ) 60 MG 24 hr tablet TAKE 1 TABLET BY MOUTH DAILY 100 tablet 2   levETIRAcetam  (KEPPRA ) 500 MG tablet TAKE 1 TABLET BY MOUTH TWICE  DAILY 200 tablet 2   levothyroxine  (SYNTHROID ) 50 MCG tablet TAKE 1 TABLET BY MOUTH DAILY 100  tablet 2   Menthol -Methyl Salicylate  (MUSCLE RUB) 10-15 % CREA Apply 1 Application topically as needed for muscle pain.     MODERNA COVID-19 BIVAL BOOSTER 50 MCG/0.5ML injection      nitroGLYCERIN  (NITROSTAT ) 0.4 MG SL tablet DISSOLVE 1 TABLET UNDER THE TONGUE AT FIRST SIGN OF ATTACKS, MAY REPEAT EVERY 5 MINUTES UP TO 3 TABLETS, IF NO RELLIEF SEEK MEDICAL HELP (Patient taking differently: Place 0.4 mg under the tongue every 5 (five) minutes x 3 doses as needed for chest pain (SEEK HELP, IF NO RELIEF).) 25 tablet 1   solifenacin (VESICARE) 5 MG tablet Take 5 mg by mouth daily.     No facility-administered medications prior to visit.    PAST MEDICAL HISTORY: Past Medical History:  Diagnosis Date   Arthritis    Cancer (HCC)    Cataract    Chronic kidney disease 12/08/2009   Diabetes mellitus 03/10/2008   Dyslipidemia    Hypertension    Monoclonal gammopathy 03/10/2010   Seizures (HCC)    Stroke (HCC)    Thyroid  disease      PAST SURGICAL HISTORY: Past Surgical History:  Procedure Laterality Date   CATARACT EXTRACTION Bilateral 12/2020   CHOLECYSTECTOMY     EYE SURGERY     HERNIA REPAIR     JOINT REPLACEMENT     ROTATOR CUFF REPAIR  03/10/1992   THYROID  SURGERY     TONSILLECTOMY     TOTAL ABDOMINAL HYSTERECTOMY     TUBAL LIGATION      FAMILY HISTORY: Family History  Problem Relation Age of Onset   Healthy Mother    Heart attack Brother    Heart Problems Maternal Grandmother    Stroke Maternal Grandmother    Cancer Maternal Aunt    Stroke Maternal Aunt    Cancer Maternal Aunt    Cancer Maternal Uncle     SOCIAL HISTORY: Social History   Socioeconomic History   Marital status: Widowed    Spouse name: Not on file   Number of children: 4   Years of education: Not on file   Highest education level: 11th grade  Occupational History   Occupation: retrired  Tobacco Use   Smoking status: Never   Smokeless tobacco: Never  Vaping Use   Vaping status: Never Used  Substance and Sexual Activity   Alcohol use: No   Drug use: Never   Sexual activity: Not Currently  Other Topics Concern   Not on file  Social History Narrative   Lives with her son and daughter in her home   Right handed    Caffeine: tea sometimes    Social Drivers of Corporate investment banker Strain: Low Risk  (03/23/2023)   Overall Financial Resource Strain (CARDIA)    Difficulty of Paying Living Expenses: Not hard at all  Food Insecurity: No Food Insecurity (03/23/2023)   Hunger Vital Sign    Worried About Running Out of Food in the Last Year: Never true    Ran Out of Food in the Last Year: Never true  Transportation Needs: No Transportation Needs (03/23/2023)   PRAPARE - Administrator, Civil Service (Medical): No    Lack of Transportation (Non-Medical): No  Physical Activity: Unknown (03/23/2023)   Exercise Vital Sign    Days of Exercise per Week: Patient declined    Minutes of Exercise per  Session: 30 min  Stress: No Stress Concern Present (03/23/2023)   [employer] of Occupational Health - Occupational Stress Questionnaire  Feeling of Stress : Only a little  Social Connections: Socially Isolated (03/23/2023)   Social Connection and Isolation Panel [NHANES]    Frequency of Communication with Friends and Family: More than three times a week    Frequency of Social Gatherings with Friends and Family: More than three times a week    Attends Religious Services: Never    Database administrator or Organizations: No    Attends Banker Meetings: Never    Marital Status: Widowed  Intimate Partner Violence: Not At Risk (02/19/2023)   Humiliation, Afraid, Rape, and Kick questionnaire    Fear of Current or Ex-Partner: No    Emotionally Abused: No    Physically Abused: No    Sexually Abused: No      PHYSICAL EXAM  Vitals:   07/20/23 1312  BP: 127/60  Pulse: (!) 58  Weight: 177 lb (80.3 kg)  Height: 5' (1.524 m)   Body mass index is 34.57 kg/m.  Generalized: Well developed, in no acute distress   Neurological examination  Mentation: Alert oriented to time, place, history taking. Follows all commands.  Mild expressive language with word finding issues Cranial nerve II-XII: Pupils were equal round reactive to light. Extraocular movements were full, visual field were full on confrontational test. Facial sensation and strength were normal.Head turning and shoulder shrug  were normal and symmetric. Motor: The motor testing reveals 5 over 5 strength of all 4 extremities. Good symmetric motor tone is noted throughout.  Sensory: Sensory testing is intact to soft touch on all 4 extremities. No evidence of extinction is noted.  Coordination: Cerebellar testing reveals good finger-nose-finger and heel-to-shin bilaterally.  Gait and station: Uses a walker when ambulating. Reflexes: Deep tendon reflexes are symmetric and normal bilaterally.   DIAGNOSTIC DATA (LABS,  IMAGING, TESTING) - I reviewed patient records, labs, notes, testing and imaging myself where available.  Lab Results  Component Value Date   WBC 9.4 08/11/2022   HGB 9.6 (L) 08/11/2022   HCT 30.7 (L) 08/11/2022   MCV 98 (H) 08/11/2022   PLT 305 08/11/2022      Component Value Date/Time   NA 143 08/11/2022 1205   K 4.8 08/11/2022 1205   CL 111 (H) 08/11/2022 1205   CO2 18 (L) 08/11/2022 1205   GLUCOSE 82 08/11/2022 1205   GLUCOSE 85 01/23/2022 0655   BUN 39 (H) 08/11/2022 1205   CREATININE 3.29 (H) 08/11/2022 1205   CREATININE 2.85 (H) 03/25/2021 1354   CALCIUM  9.1 08/11/2022 1205   PROT 6.3 08/11/2022 1205   ALBUMIN 3.9 08/11/2022 1205   AST 16 08/11/2022 1205   AST 14 (L) 03/25/2021 1354   ALT 23 08/11/2022 1205   ALT 12 03/25/2021 1354   ALKPHOS 123 (H) 08/11/2022 1205   BILITOT 0.3 08/11/2022 1205   BILITOT 0.5 03/25/2021 1354   GFRNONAA 14 (L) 01/23/2022 0655   GFRNONAA 16 (L) 03/25/2021 1354   GFRAA 23 (L) 02/20/2020 1615   Lab Results  Component Value Date   CHOL 122 08/11/2022   HDL 39 (L) 08/11/2022   LDLCALC 63 08/11/2022   TRIG 105 08/11/2022   CHOLHDL 3.1 08/11/2022   Lab Results  Component Value Date   HGBA1C 5.2 08/11/2022   Lab Results  Component Value Date   VITAMINB12 535 08/20/2020   Lab Results  Component Value Date   TSH 3.440 09/29/2022      ASSESSMENT AND PLAN 83 y.o. year old female  has a past medical  history of Arthritis, Cancer (HCC), Cataract, Chronic kidney disease (12/08/2009), Diabetes mellitus (03/10/2008), Dyslipidemia, Hypertension, Monoclonal gammopathy (03/10/2010), Seizures (HCC), Stroke (HCC), and Thyroid  disease. here with:  1.  Seizures  Continue Keppra  500 mg twice a day Advise if she has any stroke event she should let us  know  2.  History of stroke  Continue Eliquis  2.5 mg twice a day managed by cardiology PCP is managing stroke risk factors. Reported problems after stroke.  Suggested doing memory testing  today however the patient's daughter deferred stating that PCP had checked this.  HTN: BP goal <130/90.   HLD: LDL goal <70.  DMII: A1c goal<7.0.     Follow-up in 1 year or sooner if needed     Clem Currier, MSN, NP-C 07/20/2023, 1:38 PM [facility] [address], [address] [place], [place] [address] [phone]

## 2023-07-20 NOTE — Patient Instructions (Signed)
 Your Plan:  Continue Keppra  500 mg twice a day  Continue Eliquis  managed by cardiology Blood pressure goal <130/90 Cholesterol LDL goal <70 Diabetes goal A1c <7   Thank you for coming to see us  at [facility]. I hope we have been able to provide you high quality care today.  You may receive a patient satisfaction survey over the next few weeks. We would appreciate your feedback and comments so that we may continue to improve ourselves and the health of our patients.      Thank you for coming to see us  at [facility]. I hope we have been able to provide you high quality care today.  You may receive a patient satisfaction survey over the next few weeks. We would appreciate your feedback and comments so that we may continue to improve ourselves and the health of our patients.

## 2023-07-23 NOTE — Progress Notes (Signed)
 I agree with the above plan

## 2023-07-24 DIAGNOSIS — I2510 Atherosclerotic heart disease of native coronary artery without angina pectoris: Secondary | ICD-10-CM | POA: Diagnosis not present

## 2023-07-24 DIAGNOSIS — I10 Essential (primary) hypertension: Secondary | ICD-10-CM | POA: Diagnosis not present

## 2023-07-24 DIAGNOSIS — I639 Cerebral infarction, unspecified: Secondary | ICD-10-CM | POA: Diagnosis not present

## 2023-07-24 DIAGNOSIS — Z8679 Personal history of other diseases of the circulatory system: Secondary | ICD-10-CM | POA: Diagnosis not present

## 2023-08-07 ENCOUNTER — Other Ambulatory Visit: Payer: Self-pay | Admitting: Internal Medicine

## 2023-08-12 ENCOUNTER — Ambulatory Visit: Payer: Medicare Other | Admitting: Internal Medicine

## 2023-08-12 ENCOUNTER — Encounter: Payer: Self-pay | Admitting: Internal Medicine

## 2023-08-12 VITALS — BP 132/82 | HR 71 | Temp 97.6°F | Ht 60.0 in | Wt 174.6 lb

## 2023-08-12 DIAGNOSIS — I480 Paroxysmal atrial fibrillation: Secondary | ICD-10-CM | POA: Diagnosis not present

## 2023-08-12 DIAGNOSIS — Z0000 Encounter for general adult medical examination without abnormal findings: Secondary | ICD-10-CM

## 2023-08-12 DIAGNOSIS — I700 Atherosclerosis of aorta: Secondary | ICD-10-CM | POA: Diagnosis not present

## 2023-08-12 DIAGNOSIS — D6869 Other thrombophilia: Secondary | ICD-10-CM | POA: Diagnosis not present

## 2023-08-12 DIAGNOSIS — N390 Urinary tract infection, site not specified: Secondary | ICD-10-CM

## 2023-08-12 DIAGNOSIS — N184 Chronic kidney disease, stage 4 (severe): Secondary | ICD-10-CM

## 2023-08-12 DIAGNOSIS — I5032 Chronic diastolic (congestive) heart failure: Secondary | ICD-10-CM | POA: Diagnosis not present

## 2023-08-12 DIAGNOSIS — Z6834 Body mass index (BMI) 34.0-34.9, adult: Secondary | ICD-10-CM

## 2023-08-12 DIAGNOSIS — E1122 Type 2 diabetes mellitus with diabetic chronic kidney disease: Secondary | ICD-10-CM

## 2023-08-12 DIAGNOSIS — I130 Hypertensive heart and chronic kidney disease with heart failure and stage 1 through stage 4 chronic kidney disease, or unspecified chronic kidney disease: Secondary | ICD-10-CM

## 2023-08-12 DIAGNOSIS — H6123 Impacted cerumen, bilateral: Secondary | ICD-10-CM

## 2023-08-12 DIAGNOSIS — E039 Hypothyroidism, unspecified: Secondary | ICD-10-CM | POA: Diagnosis not present

## 2023-08-12 DIAGNOSIS — Z8673 Personal history of transient ischemic attack (TIA), and cerebral infarction without residual deficits: Secondary | ICD-10-CM

## 2023-08-12 DIAGNOSIS — R319 Hematuria, unspecified: Secondary | ICD-10-CM

## 2023-08-12 DIAGNOSIS — E6609 Other obesity due to excess calories: Secondary | ICD-10-CM

## 2023-08-12 DIAGNOSIS — E66811 Other obesity due to excess calories: Secondary | ICD-10-CM

## 2023-08-12 DIAGNOSIS — Z79899 Other long term (current) drug therapy: Secondary | ICD-10-CM

## 2023-08-12 LAB — POCT URINALYSIS DIPSTICK
Bilirubin, UA: NEGATIVE
Glucose, UA: NEGATIVE
Ketones, UA: NEGATIVE
Nitrite, UA: NEGATIVE
Protein, UA: POSITIVE — AB
Spec Grav, UA: 1.02 (ref 1.010–1.025)
Urobilinogen, UA: 0.2 U/dL
pH, UA: 7.5 (ref 5.0–8.0)

## 2023-08-12 NOTE — Patient Instructions (Signed)

## 2023-08-12 NOTE — Progress Notes (Signed)
 I,Victoria T Basil Lim, CMA,acting as a Neurosurgeon for Smiley Dung, MD.,have documented all relevant documentation on the behalf of Smiley Dung, MD,as directed by  Smiley Dung, MD while in the presence of Smiley Dung, MD.  Subjective:    Patient ID: Rita Yoder , female    DOB: 1940/08/24 , 83 y.o.   MRN: [id number]  Chief Complaint  Patient presents with   Annual Exam    Patient presents today for annual exam. Denies headaches, chest pain & sob. She is accompanied by her granddaughter.     HPI Discussed the use of AI scribe software for clinical note transcription with the patient, who gave verbal consent to proceed.  History of Present Illness Rita Yoder is an 83 year old female who presents for a physical exam and blood pressure check.    She experiences cognitive difficulties, including frustration with expressing thoughts and occasional confusion. Her daughter notes that these cognitive issues are a source of frustration for both her and the family.  She uses a walker for mobility and occasionally performs chair exercises when her sister visits weekly. She is frustrated by her limited mobility compared to her past capabilities.  She takes VESIcare, thyroid  medication, Keppra , Imdur , Eliquis , atorvastatin  40 mg, amlodipine  5 mg, and amiodarone  without issues. She does not take a diuretic.  She has visited a kidney specialist, but she is unsure of the details of the visit, as her daughter manages these appointments.  She has regular bowel movements with no blood in her stools. She dislikes the taste of water but is open to trying water with cranberry juice for flavor.  Her hip issues have improved, though she sometimes experiences discomfort when reaching up with her arms.  She has dry skin and is open to incorporating more healthy fats into her diet, such as olive oil on salads.  No tenderness when taking a deep breath, no swelling in extremities, and  possible hearing issues as she sometimes needs to speak louder.   Diabetes She presents for her follow-up diabetic visit. She has type 2 diabetes mellitus. Her disease course has been stable. There are no hypoglycemic associated symptoms. There are no diabetic associated symptoms. Diabetic complications include nephropathy. Risk factors for coronary artery disease include post-menopausal, sedentary lifestyle and hypertension. Current diabetic treatment includes diet.  Hypertension This is a chronic problem. The current episode started more than 1 year ago. The problem has been gradually improving since onset. The problem is controlled. The current treatment provides moderate improvement. Hypertensive end-organ damage includes kidney disease.     Past Medical History:  Diagnosis Date   Arthritis    Cancer [facility])    Cataract    Chronic kidney disease 12/08/2009   Diabetes mellitus 03/10/2008   Dyslipidemia    Hypertension    Monoclonal gammopathy 03/10/2010   Seizures (HCC)    Stroke (HCC)    Thyroid  disease      Family History  Problem Relation Age of Onset   Healthy Mother    Heart attack Brother    Heart Problems Maternal Grandmother    Stroke Maternal Grandmother    Cancer Maternal Aunt    Stroke Maternal Aunt    Cancer Maternal Aunt    Cancer Maternal Uncle      Current Outpatient Medications:    amiodarone  (PACERONE ) 200 MG tablet, TAKE ONE-HALF TABLET BY MOUTH  DAILY, Disp: 50 tablet, Rfl: 2   amLODipine  (NORVASC ) 5 MG tablet, TAKE 1 TABLET BY  MOUTH DAILY, Disp: 100 tablet, Rfl: 2   atorvastatin  (LIPITOR) 40 MG tablet, TAKE 1 TABLET BY MOUTH DAILY, Disp: 100 tablet, Rfl: 2   Blood Glucose Calibration (OT ULTRA/FASTTK CNTRL SOLN) SOLN, , Disp: , Rfl:    Cholecalciferol  (VITAMIN D3) 50 MCG (2000 UT) TABS, Take 2,000 Units by mouth daily., Disp: 30 tablet, Rfl: 0   ELIQUIS  2.5 MG TABS tablet, TAKE 1 TABLET BY MOUTH TWICE  DAILY, Disp: 200 tablet, Rfl: 2   isosorbide   mononitrate (IMDUR ) 60 MG 24 hr tablet, TAKE 1 TABLET BY MOUTH DAILY, Disp: 100 tablet, Rfl: 2   levETIRAcetam  (KEPPRA ) 500 MG tablet, TAKE 1 TABLET BY MOUTH TWICE  DAILY, Disp: 200 tablet, Rfl: 2   levothyroxine  (SYNTHROID ) 50 MCG tablet, TAKE 1 TABLET BY MOUTH DAILY, Disp: 100 tablet, Rfl: 2   Menthol -Methyl Salicylate  (MUSCLE RUB) 10-15 % CREA, Apply 1 Application topically as needed for muscle pain., Disp: , Rfl:    MODERNA COVID-19 BIVAL BOOSTER 50 MCG/0.5ML injection, , Disp: , Rfl:    nitroGLYCERIN  (NITROSTAT ) 0.4 MG SL tablet, DISSOLVE 1 TABLET UNDER THE TONGUE AT FIRST SIGN OF ATTACKS, MAY REPEAT EVERY 5 MINUTES UP TO 3 TABLETS, IF NO RELLIEF SEEK MEDICAL HELP (Patient taking differently: Place 0.4 mg under the tongue every 5 (five) minutes x 3 doses as needed for chest pain (SEEK HELP, IF NO RELIEF).), Disp: 25 tablet, Rfl: 1   solifenacin (VESICARE) 5 MG tablet, Take 5 mg by mouth daily., Disp: , Rfl:    cephALEXin  (KEFLEX ) 250 MG capsule, One tab po qd, Disp: 5 capsule, Rfl: 0   Allergies  Allergen Reactions   Losartan  Other (See Comments)    MD discontinued this because of decreased kidney function      The patient states she uses post menopausal status for birth control. No LMP recorded. (Menstrual status: Other).. Negative for Dysmenorrhea. Negative for: breast discharge, breast lump(s), breast pain and breast self exam. Associated symptoms include abnormal vaginal bleeding. Pertinent negatives include abnormal bleeding (hematology), anxiety, decreased libido, depression, difficulty falling sleep, dyspareunia, history of infertility, nocturia, sexual dysfunction, sleep disturbances, urinary incontinence, urinary urgency, vaginal discharge and vaginal itching. Diet regular.The patient states her exercise level is  minimal.   . The patient's tobacco use is:  Social History   Tobacco Use  Smoking Status Never  Smokeless Tobacco Never  . She has been exposed to passive smoke. The  patient's alcohol use is:  Social History   Substance and Sexual Activity  Alcohol Use No    Review of Systems  Constitutional: Negative.   HENT: Negative.    Eyes: Negative.   Respiratory: Negative.    Cardiovascular: Negative.   Gastrointestinal: Negative.   Endocrine: Negative.   Genitourinary: Negative.   Musculoskeletal: Negative.   Skin: Negative.   Allergic/Immunologic: Negative.   Neurological: Negative.        Memory changes   Hematological: Negative.   Psychiatric/Behavioral: Negative.       Today's Vitals   08/12/23 1052  BP: 132/82  Pulse: 71  Temp: 97.6 F (36.4 C)  SpO2: 98%  Weight: 174 lb 9.6 oz (79.2 kg)  Height: 5' (1.524 m)   Body mass index is 34.1 kg/m.  Wt Readings from Last 3 Encounters:  08/12/23 174 lb 9.6 oz (79.2 kg)  07/20/23 177 lb (80.3 kg)  12/15/22 185 lb 6.4 oz (84.1 kg)     Objective:  Physical Exam Vitals and nursing note reviewed.  Constitutional:  Appearance: Normal appearance. She is obese.     Comments: Examined while in chair  HENT:     Head: Normocephalic and atraumatic.     Right Ear: Ear canal and external ear normal.     Left Ear: Ear canal and external ear normal.   Eyes:     Extraocular Movements: Extraocular movements intact.    Cardiovascular:     Rate and Rhythm: Normal rate and regular rhythm.     Pulses:          Dorsalis pedis pulses are 1+ on the right side and 1+ on the left side.     Heart sounds: Normal heart sounds.  Pulmonary:     Effort: Pulmonary effort is normal.     Breath sounds: Normal breath sounds.  Chest:  Breasts:    Tanner Score is 5.     Right: Normal.     Left: Normal.     Comments: Pendulous Abdominal:     General: Bowel sounds are normal.     Palpations: Abdomen is soft.     Tenderness: There is no abdominal tenderness.     Comments: Obese, soft. Difficult to assess organomegaly  Genitourinary:    Comments: Deferred   Musculoskeletal:        General: Normal  range of motion.     Cervical back: Normal range of motion.     Right lower leg: Edema present.     Left lower leg: Edema present.     Comments: Ambulatory w/ walker  Feet:     Right foot:     Protective Sensation: 5 sites tested.  5 sites sensed.     Skin integrity: Dry skin present.     Toenail Condition: Right toenails are long.     Left foot:     Protective Sensation: 5 sites tested.  5 sites sensed.     Skin integrity: Dry skin present.     Toenail Condition: Left toenails are long.   Skin:    General: Skin is warm.   Neurological:     General: No focal deficit present.     Mental Status: She is alert.   Psychiatric:        Mood and Affect: Mood normal.        Behavior: Behavior normal.         Assessment And Plan:     Encounter for annual health examination Assessment & Plan: A full exam was performed.  Importance of monthly self breast exams was discussed with the patient.  She is advised to get 30-45 minutes of regular exercise, no less than four to five days per week. Both weight-bearing and aerobic exercises are recommended.  She is advised to follow a healthy diet with at least six fruits/veggies per day, decrease intake of red meat and other saturated fats and to increase fish intake to twice weekly.  Meats/fish should not be fried -- baked, boiled or broiled is preferable. It is also important to cut back on your sugar intake.  Be sure to read labels - try to avoid anything with added sugar, high fructose corn syrup or other sweeteners.  If you must use a sweetener, you can try stevia or monkfruit.  It is also important to avoid artificially sweetened foods/beverages and diet drinks. Lastly, wear SPF 50 sunscreen on exposed skin and when in direct sunlight for an extended period of time.  Be sure to avoid fast food restaurants and aim for at least 60  ounces of water daily.       Type 2 diabetes mellitus with stage 4 chronic kidney disease, without long-term current  use of insulin  [facility]) Assessment & Plan: Chronic, she has done a great job controlling this without meds. She is encouraged to keep up with regular eye exams.  Reminded to avoid NSAIDs, stay hydrated and keep BP well controlled to decrease risk of CKD progression. I DISCUSSED WITH THE PATIENT AT LENGTH REGARDING THE GOALS OF GLYCEMIC CONTROL AND POSSIBLE LONG-TERM COMPLICATIONS.  I  ALSO STRESSED THE IMPORTANCE OF COMPLIANCE WITH HOME GLUCOSE MONITORING, DIETARY RESTRICTIONS INCLUDING AVOIDANCE OF SUGARY DRINKS/PROCESSED FOODS,  ALONG WITH REGULAR EXERCISE.  I  ALSO STRESSED THE IMPORTANCE OF ANNUAL EYE EXAMS, SELF FOOT CARE AND COMPLIANCE WITH OFFICE VISITS.   Orders: -     POCT urinalysis dipstick -     Microalbumin / creatinine urine ratio -     EKG 12-Lead -     CBC -     CMP14+EGFR -     Lipid panel -     Hemoglobin A1c  Hypertensive heart and renal disease with congestive heart failure (HCC) Assessment & Plan: Chronic, well controlled. EKG performed, SB w/ nonspecific T abnormality. She will continue with amlodipine  5mg , Imdur , and statin therapy.  - Follow low sodium diet - Take meds as prescribed - Check renal function   Paroxysmal atrial fibrillation (HCC) Assessment & Plan: Chronic, currently rate controlled and properly anticoagulated.    Atherosclerosis of aorta (HCC) Assessment & Plan: Chronic, LDL goal is less than 70. She is currently on atorvastatin  40mg  daily. Encouraged to avoid fried foods, incorporate chair exercises into her daily routine and to aim for at least 7 hours of sleep per night.    Chronic diastolic heart failure (HCC) Assessment & Plan: Chronic, encouraged to increase her daily routine. Advised to perform chair exercises during commercials of her favorite TV show and to follow low sodium diet.    Primary hypothyroidism Assessment & Plan: Chronic, she will continue with levothyroxine  50mcg daily. I will check thyroid  panel and adjust meds as  needed.   Orders: -     TSH + free T4  Acquired thrombophilia (HCC) Assessment & Plan: Chronic, currently on Eliquis  2.5mg  twice daily due to underlying PAF.   Bilateral impacted cerumen Assessment & Plan: After obtaining verbal consent, both ears were flushed by irrigation. No TM abnormalities were noted. He tolerated procedure well without any complications.     Orders: -     Ear Lavage  Urinary tract infection with hematuria, site unspecified -     Urine Culture  Class 1 obesity due to excess calories with serious comorbidity and body mass index (BMI) of 34.0 to 34.9 in adult Assessment & Plan: She is encouraged to strive for BMI less than 30 to decrease cardiac risk. Advised to aim for at least 150 minutes of exercise per week.  - Reminded to perform chair exercises while watching TV including heel raises and arm exercises   History of CVA (cerebrovascular accident) Assessment & Plan: Experiencing cognitive difficulties with memory and expression. Discussed dietary changes to support brain health. - Encourage consumption of salmon once a week for omega-3 intake. - Incorporate healthy fats into diet, such as olive oil and nuts. - Check B12 levels for nutritional deficiencies affecting cognition.   Drug therapy -     Vitamin B12   Return in 6 months (on 02/11/2024), or bp check, for 1 year physical. Patient was  given opportunity to ask questions. Patient verbalized understanding of the plan and was able to repeat key elements of the plan. All questions were answered to their satisfaction.    I, Smiley Dung, MD, have reviewed all documentation for this visit. The documentation on 08/12/23 for the exam, diagnosis, procedures, and orders are all accurate and complete.

## 2023-08-13 LAB — TSH+FREE T4
Free T4: 1.74 ng/dL (ref 0.82–1.77)
TSH: 7.1 u[IU]/mL — ABNORMAL HIGH (ref 0.450–4.500)

## 2023-08-13 LAB — CMP14+EGFR
ALT: 19 IU/L (ref 0–32)
AST: 22 IU/L (ref 0–40)
Albumin: 4.5 g/dL (ref 3.7–4.7)
Alkaline Phosphatase: 120 IU/L (ref 44–121)
BUN/Creatinine Ratio: 16 (ref 12–28)
BUN: 61 mg/dL — ABNORMAL HIGH (ref 8–27)
Bilirubin Total: 0.3 mg/dL (ref 0.0–1.2)
CO2: 19 mmol/L — ABNORMAL LOW (ref 20–29)
Calcium: 9.8 mg/dL (ref 8.7–10.3)
Chloride: 108 mmol/L — ABNORMAL HIGH (ref 96–106)
Creatinine, Ser: 3.84 mg/dL — ABNORMAL HIGH (ref 0.57–1.00)
Globulin, Total: 2.6 g/dL (ref 1.5–4.5)
Glucose: 93 mg/dL (ref 70–99)
Potassium: 4.6 mmol/L (ref 3.5–5.2)
Sodium: 146 mmol/L — ABNORMAL HIGH (ref 134–144)
Total Protein: 7.1 g/dL (ref 6.0–8.5)
eGFR: 11 mL/min/{1.73_m2} — ABNORMAL LOW (ref >59)

## 2023-08-13 LAB — CBC
Hematocrit: 35.2 % (ref 34.0–46.6)
Hemoglobin: 11.3 g/dL (ref 11.1–15.9)
MCH: 32.8 pg (ref 26.6–33.0)
MCHC: 32.1 g/dL (ref 31.5–35.7)
MCV: 102 fL — ABNORMAL HIGH (ref 79–97)
Platelets: 194 10*3/uL (ref 150–450)
RBC: 3.44 x10E6/uL — ABNORMAL LOW (ref 3.77–5.28)
RDW: 12.4 % (ref 11.7–15.4)
WBC: 7.9 10*3/uL (ref 3.4–10.8)

## 2023-08-13 LAB — LIPID PANEL
Chol/HDL Ratio: 2.9 ratio (ref 0.0–4.4)
Cholesterol, Total: 134 mg/dL (ref 100–199)
HDL: 46 mg/dL (ref >39)
LDL Chol Calc (NIH): 68 mg/dL (ref 0–99)
Triglycerides: 108 mg/dL (ref 0–149)
VLDL Cholesterol Cal: 20 mg/dL (ref 5–40)

## 2023-08-13 LAB — MICROALBUMIN / CREATININE URINE RATIO
Creatinine, Urine: 101.9 mg/dL
Microalb/Creat Ratio: 210 mg/g{creat} — ABNORMAL HIGH (ref 0–29)
Microalbumin, Urine: 214.3 ug/mL

## 2023-08-13 LAB — HEMOGLOBIN A1C
Est. average glucose Bld gHb Est-mCnc: 88 mg/dL
Hgb A1c MFr Bld: 4.7 % — ABNORMAL LOW (ref 4.8–5.6)

## 2023-08-13 LAB — VITAMIN B12: Vitamin B-12: 677 pg/mL (ref 232–1245)

## 2023-08-15 LAB — URINE CULTURE

## 2023-08-17 ENCOUNTER — Ambulatory Visit: Payer: Self-pay | Admitting: Internal Medicine

## 2023-08-17 MED ORDER — CEPHALEXIN 250 MG PO CAPS: ORAL_CAPSULE | ORAL | 0 refills | Status: AC

## 2023-08-18 DIAGNOSIS — N182 Chronic kidney disease, stage 2 (mild): Secondary | ICD-10-CM | POA: Diagnosis not present

## 2023-08-18 DIAGNOSIS — I129 Hypertensive chronic kidney disease with stage 1 through stage 4 chronic kidney disease, or unspecified chronic kidney disease: Secondary | ICD-10-CM | POA: Diagnosis not present

## 2023-08-18 DIAGNOSIS — E872 Acidosis, unspecified: Secondary | ICD-10-CM | POA: Diagnosis not present

## 2023-08-18 DIAGNOSIS — I890 Lymphedema, not elsewhere classified: Secondary | ICD-10-CM | POA: Diagnosis not present

## 2023-08-18 DIAGNOSIS — E1122 Type 2 diabetes mellitus with diabetic chronic kidney disease: Secondary | ICD-10-CM | POA: Diagnosis not present

## 2023-08-18 DIAGNOSIS — M25559 Pain in unspecified hip: Secondary | ICD-10-CM | POA: Diagnosis not present

## 2023-08-23 DIAGNOSIS — E66811 Other obesity due to excess calories: Secondary | ICD-10-CM | POA: Insufficient documentation

## 2023-08-23 DIAGNOSIS — Z8673 Personal history of transient ischemic attack (TIA), and cerebral infarction without residual deficits: Secondary | ICD-10-CM | POA: Insufficient documentation

## 2023-08-23 NOTE — Assessment & Plan Note (Signed)
Chronic, currently on Eliquis 2.5mg  twice daily due to underlying PAF.

## 2023-08-23 NOTE — Assessment & Plan Note (Signed)
 Experiencing cognitive difficulties with memory and expression. Discussed dietary changes to support brain health. - Encourage consumption of salmon once a week for omega-3 intake. - Incorporate healthy fats into diet, such as olive oil and nuts. - Check B12 levels for nutritional deficiencies affecting cognition.

## 2023-08-23 NOTE — Assessment & Plan Note (Addendum)
 After obtaining verbal consent, both ears were flushed by irrigation. No TM abnormalities were noted. He tolerated procedure well without any complications.

## 2023-08-23 NOTE — Assessment & Plan Note (Signed)
Chronic, currently rate controlled and properly anticoagulated.

## 2023-08-23 NOTE — Assessment & Plan Note (Signed)
 Chronic, well controlled. EKG performed, SB w/ nonspecific T abnormality. She will continue with amlodipine  5mg , Imdur , and statin therapy.  - Follow low sodium diet - Take meds as prescribed - Check renal function

## 2023-08-23 NOTE — Assessment & Plan Note (Signed)
Chronic, LDL goal is less than 70. She is currently on atorvastatin 40mg  daily. Encouraged to avoid fried foods, incorporate chair exercises into her daily routine and to aim for at least 7 hours of sleep per night.

## 2023-08-23 NOTE — Assessment & Plan Note (Addendum)
 She is encouraged to strive for BMI less than 30 to decrease cardiac risk. Advised to aim for at least 150 minutes of exercise per week.  - Reminded to perform chair exercises while watching TV including heel raises and arm exercises

## 2023-08-23 NOTE — Assessment & Plan Note (Signed)
Chronic, encouraged to increase her daily routine. Advised to perform chair exercises during commercials of her favorite TV show and to follow low sodium diet.

## 2023-08-23 NOTE — Assessment & Plan Note (Signed)

## 2023-08-23 NOTE — Assessment & Plan Note (Signed)
 Chronic, she has done a great job controlling this without meds. She is encouraged to keep up with regular eye exams.  Reminded to avoid NSAIDs, stay hydrated and keep BP well controlled to decrease risk of CKD progression. I DISCUSSED WITH THE PATIENT AT LENGTH REGARDING THE GOALS OF GLYCEMIC CONTROL AND POSSIBLE LONG-TERM COMPLICATIONS.  I  ALSO STRESSED THE IMPORTANCE OF COMPLIANCE WITH HOME GLUCOSE MONITORING, DIETARY RESTRICTIONS INCLUDING AVOIDANCE OF SUGARY DRINKS/PROCESSED FOODS,  ALONG WITH REGULAR EXERCISE.  I  ALSO STRESSED THE IMPORTANCE OF ANNUAL EYE EXAMS, SELF FOOT CARE AND COMPLIANCE WITH OFFICE VISITS.

## 2023-08-23 NOTE — Assessment & Plan Note (Signed)
Chronic, she will continue with levothyroxine daily.  I will check thyroid panel and adjust meds as needed.

## 2023-09-07 DIAGNOSIS — R350 Frequency of micturition: Secondary | ICD-10-CM | POA: Diagnosis not present

## 2023-09-10 ENCOUNTER — Other Ambulatory Visit: Payer: Self-pay | Admitting: Internal Medicine

## 2023-10-14 ENCOUNTER — Other Ambulatory Visit: Payer: Self-pay | Admitting: Internal Medicine

## 2023-11-18 DIAGNOSIS — I10 Essential (primary) hypertension: Secondary | ICD-10-CM | POA: Diagnosis not present

## 2023-11-18 DIAGNOSIS — E872 Acidosis, unspecified: Secondary | ICD-10-CM | POA: Diagnosis not present

## 2023-11-18 DIAGNOSIS — M25559 Pain in unspecified hip: Secondary | ICD-10-CM | POA: Diagnosis not present

## 2023-11-18 DIAGNOSIS — E119 Type 2 diabetes mellitus without complications: Secondary | ICD-10-CM | POA: Diagnosis not present

## 2023-11-18 DIAGNOSIS — N189 Chronic kidney disease, unspecified: Secondary | ICD-10-CM | POA: Diagnosis not present

## 2024-01-17 ENCOUNTER — Other Ambulatory Visit: Payer: Self-pay | Admitting: Internal Medicine

## 2024-02-11 ENCOUNTER — Ambulatory Visit: Payer: Self-pay | Admitting: Internal Medicine

## 2024-02-11 NOTE — Progress Notes (Deleted)
 I,Darrow Barreiro T Emmitt, CMA,acting as a neurosurgeon for Rita LOISE Slocumb, MD.,have documented all relevant documentation on the behalf of Rita LOISE Slocumb, MD,as directed by  Rita LOISE Slocumb, MD while in the presence of Rita LOISE Slocumb, MD.  Subjective:  Patient ID: Rita Yoder , female    DOB: 08/24/1940 , 83 y.o.   MRN: [id number]  No chief complaint on file.   HPI  She presents today for diabetes and BP check.  She is accompanied by her son today. She reports compliance with meds. She denies headaches, chest pain and shortness of breath.   Her son would like to discuss most previous report from kidney specialist.   Letter sent to Dr Paul for DM eye exam.   Diabetes She presents for her follow-up diabetic visit. She has type 2 diabetes mellitus. Her disease course has been stable. There are no hypoglycemic associated symptoms. There are no diabetic associated symptoms. Pertinent negatives for diabetes include no polydipsia, no polyphagia and no polyuria. Diabetic complications include nephropathy. Risk factors for coronary artery disease include post-menopausal, sedentary lifestyle and hypertension. Current diabetic treatment includes diet.  Hypertension This is a chronic problem. The current episode started more than 1 year ago. The problem has been gradually improving since onset. The problem is controlled. The current treatment provides moderate improvement. Hypertensive end-organ damage includes kidney disease.     Past Medical History:  Diagnosis Date   Arthritis    Cancer (HCC)    Cataract    Chronic kidney disease 12/08/2009   Diabetes mellitus 03/10/2008   Dyslipidemia    Hypertension    Monoclonal gammopathy 03/10/2010   Seizures (HCC)    Stroke (HCC)    Thyroid  disease      Family History  Problem Relation Age of Onset   Healthy Mother    Heart attack Brother    Heart Problems Maternal Grandmother    Stroke Maternal Grandmother    Cancer Maternal Aunt     Stroke Maternal Aunt    Cancer Maternal Aunt    Cancer Maternal Uncle      Current Outpatient Medications:    amiodarone  (PACERONE ) 200 MG tablet, TAKE ONE-HALF TABLET BY MOUTH  DAILY, Disp: 50 tablet, Rfl: 2   amLODipine  (NORVASC ) 5 MG tablet, TAKE 1 TABLET BY MOUTH DAILY, Disp: 100 tablet, Rfl: 2   atorvastatin  (LIPITOR) 40 MG tablet, TAKE 1 TABLET BY MOUTH DAILY, Disp: 100 tablet, Rfl: 2   Blood Glucose Calibration (OT ULTRA/FASTTK CNTRL SOLN) SOLN, , Disp: , Rfl:    cephALEXin  (KEFLEX ) 250 MG capsule, One tab po qd, Disp: 5 capsule, Rfl: 0   Cholecalciferol  (VITAMIN D3) 50 MCG (2000 UT) TABS, Take 2,000 Units by mouth daily., Disp: 30 tablet, Rfl: 0   ELIQUIS  2.5 MG TABS tablet, TAKE 1 TABLET BY MOUTH TWICE  DAILY, Disp: 200 tablet, Rfl: 2   isosorbide  mononitrate (IMDUR ) 60 MG 24 hr tablet, TAKE 1 TABLET BY MOUTH DAILY, Disp: 100 tablet, Rfl: 2   levETIRAcetam  (KEPPRA ) 500 MG tablet, TAKE 1 TABLET BY MOUTH TWICE  DAILY, Disp: 200 tablet, Rfl: 2   levothyroxine  (SYNTHROID ) 50 MCG tablet, TAKE 1 TABLET BY MOUTH DAILY, Disp: 100 tablet, Rfl: 2   Menthol -Methyl Salicylate  (MUSCLE RUB) 10-15 % CREA, Apply 1 Application topically as needed for muscle pain., Disp: , Rfl:    MODERNA COVID-19 BIVAL BOOSTER 50 MCG/0.5ML injection, , Disp: , Rfl:    nitroGLYCERIN  (NITROSTAT ) 0.4 MG SL tablet, DISSOLVE 1 TABLET UNDER THE  TONGUE AT FIRST SIGN OF ATTACKS, MAY REPEAT EVERY 5 MINUTES UP TO 3 TABLETS, IF NO RELLIEF SEEK MEDICAL HELP (Patient taking differently: Place 0.4 mg under the tongue every 5 (five) minutes x 3 doses as needed for chest pain (SEEK HELP, IF NO RELIEF).), Disp: 25 tablet, Rfl: 1   solifenacin (VESICARE) 5 MG tablet, Take 5 mg by mouth daily., Disp: , Rfl:    Allergies  Allergen Reactions   Losartan  Other (See Comments)    MD discontinued this because of decreased kidney function     Review of Systems  Constitutional: Negative.   Respiratory: Negative.    Cardiovascular:  Negative.   Endocrine: Negative for polydipsia, polyphagia and polyuria.  Neurological: Negative.   Psychiatric/Behavioral: Negative.       There were no vitals filed for this visit. There is no height or weight on file to calculate BMI.  Wt Readings from Last 3 Encounters:  08/12/23 174 lb 9.6 oz (79.2 kg)  07/20/23 177 lb (80.3 kg)  12/15/22 185 lb 6.4 oz (84.1 kg)     Objective:  Physical Exam      Assessment And Plan:  There are no diagnoses linked to this encounter.   No follow-ups on file.  Patient was given opportunity to ask questions. Patient verbalized understanding of the plan and was able to repeat key elements of the plan. All questions were answered to their satisfaction.  Rita LOISE Slocumb, MD  I, Rita LOISE Slocumb, MD, have reviewed all documentation for this visit. The documentation on 02/11/24 for the exam, diagnosis, procedures, and orders are all accurate and complete.   IF YOU HAVE BEEN REFERRED TO A SPECIALIST, IT MAY TAKE 1-2 WEEKS TO SCHEDULE/PROCESS THE REFERRAL. IF YOU HAVE NOT HEARD FROM US /SPECIALIST IN TWO WEEKS, PLEASE GIVE US  A CALL AT [phone].   THE PATIENT IS ENCOURAGED TO PRACTICE SOCIAL DISTANCING DUE TO THE COVID-19 PANDEMIC.

## 2024-02-11 NOTE — Patient Instructions (Incomplete)

## 2024-02-15 ENCOUNTER — Encounter: Payer: Self-pay | Admitting: Internal Medicine

## 2024-03-01 ENCOUNTER — Ambulatory Visit: Payer: Self-pay

## 2024-03-02 ENCOUNTER — Ambulatory Visit: Payer: Medicare Other | Admitting: Family Medicine

## 2024-03-02 ENCOUNTER — Ambulatory Visit: Payer: Medicare Other

## 2024-08-04 ENCOUNTER — Ambulatory Visit: Admitting: Adult Health

## 2024-08-15 ENCOUNTER — Encounter: Payer: Self-pay | Admitting: Internal Medicine
